# Patient Record
Sex: Male | Born: 1935 | Race: Black or African American | Hispanic: No | Marital: Married | Smoking: Former smoker
Health system: Southern US, Community
[De-identification: ages and names within clinical notes are randomized; demographics above are authoritative.]

## PROBLEM LIST (undated history)

## (undated) DIAGNOSIS — R531 Weakness: Secondary | ICD-10-CM

## (undated) DIAGNOSIS — M5137 Other intervertebral disc degeneration, lumbosacral region: Secondary | ICD-10-CM

## (undated) DIAGNOSIS — Z794 Long term (current) use of insulin: Secondary | ICD-10-CM

## (undated) DIAGNOSIS — H3500 Unspecified background retinopathy: Secondary | ICD-10-CM

## (undated) DIAGNOSIS — E119 Type 2 diabetes mellitus without complications: Secondary | ICD-10-CM

## (undated) DIAGNOSIS — M1990 Unspecified osteoarthritis, unspecified site: Secondary | ICD-10-CM

## (undated) DIAGNOSIS — I5022 Chronic systolic (congestive) heart failure: Secondary | ICD-10-CM

## (undated) DIAGNOSIS — M542 Cervicalgia: Secondary | ICD-10-CM

## (undated) DIAGNOSIS — J189 Pneumonia, unspecified organism: Secondary | ICD-10-CM

## (undated) DIAGNOSIS — I82409 Acute embolism and thrombosis of unspecified deep veins of unspecified lower extremity: Secondary | ICD-10-CM

## (undated) DIAGNOSIS — E7800 Pure hypercholesterolemia, unspecified: Secondary | ICD-10-CM

## (undated) DIAGNOSIS — F03B0 Unspecified dementia, moderate, without behavioral disturbance, psychotic disturbance, mood disturbance, and anxiety: Secondary | ICD-10-CM

## (undated) DIAGNOSIS — F0390 Unspecified dementia without behavioral disturbance: Secondary | ICD-10-CM

## (undated) DIAGNOSIS — I739 Peripheral vascular disease, unspecified: Secondary | ICD-10-CM

## (undated) DIAGNOSIS — I509 Heart failure, unspecified: Secondary | ICD-10-CM

## (undated) DIAGNOSIS — M51379 Other intervertebral disc degeneration, lumbosacral region without mention of lumbar back pain or lower extremity pain: Secondary | ICD-10-CM

## (undated) DIAGNOSIS — I10 Essential (primary) hypertension: Secondary | ICD-10-CM

## (undated) DIAGNOSIS — N189 Chronic kidney disease, unspecified: Secondary | ICD-10-CM

## (undated) DIAGNOSIS — H409 Unspecified glaucoma: Secondary | ICD-10-CM

## (undated) DIAGNOSIS — M48061 Spinal stenosis, lumbar region without neurogenic claudication: Secondary | ICD-10-CM

## (undated) DIAGNOSIS — M4800 Spinal stenosis, site unspecified: Secondary | ICD-10-CM

## (undated) DIAGNOSIS — G629 Polyneuropathy, unspecified: Secondary | ICD-10-CM

## (undated) HISTORY — DX: Heart failure, unspecified: I50.9

## (undated) HISTORY — DX: Pneumonia, unspecified organism: J18.9

## (undated) HISTORY — DX: Type 2 diabetes mellitus without complications: E11.9

## (undated) HISTORY — PX: OTHER SURGICAL HISTORY: SHX169

## (undated) HISTORY — DX: Pure hypercholesterolemia, unspecified: E78.00

## (undated) HISTORY — DX: Unspecified dementia, moderate, without behavioral disturbance, psychotic disturbance, mood disturbance, and anxiety: F03.B0

## (undated) HISTORY — DX: Spinal stenosis, lumbar region without neurogenic claudication: M48.061

## (undated) HISTORY — DX: Essential (primary) hypertension: I10

## (undated) HISTORY — DX: Unspecified dementia without behavioral disturbance: F03.90

---

## 1994-02-10 DIAGNOSIS — E119 Type 2 diabetes mellitus without complications: Secondary | ICD-10-CM

## 1994-02-10 HISTORY — DX: Type 2 diabetes mellitus without complications: E11.9

## 1997-02-10 HISTORY — PX: ANGIOPLASTY / STENTING FEMORAL: SUR30

## 2004-05-23 ENCOUNTER — Ambulatory Visit: Payer: Self-pay | Admitting: Internal Medicine

## 2004-05-23 HISTORY — PX: COLONOSCOPY: SHX174

## 2004-09-10 ENCOUNTER — Ambulatory Visit: Payer: Self-pay | Admitting: Otolaryngology

## 2004-11-21 ENCOUNTER — Ambulatory Visit: Payer: Self-pay | Admitting: Internal Medicine

## 2006-04-16 ENCOUNTER — Other Ambulatory Visit: Payer: Self-pay

## 2006-04-16 ENCOUNTER — Emergency Department: Payer: Self-pay | Admitting: Emergency Medicine

## 2008-12-08 ENCOUNTER — Ambulatory Visit: Payer: Self-pay | Admitting: Otolaryngology

## 2009-06-08 ENCOUNTER — Ambulatory Visit: Payer: Self-pay | Admitting: Neurology

## 2009-07-06 ENCOUNTER — Ambulatory Visit (HOSPITAL_COMMUNITY): Admission: RE | Admit: 2009-07-06 | Discharge: 2009-07-07 | Payer: Self-pay | Admitting: Neurosurgery

## 2009-07-11 HISTORY — PX: ANTERIOR FUSION CERVICAL SPINE: SUR626

## 2010-04-29 LAB — GLUCOSE, CAPILLARY
Glucose-Capillary: 129 mg/dL — ABNORMAL HIGH (ref 70–99)
Glucose-Capillary: 171 mg/dL — ABNORMAL HIGH (ref 70–99)
Glucose-Capillary: 66 mg/dL — ABNORMAL LOW (ref 70–99)

## 2010-04-29 LAB — SURGICAL PCR SCREEN
MRSA, PCR: NEGATIVE
Staphylococcus aureus: NEGATIVE

## 2010-04-29 LAB — CBC
HCT: 34.7 % — ABNORMAL LOW (ref 39.0–52.0)
Hemoglobin: 12.2 g/dL — ABNORMAL LOW (ref 13.0–17.0)
MCV: 91.8 fL (ref 78.0–100.0)
RDW: 14.2 % (ref 11.5–15.5)

## 2010-04-29 LAB — BASIC METABOLIC PANEL
BUN: 22 mg/dL (ref 6–23)
Calcium: 9.2 mg/dL (ref 8.4–10.5)
Creatinine, Ser: 1.68 mg/dL — ABNORMAL HIGH (ref 0.4–1.5)
GFR calc non Af Amer: 40 mL/min — ABNORMAL LOW (ref >60)
Sodium: 141 mEq/L (ref 135–145)

## 2010-07-04 ENCOUNTER — Encounter: Payer: Self-pay | Admitting: Neurology

## 2010-07-12 ENCOUNTER — Encounter: Payer: Self-pay | Admitting: Neurology

## 2010-08-11 ENCOUNTER — Encounter: Payer: Self-pay | Admitting: Neurology

## 2011-01-15 ENCOUNTER — Ambulatory Visit: Payer: Self-pay | Admitting: Neurology

## 2011-03-12 ENCOUNTER — Ambulatory Visit: Payer: Self-pay | Admitting: Family Medicine

## 2012-03-19 ENCOUNTER — Emergency Department: Payer: Self-pay | Admitting: Emergency Medicine

## 2012-03-19 LAB — CBC WITH DIFFERENTIAL/PLATELET
Basophil #: 0.1 10*3/uL (ref 0.0–0.1)
Eosinophil #: 0.1 10*3/uL (ref 0.0–0.7)
Eosinophil %: 1 %
Lymphocyte %: 25.7 %
MCHC: 34.3 g/dL (ref 32.0–36.0)
Neutrophil #: 4.3 10*3/uL (ref 1.4–6.5)
Neutrophil %: 64.3 %
RBC: 4.17 10*6/uL — ABNORMAL LOW (ref 4.40–5.90)

## 2012-03-19 LAB — URINALYSIS, COMPLETE
Bacteria: NONE SEEN
Bilirubin,UR: NEGATIVE
Blood: NEGATIVE
Glucose,UR: 50 mg/dL (ref 0–75)
Ketone: NEGATIVE
Leukocyte Esterase: NEGATIVE
Ph: 6 (ref 4.5–8.0)
RBC,UR: 1 /HPF (ref 0–5)

## 2012-03-19 LAB — TROPONIN I: Troponin-I: <0.02 ng/mL

## 2012-03-19 LAB — COMPREHENSIVE METABOLIC PANEL
Albumin: 3.4 g/dL (ref 3.4–5.0)
Alkaline Phosphatase: 98 U/L (ref 50–136)
Anion Gap: 7 (ref 7–16)
BUN: 26 mg/dL — ABNORMAL HIGH (ref 7–18)
Bilirubin,Total: 0.3 mg/dL (ref 0.2–1.0)
Chloride: 106 mmol/L (ref 98–107)
Creatinine: 1.79 mg/dL — ABNORMAL HIGH (ref 0.60–1.30)
EGFR (Non-African Amer.): 36 — ABNORMAL LOW
Osmolality: 284 (ref 275–301)
SGOT(AST): 47 U/L — ABNORMAL HIGH (ref 15–37)
SGPT (ALT): 67 U/L (ref 12–78)

## 2012-04-20 ENCOUNTER — Emergency Department: Payer: Self-pay | Admitting: Emergency Medicine

## 2012-04-20 LAB — URINALYSIS, COMPLETE
Bilirubin,UR: NEGATIVE
Glucose,UR: 150 mg/dL (ref 0–75)
Leukocyte Esterase: NEGATIVE
Specific Gravity: 1.021 (ref 1.003–1.030)
Squamous Epithelial: NONE SEEN

## 2012-04-20 LAB — COMPREHENSIVE METABOLIC PANEL
Bilirubin,Total: 0.4 mg/dL (ref 0.2–1.0)
Creatinine: 1.51 mg/dL — ABNORMAL HIGH (ref 0.60–1.30)
Glucose: 154 mg/dL — ABNORMAL HIGH (ref 65–99)
Sodium: 140 mmol/L (ref 136–145)
Total Protein: 7.3 g/dL (ref 6.4–8.2)

## 2012-04-20 LAB — TROPONIN I: Troponin-I: <0.02 ng/mL

## 2012-04-20 LAB — CBC
HGB: 12.6 g/dL — ABNORMAL LOW (ref 13.0–18.0)
Platelet: 129 10*3/uL — ABNORMAL LOW (ref 150–440)
RBC: 4.07 10*6/uL — ABNORMAL LOW (ref 4.40–5.90)

## 2012-04-20 LAB — TSH: Thyroid Stimulating Horm: 1.66 u[IU]/mL

## 2012-04-21 LAB — BASIC METABOLIC PANEL
BUN: 22 mg/dL — ABNORMAL HIGH (ref 7–18)
Co2: 25 mmol/L (ref 21–32)
Creatinine: 1.2 mg/dL (ref 0.60–1.30)
EGFR (Non-African Amer.): 58 — ABNORMAL LOW
Glucose: 36 mg/dL — CL (ref 65–99)
Osmolality: 276 (ref 275–301)
Potassium: 3.8 mmol/L (ref 3.5–5.1)

## 2012-04-21 LAB — CBC WITH DIFFERENTIAL/PLATELET
Basophil %: 0.2 %
Eosinophil #: 0 10*3/uL (ref 0.0–0.7)
Eosinophil %: 0.1 %
HCT: 38.6 % — ABNORMAL LOW (ref 40.0–52.0)
HGB: 13.3 g/dL (ref 13.0–18.0)
Lymphocyte #: 1.1 10*3/uL (ref 1.0–3.6)
Lymphocyte %: 9.5 %
MCV: 91 fL (ref 80–100)
Neutrophil %: 83.1 %
Platelet: 123 10*3/uL — ABNORMAL LOW (ref 150–440)
RBC: 4.24 10*6/uL — ABNORMAL LOW (ref 4.40–5.90)
RDW: 14.5 % (ref 11.5–14.5)
WBC: 11.7 10*3/uL — ABNORMAL HIGH (ref 3.8–10.6)

## 2012-04-22 ENCOUNTER — Inpatient Hospital Stay: Payer: Self-pay | Admitting: Family Medicine

## 2012-04-22 LAB — CBC WITH DIFFERENTIAL/PLATELET
Basophil %: 0.1 %
Eosinophil #: 0 10*3/uL (ref 0.0–0.7)
HGB: 10.9 g/dL — ABNORMAL LOW (ref 13.0–18.0)
Lymphocyte %: 13.6 %
MCV: 91 fL (ref 80–100)
Monocyte #: 0.7 x10 3/mm (ref 0.2–1.0)
Neutrophil #: 6.1 10*3/uL (ref 1.4–6.5)
Platelet: 100 10*3/uL — ABNORMAL LOW (ref 150–440)
WBC: 7.9 10*3/uL (ref 3.8–10.6)

## 2012-04-22 LAB — BASIC METABOLIC PANEL
BUN: 31 mg/dL — ABNORMAL HIGH (ref 7–18)
Calcium, Total: 8.2 mg/dL — ABNORMAL LOW (ref 8.5–10.1)
EGFR (African American): 53 — ABNORMAL LOW
EGFR (Non-African Amer.): 46 — ABNORMAL LOW
Glucose: 75 mg/dL (ref 65–99)
Osmolality: 286 (ref 275–301)

## 2012-04-23 LAB — CBC WITH DIFFERENTIAL/PLATELET
Basophil #: 0 10*3/uL (ref 0.0–0.1)
HCT: 39.7 % — ABNORMAL LOW (ref 40.0–52.0)
Lymphocyte #: 0.4 10*3/uL — ABNORMAL LOW (ref 1.0–3.6)
MCH: 31.3 pg (ref 26.0–34.0)
Monocyte #: 0.3 x10 3/mm (ref 0.2–1.0)
Neutrophil %: 84.8 %
Platelet: 134 10*3/uL — ABNORMAL LOW (ref 150–440)
RDW: 14.5 % (ref 11.5–14.5)
WBC: 5 10*3/uL (ref 3.8–10.6)

## 2012-04-23 LAB — BASIC METABOLIC PANEL
Anion Gap: 7 (ref 7–16)
BUN: 22 mg/dL — ABNORMAL HIGH (ref 7–18)
Creatinine: 1.25 mg/dL (ref 0.60–1.30)
EGFR (Non-African Amer.): 55 — ABNORMAL LOW
Glucose: 168 mg/dL — ABNORMAL HIGH (ref 65–99)
Sodium: 139 mmol/L (ref 136–145)

## 2012-04-24 LAB — CBC WITH DIFFERENTIAL/PLATELET
Eosinophil %: 0 %
Lymphocyte %: 7.9 %
MCH: 30.8 pg (ref 26.0–34.0)
Monocyte %: 7.1 %
Neutrophil #: 12.6 10*3/uL — ABNORMAL HIGH (ref 1.4–6.5)
Platelet: 147 10*3/uL — ABNORMAL LOW (ref 150–440)
WBC: 14.8 10*3/uL — ABNORMAL HIGH (ref 3.8–10.6)

## 2012-04-24 LAB — BASIC METABOLIC PANEL
Anion Gap: 5 — ABNORMAL LOW (ref 7–16)
BUN: 37 mg/dL — ABNORMAL HIGH (ref 7–18)
Calcium, Total: 8.5 mg/dL (ref 8.5–10.1)
Chloride: 107 mmol/L (ref 98–107)
Co2: 27 mmol/L (ref 21–32)
Creatinine: 1.69 mg/dL — ABNORMAL HIGH (ref 0.60–1.30)
EGFR (African American): 44 — ABNORMAL LOW
EGFR (Non-African Amer.): 38 — ABNORMAL LOW

## 2012-04-25 LAB — CBC WITH DIFFERENTIAL/PLATELET
Basophil #: 0.1 10*3/uL (ref 0.0–0.1)
Basophil %: 0.4 %
Lymphocyte #: 1 10*3/uL (ref 1.0–3.6)
Lymphocyte %: 6.1 %
MCHC: 34 g/dL (ref 32.0–36.0)
Monocyte #: 0.9 x10 3/mm (ref 0.2–1.0)
Monocyte %: 5.6 %
Neutrophil #: 13.9 10*3/uL — ABNORMAL HIGH (ref 1.4–6.5)
Platelet: 184 10*3/uL (ref 150–440)
WBC: 15.9 10*3/uL — ABNORMAL HIGH (ref 3.8–10.6)

## 2012-04-25 LAB — BASIC METABOLIC PANEL
Anion Gap: 6 — ABNORMAL LOW (ref 7–16)
Calcium, Total: 9 mg/dL (ref 8.5–10.1)
Chloride: 106 mmol/L (ref 98–107)
Co2: 26 mmol/L (ref 21–32)
EGFR (Non-African Amer.): 45 — ABNORMAL LOW
Glucose: 185 mg/dL — ABNORMAL HIGH (ref 65–99)
Potassium: 3.6 mmol/L (ref 3.5–5.1)

## 2012-04-26 LAB — CULTURE, BLOOD (SINGLE)

## 2012-04-27 LAB — BASIC METABOLIC PANEL
Calcium, Total: 8.8 mg/dL (ref 8.5–10.1)
Co2: 26 mmol/L (ref 21–32)
EGFR (African American): 52 — ABNORMAL LOW
EGFR (Non-African Amer.): 45 — ABNORMAL LOW
Potassium: 3.3 mmol/L — ABNORMAL LOW (ref 3.5–5.1)
Sodium: 143 mmol/L (ref 136–145)

## 2012-04-27 LAB — CBC WITH DIFFERENTIAL/PLATELET
Eosinophil #: 0.1 10*3/uL (ref 0.0–0.7)
HCT: 30.6 % — ABNORMAL LOW (ref 40.0–52.0)
HGB: 9.7 g/dL — ABNORMAL LOW (ref 13.0–18.0)
Lymphocyte #: 1.7 10*3/uL (ref 1.0–3.6)
Lymphocyte %: 16.7 %
Monocyte #: 1.3 x10 3/mm — ABNORMAL HIGH (ref 0.2–1.0)
Monocyte %: 12.2 %
Neutrophil %: 69.8 %
Platelet: 250 10*3/uL (ref 150–440)

## 2012-04-28 LAB — BASIC METABOLIC PANEL
Anion Gap: 9 (ref 7–16)
BUN: 32 mg/dL — ABNORMAL HIGH (ref 7–18)
Chloride: 107 mmol/L (ref 98–107)
Co2: 25 mmol/L (ref 21–32)
Creatinine: 1.39 mg/dL — ABNORMAL HIGH (ref 0.60–1.30)
Glucose: 88 mg/dL (ref 65–99)
Osmolality: 288 (ref 275–301)
Potassium: 3.2 mmol/L — ABNORMAL LOW (ref 3.5–5.1)
Sodium: 141 mmol/L (ref 136–145)

## 2012-04-28 LAB — CBC WITH DIFFERENTIAL/PLATELET
Basophil #: 0.1 10*3/uL (ref 0.0–0.1)
Basophil %: 0.5 %
HGB: 11 g/dL — ABNORMAL LOW (ref 13.0–18.0)
Monocyte %: 9.5 %
Platelet: 285 10*3/uL (ref 150–440)
RBC: 3.45 10*6/uL — ABNORMAL LOW (ref 4.40–5.90)
RDW: 14.5 % (ref 11.5–14.5)

## 2012-04-28 LAB — URINE CULTURE

## 2012-04-29 ENCOUNTER — Encounter: Payer: Self-pay | Admitting: Internal Medicine

## 2012-05-11 ENCOUNTER — Encounter: Payer: Self-pay | Admitting: Internal Medicine

## 2013-03-16 ENCOUNTER — Ambulatory Visit: Payer: Self-pay | Admitting: Ophthalmology

## 2013-04-20 ENCOUNTER — Ambulatory Visit: Payer: Self-pay | Admitting: Ophthalmology

## 2013-06-17 ENCOUNTER — Ambulatory Visit: Payer: Self-pay

## 2013-07-26 ENCOUNTER — Ambulatory Visit: Payer: Self-pay

## 2013-08-10 ENCOUNTER — Ambulatory Visit: Payer: Self-pay

## 2013-09-10 ENCOUNTER — Inpatient Hospital Stay: Payer: Self-pay | Admitting: Family Medicine

## 2013-09-10 LAB — URINALYSIS, COMPLETE
Bacteria: NONE SEEN
Bilirubin,UR: NEGATIVE
Glucose,UR: >=500 mg/dL (ref 0–75)
Ketone: NEGATIVE
LEUKOCYTE ESTERASE: NEGATIVE
NITRITE: NEGATIVE
PH: 5 (ref 4.5–8.0)
Protein: 100
Specific Gravity: 1.016 (ref 1.003–1.030)
Squamous Epithelial: NONE SEEN

## 2013-09-10 LAB — CK TOTAL AND CKMB (NOT AT ARMC)
CK, Total: 181 U/L
CK-MB: 1.7 ng/mL (ref 0.5–3.6)

## 2013-09-10 LAB — COMPREHENSIVE METABOLIC PANEL
ANION GAP: 4 — AB (ref 7–16)
Albumin: 3.1 g/dL — ABNORMAL LOW (ref 3.4–5.0)
Alkaline Phosphatase: 71 U/L
BUN: 25 mg/dL — AB (ref 7–18)
Bilirubin,Total: 0.4 mg/dL (ref 0.2–1.0)
CO2: 26 mmol/L (ref 21–32)
CREATININE: 2.11 mg/dL — AB (ref 0.60–1.30)
Calcium, Total: 9.1 mg/dL (ref 8.5–10.1)
Chloride: 107 mmol/L (ref 98–107)
EGFR (African American): 33 — ABNORMAL LOW
GFR CALC NON AF AMER: 29 — AB
Glucose: 277 mg/dL — ABNORMAL HIGH (ref 65–99)
Osmolality: 288 (ref 275–301)
Potassium: 5 mmol/L (ref 3.5–5.1)
SGOT(AST): 34 U/L (ref 15–37)
SGPT (ALT): 35 U/L
Sodium: 137 mmol/L (ref 136–145)
TOTAL PROTEIN: 7.4 g/dL (ref 6.4–8.2)

## 2013-09-10 LAB — CBC
HCT: 35.3 % — ABNORMAL LOW (ref 40.0–52.0)
HGB: 11.8 g/dL — AB (ref 13.0–18.0)
MCH: 31.2 pg (ref 26.0–34.0)
MCHC: 33.3 g/dL (ref 32.0–36.0)
MCV: 94 fL (ref 80–100)
Platelet: 186 10*3/uL (ref 150–440)
RBC: 3.77 10*6/uL — ABNORMAL LOW (ref 4.40–5.90)
RDW: 14.2 % (ref 11.5–14.5)
WBC: 8.7 10*3/uL (ref 3.8–10.6)

## 2013-09-10 LAB — PRO B NATRIURETIC PEPTIDE: B-TYPE NATIURETIC PEPTID: 229 pg/mL (ref 0–450)

## 2013-09-10 LAB — PROTIME-INR
INR: 2.3
Prothrombin Time: 24.4 secs — ABNORMAL HIGH (ref 11.5–14.7)

## 2013-09-10 LAB — PHOSPHORUS: PHOSPHORUS: 2 mg/dL — AB (ref 2.5–4.9)

## 2013-09-10 LAB — TROPONIN I: Troponin-I: <0.02 ng/mL

## 2013-09-10 LAB — MAGNESIUM: Magnesium: 2 mg/dL

## 2013-09-11 LAB — BASIC METABOLIC PANEL
ANION GAP: 8 (ref 7–16)
BUN: 29 mg/dL — ABNORMAL HIGH (ref 7–18)
CALCIUM: 8 mg/dL — AB (ref 8.5–10.1)
CO2: 24 mmol/L (ref 21–32)
Chloride: 106 mmol/L (ref 98–107)
Creatinine: 2 mg/dL — ABNORMAL HIGH (ref 0.60–1.30)
EGFR (African American): 35 — ABNORMAL LOW
GFR CALC NON AF AMER: 31 — AB
Glucose: 257 mg/dL — ABNORMAL HIGH (ref 65–99)
Osmolality: 290 (ref 275–301)
Potassium: 4.6 mmol/L (ref 3.5–5.1)
SODIUM: 138 mmol/L (ref 136–145)

## 2013-09-11 LAB — CBC WITH DIFFERENTIAL/PLATELET
BASOS PCT: 0.3 %
Basophil #: 0 10*3/uL (ref 0.0–0.1)
Eosinophil #: 0 10*3/uL (ref 0.0–0.7)
Eosinophil %: 0 %
HCT: 29.6 % — AB (ref 40.0–52.0)
HGB: 10.1 g/dL — AB (ref 13.0–18.0)
LYMPHS ABS: 0.6 10*3/uL — AB (ref 1.0–3.6)
LYMPHS PCT: 6.7 %
MCH: 31.8 pg (ref 26.0–34.0)
MCHC: 34 g/dL (ref 32.0–36.0)
MCV: 94 fL (ref 80–100)
MONO ABS: 0.7 x10 3/mm (ref 0.2–1.0)
MONOS PCT: 7.8 %
NEUTROS PCT: 85.2 %
Neutrophil #: 7.5 10*3/uL — ABNORMAL HIGH (ref 1.4–6.5)
Platelet: 146 10*3/uL — ABNORMAL LOW (ref 150–440)
RBC: 3.16 10*6/uL — ABNORMAL LOW (ref 4.40–5.90)
RDW: 14.3 % (ref 11.5–14.5)
WBC: 8.8 10*3/uL (ref 3.8–10.6)

## 2013-09-11 LAB — PROTIME-INR
INR: 1.7
Prothrombin Time: 19.8 secs — ABNORMAL HIGH (ref 11.5–14.7)

## 2013-09-12 LAB — CBC WITH DIFFERENTIAL/PLATELET
Basophil #: 0 10*3/uL (ref 0.0–0.1)
Basophil %: 0.4 %
Eosinophil #: 0.1 10*3/uL (ref 0.0–0.7)
Eosinophil %: 0.5 %
HCT: 29.5 % — AB (ref 40.0–52.0)
HGB: 10 g/dL — ABNORMAL LOW (ref 13.0–18.0)
Lymphocyte #: 1.1 10*3/uL (ref 1.0–3.6)
Lymphocyte %: 9.9 %
MCH: 31.4 pg (ref 26.0–34.0)
MCHC: 34 g/dL (ref 32.0–36.0)
MCV: 93 fL (ref 80–100)
Monocyte #: 0.9 x10 3/mm (ref 0.2–1.0)
Monocyte %: 7.8 %
NEUTROS ABS: 9.2 10*3/uL — AB (ref 1.4–6.5)
Neutrophil %: 81.4 %
Platelet: 153 10*3/uL (ref 150–440)
RBC: 3.19 10*6/uL — ABNORMAL LOW (ref 4.40–5.90)
RDW: 14.4 % (ref 11.5–14.5)
WBC: 11.4 10*3/uL — ABNORMAL HIGH (ref 3.8–10.6)

## 2013-09-12 LAB — BASIC METABOLIC PANEL
ANION GAP: 5 — AB (ref 7–16)
BUN: 27 mg/dL — ABNORMAL HIGH (ref 7–18)
CALCIUM: 8.5 mg/dL (ref 8.5–10.1)
CHLORIDE: 110 mmol/L — AB (ref 98–107)
CREATININE: 1.8 mg/dL — AB (ref 0.60–1.30)
Co2: 26 mmol/L (ref 21–32)
EGFR (African American): 40 — ABNORMAL LOW
GFR CALC NON AF AMER: 35 — AB
GLUCOSE: 41 mg/dL — AB (ref 65–99)
Osmolality: 283 (ref 275–301)
Potassium: 3.7 mmol/L (ref 3.5–5.1)
SODIUM: 141 mmol/L (ref 136–145)

## 2013-09-12 LAB — URINE CULTURE

## 2013-09-12 LAB — PROTIME-INR
INR: 2.1
Prothrombin Time: 23.3 secs — ABNORMAL HIGH (ref 11.5–14.7)

## 2013-09-13 LAB — PROTIME-INR
INR: 1.7
PROTHROMBIN TIME: 19.3 s — AB (ref 11.5–14.7)

## 2013-09-14 LAB — BASIC METABOLIC PANEL
Anion Gap: 9 (ref 7–16)
BUN: 30 mg/dL — AB (ref 7–18)
CALCIUM: 8.8 mg/dL (ref 8.5–10.1)
Chloride: 105 mmol/L (ref 98–107)
Co2: 28 mmol/L (ref 21–32)
Creatinine: 1.71 mg/dL — ABNORMAL HIGH (ref 0.60–1.30)
EGFR (African American): 43 — ABNORMAL LOW
GFR CALC NON AF AMER: 37 — AB
Glucose: 69 mg/dL (ref 65–99)
Osmolality: 288 (ref 275–301)
Potassium: 3.1 mmol/L — ABNORMAL LOW (ref 3.5–5.1)
SODIUM: 142 mmol/L (ref 136–145)

## 2013-09-15 LAB — BASIC METABOLIC PANEL
ANION GAP: 6 — AB (ref 7–16)
BUN: 37 mg/dL — ABNORMAL HIGH (ref 7–18)
Calcium, Total: 8.7 mg/dL (ref 8.5–10.1)
Chloride: 106 mmol/L (ref 98–107)
Co2: 29 mmol/L (ref 21–32)
Creatinine: 1.8 mg/dL — ABNORMAL HIGH (ref 0.60–1.30)
EGFR (African American): 40 — ABNORMAL LOW
GFR CALC NON AF AMER: 35 — AB
Glucose: 189 mg/dL — ABNORMAL HIGH (ref 65–99)
OSMOLALITY: 295 (ref 275–301)
Potassium: 3.3 mmol/L — ABNORMAL LOW (ref 3.5–5.1)
SODIUM: 141 mmol/L (ref 136–145)

## 2013-09-15 LAB — CULTURE, BLOOD (SINGLE)

## 2013-09-15 LAB — EXPECTORATED SPUTUM ASSESSMENT W REFEX TO RESP CULTURE

## 2013-09-16 LAB — HEMOGLOBIN: HGB: 11.1 g/dL — ABNORMAL LOW (ref 13.0–18.0)

## 2013-09-24 ENCOUNTER — Emergency Department: Payer: Self-pay | Admitting: Emergency Medicine

## 2013-09-24 LAB — URINALYSIS, COMPLETE
BACTERIA: NONE SEEN
Bilirubin,UR: NEGATIVE
Glucose,UR: NEGATIVE mg/dL (ref 0–75)
Hyaline Cast: 2
KETONE: NEGATIVE
LEUKOCYTE ESTERASE: NEGATIVE
NITRITE: NEGATIVE
Ph: 5 (ref 4.5–8.0)
RBC,UR: 2 /HPF (ref 0–5)
SQUAMOUS EPITHELIAL: NONE SEEN
Specific Gravity: 1.013 (ref 1.003–1.030)
WBC UR: 1 /HPF (ref 0–5)

## 2013-09-24 LAB — COMPREHENSIVE METABOLIC PANEL
ALBUMIN: 3 g/dL — AB (ref 3.4–5.0)
ALK PHOS: 71 U/L
AST: 24 U/L (ref 15–37)
Anion Gap: 6 — ABNORMAL LOW (ref 7–16)
BILIRUBIN TOTAL: 0.6 mg/dL (ref 0.2–1.0)
BUN: 33 mg/dL — ABNORMAL HIGH (ref 7–18)
CHLORIDE: 104 mmol/L (ref 98–107)
CREATININE: 1.76 mg/dL — AB (ref 0.60–1.30)
Calcium, Total: 8.7 mg/dL (ref 8.5–10.1)
Co2: 28 mmol/L (ref 21–32)
EGFR (African American): 41 — ABNORMAL LOW
EGFR (Non-African Amer.): 36 — ABNORMAL LOW
Glucose: 149 mg/dL — ABNORMAL HIGH (ref 65–99)
Osmolality: 286 (ref 275–301)
Potassium: 3.8 mmol/L (ref 3.5–5.1)
SGPT (ALT): 31 U/L
SODIUM: 138 mmol/L (ref 136–145)
Total Protein: 7.6 g/dL (ref 6.4–8.2)

## 2013-09-24 LAB — TROPONIN I: Troponin-I: 0.02 ng/mL

## 2013-09-24 LAB — CBC
HCT: 42.4 % (ref 40.0–52.0)
HGB: 13.9 g/dL (ref 13.0–18.0)
MCH: 30.6 pg (ref 26.0–34.0)
MCHC: 32.7 g/dL (ref 32.0–36.0)
MCV: 94 fL (ref 80–100)
PLATELETS: 391 10*3/uL (ref 150–440)
RBC: 4.53 10*6/uL (ref 4.40–5.90)
RDW: 14.3 % (ref 11.5–14.5)
WBC: 10.5 10*3/uL (ref 3.8–10.6)

## 2014-02-22 ENCOUNTER — Inpatient Hospital Stay: Payer: Self-pay | Admitting: Internal Medicine

## 2014-02-22 LAB — URINALYSIS, COMPLETE
BACTERIA: NONE SEEN
BILIRUBIN, UR: NEGATIVE
Blood: NEGATIVE
Ketone: NEGATIVE
Leukocyte Esterase: NEGATIVE
NITRITE: NEGATIVE
Ph: 5 (ref 4.5–8.0)
Protein: 100
RBC,UR: 1 /HPF (ref 0–5)
SQUAMOUS EPITHELIAL: NONE SEEN
Specific Gravity: 1.017 (ref 1.003–1.030)

## 2014-02-22 LAB — CBC WITH DIFFERENTIAL/PLATELET
BASOS ABS: 0 10*3/uL (ref 0.0–0.1)
BASOS PCT: 0.2 %
EOS ABS: 0 10*3/uL (ref 0.0–0.7)
EOS PCT: 0.1 %
HCT: 34.4 % — AB (ref 40.0–52.0)
HGB: 11.2 g/dL — AB (ref 13.0–18.0)
Lymphocyte #: 0.6 10*3/uL — ABNORMAL LOW (ref 1.0–3.6)
Lymphocyte %: 4.1 %
MCH: 30.1 pg (ref 26.0–34.0)
MCHC: 32.6 g/dL (ref 32.0–36.0)
MCV: 92 fL (ref 80–100)
MONOS PCT: 8.4 %
Monocyte #: 1.2 x10 3/mm — ABNORMAL HIGH (ref 0.2–1.0)
Neutrophil #: 12.3 10*3/uL — ABNORMAL HIGH (ref 1.4–6.5)
Neutrophil %: 87.2 %
Platelet: 202 10*3/uL (ref 150–440)
RBC: 3.72 10*6/uL — ABNORMAL LOW (ref 4.40–5.90)
RDW: 14.2 % (ref 11.5–14.5)
WBC: 14 10*3/uL — ABNORMAL HIGH (ref 3.8–10.6)

## 2014-02-22 LAB — BASIC METABOLIC PANEL
ANION GAP: 5 — AB (ref 7–16)
BUN: 26 mg/dL — ABNORMAL HIGH (ref 7–18)
CO2: 28 mmol/L (ref 21–32)
Calcium, Total: 8.7 mg/dL (ref 8.5–10.1)
Chloride: 106 mmol/L (ref 98–107)
Creatinine: 2.04 mg/dL — ABNORMAL HIGH (ref 0.60–1.30)
GFR CALC AF AMER: 41 — AB
GFR CALC NON AF AMER: 34 — AB
Glucose: 141 mg/dL — ABNORMAL HIGH (ref 65–99)
OSMOLALITY: 285 (ref 275–301)
Potassium: 4.1 mmol/L (ref 3.5–5.1)
Sodium: 139 mmol/L (ref 136–145)

## 2014-02-23 LAB — BASIC METABOLIC PANEL
Anion Gap: 5 — ABNORMAL LOW (ref 7–16)
BUN: 26 mg/dL — AB (ref 7–18)
Calcium, Total: 8.1 mg/dL — ABNORMAL LOW (ref 8.5–10.1)
Chloride: 108 mmol/L — ABNORMAL HIGH (ref 98–107)
Co2: 28 mmol/L (ref 21–32)
Creatinine: 1.76 mg/dL — ABNORMAL HIGH (ref 0.60–1.30)
EGFR (Non-African Amer.): 40 — ABNORMAL LOW
GFR CALC AF AMER: 48 — AB
GLUCOSE: 97 mg/dL (ref 65–99)
OSMOLALITY: 286 (ref 275–301)
Potassium: 3.8 mmol/L (ref 3.5–5.1)
Sodium: 141 mmol/L (ref 136–145)

## 2014-02-23 LAB — CBC WITH DIFFERENTIAL/PLATELET
BASOS PCT: 0.1 %
Basophil #: 0 10*3/uL (ref 0.0–0.1)
Eosinophil #: 0 10*3/uL (ref 0.0–0.7)
Eosinophil %: 0.4 %
HCT: 28.3 % — ABNORMAL LOW (ref 40.0–52.0)
HGB: 9.4 g/dL — AB (ref 13.0–18.0)
Lymphocyte #: 1.2 10*3/uL (ref 1.0–3.6)
Lymphocyte %: 10.1 %
MCH: 30.6 pg (ref 26.0–34.0)
MCHC: 33.1 g/dL (ref 32.0–36.0)
MCV: 92 fL (ref 80–100)
MONOS PCT: 5.7 %
Monocyte #: 0.6 x10 3/mm (ref 0.2–1.0)
Neutrophil #: 9.6 10*3/uL — ABNORMAL HIGH (ref 1.4–6.5)
Neutrophil %: 83.7 %
PLATELETS: 163 10*3/uL (ref 150–440)
RBC: 3.07 10*6/uL — ABNORMAL LOW (ref 4.40–5.90)
RDW: 14.6 % — ABNORMAL HIGH (ref 11.5–14.5)
WBC: 11.4 10*3/uL — ABNORMAL HIGH (ref 3.8–10.6)

## 2014-02-24 LAB — BASIC METABOLIC PANEL
Anion Gap: 6 — ABNORMAL LOW (ref 7–16)
BUN: 23 mg/dL — ABNORMAL HIGH (ref 7–18)
CALCIUM: 8.4 mg/dL — AB (ref 8.5–10.1)
Chloride: 109 mmol/L — ABNORMAL HIGH (ref 98–107)
Co2: 27 mmol/L (ref 21–32)
Creatinine: 1.59 mg/dL — ABNORMAL HIGH (ref 0.60–1.30)
EGFR (African American): 54 — ABNORMAL LOW
EGFR (Non-African Amer.): 45 — ABNORMAL LOW
Glucose: 78 mg/dL (ref 65–99)
OSMOLALITY: 286 (ref 275–301)
POTASSIUM: 4 mmol/L (ref 3.5–5.1)
SODIUM: 142 mmol/L (ref 136–145)

## 2014-02-24 LAB — CBC WITH DIFFERENTIAL/PLATELET
BASOS ABS: 0 10*3/uL (ref 0.0–0.1)
Basophil %: 0.3 %
EOS ABS: 0.1 10*3/uL (ref 0.0–0.7)
Eosinophil %: 1 %
HCT: 29.9 % — AB (ref 40.0–52.0)
HGB: 10.1 g/dL — ABNORMAL LOW (ref 13.0–18.0)
Lymphocyte #: 0.9 10*3/uL — ABNORMAL LOW (ref 1.0–3.6)
Lymphocyte %: 8.4 %
MCH: 30.9 pg (ref 26.0–34.0)
MCHC: 33.9 g/dL (ref 32.0–36.0)
MCV: 91 fL (ref 80–100)
MONOS PCT: 6.4 %
Monocyte #: 0.7 x10 3/mm (ref 0.2–1.0)
NEUTROS PCT: 83.9 %
Neutrophil #: 8.6 10*3/uL — ABNORMAL HIGH (ref 1.4–6.5)
PLATELETS: 177 10*3/uL (ref 150–440)
RBC: 3.28 10*6/uL — ABNORMAL LOW (ref 4.40–5.90)
RDW: 14.5 % (ref 11.5–14.5)
WBC: 10.2 10*3/uL (ref 3.8–10.6)

## 2014-02-25 LAB — BASIC METABOLIC PANEL
ANION GAP: 7 (ref 7–16)
BUN: 20 mg/dL — AB (ref 7–18)
CHLORIDE: 109 mmol/L — AB (ref 98–107)
CO2: 27 mmol/L (ref 21–32)
CREATININE: 1.51 mg/dL — AB (ref 0.60–1.30)
Calcium, Total: 8.6 mg/dL (ref 8.5–10.1)
EGFR (African American): 58 — ABNORMAL LOW
EGFR (Non-African Amer.): 47 — ABNORMAL LOW
Glucose: 62 mg/dL — ABNORMAL LOW (ref 65–99)
OSMOLALITY: 286 (ref 275–301)
Potassium: 3.8 mmol/L (ref 3.5–5.1)
Sodium: 143 mmol/L (ref 136–145)

## 2014-02-26 ENCOUNTER — Encounter: Payer: Self-pay | Admitting: Internal Medicine

## 2014-02-27 LAB — CULTURE, BLOOD (SINGLE)

## 2014-03-13 ENCOUNTER — Encounter: Payer: Self-pay | Admitting: Internal Medicine

## 2014-04-01 DIAGNOSIS — J189 Pneumonia, unspecified organism: Secondary | ICD-10-CM

## 2014-04-01 HISTORY — DX: Pneumonia, unspecified organism: J18.9

## 2014-04-11 ENCOUNTER — Encounter
Admit: 2014-04-11 | Disposition: A | Discharge status: Home / Self Care | Payer: Self-pay | Attending: Internal Medicine | Admitting: Internal Medicine

## 2014-05-12 ENCOUNTER — Encounter
Admit: 2014-05-12 | Disposition: A | Discharge status: Home / Self Care | Payer: Self-pay | Attending: Internal Medicine | Admitting: Internal Medicine

## 2014-06-02 NOTE — Consult Note (Signed)
Chief Complaint and History:  Referring Physician Dr. Burnadette Byrd   Chief Complaint Uncontrolled diabetes with hypoglycemia   Allergies:  No Known Allergies:   Assessment/Plan:  Assessment/Plan [name] is a 81 yo Byrd with multiple medical problems, well known to me. He has stg 3 CKD, HTN, h/o CVA, DM type 2, and NPH. Pt was admitted on 3/11 from Neuro clinic with inability to walk and fever. Due to respiratory distress, he has been in the ICU. He was seen, interviewed, and examined. Outpt diabetes regimen is Lantus 14 units qHS and Humalog qAC SSI of approximately 5 units per 50 over a target of 180. On 3/11 he received Lantus 100 units at 9p and he had nocturnal severe hypoglycemia with a BG of 37. On 3/12 Lantus was held, however he received Humalog 20 units at 5 PM and then 12 units at [facility] and he had a low the next morning of 54. On 3/13 he received Humalog 20 units at 5 PM and he had severe nocturnal hypoglycemia with sugars in the 20-30s last night.  A/ Uncontrolled diabetes with severe hypoglycemia  P/ Resume outpt regimen. Will give Lantus 14 units tonight, NovoLog 6 units tid AC plus a modified insulin sliding scale qAC. Will DC bedtime sliding scale.  I dictate a full consult.   Electronic Signatures: William Byrd, William Byrd (MD)  (Signed 14-Mar-14 16:40)  Authored: Chief Complaint and History, ALLERGIES, Assessment/Plan   Last Updated: 14-Mar-14 16:40 by William Byrd, William Byrd (MD)

## 2014-06-02 NOTE — Discharge Summary (Signed)
PATIENT NAME:  William Byrd, William Byrd MR#:  [id number] DATE OF BIRTH:  [birth year]  DATE OF ADMISSION:  04/22/2012 DATE OF DISCHARGE:  04/28/2012  DISCHARGE DIAGNOSES: 1.  Bilateral hospital-acquired pneumonia.  2.  Insulin-dependent diabetes.  3.  Hypertension.  4.  Generalized weakness and instability.  5.  Hyperlipidemia.  6.  Moderate dementia.  7.  Chronic kidney disease with baseline creatinine of 1.3.  8.  Cervical myelopathy.   DISCHARGE MEDICATIONS: 1.  Vytorin 10/40 one tab p.o. daily.  2.  Donepezil 10 mg p.o. at bedtime.  3.  Finasteride 5 mg p.o. daily.  4.  Acetaminophen 325 mg tablets 2 tabs p.o. q.4 h. as needed for pain and fever.  5.  Enalapril 10 mg p.o. daily.  6.  Duloxetine 60 mg p.o. at bedtime.  7.  Lantus 40 units at bedtime.  8.  Sliding scale insulin 1 unit for fasting blood sugar 151 to 200, 3 units for sugars 201 to 250,  5 units for sugars 251 to 300, 7 units for sugars 301 to 350, and 10 units for sugars 351 to 400.  9.  Insulin Aspart 40 units subcutaneous injections t.i.d. with meals.  10. Aggrenox 25/200 one capsule p.o. b.i.d.  11.  Memantine 10 mg p.o. b.i.d.  12.  Timolol ophthalmic 1 drop each affected eye twice a day.  13.  Moxifloxacin 400 mg p.o. daily to continue for an additional week.   CONSULT:  Neurology. The patient was also consulted by Endocrinology per Dr. Tedd Sias.   PROCEDURES:  The patient underwent MRI of the brain which showed ventricles that were mildly prominent in size but no acute process. Also had a chest x-ray that was consistent with right middle and left lower lobe opacities.   BRIEF HOSPITAL COURSE:  On the day of discharge sodium 141, potassium 3.2, creatinine 1.39, glucose of 88, hemoglobin of 11, white blood cell count 11.2, and platelets of 285.   The patient also had an ultrasound that did show a nonocclusive thrombus in the right popliteal vein.   BRIEF HOSPITAL COURSE:  1.  Lower extremity weakness, ataxia:  The patient  initially came in with difficulty walking with lower extremity weakness. It was thought that this was likely due to poor conditioning and did not seem to be associated with acute infection. No signs of other neurological deficits associated with this. Was evaluated by Neurology. No further work-up needed.  2.  Hospital-acquired pneumonia:  The patient was noted to have bilateral pneumonia on chest  x-ray on 03/14. He was started on Zosyn and vanc and was transitioned over to Avelox. He will finish another 7 days of this antibiotic. The patient was noted to be at increased risk for aspiration. He was evaluated by Speech Therapy who recommended a mechanical soft diet, nectar-thick liquids.  3.  Insulin-dependent diabetes:  The patient has been noncompliant at home. He was evaluated by Endocrinology. His blood sugars have become more stable since evaluated by Dr. Tedd Sias. Will continue on the current regimen.  4.  Hypertension:  Will stop the amlodipine while he is on the simvastatin. Will continue with  enalapril 10 mg daily. His blood pressure is stable at this time.  5.  Moderate dementia; remains stable:  Will continue with his home regimen.  6.  Chronic kidney disease:  Unchanged. Creatinine of 1.3. Has been followed by Dr. Thedore Mins in the past. No further workup at this time.  7.  Nonocclusive thrombus:  The patient was noted to  have a nonocclusive thrombus in the right popliteal vein on ultrasound. Given his risk of falls and his poor compliance will not put him on any anticoagulation therapy besides the Aggrenox at this time. He is not a great candidate for Coumadin.   DISPOSITION: He is in stable condition [facility] and OT. Will also need to continue O2 per nasal cannula to keep sats above 92%. He is to follow with Dr. Burnadette PopLinthavong in 1 week after [facility].     ____________________________ Marisue IvanKanhka Tariana Moldovan, MD kl:dm D: 04/28/2012  12:45:00 ET T: 04/28/2012 12:54:29 ET JOB#: [id number]  cc: Marisue IvanKanhka Shaune Malacara, MD, <Dictator> Marisue IvanKANHKA Reace Breshears MD ELECTRONICALLY SIGNED 05/21/2012 10:00

## 2014-06-02 NOTE — H&P (Signed)
DATE OF BIRTH:  [birth year]  DATE OF ADMISSION:  04/20/2012  PRIMARY CARE PHYSICIAN:  Dr. Burnadette Pop  PRIMARY NEUROLOGIST:  Dr. Sherryll Burger  CHIEF COMPLAINT: Lower extremity weakness and fever.   HISTORY OF PRESENT  ILLNESS:  A 81 year old African-American male patient with history of hypertension, diabetes mellitus, CKD stage III, NPH, and cervical myelopathy,  sent to the hospital as a direct admit from neurologist, Dr. Margaretmary Eddy office. The patient was seen earlier in the Emergency Room for lower extremity weakness and unable to stand on his own. Had blood work done, urinalysis done, and was sent home to follow up with Dr. Sherryll Burger. In Dr. Margaretmary Eddy office, the patient had fever of 102, significant weakness, unable to stand up or ambulate, and has been admitted to the Hospitalist service for workup of his fever, and also get an MRI of the cervical spine, as per Dr. Margaretmary Eddy request. The patient, at baseline, tends to ambulate with a cane. He does have chronic weakness for many years, but this has worsened since yesterday. The patient has had a cough for about a week with some trouble breathing without any chest pain.   PAST MEDICAL HISTORY:  Hypertension, diabetes mellitus, CKD stage III, NPH, cervical myelopathy.   SOCIAL HISTORY: The patient has a 60 pack-year smoking history, quit 15 years back. Alcohol:  Occasional, but quit 15 years back. The patient lives with his wife. Ambulates with a cane and walker, but independently.   CODE STATUS:  Full code.   FAMILY HISTORY:  Reviewed, diabetes in mom.   HOME MEDICATIONS INCLUDE:  1.  Aggrenox 1 capsule oral once a day.  2.  Amlodipine 5 mg oral once a day.  3.  Cymbalta 30 mg oral once a day.  4.  Cymbalta 60 mg oral at bedtime.  5.  Donepezil 10 mg oral once a day. 7.  Finasteride 5 mg oral once a day.  8.  Humalog 100 units subcutaneous 2 times a day.  9.  Lantus 100 units subcutaneous once a day at bedtime.  10.  Namenda 10 mg oral 2 times a day.   12.  Timolol 1 drop to each eye twice a day.  13.  Vytorin 10/40 oral once a day.   REVIEW OF SYSTEMS: CONSTITUTIONAL:  No weight loss or gain. Has fatigue.  EYES:  No blurred vision, pain, redness.  EARS, NOSE, THROAT:  No tinnitus, ear pain, hearing loss.  RESPIRATORY:  Has shortness of breath and cough, nonproductive. No chest pain.  CARDIOVASCULAR:  No chest pain, syncope, palpitations, PND, orthopnea or edema.  GASTROINTESTINAL:  No nausea, vomiting, diarrhea, abdominal pain.  GENITOURINARY: Has chronic on and off incontinence with hesitancy. No frequency, hematuria.  SKIN:  No rash, ulcers.  MUSCULOSKELETAL:  No joint swelling or redness. No myalgias.  NEUROLOGIC:  Has lower extremity weakness, which has worsened from before. No seizures.   ALLERGIES:  No known drug allergies.   PHYSICAL EXAMINATION: VITAL SIGNS: Temperature 99.2, was 102.9 at Dr. Margaretmary Eddy office; respirations 18, blood pressure 149/80, saturating 96% on room air.  GENERAL:  Obese African-American male patient lying in bed, comfortable, conversational, cooperative with exam.  PSYCHIATRIC:  Alert, oriented x 3. Mood and affect appropriate. Judgment intact.  HEENT: Atraumatic, normocephalic. Oral mucosa moist and pink. External ears and nose normal. No pallor. No icterus. Pupils bilaterally equal and react to light.  NECK:  Supple. No thyromegaly. No palpable lymph nodes. Trachea midline. No carotid bruit, JVD.  CARDIOVASCULAR: S1, S2, regular rate  and rhythm without any murmurs. No edema. Peripheral pulses 2+.  RESPIRATORY:  Has crackles in the left lower lobe. Good air entry on both sides. Normal work of breathing.  GASTROINTESTINAL: Soft abdomen, nontender. Bowel sounds present. No hepatosplenomegaly palpable.  SKIN:  Warm and dry. No petechiae, rash or ulcers.  MUSCULOSKELETAL:  No joint swelling, redness, effusion of the large joints. Normal muscle tone.  NEUROLOGIC:  Motor strength 4/4 in bilateral lower  extremities, and 4+/5 in upper extremities. Sensation was intact all over. Cranial nerves II to XII intact.  LYMPHATIC:  No cervical lymphadenopathy.  GENITOURINARY:  No CVA tenderness or bladder distention.   LAB STUDIES:  Show glucose 154, BUN 23, creatinine 1.51, sodium 140, potassium 4.1, albumin 3.2. Troponin less than 0.02. WBCs 7, hemoglobin 12.6, platelets 129. These labs are from the ER early Tuesday morning. Urinalysis showed no bacteria and 1 WBC.   ASSESSMENT AND PLAN: 1.  Bilateral lower extremity weakness, unable to get out of bed, and significant change from previous state, as per Dr. Sherryll BurgerShah. Will get MRI of the cervical spine, as requested by Dr. Sherryll BurgerShah. This could also be secondary to a systemic infection and the fever patient has had.   2.  Fever. No clear source at this time. No urinary tract infection. No concern for meningitis or diskitis at this time. Discussed with Dr. Sherryll BurgerShah. Will work up for possible bronchitis, pneumonia. Get a chest x-ray. Start patient on Levaquin. Get blood cultures and sputum cultures.   3.  Hypertension, well-controlled. Continue medications.   4.  Diabetes mellitus. Continue the insulin sliding scale and diabetic diet.   5.  Chronic kidney disease stage III, stable.   6.  Deep venous thrombosis prophylaxis with heparin.   CODE STATUS:  FULL CODE.   Time spent today on this case was 55 minutes.     ____________________________ Molinda BailiffSrikar R. Sudini, MD srs:mr D: 04/20/2012 18:53:58 ET T: 04/20/2012 19:32:50 ET JOB#: [id number]  cc: Wardell HeathSrikar R. Sudini, MD, <Dictator> Marisue IvanKanhka Linthavong, MD Hemang K. Sherryll BurgerShah, MD     Orie FishermanSRIKAR R SUDINI MD ELECTRONICALLY SIGNED 04/20/2012 20:51

## 2014-06-02 NOTE — Consult Note (Signed)
Brief Consult Note: Diagnosis: bil LE weakness, fever and cough.   Patient was seen by consultant.   Consult note dictated.   Comments: - pt had acute on chronic bil LE weakness without numbness. has been wearing pads for unirnary incontinence. - previous history suggestive of NPH and also had cervical myelopathy. - recent fever, cough, leucocytosis were concerning for systemic infection causing worsening of neurological capacity. - agree with C-spine MRI, if neg earlier was considering MRI brain but today wife mentioned improvement in bil LE strength and cognition. - metabolic work up neg so far. - will follow.  Electronic Signatures: Jolene ProvostShah, Hemang Kalpeshkumar (MD)  (Signed 12-Mar-14 20:58)  Authored: Brief Consult Note   Last Updated: 12-Mar-14 20:58 by Jolene ProvostShah, Hemang Kalpeshkumar (MD)

## 2014-06-02 NOTE — Consult Note (Signed)
PATIENT NAME:  William Byrd, William Byrd MR#:  [id number] DATE OF BIRTH:  10/06/1931  DATE OF CONSULTATION:  04/23/2012  REFERRING PHYSICIAN:  Dion Body, M.D. CONSULTING PHYSICIAN:  A. Lavone Orn, MD  CHIEF COMPLAINT:  Diabetes with hypoglycemia.   HISTORY OF PRESENT ILLNESS:  This is a 81 year old male with a history of type 2 diabetes who was admitted on March 11 with difficulty with ambulation and fever.  Work-up for infectious cause of fever has been unrevealing.  He has had a negative chest x-ray, urine studies and blood cultures.  He is being followed by neurology and working with physical therapy.   The patient is well-known to me.  He has type 2 diabetes.  Diabetes is chronically uncontrolled and has been complicated by peripheral neuropathy, nephropathy, and retinopathy.  His outpatient regimen includes Lantus 14 units at bedtime and Humalog sliding scale before meals, 5 units if 70 to 100, 8 units if 101 to 150, 10 units if 151 to 250, and 15 units if over 251.  His current hemoglobin A1c is 11.1%.  The patient has an element of dementia and his wife assists with monitoring blood sugars and administering insulin.  Compliance has been questionable in the past.  On admission, there was some misunderstanding about his outpatient insulin dosing.  He was given 100 units of Lantus on the evening of March 11 and had nocturnal hypoglycemia with sugars in the 30s.  He was also given Humalog 20 units twice daily before meals.  The evening of March 12 he was given a total of 32 units fast acting insulin and that evening he had hypoglycemia in the 50s.  Last evening no Lantus was given, however he was given Humalog 20 units at supper and again he had severe nocturnal hypoglycemia in the 20s.  The patient and his wife claim he has been eating most of his meal trays.  Meals are supplemented with [employer].  He believes he feels mentally back to baseline.  He has no acute complaints at this  time.   PAST MEDICAL HISTORY: 1.  Type 2 diabetes.  2.  Diabetic peripheral neuropathy.  3.  Diabetic nephropathy with proteinuria.  4.  Mild nonproliferative diabetic retinopathy.  5.  Peripheral arterial disease.  6.  Hypertension.  7.  Hyperlipidemia.  8.  History of cataracts.  9.  History of glaucoma.  10.  Dementia.  11.  Stage 2 to 3 chronic kidney disease.  12.  History of hyperkalemia with ACE inhibitor.  13.  BPH.  14.  History of TIA, 2010 and December 2013.  15.  NPH.  PAST SURGICAL HISTORY: 1.  C-spine surgery June 2011.  2.  Angioplasty 1999.   INPATIENT MEDICATIONS:  1.  Vancomycin 1 gram daily.  2.  Amlodipine 5 mg daily.  3.  Duloxetine 30 mg daily.  4.  Enalapril 5 mg daily.  5.  Proscar 5 mg daily.  6.  Namenda 10 mg twice daily.  7.  Aggrenox 25/200 1 tab twice daily.  8.  Heparin 5000 units subQ q. 12 hours.  9.  NovoLog insulin sliding scale.  10.  Zosyn 3.375 grams IV q. 8 hours.   SOCIAL HISTORY:  The patient is married.  The patient lives with his wife, nonsmoker.   FAMILY HISTORY:  Positive for diabetes and hypertension.   ALLERGIES:  FOSINOPRIL HAS CAUSED HYPERKALEMIA.  No known drug allergies.   REVIEW OF SYSTEMS:  HEENT:  Denies blurred vision.  Denies sore  throat.  NECK:  Denies neck pain or dysphagia.  CARDIAC:  Denies chest pain or palpitations.  PULMONARY:  He reports a cough for several days.  He has had some shortness of breath.  ABDOMEN:  Reports good appetite.  Denies abdominal pain.  EXTREMITIES:  Denies leg swelling.  SKIN:  Denies rash or recent skin changes.  ENDOCRINE:  Denies heat or cold intolerance.    PHYSICAL EXAMINATION: VITAL SIGNS:  Height 68.9 inches, weight 159 pounds, BMI 23.5, temp 100, pulse 94, respirations 26 to 39, blood pressure 129/59, pulse ox 92%.  GENERAL:  Well-appearing African American male.  HEENT:  Extraocular movements are intact.  Oropharynx is clear.  NECK:  Supple.  CARDIAC:  Regular rate  and rhythm.  No audible murmur.  PULMONARY:  Tachypneic, crackles at left base.  Good inspiratory effort.  ABDOMEN:  Soft, nontender.  EXTREMITIES:  No edema is present.  SKIN:  No rash or dermatopathy is present.  PSYCHIATRIC:  Alert, oriented and cooperative.   LABORATORY DATA:  Glucose 168, BUN 22, creatinine 1.25, sodium 139, potassium 4.2, chloride 108, eGFR greater than 60, calcium 8.4, hematocrit 39.7, hemoglobin 13.6, WBC 5.0, platelets 134.   ASSESSMENT:  A 81 year old male with history of diabetes complicated by peripheral neuropathy, retinopathy and nephropathy who has had widely variable blood sugars and history of uncontrolled diabetes.   RECOMMENDATIONS: 1.  We will resume his outpatient dose of Lantus which is 14 units at bedtime.  2.  Resume insulin sliding scale at meals.  3.  Should blood sugars be elevated fasting, I would titrate up the Lantus by 2 to 4 units only.  4.  Should the blood sugars be elevated throughout the day, nonfasting, then we could add a standing dose of prandial insulin such as with NovoLog 5 units before meals.  5.  Continue NovoLog sliding scale, though I will adjust it to make slightly less aggressive.   6.  Should his eGFR remain greater than 50, we could consider adding an oral antidiabetic such as metformin.  I plan to likely defer this to outpatient.   Thank you for the kind request for consultation.  I will be unable to see patient over this upcoming weekend, however if he remains in-house on 04/26/2012, I will see him again at that time.     ____________________________ A. Lavone Orn, MD ams:ea D: 04/23/2012 17:33:16 ET T: 04/24/2012 04:51:47 ET JOB#: [id number]  cc: A. Lavone Orn, MD, <Dictator> Sherlon Handing MD ELECTRONICALLY SIGNED 04/27/2012 13:12

## 2014-06-02 NOTE — Consult Note (Signed)
PATIENT NAME:  William Byrd, William Byrd MR#:  [id number] DATE OF BIRTH:  November 04, 1931  DATE OF CONSULTATION:  04/21/2012  REFERRING PHYSICIAN:   CONSULTING PHYSICIAN:  Dr. Boris Lown. Srikar Sudini.  REASON FOR CONSULTATION:  Lower extremity weakness.   HISTORY OF PRESENT ILLNESS:  The patient is a 81 year old African American gentleman very well-known to me.    Has a history of normal pressure hydrocephalus and cervical myelopathy status post ACDF.   Has been having some difficulty walking for a long period of time, but for the last two days he was feeling very weak and could not get up on 04/20/2012.   His wife could not get him up, she called 9-1-1 and brought him to the ER and he was evaluated and then was sent to my clinic.   The patient had a fever of 102 and was feeling coughing and he just did not feel right for the last week or so, so was worried about systemic infection making his neurological function worse.   The patient also has been having worsening problem with controlling his bladder and has been wearing pads.   The patient denied any burning pain when he goes to pee or having any more difficulty with bowel movement.   He does not have any new rash.  He did not have any head trauma.  The patient denied any other focal weakness or numbness.   He feels like he just cannot stand up.   PAST MEDICAL HISTORY:  Significant for hypertension, diabetes, chronic kidney disease stage 3, normal pressure hydrocephalus and cervical myelopathy.   He also has a diabetic peripheral neuropathy.   PAST SURGICAL HISTORY:  Significant for angioplasty in 1999 and C-spine surgery in June of 2011.   FAMILY HISTORY:  Significant with the father had a bladder infection and heart disease.  Mother had diabetic complications.     SOCIAL HISTORY:  Significant that he is married.  He lives with his wife in [place].  He is retired.  He was a previous smoker, but currently has quit.  Does not drink  alcohol.  Does not do any recreational drugs.    ALLERGIES:  Were reviewed.   MEDICATIONS:  Were reviewed.  I reviewed his home medication list.  REVIEW OF SYSTEMS:  Were reviewed.  Review of system was positive for fever, cough, inability to walk, weakness of both legs.   The rest of the 10 system review of system was asked and it was found to be negative.   PHYSICAL EXAMINATION: VITAL SIGNS:  Temperature was 98.2, pulse 78, respiratory rate 18, blood pressure 159/90, pulse oximetry 91%.  GENERAL:  He is elderly-looking African American gentleman, looks much better than yesterday.  He was eating his food this evening.   NEUROLOGIC:  He was alert, oriented except he could not tell me the date or the day.  He was able to tell me his address which he was not able to do yesterday.  He was able to tell me his wife's name, but could not tell me her date of birth.  He was able to follow one-step commands, but he still had decreased attention span which is his baseline.  He still had good social skills.  CRANIAL NERVES:  His pupils are equal, round and reactive.  Extraocular movements were psychotic and slow.  His face was symmetric.  Tongue was midline.  Facial sensations were intact.  He has decreased hearing.  MOTOR:  His bilateral upper extremity seemed to be normal  for his age and medical condition.  In his bilateral lower extremity was 4 minus out of 5, which is significantly better than yesterday's exam in the clinic.   SENSATION:  Were intact to light touch, but he has a profound decreased vibration and proprioception and temperature sensation in his distal arm and legs.  REFLEXES:  Were reduced to trace.  He has absent ankle jerks.   ASSESSMENT AND PLAN: 1.  Acute on chronic bilateral lower extremity weakness with worsening urinary problems, were concerning for cord compression versus worsening of normal pressure hydrocephalus.  But with his fever and a cough and generalized malaise, I wanted  to make sure that the systemic infection is not causing him to have worsening of his neurological symptoms which seems to be more likely in his case for now as his lower extremity strength has improved after starting on antibiotics and hydration.  2.  The patient also had leukocytosis.  3.  The patient's inability to tolerate minor infection might be due to his poor physiological result from his baseline normal pressure hydrocephalus and cervical myelopathy due to worsening cervical disk disease.   I feel like he would benefit from rehab.   At baseline, he does have a significant cognitive impairment and I am afraid of developing delirium in a patient with dementia at baseline.   I explained this to the wife.   I will follow this patient with you in the hospital.  Feel free to contact me with any further questions.      ____________________________ Durene CalHemang K. Sherryll BurgerShah, MD hks:ea D: 04/21/2012 21:11:58 ET T: 04/22/2012 00:38:37 ET JOB#: [id number]  cc: William K. Sherryll BurgerShah, MD, <Dictator> Durene CalHEMANG K [facility] MD ELECTRONICALLY SIGNED 04/23/2012 10:32

## 2014-06-03 NOTE — H&P (Signed)
PATIENT NAME:  William Byrd, William Byrd MR#:  [id number] DATE OF BIRTH:  [birth year]  DATE OF ADMISSION:  09/10/2013  REFERRING PHYSICIAN: Dr. Margarita GrizzleWoodruff.   FAMILY PHYSICIAN: Dr. Burnadette PopLinthavong.   REASON FOR ADMISSION: Pneumonia with SIRS  HISTORY OF PRESENT ILLNESS: The patient is an 81 year old male with a significant history of diabetes, hypertension, hyperlipidemia, as well as left lower extremity DVT in May for which he is on Xarelto.  Presents to the Emergency Room with acute onset of fever, lethargy, confusion, and weakness. In the Emergency Room, the patient was found to be hypoxic, febrile, and tachycardic. Pneumonia was noted on chest x-ray. He is now admitted for further evaluation.   PAST MEDICAL HISTORY:  1.  Hyperlipidemia.  2.  Type 2 diabetes.  3.  Benign hypertension.  4.  Left lower extremity DVT on Xarelto.  5.  Alzheimer dementia.  6.  BPH.  7.  Depression.   MEDICATIONS:  1.  Xarelto 20 mg p.o. daily.  2.  Aricept 10 mg p.o. at bedtime.  3.  Duloxetine 60 mg p.o. daily.  4.  Vasotec 10 mg p.o. daily.  5.  Proscar 5 mg p.o. daily.  6.  Lantus 20 units subcutaneous at bedtime.  7.  Namenda 10 mg p.o. b.i.d.  8.  Vytorin 10/40 one p.o. daily.   ALLERGIES: NO KNOWN DRUG ALLERGIES.   SOCIAL HISTORY: The patient is a former smoker, but none recently. No history of alcohol abuse.   FAMILY HISTORY: Positive for hypertension and diabetes.   REVIEW OF SYSTEMS: Unable to obtain due to patient's dementia.   PHYSICAL EXAMINATION:  GENERAL: The patient is chronically ill-appearing, in moderate respiratory distress.  VITAL SIGNS: Remarkable for a blood pressure of 136/76 with a heart rate of 119, respiratory rate of 30, temperature 102.6, saturations are 99% on 2 liters.  HEENT: Normocephalic, atraumatic. Pupils are equal, round, and reactive to light and accommodation. Extraocular movements are intact. Sclerae are anicteric. Conjunctivae are clear. Oropharynx is clear.  NECK: Supple  without JVD. No adenopathy or thyromegaly is noted.  LUNGS: Reveals coarse wheezes and rhonchi bilaterally.  Respiratory effort is increased. No rales. No dullness.  CARDIAC: Rapid rate with a regular rhythm. Normal S1 and S2. No significant rubs, murmurs or gallops. PMI is nondisplaced. Chest wall is nontender.  ABDOMEN: Soft and nontender with normoactive bowel sounds. No organomegaly or masses were appreciated. No hernias or bruits were noted.  EXTREMITIES: Revealed 1+ edema bilaterally with stasis changes. Pulses were 1+ bilaterally.  SKIN: Warm and dry without rash or lesions.  NEUROLOGIC: Cranial nerves II-XII grossly intact. Deep tendon reflexes were symmetric. Motor and sensory examination is nonfocal.  PSYCHIATRIC: Revealed a patient who was alert, although he was somewhat lethargic. Answered simple questions with one-word answers, but was not oriented to place or time.   LABORATORY DATA: Chest x-ray reveals a left lower lobe infiltrate consistent with pneumonia. EKG revealed sinus tachycardia with no acute ischemic changes. His pro time was 24.4 with an INR of 2.3. White count 8.7 with a hemoglobin of 11.8. Glucose was 277 with a BUN of 25, creatinine 2.11 and a GFR of 33. BMP was 229. Troponin less than 0.02.   ASSESSMENT:  1.  Systemic inflammatory response syndrome.  2.  Pneumonia.  3.  Dehydration with acute kidney injury.  4.  Type 2 diabetes.  5.  Dementia.  6.  Benign hypertension, stable.   PLAN: The patient will be admitted to the floor as a full  code with telemetry. We will begin IV fluids with IV antibiotics. Cultures have been sent. We will add DuoNeb SVNs. Will follow his sugars with Accu-Cheks before meals and at bedtime and add sliding scale insulin as needed. Clear liquid diet for now. Continue his outpatient regimen. Follow up labs and chest x-ray in the morning. Wean oxygen as tolerated. Further treatment and evaluation will depend upon the patient's progress.   TOTAL  TIME SPENT ON THIS PATIENT: 50 minutes.    ____________________________ Duane Lope Judithann Sheen, MD jds:ts D: 09/10/2013 18:55:38 ET T: 09/10/2013 19:15:02 ET JOB#: [id number]  cc: Duane Lope. Judithann Sheen, MD, <Dictator> Marisue Ivan, MD JEFFREY Rodena Medin MD ELECTRONICALLY SIGNED 09/11/2013 15:20

## 2014-06-03 NOTE — Discharge Summary (Signed)
PATIENT NAME:  William Byrd, Sadler C MR#:  [id number] DATE OF BIRTH:  [birth year]  DATE OF ADMISSION:  09/10/2013 DATE OF DISCHARGE:  09/16/2013  DISCHARGE DIAGNOSES: 1.  Acute on chronic systolic congestive heart failure with ejection fraction of 45% to 50%.  2.  History of left lower extremity deep vein thrombosis, on Xarelto. 3.  Adult onset diabetes.  4.  Chronic kidney disease with a creatinine of 1.8. 5.  Hypertension.  6.  Dementia.  7.  Hyperlipidemia.  8.  Generalized weakness.   DISCHARGE MEDICATIONS: 1.  Vytorin 10/40 one tab p.o. daily.  2.  Donepezil 10 mg p.o. at bedtime.  3.  Finasteride 5 mg p.o. daily.  4.  Acetaminophen 325 mg 2 tabs q. 4 hours as needed for pain and fever.  5.  Enalapril 10 mg p.o. daily.  6.  Namenda 10 mg p.o. b.i.d.  7.  Duloxetine 60 mg extended release 1 tab p.o. b.i.d.  8.  Lantus 20 units subcutaneous at bedtime.  9.  Timolol ophthalmic 0.5% one drop in each eye b.i.d.  10.  Xarelto 20 mg p.o. daily.  11.  Insulin aspart 40 units t.i.d. with meals, hold if CBG prior to meal is less than 110.  12.  Furosemide 40 mg p.o. b.i.d.  13.  Potassium chloride 20 mEq p.o. b.i.d.   HOME OXYGEN: The patient is to be on oxygen per nasal cannula 2 liters at all times.   CONSULTS: None.   PROCEDURES: Echocardiogram showed EF of 45% to 50%.  PERTINENT LABS PRIOR TO DISCHARGE: Sodium 141, potassium 3.3, creatinine 1.8, glucose 189.   BRIEF HOSPITAL COURSE: Acute on chronic systolic congestive heart failure exacerbation. The patient came in with acute worsening respiratory function, found to have pulmonary edema consistent with fluid overload. He was diuresed and has responded well. He is currently on 2 liters of oxygen and still diuresing at this time, but overall improving. He did show some signs of generalized weakness, was evaluated by physical therapy and needs further rehab for this reason. He is in stable condition to be discharged for further rehab.  Other chronic issues are stable at this time and will continue with the current regimen. He is going to a SNF. Follow up with Dr. Burnadette PopLinthavong after discharge from the SNF.  ____________________________ Marisue IvanKanhka Wilkin Lippy, MD kl:sb D: 09/16/2013 08:29:02 ET T: 09/16/2013 11:11:22 ET JOB#: [id number]  cc: Marisue IvanKanhka Chozen Latulippe, MD, <Dictator> Marisue IvanKANHKA Delmore Sear MD ELECTRONICALLY SIGNED 10/10/2013 8:24

## 2014-06-11 NOTE — Discharge Summary (Signed)
PATIENT NAME:  [name], William Byrd MR#:  [id number] DATE OF BIRTH:  1931/04/28  DATE OF ADMISSION:  02/22/2014 DATE OF DISCHARGE:  To be determined with an addendum later.   DISCHARGE DIAGNOSES: 1. Acute left lower lobe pneumonia community-acquired.  2. Generalized weakness.  3. Acute on chronic renal failure, back to baseline.  4. Insulin-dependent diabetes.  5. Hypertension.  6. Dementia.   DISCHARGE MEDICATIONS: Continue all home medications seen on his discharge instructions. New medication is losartan 25 mg p.o. daily and Levaquin 250 mg p.o. daily x 8 more days, stop date is 03/04/2014.   HOME MEDICATIONS: Include Xarelto 15 mg p.o. daily, Vytorin 10/20, 1 tab p.o. at bedtime, vitamin D3 1000 international units daily, Timolol ophthalmic to affected eye twice a day, Proscar 5 mg p.o. daily, Namenda XR 1 capsule daily, Lantus 20 units at bedtime. Humalog per sliding scale, duloxetine 60 mg p.o. daily. Donepezil 10 mg p.o. at bedtime, docusate 100 mg 1 tab p.o. b.i.d. p.r.n. for constipation, amlodipine 10 mg p.o. daily.   CONSULTANTS: None.   PROCEDURES: None.   PERTINENT LABORATORY DATA:  Chest x-ray confirmed a left lower lobe pneumonia On day of discharge: Sodium 142, potassium 4, creatinine 1.59, glucose 78. White blood cell count 10.2, hemoglobin 10.1, platelets 177.   BRIEF HOSPITAL COURSE:  1. Left lower lobe pneumonia, community acquired. The patient initially came in with overall generalized weakness was found to have cough and chest x-ray consistent with a left lower lobe infiltrate, consistent with community-acquired pneumonia. White blood cell count was elevated at 14,000. He was treated with Levaquin IV x 2 days and converted over to oral Levaquin, and has been doing well clinically, overall improving.  2. Generalized weakness. The patient has a steady decline with underlying dementia. His wife is unable to care for him, given his significant decline. PT evaluated the patient  and noted that he needed further therapy, so he is being placed at SNF.  3. Other chronic issues are stable at this time.   DISPOSITION: He is in fair condition and will be discharged to a skilled nursing facility for further rehab. Awaiting placement at this time.    ____________________________ Marisue IvanKanhka Alba Perillo, MD kl:mw D: 02/24/2014 08:25:10 ET T: 02/24/2014 11:18:03 ET JOB#: [id number]  cc: Marisue IvanKanhka Kynnedy Carreno, MD, <Dictator> Marisue IvanKANHKA Vernor Monnig MD ELECTRONICALLY SIGNED 02/28/2014 9:12

## 2014-06-11 NOTE — Discharge Summary (Signed)
PATIENT NAME:  William Byrd, Manual C MR#:  [id number] DATE OF BIRTH:  1931-12-11  DATE OF ADMISSION:  02/22/2014 DATE OF DISCHARGE:  02/25/2014   ADDENDUM:   [name] had an uneventful night last night. He remains afebrile. He is alert and oriented, and feels well. Physical therapy has been recommended by the therapist. [facility].   The patient was seen. I spoke with his wife. Discharge will take placed today.    ____________________________ Letta PateJohn B. Danne HarborWalker III, MD jbw:MT D: 02/25/2014 11:22:51 ET T: 02/25/2014 11:33:19 ET JOB#: [id number]  cc: Letta PateJohn B. Danne HarborWalker III, MD, <Dictator> Elmo PuttJOHN B WALKER III MD ELECTRONICALLY SIGNED 02/26/2014 9:39

## 2014-06-11 NOTE — H&P (Signed)
PATIENT NAME:  William Byrd, William Byrd MR#:  [id number] DATE OF BIRTH:  05-14-1931  DATE OF ADMISSION:  02/22/2014  PRIMARY CARE PHYSICIAN:  Dr. Burnadette PopLinthavong.    REFERRING EMERGENCY ROOM PHYSICIAN: Dr. Gwendolyn GrantGrady Goodman.   CHIEF COMPLAINT: Weakness.   HISTORY OF PRESENT ILLNESS: This 81 year old gentleman with past medical history of hypertension, diabetes, Alzheimer dementia, presents today from home with complaint of weakness and altered mental status. The history is provided by his wife, as the patient has advanced dementia. His wife reports that at baseline the patient uses a walker or a wheelchair for mobility. This morning when he woke up he could not even get out of bed. She helped him to stand and he felt that he could not move his legs at all. She got him to the breakfast table and found that he was unable to feed himself, he was dropping food. He kept slumping to the side in his chair. The wife reports that he has actually been getting weaker and weaker since Thanksgiving. She denies any nausea, vomiting, or diarrhea. She states that he has had a decreased appetite and has not been drinking fluids at all. He has had a cough. He was diagnosed with pneumonia in August of this year and the wife reports that his cough has not improved since that time. He has had some chills. No fevers or sweats, or rigors. He has not had any shortness of breath or wheezing. He has not had any sputum production, no hemoptysis.   PAST MEDICAL HISTORY:  1.  Esophageal dysmotility.  2.  Alzheimer dementia.  3.  Hyperlipidemia.  4.  Diabetes mellitus type 2 requiring insulin.  5.  Hypertension.  6.  History of left lower extremity DVT on Xarelto.   7.  Benign prosthetic hypertrophy.  8.  Depression.   9.  Diabetic peripheral neuropathy.   PAST SURGICAL HISTORY: Anterior cervical fusion.   SOCIAL HISTORY: The patient lives with his wife. He uses a wheelchair or walker for ambulation. He has had at least 2 falls this year.  The patient is a former smoker, not currently smoking cigarettes. No alcohol or illicit substance abuse.   FAMILY MEDICAL HISTORY: Positive for hypertension and diabetes.   REVIEW OF SYSTEMS: Unable to obtain due to dementia.   ALLERGIES: No known allergies.   HOME MEDICATIONS:  1. Xarelto 15 mg 1 tablet daily.  2. Vytorin 10 mg-20 mg 1 tablet once a day at bedtime.  3. Vitamin D3, 1000 international units 1 tablet daily.  4. Timolol ophthalmic 0.5% ophthalmic solution 1 drop to each effected eye twice a day.  5. Proscar 5 mg 1 tablet daily.  6. Namenda XR 1 capsule once a day.  7. Insulin glargine 100 units/mL subcutaneous solution, 20 units once a day at bedtime. 8. Humalog KwikPen 100 units per mL subcutaneous solution per sliding scale.  9. Duloxetine 60 mg 1 capsule once a day.  10. Donepezil 10 mg 1 tablet once a day at bedtime.  11. Docusate sodium 100 mg tablet, 1 tablet twice a day as needed for constipation.  12. Amlodipine 10 mg 1 tablet once a day.   PHYSICAL EXAMINATION:   VITAL SIGNS: Temperature 98.7, pulse 89, respirations 18, blood pressure 131/74, oxygenation 94% on 2 liters nasal cannula.  GENERAL: The patient is thin, frail, no acute distress.  HEENT: Pupils equal, round, and reactive to light, conjunctivae are clear, extraocular motion is intact, oral mucous membranes are dry, there is thick yellow mucus  in the posterior oropharynx, no edema or erythema noted.  NECK: Supple, no cervical lymphadenopathy, trachea midline.  PULMONARY: There are bibasilar crackles, good air movement, no respiratory distress.  CARDIOVASCULAR: Regular rate and rhythm, no murmurs, rubs, or gallops, trace peripheral edema bilaterally, + 1 peripheral pulses.  ABDOMEN: Distended, tense, nontender, no guarding, no rebound, no hepatosplenomegaly, bowel sounds are normal.  MUSCULOSKELETAL: No joint effusions, passive range of motion is normal, he is diffusely weak with strength 4 out of 4 in the  upper extremities, he is somewhat slow to engage his full muscle strength, lower extremities are 3 + to 4 out of 5 bilaterally and equal. NEUROLOGIC: Cranial nerves II through XII are grossly intact, strength is decreased throughout as noted above, sensation is intact, good muscle tone.  PSYCHIATRIC: The patient is alert, at baseline he is oriented to himself and to family members, he seems to be at his baseline at this time, no signs of uncontrolled depression or anxiety.    LABORATORY DATA: Sodium 139, potassium 4.1, chloride 106, bicarbonate 28, BUN 26, creatinine 2.04, glucose is 141. White blood cells 14, hemoglobin 11.2, platelets 202,000, MCV is 92. UA with no white blood cells, 100 mg/dL of protein, no hematuria.   IMAGING: CT scan of the head without contrast shows ventriculomegaly likely related to atrophy and chronic microvascular changes. Not significantly changed. No acute intracranial abnormalities are seen.   Chest x-ray shows mild atelectasis versus infiltrate of the left lower lobe.   ASSESSMENT AND PLAN:  1.  Pneumonia: Generalized weakness likely due to pneumonia. Chest x-ray showing a left lower lobe infiltrate. He does have leukocytosis. Afebrile at this time with a robust blood pressure. Blood cultures have been obtained. He has been started on Levaquin. His oxygen saturation is currently 94% on room air.  2.  Generalized weakness: At baseline this patient has bilateral lower extremity weakness due to diabetic peripheral neuropathy. He uses a walker or wheelchair for ambulation. He is diffusely weak at this time, I will consult physical therapy to be sure that he is at his baseline and does not have any additional physical therapy or equipment needs.  3.  Acute kidney injury: Creatinine has gone from 1.7 to 2.0. Acute kidney injury likely due to decreased p.o. intake over the past few days. Will hydrate gently. Recheck renal function in the morning. Electrolytes currently stable.  His baseline creatinine about 1.8.  4.  Dementia: The patient seems to be at about his baseline mental status per his wife. He has been more confused over the past few days but seems to be improving.  5.  Diabetes mellitus type 2: He will continue with his home insulin regimen of Lantus and sliding scale. Check a hemoglobin A1c.   6.  Hypertension: Blood pressure well controlled at this time. We will continue his home regimen of amlodipine.  7.  History of deep vein thrombosis: Continue Xarelto.   8.  Prophylaxis: He will be on Xarelto while patient. No GI  prophylaxis at this time.  TIME SPENT ON ADMISSION: 40 minutes.    ____________________________ Ena Dawley. Clent Ridges, MD cpw:bu D: 02/22/2014 19:26:27 ET T: 02/22/2014 19:39:24 ET JOB#: [id number]  cc: Santina Evans P. Clent Ridges, MD, <Dictator> Gale Journey MD ELECTRONICALLY SIGNED 03/02/2014 18:44

## 2014-08-13 ENCOUNTER — Emergency Department
Admission: EM | Admit: 2014-08-13 | Discharge: 2014-08-13 | Disposition: A | Discharge status: Home / Self Care | Payer: Medicare Other | Attending: Emergency Medicine | Admitting: Emergency Medicine

## 2014-08-13 ENCOUNTER — Emergency Department: Payer: Medicare Other

## 2014-08-13 ENCOUNTER — Other Ambulatory Visit: Payer: Self-pay

## 2014-08-13 DIAGNOSIS — R531 Weakness: Secondary | ICD-10-CM | POA: Diagnosis present

## 2014-08-13 DIAGNOSIS — F0390 Unspecified dementia without behavioral disturbance: Secondary | ICD-10-CM | POA: Diagnosis not present

## 2014-08-13 HISTORY — DX: Unspecified dementia, unspecified severity, without behavioral disturbance, psychotic disturbance, mood disturbance, and anxiety: F03.90

## 2014-08-13 HISTORY — DX: Type 2 diabetes mellitus without complications: E11.9

## 2014-08-13 HISTORY — DX: Essential (primary) hypertension: I10

## 2014-08-13 LAB — CBC
HCT: 32 % — ABNORMAL LOW (ref 40.0–52.0)
Hemoglobin: 10.5 g/dL — ABNORMAL LOW (ref 13.0–18.0)
MCH: 29.5 pg (ref 26.0–34.0)
MCHC: 32.9 g/dL (ref 32.0–36.0)
MCV: 89.8 fL (ref 80.0–100.0)
Platelets: 229 10*3/uL (ref 150–440)
RBC: 3.57 MIL/uL — ABNORMAL LOW (ref 4.40–5.90)
RDW: 15.4 % — AB (ref 11.5–14.5)
WBC: 8.8 10*3/uL (ref 3.8–10.6)

## 2014-08-13 LAB — URINALYSIS COMPLETE WITH MICROSCOPIC (ARMC ONLY)
BILIRUBIN URINE: NEGATIVE
Glucose, UA: 50 mg/dL — AB
Ketones, ur: NEGATIVE mg/dL
Leukocytes, UA: NEGATIVE
Nitrite: NEGATIVE
Protein, ur: 100 mg/dL — AB
Specific Gravity, Urine: 1.008 (ref 1.005–1.030)
pH: 5 (ref 5.0–8.0)

## 2014-08-13 LAB — TROPONIN I: Troponin I: <0.03 ng/mL (ref <0.031)

## 2014-08-13 LAB — COMPREHENSIVE METABOLIC PANEL
ALBUMIN: 3.1 g/dL — AB (ref 3.5–5.0)
ALT: 23 U/L (ref 17–63)
ANION GAP: 8 (ref 5–15)
AST: 33 U/L (ref 15–41)
Alkaline Phosphatase: 76 U/L (ref 38–126)
BILIRUBIN TOTAL: 0.8 mg/dL (ref 0.3–1.2)
BUN: 29 mg/dL — ABNORMAL HIGH (ref 6–20)
CALCIUM: 9.1 mg/dL (ref 8.9–10.3)
CO2: 25 mmol/L (ref 22–32)
CREATININE: 2 mg/dL — AB (ref 0.61–1.24)
Chloride: 102 mmol/L (ref 101–111)
GFR calc non Af Amer: 30 mL/min — ABNORMAL LOW (ref >60)
GFR, EST AFRICAN AMERICAN: 34 mL/min — AB (ref >60)
GLUCOSE: 258 mg/dL — AB (ref 65–99)
POTASSIUM: 4.3 mmol/L (ref 3.5–5.1)
SODIUM: 135 mmol/L (ref 135–145)
Total Protein: 7.7 g/dL (ref 6.5–8.1)

## 2014-08-13 NOTE — ED Notes (Signed)
MD Kinner at bedside  

## 2014-08-13 NOTE — ED Provider Notes (Signed)
[facility] Emergency Department Provider Note  ____________________________________________  Time seen: On arrival, the EMS  I have reviewed the triage vital signs and the nursing notes.   HISTORY  Chief Complaint Weakness and Altered Mental Status  History Limited secondary to dementia  HPI William Byrd is a 81 y.o. male who presents with complaints of weakness. Per EMS patient has a history of dementia and lives at home with wife who cares for him. This morning patient had difficulty getting out of bed and had less energy than usual. He has a wet cough and EMS is also concerned about a urinary tract infection because of the smell. EMS reports a fever of 101.3. Patient is unable to give further history because of his dementia     No past medical history on file.  There are no active problems to display for this patient.   No past surgical history on file.  No current outpatient prescriptions on file.  Allergies Review of patient's allergies indicates not on file.  No family history on file.  Social History History  Substance Use Topics  . Smoking status: Not on file  . Smokeless tobacco: Not on file  . Alcohol Use: Not on file    Review of Systems  Constitutional: Negative for fever. Eyes: Negative for visual changes. ENT: Negative for sore throat Cardiovascular: Negative for chest pain. Respiratory: Negative for shortness of breath. Gastrointestinal: Negative for abdominal pain, vomiting and diarrhea. Genitourinary: Negative for dysuria. Musculoskeletal: Negative for back pain. Skin: Negative for rash. Neurological: Negative for headaches   10-point ROS otherwise negative. Accuracy of review of symptoms is questionable given dementia  ____________________________________________   PHYSICAL EXAM:  VITAL SIGNS: ED Triage Vitals  Enc Vitals Group     BP --      Pulse --      Resp --      Temp --      Temp src --       SpO2 --      Weight --      Height --      Head Cir --      Peak Flow --      Pain Score --      Pain Loc --      Pain Edu? --      Excl. in GC? --      Constitutional: Alert.Well appearing and in no distress. Eyes: Conjunctivae are normal.  ENT   Head: Normocephalic and atraumatic.   Mouth/Throat: Mucous membranes are moist. Cardiovascular: Normal rate, regular rhythm. Normal and symmetric distal pulses are present in all extremities. No murmurs, rubs, or gallops. Respiratory: Normal respiratory effort without tachypnea nor retractions. Breath sounds are clear and equal bilaterally.  Gastrointestinal: Soft and non-tender in all quadrants. No distention. There is no CVA tenderness. Genitourinary: No rash or swelling Musculoskeletal: Nontender with normal range of motion in all extremities. No lower extremity tenderness nor edema. Neurologic:  Normal speech and language. No gross focal neurologic deficits are appreciated. Skin:  Skin is warm, dry and intact. No rash noted. Psychiatric: Mood and affect are normal.   ____________________________________________    LABS (pertinent positives/negatives)  Labs Reviewed  CULTURE, BLOOD (ROUTINE X 2)  CULTURE, BLOOD (ROUTINE X 2)  URINE CULTURE  CBC  COMPREHENSIVE METABOLIC PANEL  TROPONIN I  URINALYSIS COMPLETEWITH MICROSCOPIC ([facility] ONLY)    ____________________________________________   EKG  ED ECG REPORT I, Jene EveryKINNER, Jaikob Borgwardt, the attending physician, personally viewed and  interpreted this ECG.  Date: 08/13/2014 EKG Time: 11:42 AM Rate: 91 Rhythm: normal sinus rhythm QRS Axis: normal Intervals: normal ST/T Wave abnormalities: normal Conduction Disutrbances: none Narrative Interpretation: unremarkable   ____________________________________________    RADIOLOGY I have personally reviewed any xrays that were ordered on this patient:  Chest x-ray improved from prior  CT head  unremarkable ____________________________________________   PROCEDURES  Procedure(s) performed: none  Critical Care performed: none  ____________________________________________   INITIAL IMPRESSION / ASSESSMENT AND PLAN / ED COURSE  Pertinent labs & imaging results that were available during my care of the patient were reviewed by me and considered in my medical decision making (see chart for details).  Patient overall well-appearing however fever and no diffuse weakness concerning for infection possibly urinary tract infection versus pneumonia given cough.  ____________________________________________ ----------------------------------------- 2:58 PM on 08/13/2014 -----------------------------------------  Discussed results with family and patient. Mild elevation in creatinine consistent with prior results and likely related to increased Lasix. No evidence of urinary tract infection or pneumonia. Family reports patient is at his baseline currently. They agree with discharge and report they will bring him back if any changes or worsening in his condition.  FINAL CLINICAL IMPRESSION(S) / ED DIAGNOSES  Final diagnoses:  Weakness generalized     Jene Every, MD 08/13/14 [phone]

## 2014-08-13 NOTE — ED Notes (Signed)
Pt comes into the ED via EMS from home with c/o increased weakness and AMS from baseline..states his wife is his caregiver and this morning when she attempted to get him to the wheelchair it was more difficult then normal and pt is not acting his normal self..does having some dementia.the patient denies any pain .the patient has noted abd distention and 2+ pitting edema in lower extremities.

## 2014-08-13 NOTE — Discharge Instructions (Signed)

## 2014-08-14 LAB — URINE CULTURE: CULTURE: NO GROWTH

## 2014-08-18 LAB — CULTURE, BLOOD (ROUTINE X 2): Culture: NO GROWTH

## 2014-08-20 LAB — CULTURE, BLOOD (ROUTINE X 2)

## 2014-09-04 ENCOUNTER — Encounter: Payer: Self-pay | Admitting: Emergency Medicine

## 2014-09-04 ENCOUNTER — Emergency Department: Payer: Medicare Other

## 2014-09-04 ENCOUNTER — Inpatient Hospital Stay
Admission: EM | Admit: 2014-09-04 | Discharge: 2014-09-08 | DRG: 557 | Disposition: A | Discharge status: Skilled Nursing Facility | Payer: Medicare Other | Attending: Internal Medicine | Admitting: Internal Medicine

## 2014-09-04 DIAGNOSIS — M6282 Rhabdomyolysis: Principal | ICD-10-CM | POA: Diagnosis present

## 2014-09-04 DIAGNOSIS — F0280 Dementia in other diseases classified elsewhere without behavioral disturbance: Secondary | ICD-10-CM | POA: Diagnosis present

## 2014-09-04 DIAGNOSIS — R531 Weakness: Secondary | ICD-10-CM | POA: Diagnosis present

## 2014-09-04 DIAGNOSIS — R29898 Other symptoms and signs involving the musculoskeletal system: Secondary | ICD-10-CM | POA: Diagnosis present

## 2014-09-04 DIAGNOSIS — G0491 Myelitis, unspecified: Secondary | ICD-10-CM | POA: Diagnosis present

## 2014-09-04 DIAGNOSIS — E1140 Type 2 diabetes mellitus with diabetic neuropathy, unspecified: Secondary | ICD-10-CM | POA: Diagnosis present

## 2014-09-04 DIAGNOSIS — I129 Hypertensive chronic kidney disease with stage 1 through stage 4 chronic kidney disease, or unspecified chronic kidney disease: Secondary | ICD-10-CM | POA: Diagnosis present

## 2014-09-04 DIAGNOSIS — N179 Acute kidney failure, unspecified: Secondary | ICD-10-CM | POA: Diagnosis present

## 2014-09-04 DIAGNOSIS — G309 Alzheimer's disease, unspecified: Secondary | ICD-10-CM

## 2014-09-04 DIAGNOSIS — E119 Type 2 diabetes mellitus without complications: Secondary | ICD-10-CM

## 2014-09-04 DIAGNOSIS — E785 Hyperlipidemia, unspecified: Secondary | ICD-10-CM | POA: Diagnosis present

## 2014-09-04 DIAGNOSIS — I82403 Acute embolism and thrombosis of unspecified deep veins of lower extremity, bilateral: Secondary | ICD-10-CM | POA: Diagnosis present

## 2014-09-04 DIAGNOSIS — M4800 Spinal stenosis, site unspecified: Secondary | ICD-10-CM | POA: Diagnosis present

## 2014-09-04 DIAGNOSIS — M609 Myositis, unspecified: Secondary | ICD-10-CM | POA: Diagnosis present

## 2014-09-04 DIAGNOSIS — Z87891 Personal history of nicotine dependence: Secondary | ICD-10-CM

## 2014-09-04 DIAGNOSIS — I10 Essential (primary) hypertension: Secondary | ICD-10-CM | POA: Diagnosis present

## 2014-09-04 DIAGNOSIS — N400 Enlarged prostate without lower urinary tract symptoms: Secondary | ICD-10-CM | POA: Diagnosis present

## 2014-09-04 DIAGNOSIS — I739 Peripheral vascular disease, unspecified: Secondary | ICD-10-CM | POA: Diagnosis present

## 2014-09-04 DIAGNOSIS — Z7901 Long term (current) use of anticoagulants: Secondary | ICD-10-CM

## 2014-09-04 DIAGNOSIS — Z888 Allergy status to other drugs, medicaments and biological substances status: Secondary | ICD-10-CM

## 2014-09-04 DIAGNOSIS — N183 Chronic kidney disease, stage 3 unspecified: Secondary | ICD-10-CM | POA: Diagnosis present

## 2014-09-04 DIAGNOSIS — Z794 Long term (current) use of insulin: Secondary | ICD-10-CM

## 2014-09-04 DIAGNOSIS — F0390 Unspecified dementia without behavioral disturbance: Secondary | ICD-10-CM | POA: Diagnosis present

## 2014-09-04 DIAGNOSIS — R0989 Other specified symptoms and signs involving the circulatory and respiratory systems: Secondary | ICD-10-CM

## 2014-09-04 DIAGNOSIS — Z86718 Personal history of other venous thrombosis and embolism: Secondary | ICD-10-CM

## 2014-09-04 HISTORY — DX: Chronic kidney disease, unspecified: N18.9

## 2014-09-04 HISTORY — DX: Unspecified osteoarthritis, unspecified site: M19.90

## 2014-09-04 HISTORY — DX: Peripheral vascular disease, unspecified: I73.9

## 2014-09-04 LAB — BASIC METABOLIC PANEL
ANION GAP: 6 (ref 5–15)
BUN: 33 mg/dL — ABNORMAL HIGH (ref 6–20)
CO2: 28 mmol/L (ref 22–32)
Calcium: 9 mg/dL (ref 8.9–10.3)
Chloride: 103 mmol/L (ref 101–111)
Creatinine, Ser: 2.17 mg/dL — ABNORMAL HIGH (ref 0.61–1.24)
GFR calc Af Amer: 31 mL/min — ABNORMAL LOW (ref >60)
GFR calc non Af Amer: 27 mL/min — ABNORMAL LOW (ref >60)
Glucose, Bld: 111 mg/dL — ABNORMAL HIGH (ref 65–99)
Potassium: 4.1 mmol/L (ref 3.5–5.1)
SODIUM: 137 mmol/L (ref 135–145)

## 2014-09-04 LAB — URINALYSIS COMPLETE WITH MICROSCOPIC (ARMC ONLY)
Bacteria, UA: NONE SEEN
Bilirubin Urine: NEGATIVE
Glucose, UA: 50 mg/dL — AB
Ketones, ur: NEGATIVE mg/dL
LEUKOCYTES UA: NEGATIVE
Nitrite: NEGATIVE
PROTEIN: 100 mg/dL — AB
Specific Gravity, Urine: 1.019 (ref 1.005–1.030)
Squamous Epithelial / LPF: NONE SEEN
pH: 5 (ref 5.0–8.0)

## 2014-09-04 LAB — CBC
HCT: 32.9 % — ABNORMAL LOW (ref 40.0–52.0)
HEMOGLOBIN: 10.9 g/dL — AB (ref 13.0–18.0)
MCH: 29.6 pg (ref 26.0–34.0)
MCHC: 33.3 g/dL (ref 32.0–36.0)
MCV: 88.9 fL (ref 80.0–100.0)
Platelets: 243 10*3/uL (ref 150–440)
RBC: 3.7 MIL/uL — ABNORMAL LOW (ref 4.40–5.90)
RDW: 15.6 % — ABNORMAL HIGH (ref 11.5–14.5)
WBC: 4.5 10*3/uL (ref 3.8–10.6)

## 2014-09-04 MED ORDER — OXYCODONE-ACETAMINOPHEN 5-325 MG PO TABS
1.0000 | ORAL_TABLET | Freq: Once | ORAL | Status: AC
  Administered 2014-09-04: 1 via ORAL
  Filled 2014-09-04: qty 1

## 2014-09-04 NOTE — ED Notes (Signed)
MD at bedside. 

## 2014-09-04 NOTE — ED Notes (Signed)
Pt to ed with c/o unable to stand since yesterday.  Pt states he feels weak and is unable to ambulate or stand due to burning in his legs. Denies SOB,  Pt does report weakness.

## 2014-09-04 NOTE — ED Provider Notes (Addendum)
[facility] Emergency Department Provider Note  ____________________________________________  Time seen: Approximately 220 PM  I have reviewed the triage vital signs and the nursing notes.   HISTORY  Chief Complaint Leg Pain    HPI William Byrd is a 81 y.o. male with a history of dementia, diabetes and neuropathy who presents today with left lower extremity cramping. He says that he is here in the emergency department because he has also had weakness and was unable to stand and walk with his walker this morning. He normally walks with a walker at baseline. He has had multiple episodes for weakness in the lower extremities. 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 He says that he has chronic edema to the lower extremities which is actually reduced over time. Says that the pain is to his left lower extremity from his hip all the way to the ankles. It is burning in quality. No increased numbness.   Past Medical History  Diagnosis Date  . Dementia   . Diabetes mellitus without complication   . Hypertension     There are no active problems to display for this patient.   History reviewed. No pertinent past surgical history.  Current Outpatient Rx  Name  Route  Sig  Dispense  Refill  . amLODipine (NORVASC) 5 MG tablet   Oral   Take 5 mg by mouth daily.         . Cholecalciferol (VITAMIN D-1000 MAX ST) 1000 UNITS tablet   Oral   Take 1,000 Units by mouth daily.         Marland Kitchen docusate sodium (STOOL SOFTENER) 100 MG capsule   Oral   Take 100 mg by mouth daily as needed. For constipation         . donepezil (ARICEPT) 10 MG tablet   Oral   Take 10 mg by mouth at bedtime.         . DULoxetine (CYMBALTA) 60 MG capsule   Oral   Take 60 mg by mouth daily.         . finasteride (PROSCAR) 5 MG tablet   Oral   Take 5 mg by mouth daily.         . furosemide (LASIX) 20 MG tablet   Oral   Take 20 mg by  mouth daily.         Marland Kitchen HUMALOG KWIKPEN 100 UNIT/ML KiwkPen   Subcutaneous   Inject 10-15 Units into the skin See admin instructions. Inject 10 units subcutaneous before breakfast and lunch. Inject 15 units subcutaneous before supper. Do not skip dose.           Dispense as written.   . insulin glargine (LANTUS) 100 unit/mL SOPN   Subcutaneous   Inject 20 Units into the skin at bedtime.         Marland Kitchen NAMENDA XR 28 MG CP24 24 hr capsule   Oral   Take 28 mg by mouth daily.           Dispense as written.   . TRADJENTA 5 MG TABS tablet   Oral   Take 5 mg by mouth daily.           Dispense as written.     Allergies Fosinopril  History reviewed. No pertinent family history.  Social History History  Substance Use Topics  . Smoking status: Former Games developer  . Smokeless tobacco: Not on file  .  Alcohol Use: No    Review of Systems Constitutional: No fever/chills Eyes: No visual changes. ENT: No sore throat. Cardiovascular: Denies chest pain. Respiratory: Denies shortness of breath. Gastrointestinal: No abdominal pain.  No nausea, no vomiting.  No diarrhea.  No constipation. Wife complaining of black stool since this past Sunday. Genitourinary: Negative for dysuria. Musculoskeletal: Negative for back pain. Skin: Negative for rash. Neurological: Negative for headaches 10-point ROS otherwise negative.  ____________________________________________   PHYSICAL EXAM:  VITAL SIGNS: ED Triage Vitals  Enc Vitals Group     BP 09/04/14 1043 133/59 mmHg     Pulse Rate 09/04/14 1043 86     Resp 09/04/14 1043 18     Temp 09/04/14 1043 98.1 F (36.7 C)     Temp Source 09/04/14 1043 Oral     SpO2 09/04/14 1043 100 %     Weight 09/04/14 1043 179 lb (81.194 kg)     Height 09/04/14 1043  (1.702 m)     Head Cir --      Peak Flow --      Pain Score 09/04/14 1056 6     Pain Loc --      Pain Edu? --      Excl. in GC? --     Constitutional: Alert and oriented. Well  appearing and in no acute distress. Eyes: Conjunctivae are normal. PERRL. EOMI. Head: Atraumatic. Nose: No congestion/rhinnorhea. Mouth/Throat: Mucous membranes are moist.  Oropharynx non-erythematous. Neck: No stridor.   Cardiovascular: Normal rate, regular rhythm. Grossly normal heart sounds.  Good peripheral circulation. Respiratory: Normal respiratory effort.  No retractions. Lungs CTAB. Gastrointestinal: Soft and nontender. No distention. No abdominal bruits. No CVA tenderness. Musculoskeletal: No lower extremity tenderness .  No joint effusions. Bilateral lower extremity edema which is moderate. No induration or pus. No tenderness to the back, step-off or deformity. Neurologic:  Normal speech and language. No gross focal neurologic deficits are appreciated. Weak 4 out of 5 strength to bilateral lower extremities. No saddle anesthesia. Skin:  Skin is warm, dry and intact. No rash noted. Psychiatric: Mood and affect are normal. Speech and behavior are normal.  ____________________________________________   LABS (all labs ordered are listed, but only abnormal results are displayed)  Labs Reviewed  BASIC METABOLIC PANEL - Abnormal; Notable for the following:    Glucose, Bld 111 (*)    BUN 33 (*)    Creatinine, Ser 2.17 (*)    GFR calc non Af Amer 27 (*)    GFR calc Af Amer 31 (*)    All other components within normal limits  CBC - Abnormal; Notable for the following:    RBC 3.70 (*)    Hemoglobin 10.9 (*)    HCT 32.9 (*)    RDW 15.6 (*)    All other components within normal limits  URINALYSIS COMPLETEWITH MICROSCOPIC ([facility] ONLY) - Abnormal; Notable for the following:    Color, Urine YELLOW (*)    APPearance CLEAR (*)    Glucose, UA 50 (*)    Hgb urine dipstick 1+ (*)    Protein, ur 100 (*)    All other components within normal limits  CBG MONITORING, ED   ____________________________________________  EKG  ED ECG REPORT I, Arelia Longest, the attending physician,  personally viewed and interpreted this ECG.   Date: 09/04/2014  EKG Time: 1110  Rate: 82  Rhythm: normal sinus rhythm  Axis: Normal axis  Intervals:none  ST&T Change: No ST segment elevations or depressions.  No abnormal T-wave inversions.  ____________________________________________  RADIOLOGY  Nonocclusive DVTs in the bilateral femoral veins. Dr. Ty Hilts who believes that these may be chronic. ____________________________________________   PROCEDURES    ____________________________________________   INITIAL IMPRESSION / ASSESSMENT AND PLAN / ED COURSE  Pertinent labs & imaging results that were available during my care of the patient were reviewed by me and considered in my medical decision making (see chart for details).  ----------------------------------------- 9:28 PM on 09/04/2014 -----------------------------------------  Attempted to walk patient could not ambulate at baseline. Unsteady. Upon further discussion with the wife the patient has needed increasing assistance of the past several weeks getting into bed. Also with recent visit for weakness in his legs. We'll have social work see. Social work consult ordered as well as physical therapy. The patient really needs rehabilitation. ____________________________________________   FINAL CLINICAL IMPRESSION(S) / ED DIAGNOSES  Bilateral lower extremity weakness with ambulatory dysfunction. Initial visit.    Myrna Blazer, MD 09/04/14 2129  Discussed further with wife.  No recent diarrheal illness, uri or immunizations.  No documented fever on [facility] vitals during last two visits.  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    Myrna Blazer, MD 09/04/14 2240  Change in patient's disposition.  Will admit.  D/w Dr. Katrinka Blazing of neurology who recommends ck, myoglobin and lft for further w/u.  Pt's wife aware that pt will be admitted.  Pending head ct.  Dr. Zenda Alpers to f/u with head ct and admit.    Myrna Blazer, MD 09/04/14 2300

## 2014-09-04 NOTE — ED Notes (Signed)
PT  Has left for the day

## 2014-09-04 NOTE — ED Notes (Signed)
Patient transported to Ultrasound 

## 2014-09-04 NOTE — ED Notes (Signed)
Gave urinal for patient to get urine.  He says he went at home.

## 2014-09-05 ENCOUNTER — Encounter: Payer: Self-pay | Admitting: Internal Medicine

## 2014-09-05 DIAGNOSIS — M609 Myositis, unspecified: Secondary | ICD-10-CM | POA: Diagnosis present

## 2014-09-05 DIAGNOSIS — Z794 Long term (current) use of insulin: Secondary | ICD-10-CM | POA: Diagnosis not present

## 2014-09-05 DIAGNOSIS — R29898 Other symptoms and signs involving the musculoskeletal system: Secondary | ICD-10-CM | POA: Diagnosis present

## 2014-09-05 DIAGNOSIS — I129 Hypertensive chronic kidney disease with stage 1 through stage 4 chronic kidney disease, or unspecified chronic kidney disease: Secondary | ICD-10-CM | POA: Diagnosis present

## 2014-09-05 DIAGNOSIS — E1140 Type 2 diabetes mellitus with diabetic neuropathy, unspecified: Secondary | ICD-10-CM | POA: Diagnosis present

## 2014-09-05 DIAGNOSIS — Z7901 Long term (current) use of anticoagulants: Secondary | ICD-10-CM | POA: Diagnosis not present

## 2014-09-05 DIAGNOSIS — M6282 Rhabdomyolysis: Secondary | ICD-10-CM | POA: Diagnosis present

## 2014-09-05 DIAGNOSIS — I10 Essential (primary) hypertension: Secondary | ICD-10-CM | POA: Diagnosis present

## 2014-09-05 DIAGNOSIS — I739 Peripheral vascular disease, unspecified: Secondary | ICD-10-CM | POA: Diagnosis present

## 2014-09-05 DIAGNOSIS — N183 Chronic kidney disease, stage 3 unspecified: Secondary | ICD-10-CM | POA: Diagnosis present

## 2014-09-05 DIAGNOSIS — I82403 Acute embolism and thrombosis of unspecified deep veins of lower extremity, bilateral: Secondary | ICD-10-CM | POA: Diagnosis present

## 2014-09-05 DIAGNOSIS — M4800 Spinal stenosis, site unspecified: Secondary | ICD-10-CM | POA: Diagnosis present

## 2014-09-05 DIAGNOSIS — E785 Hyperlipidemia, unspecified: Secondary | ICD-10-CM | POA: Diagnosis present

## 2014-09-05 DIAGNOSIS — F0390 Unspecified dementia without behavioral disturbance: Secondary | ICD-10-CM | POA: Diagnosis present

## 2014-09-05 DIAGNOSIS — N179 Acute kidney failure, unspecified: Secondary | ICD-10-CM | POA: Diagnosis present

## 2014-09-05 DIAGNOSIS — R531 Weakness: Secondary | ICD-10-CM | POA: Diagnosis present

## 2014-09-05 DIAGNOSIS — N400 Enlarged prostate without lower urinary tract symptoms: Secondary | ICD-10-CM | POA: Diagnosis present

## 2014-09-05 DIAGNOSIS — Z888 Allergy status to other drugs, medicaments and biological substances status: Secondary | ICD-10-CM | POA: Diagnosis not present

## 2014-09-05 DIAGNOSIS — F0280 Dementia in other diseases classified elsewhere without behavioral disturbance: Secondary | ICD-10-CM | POA: Diagnosis present

## 2014-09-05 DIAGNOSIS — G309 Alzheimer's disease, unspecified: Secondary | ICD-10-CM

## 2014-09-05 DIAGNOSIS — E119 Type 2 diabetes mellitus without complications: Secondary | ICD-10-CM

## 2014-09-05 DIAGNOSIS — Z86718 Personal history of other venous thrombosis and embolism: Secondary | ICD-10-CM | POA: Diagnosis not present

## 2014-09-05 DIAGNOSIS — Z87891 Personal history of nicotine dependence: Secondary | ICD-10-CM | POA: Diagnosis not present

## 2014-09-05 DIAGNOSIS — G0491 Myelitis, unspecified: Secondary | ICD-10-CM | POA: Diagnosis present

## 2014-09-05 LAB — BASIC METABOLIC PANEL
ANION GAP: 6 (ref 5–15)
BUN: 31 mg/dL — ABNORMAL HIGH (ref 6–20)
CALCIUM: 8.8 mg/dL — AB (ref 8.9–10.3)
CO2: 29 mmol/L (ref 22–32)
CREATININE: 1.77 mg/dL — AB (ref 0.61–1.24)
Chloride: 105 mmol/L (ref 101–111)
GFR calc Af Amer: 40 mL/min — ABNORMAL LOW (ref >60)
GFR calc non Af Amer: 34 mL/min — ABNORMAL LOW (ref >60)
Glucose, Bld: 231 mg/dL — ABNORMAL HIGH (ref 65–99)
Potassium: 4.1 mmol/L (ref 3.5–5.1)
Sodium: 140 mmol/L (ref 135–145)

## 2014-09-05 LAB — GLUCOSE, CAPILLARY
GLUCOSE-CAPILLARY: 240 mg/dL — AB (ref 65–99)
Glucose-Capillary: 245 mg/dL — ABNORMAL HIGH (ref 65–99)
Glucose-Capillary: 215 mg/dL — ABNORMAL HIGH (ref 65–99)
Glucose-Capillary: 205 mg/dL — ABNORMAL HIGH (ref 65–99)

## 2014-09-05 LAB — HEPATIC FUNCTION PANEL
ALBUMIN: 3.1 g/dL — AB (ref 3.5–5.0)
ALT: 23 U/L (ref 17–63)
AST: 34 U/L (ref 15–41)
Alkaline Phosphatase: 66 U/L (ref 38–126)
TOTAL PROTEIN: 7.4 g/dL (ref 6.5–8.1)
Total Bilirubin: 0.5 mg/dL (ref 0.3–1.2)

## 2014-09-05 LAB — TSH: TSH: 4.513 u[IU]/mL — ABNORMAL HIGH (ref 0.350–4.500)

## 2014-09-05 LAB — HEMOGLOBIN A1C: HEMOGLOBIN A1C: 9.5 % — AB (ref 4.0–6.0)

## 2014-09-05 LAB — SEDIMENTATION RATE: Sed Rate: 108 mm/hr — ABNORMAL HIGH (ref 0–20)

## 2014-09-05 LAB — CK: CK TOTAL: 805 U/L — AB (ref 49–397)

## 2014-09-05 LAB — VITAMIN B12: Vitamin B-12: 403 pg/mL (ref 180–914)

## 2014-09-05 MED ORDER — SIMVASTATIN 40 MG PO TABS: 40.0000 mg | ORAL_TABLET | Freq: Every day | ORAL | Status: DC

## 2014-09-05 MED ORDER — PREGABALIN 75 MG PO CAPS
75.0000 mg | ORAL_CAPSULE | Freq: Two times a day (BID) | ORAL | Status: DC
  Administered 2014-09-05 – 2014-09-08 (×7): 75 mg via ORAL
  Filled 2014-09-05 (×7): qty 1

## 2014-09-05 MED ORDER — ACETAMINOPHEN 650 MG RE SUPP: 650.0000 mg | Freq: Four times a day (QID) | RECTAL | Status: DC | PRN

## 2014-09-05 MED ORDER — DULOXETINE HCL 30 MG PO CPEP
60.0000 mg | ORAL_CAPSULE | Freq: Every day | ORAL | Status: DC
  Administered 2014-09-05 – 2014-09-08 (×4): 60 mg via ORAL
  Filled 2014-09-05 (×4): qty 2

## 2014-09-05 MED ORDER — ACETAMINOPHEN 325 MG PO TABS: 650.0000 mg | ORAL_TABLET | Freq: Four times a day (QID) | ORAL | Status: DC | PRN

## 2014-09-05 MED ORDER — ASPIRIN EC 81 MG PO TBEC
81.0000 mg | DELAYED_RELEASE_TABLET | Freq: Every day | ORAL | Status: DC
  Administered 2014-09-05 – 2014-09-08 (×4): 81 mg via ORAL
  Filled 2014-09-05 (×4): qty 1

## 2014-09-05 MED ORDER — EZETIMIBE 10 MG PO TABS
10.0000 mg | ORAL_TABLET | Freq: Every day | ORAL | Status: DC
  Administered 2014-09-05 – 2014-09-08 (×4): 10 mg via ORAL
  Filled 2014-09-05 (×4): qty 1

## 2014-09-05 MED ORDER — INSULIN ASPART 100 UNIT/ML ~~LOC~~ SOLN
0.0000 [IU] | Freq: Three times a day (TID) | SUBCUTANEOUS | Status: DC
  Administered 2014-09-05 (×3): 5 [IU] via SUBCUTANEOUS
  Administered 2014-09-06: 15 [IU] via SUBCUTANEOUS
  Administered 2014-09-06: 2 [IU] via SUBCUTANEOUS
  Administered 2014-09-06 – 2014-09-07 (×2): 3 [IU] via SUBCUTANEOUS
  Administered 2014-09-07: 6 [IU] via SUBCUTANEOUS
  Administered 2014-09-08: 8 [IU] via SUBCUTANEOUS
  Filled 2014-09-05: qty 2
  Filled 2014-09-05 (×2): qty 5
  Filled 2014-09-05: qty 8
  Filled 2014-09-05: qty 6
  Filled 2014-09-05: qty 15
  Filled 2014-09-05: qty 5
  Filled 2014-09-05 (×2): qty 3
  Filled 2014-09-05: qty 5

## 2014-09-05 MED ORDER — DONEPEZIL HCL 5 MG PO TABS
10.0000 mg | ORAL_TABLET | Freq: Every day | ORAL | Status: DC
  Administered 2014-09-05 – 2014-09-07 (×3): 10 mg via ORAL
  Filled 2014-09-05 (×3): qty 2

## 2014-09-05 MED ORDER — EZETIMIBE-SIMVASTATIN 10-40 MG PO TABS: 1.0000 | ORAL_TABLET | Freq: Every day | ORAL | Status: DC

## 2014-09-05 MED ORDER — FUROSEMIDE 20 MG PO TABS
20.0000 mg | ORAL_TABLET | Freq: Every day | ORAL | Status: DC
  Administered 2014-09-05 – 2014-09-07 (×3): 20 mg via ORAL
  Filled 2014-09-05 (×4): qty 1

## 2014-09-05 MED ORDER — MEMANTINE HCL ER 7 MG PO CP24
28.0000 mg | ORAL_CAPSULE | Freq: Every day | ORAL | Status: DC
Start: 2014-09-05 — End: 2014-09-08
  Administered 2014-09-05 – 2014-09-08 (×4): 28 mg via ORAL
  Filled 2014-09-05 (×4): qty 4

## 2014-09-05 MED ORDER — FINASTERIDE 5 MG PO TABS
5.0000 mg | ORAL_TABLET | Freq: Every day | ORAL | Status: DC
  Administered 2014-09-05 – 2014-09-08 (×4): 5 mg via ORAL
  Filled 2014-09-05 (×4): qty 1

## 2014-09-05 MED ORDER — AMLODIPINE BESYLATE 5 MG PO TABS
5.0000 mg | ORAL_TABLET | Freq: Every day | ORAL | Status: DC
  Administered 2014-09-05 – 2014-09-06 (×2): 5 mg via ORAL
  Filled 2014-09-05 (×3): qty 1

## 2014-09-05 MED ORDER — VITAMIN D 1000 UNITS PO TABS
1000.0000 [IU] | ORAL_TABLET | Freq: Every day | ORAL | Status: DC
  Administered 2014-09-05 – 2014-09-08 (×4): 1000 [IU] via ORAL
  Filled 2014-09-05 (×4): qty 1

## 2014-09-05 MED ORDER — SODIUM CHLORIDE 0.9 % IV SOLN
INTRAVENOUS | Status: AC
  Administered 2014-09-05: 05:00:00 via INTRAVENOUS

## 2014-09-05 MED ORDER — METOPROLOL TARTRATE 25 MG PO TABS
25.0000 mg | ORAL_TABLET | Freq: Two times a day (BID) | ORAL | Status: DC
  Administered 2014-09-05 – 2014-09-08 (×7): 25 mg via ORAL
  Filled 2014-09-05 (×7): qty 1

## 2014-09-05 MED ORDER — PROSTATE SR 160-250 MG PO CAPS: 1.0000 | ORAL_CAPSULE | Freq: Every day | ORAL | Status: DC

## 2014-09-05 MED ORDER — PREDNISONE 20 MG PO TABS
20.0000 mg | ORAL_TABLET | Freq: Every day | ORAL | Status: DC
  Administered 2014-09-06 – 2014-09-07 (×2): 20 mg via ORAL
  Filled 2014-09-05 (×2): qty 1

## 2014-09-05 MED ORDER — LINAGLIPTIN 5 MG PO TABS
5.0000 mg | ORAL_TABLET | Freq: Every day | ORAL | Status: DC
  Administered 2014-09-05 – 2014-09-08 (×4): 5 mg via ORAL
  Filled 2014-09-05 (×4): qty 1

## 2014-09-05 MED ORDER — RIVAROXABAN 20 MG PO TABS
20.0000 mg | ORAL_TABLET | Freq: Every day | ORAL | Status: DC
  Administered 2014-09-05 – 2014-09-08 (×4): 20 mg via ORAL
  Filled 2014-09-05 (×4): qty 1

## 2014-09-05 MED ORDER — ONDANSETRON HCL 4 MG PO TABS: 4.0000 mg | ORAL_TABLET | Freq: Four times a day (QID) | ORAL | Status: DC | PRN

## 2014-09-05 MED ORDER — INSULIN DETEMIR 100 UNIT/ML ~~LOC~~ SOLN
20.0000 [IU] | Freq: Every day | SUBCUTANEOUS | Status: DC
  Administered 2014-09-05 – 2014-09-07 (×3): 20 [IU] via SUBCUTANEOUS
  Filled 2014-09-05 (×4): qty 0.2

## 2014-09-05 MED ORDER — INSULIN GLARGINE 100 UNITS/ML SOLOSTAR PEN: 20.0000 [IU] | PEN_INJECTOR | Freq: Every day | SUBCUTANEOUS | Status: DC

## 2014-09-05 MED ORDER — DOCUSATE SODIUM 100 MG PO CAPS: 100.0000 mg | ORAL_CAPSULE | Freq: Every day | ORAL | Status: DC | PRN

## 2014-09-05 MED ORDER — ONDANSETRON HCL 4 MG/2ML IJ SOLN: 4.0000 mg | Freq: Four times a day (QID) | INTRAMUSCULAR | Status: DC | PRN

## 2014-09-05 NOTE — Clinical Social Work Note (Signed)
Clinical Social Work Assessment  Patient Details  Name: William Byrd MRN: [id number] Date of Birth: 1931-02-28  Date of referral:  09/05/14               Reason for consult:  Facility Placement                Permission sought to share information with:  Chartered certified accountant granted to share information::  Yes, Verbal Permission Granted  Name::      [place]::   [place]   Relationship::     Contact Information:     Housing/Transportation Living arrangements for the past 2 months:  [place] of Information:  Spouse Patient Interpreter Needed:  None Criminal Activity/Legal Involvement Pertinent to Current Situation/Hospitalization:  No - Comment as needed Significant Relationships:  Spouse Lives with:  Spouse Do you feel safe going back to the place where you live?  Yes Need for family participation in patient care:  Yes (Comment)  Care giving concerns:  Patient lives with his wife Corliss Skains [phone] in [place].    Social Worker assessment / plan: Holiday representative (CSW) received SNF consult. PT is recommending SNF. CSW met with patient and his wife at bedside. CSW introduced self and explained role of CSW department. Per wife they live in [place]. CSW explained SNF process. Wife is agreeable to SNF search and prefers [facility]. Wife reported that patient has been to [facility] in the past. CSW explained to wife that patient will need a 3 night qualifying inpatient stay in order for Medicare to pay for rehab. Wife verbalized her understanding.    FL2 complete and faxed out.    Employment status:  Retired Forensic scientist:  [employer] PT Recommendations:  [place] / Referral to community resources:  [place]  Patient/Family's Response to care:  Wife is agreeable to SNF search and prefers [facility].   Patient/Family's  Understanding of and Emotional Response to Diagnosis, Current Treatment, and Prognosis: Patient and his wife were pleasant throughout assessment.   Emotional Assessment Appearance:  Appears stated age Attitude/Demeanor/Rapport:    Affect (typically observed):  Quiet, Pleasant Orientation:  Fluctuating Orientation (Suspected and/or reported Sundowners) Alcohol / Substance use:  Not Applicable Psych involvement (Current and /or in the community):  No (Comment)  Discharge Needs  Concerns to be addressed:  Discharge Planning Concerns Readmission within the last 30 days:  No Current discharge risk:  Chronically ill Barriers to Discharge:  Continued Medical Work up   Loralyn Freshwater, LCSW 09/05/2014, 4:24 PM

## 2014-09-05 NOTE — Care Management Note (Signed)
Case Management Note  Patient Details  Name: William Byrd MRN: [id number] Date of Birth: 11-Sep-1931  Subjective/Objective:                    Action/Plan: Patient brought in to ED due to inability to ambulate; PT pending. Met with patient but he didn't have much to say. I attempted to contact patient's wife at number on facesheet but was only able to leave a message to call RNCM. List of home health agencies left at patient's bedside. RNCM will continue to follow.   Expected Discharge Date:                  Expected Discharge Plan:     In-House Referral:  Clinical Social Work  Discharge planning Services  CM Consult  Post Acute Care Choice:  Home Health Choice offered to:  Spouse  DME Arranged:    DME Agency:     HH Arranged:    [place] Agency:     Status of Service:  In process, will continue to follow  Medicare Important Message Given:    Date Medicare IM Given:    Medicare IM give by:    Date Additional Medicare IM Given:    Additional Medicare Important Message give by:     If discussed at [facility] of Stay Meetings, dates discussed:    Additional Comments:  Marshell Garfinkel, RN 09/05/2014, 9:53 AM
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
## 2014-09-05 NOTE — Plan of Care (Signed)
Problem: Consults Goal: General Medical Patient Education See Patient Education Module for specific education. Outcome: Not Met (add Reason) Patient has dementia and needs reinforcing.

## 2014-09-05 NOTE — H&P (Signed)
[facility] [place] at [facility]   PATIENT NAME: William Byrd    MR#:  [id number]  DATE OF BIRTH:  1931-06-08  DATE OF ADMISSION:  09/04/2014  PRIMARY CARE PHYSICIAN: Marisue Ivan, MD   REQUESTING/REFERRING PHYSICIAN: Dr. Zenda Alpers  CHIEF COMPLAINT:   Chief Complaint  Patient presents with  . Leg Pain   progressive worsening of bilateral lower extremity weakness with pain and difficulty in ambulation  HISTORY OF PRESENT ILLNESS:  William Byrd  is a 81 y.o. male with a known history of dementia, diabetes mellitus type 2, hypertension, CK D3, spinal stenosis, DVT on Xarelto was brought in by the family with the complaints of progressive worsening of bilateral lower extremity weakness and pain with difficulty in ambulation ongoing for the past few days. Patient's family is not available at this time and the history I got is from the ED physician's note. Evaluation in the ED revealed stable labs, CT head negative for any acute intracranial pathology. Neurology on call Dr. Katrinka Blazing was consulted by the ED physician who recommended admission for further workup for possible myelitis. Patient has history of left lower extremity DVT and is on Xarelto and Doppler studies done in the ED revealed bilateral femoral nonocclusive DVT. Patient is alert, awake and oriented 1 only, not able to give any history because of dementia, denies any complaints and comfortably resting in the bed.  PAST MEDICAL HISTORY:   Past Medical History  Diagnosis Date  . Dementia   . Diabetes mellitus without complication   . Hypertension   . Arthritis   . Chronic kidney disease   . Peripheral vascular disease     PAST SURGICAL HISTORY:   Past Surgical History  Procedure Laterality Date  . C-spine fusion N/A     SOCIAL HISTORY:   History  Substance Use Topics  . Smoking status: Former Games developer  . Smokeless tobacco: Not on file  . Alcohol Use: No    FAMILY HISTORY:    Family History  Problem Relation Age of Onset  . Diabetes Mother   . Arthritis Sister     DRUG ALLERGIES:   Allergies  Allergen Reactions  . Fosinopril     Other reaction(s): Other (See Comments) Hyperkalemia     REVIEW OF SYSTEMS:   Review of Systems  Constitutional: Negative for fever, chills and malaise/fatigue.  HENT: Negative for ear pain, hearing loss, nosebleeds, sore throat and tinnitus.   Eyes: Negative for blurred vision, double vision, pain, discharge and redness.  Respiratory: Negative for cough, hemoptysis, sputum production, shortness of breath and wheezing.   Cardiovascular: Negative for chest pain, palpitations, orthopnea and leg swelling.  Gastrointestinal: Negative for nausea, vomiting, abdominal pain, diarrhea, constipation, blood in stool and melena.  Genitourinary: Negative for dysuria, urgency, frequency and hematuria.  Musculoskeletal: Negative for back pain, joint pain and neck pain.  Skin: Negative for itching and rash.  Neurological: Positive for weakness. Negative for dizziness, tingling, sensory change, focal weakness and seizures.       Bilateral progressive lower extremity weakness with pain as noted in history of present illness.  Endo/Heme/Allergies: Does not bruise/bleed easily.  Psychiatric/Behavioral: Negative for depression. The patient is not nervous/anxious.     MEDICATIONS AT HOME:   Prior to Admission medications   Medication Sig Start Date End Date Taking? Authorizing Provider  acetaminophen (TYLENOL) 325 MG tablet Take 1 tablet by mouth daily.   Yes Historical Provider, MD  amLODipine (NORVASC) 5 MG tablet Take 5 mg  by mouth daily. 05/29/14  Yes Historical Provider, MD  Cholecalciferol (VITAMIN D-1000 MAX ST) 1000 UNITS tablet Take 1,000 Units by mouth daily.   Yes Historical Provider, MD  docusate sodium (STOOL SOFTENER) 100 MG capsule Take 100 mg by mouth daily as needed. For constipation   Yes Historical Provider, MD  donepezil  (ARICEPT) 10 MG tablet Take 10 mg by mouth at bedtime. 07/05/14  Yes Historical Provider, MD  DULoxetine (CYMBALTA) 60 MG capsule Take 60 mg by mouth daily. 06/01/14  Yes Historical Provider, MD  finasteride (PROSCAR) 5 MG tablet Take 5 mg by mouth daily. 07/19/14  Yes Historical Provider, MD  furosemide (LASIX) 20 MG tablet Take 20 mg by mouth daily. 07/25/14  Yes Historical Provider, MD  HUMALOG KWIKPEN 100 UNIT/ML KiwkPen Inject 10-15 Units into the skin See admin instructions. Inject 10 units subcutaneous before breakfast and lunch. Inject 15 units subcutaneous before supper. Do not skip dose. 07/14/14  Yes Historical Provider, MD  insulin glargine (LANTUS) 100 unit/mL SOPN Inject 20 Units into the skin at bedtime.   Yes Historical Provider, MD  LYRICA 75 MG capsule Take 1 capsule by mouth 2 (two) times daily. 08/29/14  Yes Historical Provider, MD  NAMENDA XR 28 MG CP24 24 hr capsule Take 28 mg by mouth daily. 07/24/14  Yes Historical Provider, MD  rivaroxaban (XARELTO) 20 MG TABS tablet Take 1 tablet by mouth daily. 07/18/14  Yes Historical Provider, MD  Saw Palmetto-Phytosterols (PROSTATE SR) 160-250 MG CAPS Take 1 capsule by mouth daily.   Yes Historical Provider, MD  TRADJENTA 5 MG TABS tablet Take 5 mg by mouth daily. 07/04/14  Yes Historical Provider, MD  VYTORIN 10-40 MG per tablet Take 1 tablet by mouth daily. 08/18/14  Yes Historical Provider, MD      VITAL SIGNS:  Blood pressure 147/73, pulse 75, temperature 98.1 F (36.7 C), temperature source Oral, resp. rate 16, height  (1.702 m), weight 81.194 kg (179 lb), SpO2 95 %.  PHYSICAL EXAMINATION:  Physical Exam  Constitutional: He appears well-developed and well-nourished. No distress.  HENT:  Head: Normocephalic and atraumatic.  Right Ear: External ear normal.  Left Ear: External ear normal.  Nose: Nose normal.  Mouth/Throat: Oropharynx is clear and moist. No oropharyngeal exudate.  Eyes: EOM are normal. Pupils are equal, round, and  reactive to light. No scleral icterus.  Neck: Normal range of motion. Neck supple. No JVD present. No thyromegaly present.  Cardiovascular: Normal rate, regular rhythm, normal heart sounds and intact distal pulses.  Exam reveals no friction rub.   No murmur heard. Respiratory: Effort normal and breath sounds normal. No respiratory distress. He has no wheezes. He has no rales. He exhibits no tenderness.  GI: Soft. Bowel sounds are normal. He exhibits no distension and no mass. There is no tenderness. There is no rebound and no guarding.  Musculoskeletal: Normal range of motion. He exhibits edema.  Lymphadenopathy:    He has no cervical adenopathy.  Neurological: He is alert. No cranial nerve deficit. He exhibits normal muscle tone.  Patient is oriented to name only.  Power in both lower extremity is 3-4/5  Skin: Skin is warm. No rash noted. No erythema.  Psychiatric: He has a normal mood and affect.   LABORATORY PANEL:   CBC  Recent Labs Lab 09/04/14 1105  WBC 4.5  HGB 10.9*  HCT 32.9*  PLT 243   ------------------------------------------------------------------------------------------------------------------  Chemistries   Recent Labs Lab 09/04/14 1105  NA 137  K  4.1  CL 103  CO2 28  GLUCOSE 111*  BUN 33*  CREATININE 2.17*  CALCIUM 9.0  AST 34  ALT 23  ALKPHOS 66  BILITOT 0.5   ------------------------------------------------------------------------------------------------------------------  Cardiac Enzymes No results for input(s): TROPONINI in the last 168 hours. ------------------------------------------------------------------------------------------------------------------  RADIOLOGY:  Ct Head Wo Contrast  09/05/2014   CLINICAL DATA:  Bilateral lower extremity weakness and cramping for one day.  EXAM: CT HEAD WITHOUT CONTRAST  TECHNIQUE: Contiguous axial images were obtained from the base of the skull through the vertex without intravenous contrast.   COMPARISON:  Head CT 08/13/2014  FINDINGS: Generalized atrophy with ventriculomegaly and mild-moderate chronic small vessel ischemic change, stable from prior. No intracranial hemorrhage, mass effect, or midline shift. The basilar cisterns are patent. No evidence of territorial infarct. No intracranial fluid collection. Calvarium is intact. Included paranasal sinuses and mastoid air cells are well aerated.  IMPRESSION: Stable chronic change without acute intracranial abnormality.   Electronically Signed   By: Rubye Oaks M.D.   On: 09/05/2014 00:12   US Venous Img Lower Bilateral  09/04/2014   CLINICAL DATA:  Bilateral lower extremity pain and swelling. History of deep venous thrombosis.  EXAM: BILATERAL LOWER EXTREMITY VENOUS DOPPLER ULTRASOUND  TECHNIQUE: Gray-scale sonography with graded compression, as well as color Doppler and duplex ultrasound were performed to evaluate the lower extremity deep venous systems from the level of the common femoral vein and including the common femoral, femoral, profunda femoral, popliteal and calf veins including the posterior tibial, peroneal and gastrocnemius veins when visible. The superficial great saphenous vein was also interrogated. Spectral Doppler was utilized to evaluate flow at rest and with distal augmentation maneuvers in the common femoral, femoral and popliteal veins.  COMPARISON:  Venous lower extremity ultrasound 06/17/2013  FINDINGS: RIGHT LOWER EXTREMITY  Common Femoral Vein: No evidence of thrombus. Normal compressibility, respiratory phasicity and response to augmentation.  Saphenofemoral Junction: No evidence of thrombus. Normal compressibility and flow on color Doppler imaging.  Profunda Femoral Vein: No evidence of thrombus. Normal compressibility and flow on color Doppler imaging.  Femoral Vein: There is linear echogenic material within the lumen of the vessel. The vessel is compressible. There is flow within lumen.  Popliteal Vein: There is  linear echogenic true of the lumen of the vessel. The vessel is compressible. There is flow within lumen.  Calf Veins: No evidence of thrombus. Normal compressibility and flow on color Doppler imaging.  Superficial Great Saphenous Vein: No evidence of thrombus. Normal compressibility and flow on color Doppler imaging.  LEFT LOWER EXTREMITY  Common Femoral Vein: No evidence of thrombus. Normal compressibility, respiratory phasicity and response to augmentation.  Saphenofemoral Junction: No evidence of thrombus. Normal compressibility and flow on color Doppler imaging.  Profunda Femoral Vein: No evidence of thrombus. Normal compressibility and flow on color Doppler imaging.  Femoral Vein: Linear echogenic material within the lumen of the vessel. Vessels is compressible. There is flow within the vessel.  Popliteal Vein: No evidence of thrombus. Normal compressibility, respiratory phasicity and response to augmentation.  Calf Veins: No evidence of thrombus. Normal compressibility and flow on color Doppler imaging.  Superficial Great Saphenous Vein: No evidence of thrombus. Normal compressibility and flow on color Doppler imaging.  IMPRESSION: 1. Nonocclusive deep venous thrombosis within the right femoral vein and popliteal vein. 2. Nonocclusive deep venous thrombosis within the left femoral vein. These results will be called to the ordering clinician or representative by the Radiologist Assistant, and communication documented in the  PACS or zVision Dashboard.   Electronically Signed   By: Genevive Bi M.D.   On: 09/04/2014 16:19    EKG:   Orders placed or performed during the hospital encounter of 09/04/14  . ED EKG normal sinus rhythm with ventricular rate of 82 bpm   . ED EKG    IMPRESSION AND PLAN:   1. Progressive weakness of bilateral lower extremities with pain and difficulty in the ablation ongoing for the past few days. CT head negative for acute IC pathology. Advised by neurology to admit for  further workup for myelitis. Plan: Admit, neuro watch, neurology consultation, PT consultation. Further workup per neurology. 2. Bilateral femoral nonocclusive DVT by Doppler studies. History of prior DVT, patient on Xarelto. Continue same. 3. Diabetes mellitus type 2, on insulin. Stable. Continue Lantus yes SSI. Follow-up blood sugars. 4. Hypertension, stable on home medications. Continue same. 5. CK D stage III. Creatinine mild elevation from baseline of 1.9. Plan: Gentle IV hydration, follow-up BMP. Avoid nephrotoxic agents. 6. Dementia, on home medications. Continue same. 7. Hyperlipidemia, on statin. Continue same. 8. History of spinal stenosis.    All the records are reviewed and case discussed with ED provider. Management plans discussed with the patient, family and they are in agreement.  CODE STATUS: Full code  TOTAL TIME TAKING CARE OF THIS PATIENT: 50 minutes.    Jonnie Kind N M.D on 09/05/2014 at 3:12 AM  Between 7am to 6pm - Pager - [phone]  After 6pm go to [email or web address] - password [email or web address] [facility]  [facility]  Office  [phone]  CC: Primary care physician; Marisue Ivan, MD

## 2014-09-05 NOTE — Clinical Social Work Placement (Signed)
   CLINICAL SOCIAL WORK PLACEMENT  NOTE  Date:  09/05/2014  Patient Details  Name: William Byrd MRN: [id number] Date of Birth: 11-Nov-1931  Clinical Social Work is seeking post-discharge placement for this patient at the Skilled  Nursing Facility level of care (*CSW will initial, date and re-position this form in  chart as items are completed):  Yes   Patient/family provided with Post Clinical Social Work Department's list of facilities offering this level of care within the geographic area requested by the patient (or if unable, by the patient's family).  Yes   Patient/family informed of their freedom to choose among providers that offer the needed level of care, that participate in Medicare, Medicaid or managed care program needed by the patient, have an available bed and are willing to accept the patient.  Yes   Patient/family informed of [facility]'s ownership interest in [facility] and [facility], as well as of the fact that they are under no obligation to receive care at these facilities.  PASRR submitted to EDS on       PASRR number received on       Existing PASRR number confirmed on 09/05/14     FL2 transmitted to all facilities in geographic area requested by pt/family on 09/05/14     FL2 transmitted to all facilities within larger geographic area on       Patient informed that his/her managed care company has contracts with or will negotiate with certain facilities, including the following:            Patient/family informed of bed offers received.  Patient chooses bed at       Physician recommends and patient chooses bed at      Patient to be transferred to   on  .  Patient to be transferred to facility by       Patient family notified on   of transfer.  Name of family member notified:        PHYSICIAN Please sign FL2     Additional Comment:    _______________________________________________ Haig Prophet, LCSW 09/05/2014, 3:59  PM

## 2014-09-05 NOTE — Consult Note (Signed)
Reason for Consult: weakness Referring Physician: Dr. Burgess Amor is an 81 y.o. male.  HPI:  Seen at request of Dr. Earleen Newport for weakness;  81 yo RHD M presents to [facility] due to leg pain and some weakness.  Per his wife, this weakness started in April 2016 and has slowly progressively gotten worse.  Pt denies any back or neck pain or headache.  Pt denies numbness and tingling as well.  He denies any arm weakness.  Past Medical History  Diagnosis Date  . Dementia   . Diabetes mellitus without complication   . Hypertension   . Arthritis   . Chronic kidney disease   . Peripheral vascular disease     Past Surgical History  Procedure Laterality Date  . C-spine fusion N/A     Family History  Problem Relation Age of Onset  . Diabetes Mother   . Arthritis Sister     Social History:  reports that he has quit smoking. He does not have any smokeless tobacco history on file. He reports that he does not drink alcohol or use illicit drugs.  Allergies:  Allergies  Allergen Reactions  . Fosinopril     Other reaction(s): Other (See Comments) Hyperkalemia     Medications: personally reviewed by me as per chart  Results for orders placed or performed during the hospital encounter of 09/04/14 (from the past 48 hour(s))  Basic metabolic panel     Status: Abnormal   Collection Time: 09/04/14 11:05 AM  Result Value Ref Range   Sodium 137 135 - 145 mmol/L   Potassium 4.1 3.5 - 5.1 mmol/L   Chloride 103 101 - 111 mmol/L   CO2 28 22 - 32 mmol/L   Glucose, Bld 111 (H) 65 - 99 mg/dL   BUN 33 (H) 6 - 20 mg/dL   Creatinine, Ser 2.17 (H) 0.61 - 1.24 mg/dL   Calcium 9.0 8.9 - 10.3 mg/dL   GFR calc non Af Amer 27 (L) >60 mL/min   GFR calc Af Amer 31 (L) >60 mL/min    Comment: (NOTE) The eGFR has been calculated using the CKD EPI equation. This calculation has not been validated in all clinical situations. eGFR's persistently <60 mL/min signify possible Chronic Kidney Disease.    Anion gap 6 5 - 15  CBC     Status: Abnormal   Collection Time: 09/04/14 11:05 AM  Result Value Ref Range   WBC 4.5 3.8 - 10.6 K/uL   RBC 3.70 (L) 4.40 - 5.90 MIL/uL   Hemoglobin 10.9 (L) 13.0 - 18.0 g/dL   HCT 32.9 (L) 40.0 - 52.0 %   MCV 88.9 80.0 - 100.0 fL   MCH 29.6 26.0 - 34.0 pg   MCHC 33.3 32.0 - 36.0 g/dL   RDW 15.6 (H) 11.5 - 14.5 %   Platelets 243 150 - 440 K/uL  CK     Status: Abnormal   Collection Time: 09/04/14 11:05 AM  Result Value Ref Range   Total CK 805 (H) 49 - 397 U/L  Hepatic function panel     Status: Abnormal   Collection Time: 09/04/14 11:05 AM  Result Value Ref Range   Total Protein 7.4 6.5 - 8.1 g/dL   Albumin 3.1 (L) 3.5 - 5.0 g/dL   AST 34 15 - 41 U/L   ALT 23 17 - 63 U/L   Alkaline Phosphatase 66 38 - 126 U/L   Total Bilirubin 0.5 0.3 - 1.2 mg/dL   Bilirubin,  Direct <0.1 (L) 0.1 - 0.5 mg/dL   Indirect Bilirubin NOT CALCULATED 0.3 - 0.9 mg/dL  Urinalysis complete, with microscopic ([facility] only)     Status: Abnormal   Collection Time: 09/04/14  6:22 PM  Result Value Ref Range   Color, Urine YELLOW (A) YELLOW   APPearance CLEAR (A) CLEAR   Glucose, UA 50 (A) NEGATIVE mg/dL   Bilirubin Urine NEGATIVE NEGATIVE   Ketones, ur NEGATIVE NEGATIVE mg/dL   Specific Gravity, Urine 1.019 1.005 - 1.030   Hgb urine dipstick 1+ (A) NEGATIVE   pH 5.0 5.0 - 8.0   Protein, ur 100 (A) NEGATIVE mg/dL   Nitrite NEGATIVE NEGATIVE   Leukocytes, UA NEGATIVE NEGATIVE   RBC / HPF 0-5 0 - 5 RBC/hpf   WBC, UA 0-5 0 - 5 WBC/hpf   Bacteria, UA NONE SEEN NONE SEEN   Squamous Epithelial / LPF NONE SEEN NONE SEEN   Mucous PRESENT   TSH     Status: Abnormal   Collection Time: 09/05/14  4:56 AM  Result Value Ref Range   TSH 4.513 (H) 0.350 - 4.500 uIU/mL  Basic metabolic panel     Status: Abnormal   Collection Time: 09/05/14  4:56 AM  Result Value Ref Range   Sodium 140 135 - 145 mmol/L   Potassium 4.1 3.5 - 5.1 mmol/L   Chloride 105 101 - 111 mmol/L   CO2 29 22 - 32  mmol/L   Glucose, Bld 231 (H) 65 - 99 mg/dL   BUN 31 (H) 6 - 20 mg/dL   Creatinine, Ser 1.77 (H) 0.61 - 1.24 mg/dL   Calcium 8.8 (L) 8.9 - 10.3 mg/dL   GFR calc non Af Amer 34 (L) >60 mL/min   GFR calc Af Amer 40 (L) >60 mL/min    Comment: (NOTE) The eGFR has been calculated using the CKD EPI equation. This calculation has not been validated in all clinical situations. eGFR's persistently <60 mL/min signify possible Chronic Kidney Disease.    Anion gap 6 5 - 15  Sedimentation rate     Status: Abnormal   Collection Time: 09/05/14  4:56 AM  Result Value Ref Range   Sed Rate 108 (H) 0 - 20 mm/hr  Glucose, capillary     Status: Abnormal   Collection Time: 09/05/14  7:24 AM  Result Value Ref Range   Glucose-Capillary 245 (H) 65 - 99 mg/dL   Comment 1 Notify RN   Glucose, capillary     Status: Abnormal   Collection Time: 09/05/14 10:59 AM  Result Value Ref Range   Glucose-Capillary 215 (H) 65 - 99 mg/dL   Comment 1 Notify RN     Ct Head Wo Contrast  09/05/2014   CLINICAL DATA:  Bilateral lower extremity weakness and cramping for one day.  EXAM: CT HEAD WITHOUT CONTRAST  TECHNIQUE: Contiguous axial images were obtained from the base of the skull through the vertex without intravenous contrast.  COMPARISON:  Head CT 08/13/2014  FINDINGS: Generalized atrophy with ventriculomegaly and mild-moderate chronic small vessel ischemic change, stable from prior. No intracranial hemorrhage, mass effect, or midline shift. The basilar cisterns are patent. No evidence of territorial infarct. No intracranial fluid collection. Calvarium is intact. Included paranasal sinuses and mastoid air cells are well aerated.  IMPRESSION: Stable chronic change without acute intracranial abnormality.   Electronically Signed   By: Jeb Levering M.D.   On: 09/05/2014 00:12   US Venous Img Lower Bilateral  09/04/2014   CLINICAL DATA:  Bilateral lower extremity pain and swelling. History of deep venous thrombosis.  EXAM:  BILATERAL LOWER EXTREMITY VENOUS DOPPLER ULTRASOUND  TECHNIQUE: Gray-scale sonography with graded compression, as well as color Doppler and duplex ultrasound were performed to evaluate the lower extremity deep venous systems from the level of the common femoral vein and including the common femoral, femoral, profunda femoral, popliteal and calf veins including the posterior tibial, peroneal and gastrocnemius veins when visible. The superficial great saphenous vein was also interrogated. Spectral Doppler was utilized to evaluate flow at rest and with distal augmentation maneuvers in the common femoral, femoral and popliteal veins.  COMPARISON:  Venous lower extremity ultrasound 06/17/2013  FINDINGS: RIGHT LOWER EXTREMITY  Common Femoral Vein: No evidence of thrombus. Normal compressibility, respiratory phasicity and response to augmentation.  Saphenofemoral Junction: No evidence of thrombus. Normal compressibility and flow on color Doppler imaging.  Profunda Femoral Vein: No evidence of thrombus. Normal compressibility and flow on color Doppler imaging.  Femoral Vein: There is linear echogenic material within the lumen of the vessel. The vessel is compressible. There is flow within lumen.  Popliteal Vein: There is linear echogenic true of the lumen of the vessel. The vessel is compressible. There is flow within lumen.  Calf Veins: No evidence of thrombus. Normal compressibility and flow on color Doppler imaging.  Superficial Great Saphenous Vein: No evidence of thrombus. Normal compressibility and flow on color Doppler imaging.  LEFT LOWER EXTREMITY  Common Femoral Vein: No evidence of thrombus. Normal compressibility, respiratory phasicity and response to augmentation.  Saphenofemoral Junction: No evidence of thrombus. Normal compressibility and flow on color Doppler imaging.  Profunda Femoral Vein: No evidence of thrombus. Normal compressibility and flow on color Doppler imaging.  Femoral Vein: Linear echogenic  material within the lumen of the vessel. Vessels is compressible. There is flow within the vessel.  Popliteal Vein: No evidence of thrombus. Normal compressibility, respiratory phasicity and response to augmentation.  Calf Veins: No evidence of thrombus. Normal compressibility and flow on color Doppler imaging.  Superficial Great Saphenous Vein: No evidence of thrombus. Normal compressibility and flow on color Doppler imaging.  IMPRESSION: 1. Nonocclusive deep venous thrombosis within the right femoral vein and popliteal vein. 2. Nonocclusive deep venous thrombosis within the left femoral vein. These results will be called to the ordering clinician or representative by the Radiologist Assistant, and communication documented in the PACS or zVision Dashboard.   Electronically Signed   By: Suzy Bouchard M.D.   On: 09/04/2014 16:19    Review of Systems  Constitutional: Positive for malaise/fatigue. Negative for fever, chills, weight loss and diaphoresis.  HENT: Negative.   Eyes: Negative.   Respiratory: Negative.   Cardiovascular: Negative.   Gastrointestinal: Negative.   Genitourinary: Negative.   Musculoskeletal: Positive for myalgias and falls. Negative for back pain, joint pain and neck pain.  Skin: Negative.   Neurological: Positive for weakness. Negative for dizziness, tingling, tremors, sensory change, speech change, focal weakness and seizures.   Blood pressure 182/74, pulse 70, temperature 97.6 F (36.4 C), temperature source Oral, resp. rate 16, height 5' 7" (1.702 m), weight 76.386 kg (168 lb 6.4 oz), SpO2 100 %. Physical Exam  Constitutional: He is oriented to person, place, and time. He appears well-developed and well-nourished. No distress.  HENT:  Head: Normocephalic and atraumatic.  Nose: Nose normal.  Mouth/Throat: Oropharynx is clear and moist.  Eyes: EOM are normal. Pupils are equal, round, and reactive to light. No scleral icterus.  Neck: Normal range of  motion. Neck  supple.  Cardiovascular: Normal rate, regular rhythm and normal heart sounds.   No murmur heard. Respiratory: Effort normal and breath sounds normal. No respiratory distress.  GI: Soft. Bowel sounds are normal. He exhibits no distension. There is no tenderness.  Musculoskeletal: Normal range of motion. He exhibits tenderness.  Neurological: He is alert and oriented to person, place, and time. He has normal reflexes. He displays normal reflexes. No cranial nerve deficit. He exhibits normal muscle tone. Coordination normal.  Skin: Skin is dry. No rash noted.   CT of head shows atrophy and white matter changes   Assessment/Plan: 1.  Myositis-  This is likely due to statin usage but elevation of inflammatory markers shows that this could be worsening this.  Could be polymyositis vs. Statin induced.   2.  Dementia-  stable -  D/c zocor -  Start prednisone 39m daily and continue until outpatient neurology appointment -  D/c per PT recommendations -  Will sign off, [facility]Dr. SManuella Ghazifor [facility]) -  Call with additional questions  ,  09/05/2014, 1:41 PM

## 2014-09-05 NOTE — ED Provider Notes (Signed)
-----------------------------------------   1:31 AM on 09/05/2014 -----------------------------------------   Blood pressure 147/73, pulse 75, temperature 98.1 F (36.7 C), temperature source Oral, resp. rate 16, height  (1.702 m), weight 179 lb (81.194 kg), SpO2 95 %.  Assuming care from Dr. Pershing Proud.  In short, William Byrd is a 81 y.o. male with a chief complaint of Leg Pain .  Refer to the original H&P for additional details.  The current plan of care is to follow up the results of the CT scan and admit the patient to the hospital.  The patient's CT scan shows stable chronic change without acute intracranial abnormality. I did contact the hospitalist to admit the patient to their service. Upon reviewing further studies done earlier today it appears as though the patient had bilateral lower extremity Dopplers done which shows nonocclusive deep venous thrombosis within the right and left femoral vein and the right popliteal vein. Given the patient's history of dementia and weakness with falls I will not start the patient on anticoagulation. I discussed this with the hospitalist and we'll defer to them to determine the appropriate anticoagulation for the patient's DVTs. The patient be admitted to the hospital  Rebecka Apley, MD 09/05/14 [id number]

## 2014-09-05 NOTE — Evaluation (Signed)
Physical Therapy Evaluation Patient Details Name: William Byrd MRN: [id number] DOB: [birth year] Today's Date: 09/05/2014   History of Present Illness  81 yo male with onset of leg pain and weakness over the last 2-3 months was admitted and has BLE DVT's that are non occlusive and  PMHx:  dementia, diabetes mellitus type 2, hypertension, CK D3, spinal stenosis  Clinical Impression  Pt was able to be assisted to chair with PT and spoke with wife about SNF placement, and she is supportive. Her plan is to try to get back home with pt when able, to continue with mobility as able and will continue with caregiver support.    Follow Up Recommendations SNF    Equipment Recommendations  None recommended by PT    Recommendations for Other Services       Precautions / Restrictions Precautions Precautions: Fall Restrictions Weight Bearing Restrictions: No      Mobility  Bed Mobility Overal bed mobility: Needs Assistance Bed Mobility: Supine to Sit     Supine to sit: Max assist     General bed mobility comments: pt does not initiate at all and needs PT to cue reach and uses pad on bed to help slide out  Transfers Overall transfer level: Needs assistance Equipment used: Rolling walker (2 wheeled);1 person hand held assist (2 person would be better) Transfers: Sit to/from [employer] Sit to Stand: Max assist Stand pivot transfers: Mod assist;Max assist;+2 safety/equipment;+2 physical assistance       General transfer comment: reminders for exact hand placement  Ambulation/Gait             General Gait Details: sidesteps to transfer only  Stairs            Wheelchair Mobility    Modified Rankin (Stroke Patients Only)       Balance Overall balance assessment: Needs assistance Sitting-balance support: Feet supported;Bilateral upper extremity supported Sitting balance-Leahy Scale: Fair Sitting balance - Comments: weak and PT had to help pt find  midline Postural control: Left lateral lean Standing balance support: Bilateral upper extremity supported Standing balance-Leahy Scale: Poor                               Pertinent Vitals/Pain Pain Assessment: No/denies pain    Home Living Family/patient expects to be discharged to:: Skilled nursing facility Living Arrangements: Spouse/significant other                    Prior Function Level of Independence: Needs assistance   Gait / Transfers Assistance Needed: Wife and caregiver help him transfer bed to wc  ADL's / Homemaking Assistance Needed: WIfe cares for home        Hand Dominance        Extremity/Trunk Assessment   Upper Extremity Assessment: Overall WFL for tasks assessed           Lower Extremity Assessment: Generalized weakness      Cervical / Trunk Assessment: Normal  Communication   Communication: Other (comment) (clear speech but slow to respond)  Cognition Arousal/Alertness: Lethargic Behavior During Therapy: Flat affect Overall Cognitive Status: History of cognitive impairments - at baseline       Memory: Decreased recall of precautions;Decreased short-term memory              General Comments General comments (skin integrity, edema, etc.): Pt has limited abiltiy to assist his transitions to chair and to  control sitting, but decline has been ongoing for several months, worsened by DVT's    Exercises        Assessment/Plan    PT Assessment Patient needs continued PT services  PT Diagnosis Generalized weakness   PT Problem List Decreased strength;Decreased range of motion;Decreased activity tolerance;Decreased balance;Decreased mobility;Decreased coordination;Decreased cognition;Decreased safety awareness;Decreased knowledge of use of DME;Decreased knowledge of precautions  PT Treatment Interventions DME instruction;Gait training;Functional mobility training;Therapeutic activities;Therapeutic exercise;Balance  training;Neuromuscular re-education;Cognitive remediation;Patient/family education   PT Goals (Current goals can be found in the Care Plan section) Acute Rehab PT Goals Patient Stated Goal: to get in chair PT Goal Formulation: With patient/family Time For Goal Achievement: 09/19/14 Potential to Achieve Goals: Good    Frequency Min 2X/week   Barriers to discharge Inaccessible home environment;Decreased caregiver support (wife has inability to lift him now)      Co-evaluation               End of Session Equipment Utilized During Treatment: Gait belt Activity Tolerance: Patient tolerated treatment well Patient left: in chair;with call bell/phone within reach;with chair alarm set;with family/visitor present Nurse Communication: Mobility status         Time: 1100-1123 PT Time Calculation (min) (ACUTE ONLY): 23 min   Charges:   PT Evaluation $Initial PT Evaluation Tier I: 1 Procedure PT Treatments $Therapeutic Activity: 8-22 mins   PT G CodesIvar Drape 09/27/14, 11:52 AM   Samul Dada, PT MS Acute Rehab Dept. Number: [facility] [id number] and [facility] [phone]

## 2014-09-05 NOTE — Progress Notes (Signed)
Patient ID: William Byrd, male   DOB: January 20, 1932, 81 y.o.   MRN: [id number] [facility] PROGRESS NOTE  PCP: Marisue Ivan, MD  HPI/Subjective: Patient is a poor historian secondary to dementia. He does not complain of anything. No pain anywhere. I spoke with the wife on the phone and he was brought in for difficulty with ambulation and weakness. This is been gradual since April. He is now more using the wheelchair and not ambulating. He was unable to get out of the bed and brought in for further evaluation.  Objective: Filed Vitals:   09/05/14 0728  BP: 182/74  Pulse: 70  Temp: 97.6 F (36.4 C)  Resp: 16   No intake or output data in the 24 hours ending 09/05/14 0841 Filed Weights   09/04/14 1043 09/05/14 0431  Weight: 81.194 kg (179 lb) 76.386 kg (168 lb 6.4 oz)    ROS: Review of Systems  Constitutional: Negative for fever and chills.  Eyes: Negative for blurred vision.  Respiratory: Negative for cough and shortness of breath.   Cardiovascular: Negative for chest pain.  Gastrointestinal: Negative for nausea, vomiting, abdominal pain, diarrhea and constipation.  Genitourinary: Negative for dysuria.  Musculoskeletal: Negative for joint pain.  Neurological: Negative for dizziness and headaches.   Exam: Physical Exam  HENT:  Nose: No mucosal edema.  Mouth/Throat: No oropharyngeal exudate or posterior oropharyngeal edema.  Eyes: Conjunctivae, EOM and lids are normal. Pupils are equal, round, and reactive to light.  Neck: No JVD present. Carotid bruit is not present. No edema present. No thyroid mass and no thyromegaly present.  Cardiovascular: S1 normal and S2 normal.  Exam reveals no gallop.   No murmur heard. Pulses:      Dorsalis pedis pulses are 2+ on the right side, and 2+ on the left side.  Respiratory: No respiratory distress. He has no wheezes. He has no rhonchi. He has no rales.  GI: Soft. Bowel sounds are normal. There is no tenderness.   Musculoskeletal:       Right ankle: He exhibits swelling.       Left ankle: He exhibits swelling.  Lymphadenopathy:    He has no cervical adenopathy.  Neurological: He is alert. No cranial nerve deficit.  Patient able to straight leg raise getting both heels off the bed. Power 4+ out of 5 on the lower extremities.  Skin: Skin is warm. No rash noted. Nails show no clubbing.  Psychiatric: He has a normal mood and affect.    Data Reviewed: Basic Metabolic Panel:  Recent Labs Lab 09/04/14 1105 09/05/14 0456  NA 137 140  K 4.1 4.1  CL 103 105  CO2 28 29  GLUCOSE 111* 231*  BUN 33* 31*  CREATININE 2.17* 1.77*  CALCIUM 9.0 8.8*   Liver Function Tests:  Recent Labs Lab 09/04/14 1105  AST 34  ALT 23  ALKPHOS 66  BILITOT 0.5  PROT 7.4  ALBUMIN 3.1*   CBC:  Recent Labs Lab 09/04/14 1105  WBC 4.5  HGB 10.9*  HCT 32.9*  MCV 88.9  PLT 243   Cardiac Enzymes:  Recent Labs Lab 09/04/14 1105  CKTOTAL 805*   CBG:  Recent Labs Lab 09/05/14 0724  GLUCAP 245*   Studies: Ct Head Wo Contrast  09/05/2014   CLINICAL DATA:  Bilateral lower extremity weakness and cramping for one day.  EXAM: CT HEAD WITHOUT CONTRAST  TECHNIQUE: Contiguous axial images were obtained from the base of the skull through the vertex without intravenous  contrast.  COMPARISON:  Head CT 08/13/2014  FINDINGS: Generalized atrophy with ventriculomegaly and mild-moderate chronic small vessel ischemic change, stable from prior. No intracranial hemorrhage, mass effect, or midline shift. The basilar cisterns are patent. No evidence of territorial infarct. No intracranial fluid collection. Calvarium is intact. Included paranasal sinuses and mastoid air cells are well aerated.  IMPRESSION: Stable chronic change without acute intracranial abnormality.   Electronically Signed   By: Rubye Oaks M.D.   On: 09/05/2014 00:12   US Venous Img Lower Bilateral  09/04/2014   CLINICAL DATA:  Bilateral lower  extremity pain and swelling. History of deep venous thrombosis.  EXAM: BILATERAL LOWER EXTREMITY VENOUS DOPPLER ULTRASOUND  TECHNIQUE: Gray-scale sonography with graded compression, as well as color Doppler and duplex ultrasound were performed to evaluate the lower extremity deep venous systems from the level of the common femoral vein and including the common femoral, femoral, profunda femoral, popliteal and calf veins including the posterior tibial, peroneal and gastrocnemius veins when visible. The superficial great saphenous vein was also interrogated. Spectral Doppler was utilized to evaluate flow at rest and with distal augmentation maneuvers in the common femoral, femoral and popliteal veins.  COMPARISON:  Venous lower extremity ultrasound 06/17/2013  FINDINGS: RIGHT LOWER EXTREMITY  Common Femoral Vein: No evidence of thrombus. Normal compressibility, respiratory phasicity and response to augmentation.  Saphenofemoral Junction: No evidence of thrombus. Normal compressibility and flow on color Doppler imaging.  Profunda Femoral Vein: No evidence of thrombus. Normal compressibility and flow on color Doppler imaging.  Femoral Vein: There is linear echogenic material within the lumen of the vessel. The vessel is compressible. There is flow within lumen.  Popliteal Vein: There is linear echogenic true of the lumen of the vessel. The vessel is compressible. There is flow within lumen.  Calf Veins: No evidence of thrombus. Normal compressibility and flow on color Doppler imaging.  Superficial Great Saphenous Vein: No evidence of thrombus. Normal compressibility and flow on color Doppler imaging.  LEFT LOWER EXTREMITY  Common Femoral Vein: No evidence of thrombus. Normal compressibility, respiratory phasicity and response to augmentation.  Saphenofemoral Junction: No evidence of thrombus. Normal compressibility and flow on color Doppler imaging.  Profunda Femoral Vein: No evidence of thrombus. Normal compressibility  and flow on color Doppler imaging.  Femoral Vein: Linear echogenic material within the lumen of the vessel. Vessels is compressible. There is flow within the vessel.  Popliteal Vein: No evidence of thrombus. Normal compressibility, respiratory phasicity and response to augmentation.  Calf Veins: No evidence of thrombus. Normal compressibility and flow on color Doppler imaging.  Superficial Great Saphenous Vein: No evidence of thrombus. Normal compressibility and flow on color Doppler imaging.  IMPRESSION: 1. Nonocclusive deep venous thrombosis within the right femoral vein and popliteal vein. 2. Nonocclusive deep venous thrombosis within the left femoral vein. These results will be called to the ordering clinician or representative by the Radiologist Assistant, and communication documented in the PACS or zVision Dashboard.   Electronically Signed   By: Genevive Bi M.D.   On: 09/04/2014 16:19    Scheduled Meds: . amLODipine  5 mg Oral Daily  . aspirin EC  81 mg Oral Daily  . cholecalciferol  1,000 Units Oral Daily  . donepezil  10 mg Oral QHS  . DULoxetine  60 mg Oral Daily  . ezetimibe  10 mg Oral Daily  . finasteride  5 mg Oral Daily  . furosemide  20 mg Oral Daily  . insulin aspart  0-15  Units Subcutaneous TID WC  . insulin detemir  20 Units Subcutaneous QHS  . linagliptin  5 mg Oral Daily  . memantine  28 mg Oral Daily  . pregabalin  75 mg Oral BID  . PROSTATE SR  1 capsule Oral Daily  . rivaroxaban  20 mg Oral Daily   Continuous Infusions: . sodium chloride 50 mL/hr at 09/05/14 0439    Assessment/Plan:  1. Bilateral lower extremity weakness. We'll get physical therapy evaluation. May end up needing rehabilitation. Since CPK is up at 807, I will discontinue Zocor since this could be a mild rhabdomyolysis. 2. Bilateral lower extremity DVT- he is on Xarelto. 3. Acute renal failure on chronic kidney disease continue gentle IV fluid hydration. 4. Hyperlipidemia unspecified-  discontinue Zocor continue Zetia. 5. Type 2 diabetes mellitus- continue detemir insulin and aspart insulin 6. BPH- continue finasteride 7. Dementia continue Aricept and other psychiatric medications.  Code Status:     Code Status Orders        Start     Ordered   09/05/14 0410  Full code   Continuous     09/05/14 0409    Advance Directive Documentation        Most Recent Value   Type of Advance Directive  Healthcare Power of Attorney   Pre-existing out of facility DNR order (yellow form or pink MOST form)     "MOST" Form in Place?       Family Communication: Wife on phone Disposition Plan: May need rehabilitation  Time spent: 35 minutes in coordination of care.  [name]  [facility]

## 2014-09-06 ENCOUNTER — Inpatient Hospital Stay: Payer: Medicare Other

## 2014-09-06 LAB — BASIC METABOLIC PANEL
Anion gap: 5 (ref 5–15)
BUN: 18 mg/dL (ref 6–20)
CALCIUM: 8.8 mg/dL — AB (ref 8.9–10.3)
CO2: 30 mmol/L (ref 22–32)
Chloride: 107 mmol/L (ref 101–111)
Creatinine, Ser: 1.57 mg/dL — ABNORMAL HIGH (ref 0.61–1.24)
GFR calc non Af Amer: 40 mL/min — ABNORMAL LOW (ref >60)
GFR, EST AFRICAN AMERICAN: 46 mL/min — AB (ref >60)
GLUCOSE: 145 mg/dL — AB (ref 65–99)
POTASSIUM: 4.2 mmol/L (ref 3.5–5.1)
Sodium: 142 mmol/L (ref 135–145)

## 2014-09-06 LAB — CK: Total CK: 259 U/L (ref 49–397)

## 2014-09-06 LAB — GLUCOSE, CAPILLARY
GLUCOSE-CAPILLARY: 410 mg/dL — AB (ref 65–99)
Glucose-Capillary: 122 mg/dL — ABNORMAL HIGH (ref 65–99)
Glucose-Capillary: 163 mg/dL — ABNORMAL HIGH (ref 65–99)
Glucose-Capillary: 424 mg/dL — ABNORMAL HIGH (ref 65–99)
Glucose-Capillary: 404 mg/dL — ABNORMAL HIGH (ref 65–99)
Glucose-Capillary: 260 mg/dL — ABNORMAL HIGH (ref 65–99)

## 2014-09-06 LAB — MYOGLOBIN, SERUM: Myoglobin: 215 ng/mL — ABNORMAL HIGH (ref 28–72)

## 2014-09-06 MED ORDER — PREDNISOLONE ACETATE 1 % OP SUSP
1.0000 [drp] | Freq: Two times a day (BID) | OPHTHALMIC | Status: DC
  Filled 2014-09-06: qty 1
  Filled 2014-09-06: qty 5

## 2014-09-06 MED ORDER — INSULIN ASPART 100 UNIT/ML IV SOLN
20.0000 [IU] | Freq: Once | INTRAVENOUS | Status: AC
  Administered 2014-09-06: 20 [IU] via INTRAVENOUS
  Filled 2014-09-06 (×3): qty 0.2

## 2014-09-06 MED ORDER — INSULIN ASPART 100 UNIT/ML ~~LOC~~ SOLN
6.0000 [IU] | Freq: Three times a day (TID) | SUBCUTANEOUS | Status: DC
  Administered 2014-09-06 – 2014-09-07 (×2): 6 [IU] via SUBCUTANEOUS
  Filled 2014-09-06: qty 6

## 2014-09-06 MED ORDER — IPRATROPIUM-ALBUTEROL 0.5-2.5 (3) MG/3ML IN SOLN
3.0000 mL | Freq: Four times a day (QID) | RESPIRATORY_TRACT | Status: DC
  Administered 2014-09-06: 3 mL via RESPIRATORY_TRACT
  Filled 2014-09-06 (×2): qty 3

## 2014-09-06 MED ORDER — AMLODIPINE BESYLATE 10 MG PO TABS
10.0000 mg | ORAL_TABLET | Freq: Every day | ORAL | Status: DC
  Administered 2014-09-07 – 2014-09-08 (×2): 10 mg via ORAL
  Filled 2014-09-06 (×2): qty 1

## 2014-09-06 MED ORDER — PREDNISOLONE ACETATE 1 % OP SUSP
1.0000 [drp] | Freq: Two times a day (BID) | OPHTHALMIC | Status: DC
  Administered 2014-09-06 – 2014-09-08 (×4): 1 [drp] via OPHTHALMIC
  Filled 2014-09-06: qty 1

## 2014-09-06 NOTE — Progress Notes (Addendum)
Patient ID: William Byrd, male   DOB: 09/01/1931, 81 y.o.   MRN: [id number] [facility] PROGRESS NOTE  PCP: Marisue Ivan, MD  HPI/Subjective: Patient doesn't remember me from yesterday. Patient is not the best historian with dementia. He feels okay and offers no complaints.   Objective: Filed Vitals:   09/06/14 0735  BP: 160/57  Pulse: 54  Temp: 98.1 F (36.7 C)  Resp: 16    Intake/Output Summary (Last 24 hours) at 09/06/14 1506 Last data filed at 09/06/14 1300  Gross per 24 hour  Intake   1080 ml  Output      0 ml  Net   1080 ml   Filed Weights   09/04/14 1043 09/05/14 0431 09/06/14 0437  Weight: 81.194 kg (179 lb) 76.386 kg (168 lb 6.4 oz) 75.615 kg (166 lb 11.2 oz)    ROS: Review of Systems  Constitutional: Negative for fever and chills.  Eyes: Negative for blurred vision.  Respiratory: Negative for cough and shortness of breath.   Cardiovascular: Negative for chest pain.  Gastrointestinal: Negative for nausea, vomiting, abdominal pain, diarrhea and constipation.  Genitourinary: Negative for dysuria.  Musculoskeletal: Negative for joint pain.  Neurological: Negative for dizziness and headaches.   Exam: Physical Exam  HENT:  Nose: No mucosal edema.  Mouth/Throat: No oropharyngeal exudate or posterior oropharyngeal edema.  Eyes: Conjunctivae, EOM and lids are normal. Pupils are equal, round, and reactive to light.  Neck: No JVD present. Carotid bruit is not present. No edema present. No thyroid mass and no thyromegaly present.  Cardiovascular: S1 normal and S2 normal.  Exam reveals no gallop.   No murmur heard. Pulses:      Dorsalis pedis pulses are 2+ on the right side, and 2+ on the left side.  Respiratory: No respiratory distress. He has decreased breath sounds in the left upper field, the left middle field and the left lower field. He has no wheezes. He has rhonchi in the left upper field, the left middle field and the left lower field.  He has no rales.  GI: Soft. Bowel sounds are normal. There is no tenderness.  Musculoskeletal:       Right ankle: He exhibits swelling.       Left ankle: He exhibits swelling.  Lymphadenopathy:    He has no cervical adenopathy.  Neurological: He is alert. No cranial nerve deficit.  Patient able to straight leg raise getting both heels off the bed. Power 4+ out of 5 on the lower extremities.  Skin: Skin is warm. No rash noted. Nails show no clubbing.  Psychiatric: He has a normal mood and affect.    Data Reviewed: Basic Metabolic Panel:  Recent Labs Lab 09/04/14 1105 09/05/14 0456 09/06/14 0621  NA 137 140 142  K 4.1 4.1 4.2  CL 103 105 107  CO2 28 29 30   GLUCOSE 111* 231* 145*  BUN 33* 31* 18  CREATININE 2.17* 1.77* 1.57*  CALCIUM 9.0 8.8* 8.8*   Liver Function Tests:  Recent Labs Lab 09/04/14 1105  AST 34  ALT 23  ALKPHOS 66  BILITOT 0.5  PROT 7.4  ALBUMIN 3.1*   CBC:  Recent Labs Lab 09/04/14 1105  WBC 4.5  HGB 10.9*  HCT 32.9*  MCV 88.9  PLT 243   Cardiac Enzymes:  Recent Labs Lab 09/04/14 1105 09/06/14 0621  CKTOTAL 805* 259   CBG:  Recent Labs Lab 09/05/14 1059 09/05/14 1503 09/05/14 2127 09/06/14 0825 09/06/14 1115  GLUCAP 215*  240* 205* 122* 163*   Studies: Ct Head Wo Contrast  09/05/2014   CLINICAL DATA:  Bilateral lower extremity weakness and cramping for one day.  EXAM: CT HEAD WITHOUT CONTRAST  TECHNIQUE: Contiguous axial images were obtained from the base of the skull through the vertex without intravenous contrast.  COMPARISON:  Head CT 08/13/2014  FINDINGS: Generalized atrophy with ventriculomegaly and mild-moderate chronic small vessel ischemic change, stable from prior. No intracranial hemorrhage, mass effect, or midline shift. The basilar cisterns are patent. No evidence of territorial infarct. No intracranial fluid collection. Calvarium is intact. Included paranasal sinuses and mastoid air cells are well aerated.  IMPRESSION:  Stable chronic change without acute intracranial abnormality.   Electronically Signed   By: Rubye Oaks M.D.   On: 09/05/2014 00:12   US Venous Img Lower Bilateral  09/04/2014   CLINICAL DATA:  Bilateral lower extremity pain and swelling. History of deep venous thrombosis.  EXAM: BILATERAL LOWER EXTREMITY VENOUS DOPPLER ULTRASOUND  TECHNIQUE: Gray-scale sonography with graded compression, as well as color Doppler and duplex ultrasound were performed to evaluate the lower extremity deep venous systems from the level of the common femoral vein and including the common femoral, femoral, profunda femoral, popliteal and calf veins including the posterior tibial, peroneal and gastrocnemius veins when visible. The superficial great saphenous vein was also interrogated. Spectral Doppler was utilized to evaluate flow at rest and with distal augmentation maneuvers in the common femoral, femoral and popliteal veins.  COMPARISON:  Venous lower extremity ultrasound 06/17/2013  FINDINGS: RIGHT LOWER EXTREMITY  Common Femoral Vein: No evidence of thrombus. Normal compressibility, respiratory phasicity and response to augmentation.  Saphenofemoral Junction: No evidence of thrombus. Normal compressibility and flow on color Doppler imaging.  Profunda Femoral Vein: No evidence of thrombus. Normal compressibility and flow on color Doppler imaging.  Femoral Vein: There is linear echogenic material within the lumen of the vessel. The vessel is compressible. There is flow within lumen.  Popliteal Vein: There is linear echogenic true of the lumen of the vessel. The vessel is compressible. There is flow within lumen.  Calf Veins: No evidence of thrombus. Normal compressibility and flow on color Doppler imaging.  Superficial Great Saphenous Vein: No evidence of thrombus. Normal compressibility and flow on color Doppler imaging.  LEFT LOWER EXTREMITY  Common Femoral Vein: No evidence of thrombus. Normal compressibility, respiratory  phasicity and response to augmentation.  Saphenofemoral Junction: No evidence of thrombus. Normal compressibility and flow on color Doppler imaging.  Profunda Femoral Vein: No evidence of thrombus. Normal compressibility and flow on color Doppler imaging.  Femoral Vein: Linear echogenic material within the lumen of the vessel. Vessels is compressible. There is flow within the vessel.  Popliteal Vein: No evidence of thrombus. Normal compressibility, respiratory phasicity and response to augmentation.  Calf Veins: No evidence of thrombus. Normal compressibility and flow on color Doppler imaging.  Superficial Great Saphenous Vein: No evidence of thrombus. Normal compressibility and flow on color Doppler imaging.  IMPRESSION: 1. Nonocclusive deep venous thrombosis within the right femoral vein and popliteal vein. 2. Nonocclusive deep venous thrombosis within the left femoral vein. These results will be called to the ordering clinician or representative by the Radiologist Assistant, and communication documented in the PACS or zVision Dashboard.   Electronically Signed   By: Genevive Bi M.D.   On: 09/04/2014 16:19    Scheduled Meds: . amLODipine  5 mg Oral Daily  . aspirin EC  81 mg Oral Daily  . cholecalciferol  1,000 Units Oral Daily  . donepezil  10 mg Oral QHS  . DULoxetine  60 mg Oral Daily  . ezetimibe  10 mg Oral Daily  . finasteride  5 mg Oral Daily  . furosemide  20 mg Oral Daily  . insulin aspart  0-15 Units Subcutaneous TID WC  . insulin detemir  20 Units Subcutaneous QHS  . linagliptin  5 mg Oral Daily  . memantine  28 mg Oral Daily  . metoprolol tartrate  25 mg Oral BID  . prednisoLONE acetate  1 drop Right Eye BID  . predniSONE  20 mg Oral Q breakfast  . pregabalin  75 mg Oral BID  . PROSTATE SR  1 capsule Oral Daily  . rivaroxaban  20 mg Oral Daily     Assessment/Plan:  1. Bilateral lower extremity weakness. Physical therapy recommended rehabilitation, with insurance the  patient needs a 3 night hospital stay. Family has selected [facility]. I will discharge Friday morning.  Mild rhabdomyolysis improved with IV fluid hydration overnight. Possible myopathy, and neurology recommended redness on 20 mg and EMG as outpatient. 2. Bilateral lower extremity DVT- he is on Xarelto. 3. Acute renal failure on chronic kidney disease- improved with hydration continue to monitor. 4. Hyperlipidemia unspecified- discontinue Zocor continue Zetia. 5. Type 2 diabetes mellitus- continue detemir insulin and aspart insulin prior to meals. 6. BPH- continue finasteride 7. Dementia continue Aricept and other psychiatric medications. 8. Rhonchi left lung- will start nebulizer treatment and get a chest x-ray.  Code Status:     Code Status Orders        Start     Ordered   09/05/14 0410  Full code   Continuous     09/05/14 0409    Advance Directive Documentation        Most Recent Value   Type of Advance Directive  Healthcare Power of Attorney   Pre-existing out of facility DNR order (yellow form or pink MOST form)     "MOST" Form in Place?       Family Communication: Wife at bedside earlier. Disposition Plan: May need rehabilitation  Time spent: 20 minutes  [name]  [facility]

## 2014-09-06 NOTE — Progress Notes (Signed)
Inpatient Diabetes Program Recommendations  AACE/ADA: New Consensus Statement on Inpatient Glycemic Control (2013)  Target Ranges:  Prepandial:   less than 140 mg/dL      Peak postprandial:   less than 180 mg/dL (1-2 hours)      Critically ill patients:  140 - 180 mg/dL   Results for DIGBY, GROENEVELD (MRN [id number]) as of 09/06/2014 09:38  Ref. Range 09/05/2014 07:24 09/05/2014 10:59 09/05/2014 15:03 09/05/2014 21:27 09/06/2014 08:25  Glucose-Capillary Latest Ref Range: 65-99 mg/dL 409 (H) 811 (H) 914 (H) 205 (H) 122 (H)   Diabetes history: DM2 Outpatient Diabetes medications: Lantus 20 units QHS, Humalog 10 units with breakfast, 10 units with lunch, 15 units with supper, Tradjenta 5 mg daily Current orders for Inpatient glycemic control: Levemir 20 units QHS, Novolog 0-15 units TID with meals, Tradjenta 5 mg daily  Inpatient Diabetes Program Recommendations Insulin - Meal Coverage: Noted patient was started on Prednisone 20 mg QAM and received first dose this morning which is going to contribute to hyperglycemia. Please consider ordering Novolog 6 units TID with meals for meal coverage (in addition to Novolog correction).  Thanks, Orlando Penner, RN, MSN, CCRN, CDE Diabetes Coordinator Inpatient Diabetes Program [phone] (Team Pager from 8am to 5pm) [phone] ([facility] office) [phone] [facility] office) [phone] [facility] office)

## 2014-09-06 NOTE — Progress Notes (Signed)
Clinical Social Worker (CSW) met with patient this morning and presented bed offers. Wife was not at bedside. Patient reported that he does not have a preference and will discuss offers with his wife when she gets to the hospital. [name] will continue to follow and assist as needed.   Blima Rich, [place] [phone]

## 2014-09-06 NOTE — Progress Notes (Signed)
Patient very pleasant and cooperative but has no short term memory.  Patient worked with physical therapy and up to chair AX2 with constant verbal reminders on what to do next.  Wife arrived and provided a home med but we still need to get his PROSTATE home medication as pharmacy does not have, patient will need to provide.  Plan to discharge to [facility] Friday.

## 2014-09-06 NOTE — Progress Notes (Signed)
   09/06/14 2200  Clinical Encounter Type  Visited With Patient  Visit Type Spiritual support  Spiritual Encounters  Spiritual Needs Prayer  Stress Factors  Patient Stress Factors Health changes   Status: Lower extremity weakness Family: none present Faith: Christian Age/Sex: 37 male Visit Assessment: The patient shared, before his finger stick, that he was here to get checked out. The patient's sugar was elevated. He said his mom, sister, are very supportive and they check on him.  [facility] pager [phone] or submit online

## 2014-09-06 NOTE — Progress Notes (Signed)
Clinical Social Worker (CSW) met with patient's wife to get SNF choice. Wife chose [facility]. Plan is for patient to D/C to [facility] Friday 09/08/14. [facility] admissions coordinator at [facility] is aware of above. Per Florentina Jenny she will send [employer] employee to complete admissions paper work with patient's wife Friday morning. RN and MD aware of above. CSW will continue to follow and assist as needed.   Blima Rich, [place] [phone]

## 2014-09-06 NOTE — Progress Notes (Signed)
Physical Therapy Treatment Patient Details Name: William Byrd MRN: [id number] DOB: 06/09/1931 Today's Date: 09/06/2014    History of Present Illness 81 yo male with onset of leg pain and weakness over the last 2-3 months was admitted and has BLE DVT's that are non occlusive and  PMHx:  dementia, diabetes mellitus type 2, hypertension, CK D3, spinal stenosis    PT Comments    Pt is moving along toward more independence with gait and transfers but needs extra time and recuing to make his standing transition to chair from bed.  Has wife available to see what his current level of mobility is, with a plan to continue OOB to chair and strengthening/gait as able to tolerate.  Follow Up Recommendations  SNF     Equipment Recommendations  None recommended by PT    Recommendations for Other Services       Precautions / Restrictions Precautions Precautions: Fall Restrictions Weight Bearing Restrictions: No    Mobility  Bed Mobility Overal bed mobility: Needs Assistance Bed Mobility: Supine to Sit     Supine to sit: Mod assist     General bed mobility comments: better follow through once PT helps him to sequence  Transfers Overall transfer level: Needs assistance Equipment used: Rolling walker (2 wheeled);1 person hand held assist Transfers: Sit to/from [employer] Sit to Stand: Max assist Stand pivot transfers: Mod assist       General transfer comment: reminders for exact hand placement  Ambulation/Gait Ambulation/Gait assistance: Mod assist Ambulation Distance (Feet): 4 Feet Assistive device: Rolling walker (2 wheeled);1 person hand held assist Gait Pattern/deviations: Step-to pattern;Wide base of support;Trunk flexed Gait velocity: reduced Gait velocity interpretation: Below normal speed for age/gender General Gait Details: sidesteps with better trunk control and placment of his body to sit, reaching back    Stairs            Wheelchair  Mobility    Modified Rankin (Stroke Patients Only)       Balance Overall balance assessment: Needs assistance Sitting-balance support: Feet supported Sitting balance-Leahy Scale: Good Sitting balance - Comments: controlled sitting immediately Postural control: Posterior lean Standing balance support: Bilateral upper extremity supported Standing balance-Leahy Scale: Poor Standing balance comment: improved to fair at chair once pt finished transition to chair                    Cognition Arousal/Alertness: Awake/alert Behavior During Therapy: Flat affect Overall Cognitive Status: History of cognitive impairments - at baseline       Memory: Decreased recall of precautions;Decreased short-term memory              Exercises      General Comments General comments (skin integrity, edema, etc.): More alert today and more participatory with conversation and interaction that all bode well for success with rehab      Pertinent Vitals/Pain Pain Assessment: No/denies pain    Home Living                      Prior Function            PT Goals (current goals can now be found in the care plan section) Acute Rehab PT Goals Patient Stated Goal: to get to chair Progress towards PT goals: Progressing toward goals    Frequency  Min 2X/week    PT Plan Current plan remains appropriate    Co-evaluation  End of Session Equipment Utilized During Treatment: Gait belt Activity Tolerance: Patient tolerated treatment well Patient left: in chair;with call bell/phone within reach;with chair alarm set;with family/visitor present     Time: 9604-5409 PT Time Calculation (min) (ACUTE ONLY): 29 min  Charges:  $Gait Training: 8-22 mins $Therapeutic Activity: 8-22 mins                    G Codes:      Ivar Drape Sep 13, 2014, 3:59 PM   Samul Dada, PT MS Acute Rehab Dept. Number: [facility] [id number] and [facility] [phone]

## 2014-09-06 NOTE — Progress Notes (Signed)
Paged MD again for reading for glucose of 424.  At dinner time reading was 410 and gave 15 units standing and an additional 6 units.  Pending call back for orders.

## 2014-09-07 LAB — GLUCOSE, CAPILLARY
GLUCOSE-CAPILLARY: 89 mg/dL (ref 65–99)
GLUCOSE-CAPILLARY: 164 mg/dL — AB (ref 65–99)
GLUCOSE-CAPILLARY: 281 mg/dL — AB (ref 65–99)
Glucose-Capillary: 239 mg/dL — ABNORMAL HIGH (ref 65–99)

## 2014-09-07 MED ORDER — IPRATROPIUM-ALBUTEROL 0.5-2.5 (3) MG/3ML IN SOLN: 3.0000 mL | Freq: Four times a day (QID) | RESPIRATORY_TRACT | Status: DC | PRN

## 2014-09-07 NOTE — Care Management Important Message (Signed)
Important Message  Patient Details  Name: William Byrd MRN: [id number] Date of Birth: Oct 13, 1931   Medicare Important Message Given:  Yes-second notification given    [name] 09/07/2014, 9:38 AM

## 2014-09-07 NOTE — Plan of Care (Signed)
Problem: Phase I Progression Outcomes Goal: OOB as tolerated unless otherwise ordered Outcome: Completed/Met Date Met:  09/07/14 09/06/2014

## 2014-09-07 NOTE — Progress Notes (Signed)
Patient ID: William Byrd, male   DOB: 22-Sep-1931, 81 y.o.   MRN: [id number] [name]  PCP: Marisue Ivan, MD  HPI/Subjective: Patient again doesn't remember me from yesterday. Patient is not the best historian with dementia. He feels okay and offers no complaints. Wife states that he sometimes gets ccoked on food or pills or coughs when eating   Objective: Filed Vitals:   09/07/14 0944  BP: 152/60  Pulse:   Temp:   Resp:     Filed Weights   09/05/14 0431 09/06/14 0437 09/07/14 0352  Weight: 76.386 kg (168 lb 6.4 oz) 75.615 kg (166 lb 11.2 oz) 76.975 kg (169 lb 11.2 oz)    ROS: Review of Systems  Constitutional: Negative for fever and chills.  Eyes: Negative for blurred vision.  Respiratory: Negative for cough and shortness of breath.   Cardiovascular: Negative for chest pain.  Gastrointestinal: Negative for nausea, vomiting, abdominal pain, diarrhea and constipation.  Genitourinary: Negative for dysuria.  Musculoskeletal: Negative for joint pain.  Neurological: Negative for dizziness and headaches.   Exam: Physical Exam  HENT:  Nose: No mucosal edema.  Mouth/Throat: No oropharyngeal exudate or posterior oropharyngeal edema.  Eyes: Conjunctivae, EOM and lids are normal. Pupils are equal, round, and reactive to light.  Neck: No JVD present. Carotid bruit is not present. No edema present. No thyroid mass and no thyromegaly present.  Cardiovascular: S1 normal and S2 normal.  Exam reveals no gallop.   No murmur heard. Pulses:      Dorsalis pedis pulses are 2+ on the right side, and 2+ on the left side.  Respiratory: No respiratory distress. He has no decreased breath sounds. He has no wheezes. He has no rhonchi. He has no rales.  Some upper airway congestion in throat  GI: Soft. Bowel sounds are normal. There is no tenderness.  Musculoskeletal:       Right ankle: He exhibits swelling.       Left ankle: He exhibits swelling.   Lymphadenopathy:    He has no cervical adenopathy.  Neurological: He is alert. No cranial nerve deficit.  Patient able to straight leg raise getting both heels off the bed. Power 4+ out of 5 on the lower extremities.  Skin: Skin is warm. No rash noted. Nails show no clubbing.  Psychiatric: He has a normal mood and affect.    Data Reviewed: Basic Metabolic Panel:  Recent Labs Lab 09/04/14 1105 09/05/14 0456 09/06/14 0621  NA 137 140 142  K 4.1 4.1 4.2  CL 103 105 107  CO2 GLUCOSE 111* 231* 145*  BUN 33* 31* 18  CREATININE 2.17* 1.77* 1.57*  CALCIUM 9.0 8.8* 8.8*   Liver Function Tests:  Recent Labs Lab 09/04/14 1105  AST 34  ALT 23  ALKPHOS 66  BILITOT 0.5  PROT 7.4  ALBUMIN 3.1*   CBC:  Recent Labs Lab 09/04/14 1105  WBC 4.5  HGB 10.9*  HCT 32.9*  MCV 88.9  PLT 243   Cardiac Enzymes:  Recent Labs Lab 09/04/14 1105 09/06/14 0621  CKTOTAL 805* 259   CBG:  Recent Labs Lab 09/06/14 1838 09/06/14 1945 09/06/14 2120 09/07/14 0731 09/07/14 1148  GLUCAP 424* 404* 260* 89 164*   Studies: Dg Chest Port 1 View  09/06/2014   CLINICAL DATA:  Abnormal breath sounds.  Diabetes, hypertension.  EXAM: PORTABLE CHEST - 1 VIEW  COMPARISON:  08/13/2014  FINDINGS: Scarring in the lingula at the left lung  base. Right lung is clear. Heart is normal size, mediastinal contours within normal limits. No effusions. Calcifications in the left upper quadrant the abdomen compatible with granulomas within the spleen.  IMPRESSION: Lingular scarring.  No active disease.   Electronically Signed   By: Charlett Nose M.D.   On: 09/06/2014 15:42    Scheduled Meds: . amLODipine  10 mg Oral Daily  . aspirin EC  81 mg Oral Daily  . cholecalciferol  1,000 Units Oral Daily  . donepezil  10 mg Oral QHS  . DULoxetine  60 mg Oral Daily  . ezetimibe  10 mg Oral Daily  . finasteride  5 mg Oral Daily  . furosemide  20 mg Oral Daily  . insulin aspart  0-15 Units Subcutaneous  TID WC  . insulin detemir  20 Units Subcutaneous QHS  . linagliptin  5 mg Oral Daily  . memantine  28 mg Oral Daily  . metoprolol tartrate  25 mg Oral BID  . prednisoLONE acetate  1 drop Right Eye BID  . predniSONE  20 mg Oral Q breakfast  . pregabalin  75 mg Oral BID  . PROSTATE SR  1 capsule Oral Daily  . rivaroxaban  20 mg Oral Daily     Assessment/Plan:  1. Bilateral lower extremity weakness. Physical therapy recommended rehabilitation, with insurance the patient needs a 3 night hospital stay. Will discharge to [facility] on  Friday morning.  Mild rhabdomyolysis improved with IV fluid hydration overnight. Possible myopathy, and neurology recommended prednisone on 20 mg and EMG as outpatient. 2. Bilateral lower extremity DVT- he is on Xarelto. 3. Acute renal failure on chronic kidney disease- improved with hydration continue to monitor. 4. Hyperlipidemia unspecified- discontinue Zocor continue Zetia. 5. Type 2 diabetes mellitus- continue detemir insulin and aspart insulin prior to meals. 6. BPH- continue finasteride 7. Dementia continue Aricept and other psychiatric medications.  Code Status:     Code Status Orders        Start     Ordered   09/05/14 0410  Full code   Continuous     09/05/14 0409    Advance Directive Documentation        Most Recent Value   Type of Advance Directive  Healthcare Power of Attorney   Pre-existing out of facility DNR order (yellow form or pink MOST form)     "MOST" Form in Place?       Family Communication: Wife at bedside. Disposition Plan:  Rehabilitation Friday  Time spent: 20 minutes  [name]  [name]

## 2014-09-07 NOTE — Progress Notes (Signed)
Physical Therapy Treatment Patient Details Name: William Byrd MRN: [id number] DOB: [birth year] Today's Date: 09/07/2014    History of Present Illness 81 yo male with onset of leg pain and weakness over the last 2-3 months was admitted and has BLE DVT's that are non occlusive and  PMHx:  dementia, diabetes mellitus type 2, hypertension, CK D3, spinal stenosis    PT Comments    Pt making progress towards goals this PM. Pt able to ambulate greater distances and perform transfers and bed mobility with greater independence. Pt was pleasant and very cooperative with PT and states being ready to get started at rehab. He will continue to benefit from skilled PT to address his generalized weakness and decreased activity tolerance.   Follow Up Recommendations  SNF     Equipment Recommendations  None recommended by PT    Recommendations for Other Services       Precautions / Restrictions Precautions Precautions: Fall Restrictions Weight Bearing Restrictions: No    Mobility  Bed Mobility Overal bed mobility: Needs Assistance Bed Mobility: Supine to Sit     Supine to sit: Mod assist     General bed mobility comments: Pt requires assist for body weight support as well as some LE facilitation off of the bed.   Transfers Overall transfer level: Needs assistance Equipment used: Rolling walker (2 wheeled);1 person hand held assist Transfers: Sit to/from Stand Sit to Stand: Mod assist;+2 physical assistance         General transfer comment: Pt requires body weight assist with rising as well as tactile cue to sacral area to drive his hips underneath him to get into full standing. Increased time required for transfer.  Ambulation/Gait Ambulation/Gait assistance: Min guard Ambulation Distance (Feet): 10 Feet Assistive device: Rolling walker (2 wheeled);1 person hand held assist Gait Pattern/deviations: Decreased step length - right;Decreased step length - left;Step-to  pattern;Step-through pattern;Shuffle Gait velocity: reduced   General Gait Details: Pt needs cues to take larger steps as well as to look up and lift chest. Pt stated fatigue following 10 ft ambulation    Stairs            Wheelchair Mobility    Modified Rankin (Stroke Patients Only)       Balance Overall balance assessment: Needs assistance Sitting-balance support: Bilateral upper extremity supported;Feet supported Sitting balance-Leahy Scale: Good Sitting balance - Comments: controlled sitting immediately Postural control: Posterior lean Standing balance support: No upper extremity supported Standing balance-Leahy Scale: Fair Standing balance comment: Pt requires cues to lift chest and lean forward secondary to posterior lean.                     Cognition Arousal/Alertness: Awake/alert Behavior During Therapy: WFL for tasks assessed/performed (Very pleasant and attentive ) Overall Cognitive Status: History of cognitive impairments - at baseline                      Exercises Other Exercises Other Exercises: Pt performed strengthening LE exercises with supervision for proper technique. Exercises performed (x10 reps): ankle pumps, heel slides, quad sets, glute squeezes, hip abd/add, and knee-to-chest.     General Comments        Pertinent Vitals/Pain Pain Assessment: No/denies pain    Home Living                      Prior Function            PT Goals (current  goals can now be found in the care plan section) Acute Rehab PT Goals Patient Stated Goal: to walk PT Goal Formulation: With patient/family Time For Goal Achievement: 09/19/14 Potential to Achieve Goals: Good Progress towards PT goals: Progressing toward goals    Frequency  Min 2X/week    PT Plan Current plan remains appropriate    Co-evaluation             End of Session Equipment Utilized During Treatment: Gait belt Activity Tolerance: Patient tolerated  treatment well Patient left: in bed;with call bell/phone within reach;with bed alarm set;with family/visitor present     Time: 1914-7829 PT Time Calculation (min) (ACUTE ONLY): 24 min  Charges:                       G CodesBenna Dunks 2014-10-02, 3:35 PM Benna Dunks, SPT. [phone]

## 2014-09-07 NOTE — Progress Notes (Signed)
Patient cooperative with care, does not attempt to get out of bed.  No short term memory.  No complaints of pain.  Plan to discharge to [facility].  Worked with PT and took a couple of steps from bed and back to bed.  Assist X 2.

## 2014-09-07 NOTE — Progress Notes (Signed)
Plan is for patient to D/C to [facility] tomorrow 09/08/14. MD and RN aware of above. Clinical Social Worker (CSW) will continue to follow and assist as needed.   Jetta Lout, LCSWA [phone]

## 2014-09-08 LAB — BASIC METABOLIC PANEL
Anion gap: 6 (ref 5–15)
BUN: 26 mg/dL — ABNORMAL HIGH (ref 6–20)
CALCIUM: 9 mg/dL (ref 8.9–10.3)
CHLORIDE: 103 mmol/L (ref 101–111)
CO2: 30 mmol/L (ref 22–32)
Creatinine, Ser: 1.64 mg/dL — ABNORMAL HIGH (ref 0.61–1.24)
GFR calc Af Amer: 44 mL/min — ABNORMAL LOW (ref >60)
GFR, EST NON AFRICAN AMERICAN: 38 mL/min — AB (ref >60)
Glucose, Bld: 126 mg/dL — ABNORMAL HIGH (ref 65–99)
Potassium: 3.8 mmol/L (ref 3.5–5.1)
Sodium: 139 mmol/L (ref 135–145)

## 2014-09-08 LAB — GLUCOSE, CAPILLARY
GLUCOSE-CAPILLARY: 107 mg/dL — AB (ref 65–99)
Glucose-Capillary: 282 mg/dL — ABNORMAL HIGH (ref 65–99)

## 2014-09-08 LAB — HEMOGLOBIN: Hemoglobin: 10.1 g/dL — ABNORMAL LOW (ref 13.0–18.0)

## 2014-09-08 MED ORDER — METOPROLOL TARTRATE 25 MG PO TABS: 25.0000 mg | ORAL_TABLET | Freq: Two times a day (BID) | ORAL | Status: DC

## 2014-09-08 MED ORDER — EZETIMIBE 10 MG PO TABS: 10.0000 mg | ORAL_TABLET | Freq: Every day | ORAL | Status: DC

## 2014-09-08 NOTE — Evaluation (Signed)
Clinical/Bedside Swallow Evaluation Patient Details  Name: CAROLL CUNNINGTON MRN: [id number] Date of Birth: 1931/07/31  Today's Date: 09/08/2014 Time: SLP Start Time (ACUTE ONLY): 1135 SLP Stop Time (ACUTE ONLY): 1235 SLP Time Calculation (min) (ACUTE ONLY): 60 min  Past Medical History:  Past Medical History  Diagnosis Date  . Dementia   . Diabetes mellitus without complication   . Hypertension   . Arthritis   . Chronic kidney disease   . Peripheral vascular disease    Past Surgical History:  Past Surgical History  Procedure Laterality Date  . C-spine fusion N/A    HPI:  Pt is a 81 y.o. male with a known history of Dementia, diabetes mellitus type 2, hypertension, PVD, CK D3, spinal stenosis, DVT on Xarelto was brought in by the family with the complaints of progressive worsening of bilateral lower extremity weakness and pain with difficulty in ambulation ongoing for the past few days. Patient's family is not available at this time and the history I got is from the ED physician's note. Evaluation in the ED revealed stable labs, CT head negative for any acute intracranial pathology. Neurology on call Dr. Katrinka Blazing was consulted by the ED physician who recommended admission for further workup for possible myelitis. Pt does have some intermittent decreased awareness, "forgetfullness" his wife says, when he eats resulting in not chewing his food well b/f swallowing, per wife. Wife stated pt does need more verbal cues now vs. in the past.    Assessment / Plan / Recommendation Clinical Impression  Pt appeared to safely tolerate trials of thin liquids via cup and trials of solid foods w/ no immediate, overt s/s of aspiration noted. Pt consumed ~90% of his lunch meal feeding himself w/ no overt coughing occuring; oral phase appeared grossly wfl. Of note, pt exhibited min. decreased oral awareness often putting consecutive bites of food in his mouth one after another w/out fully clearing his mouth  first. Pt's wife stated he had been doing this more at home and coughing "sometimes" when eating his food. Discussion and education on general aspiration precautions including more monitoring of pt's self-feeding, smaller bite sizes of foods, and moistening foods more. Pt appeared to respond to verbal cues adequately enough as well. Discussed food options as well as strategy of using puree or ice cream in swallowing pills if any s/s of dysphagia noted when swallowing pills w/ liquids. Pt appears at reduced risk for aspiration at this time but will always be at a min. increased risk for aspiration to occur sec. to declined Cognitive status(Dementia). Rec. a mech soft diet(chopped meats moistened well) w/ general aspiration precautions and monitoring during meals as nec. Rec. ST f/u as indicated for further wife/pt education re: impact of Dementia on swallowing.     Aspiration Risk   (reducated at this time w/ general aspiration precautions)    Diet Recommendation Dysphagia 3 (Mech soft);Thin   Medication Administration: Whole meds with liquid (but w/ Puree if any dysphagia noted w/ liquids) Compensations: Slow rate;Small sips/bites (check for oral clearing; alternate foods/liquids during meal)    Other  Recommendations Oral Care Recommendations: Oral care BID;Oral care before and after PO;Staff/trained caregiver to provide oral care Other Recommendations:  (reduced distractions during meals; verbal cues as nec.)   Follow Up Recommendations       Frequency and Duration min 2x/week  1 week   Pertinent Vitals/Pain denied    SLP Swallow Goals  see care plan   Swallow Study Prior Functional Status  pt resided at home w/ wife; baseline Dementia w/ "forgetfullness"     General Date of Onset: 09/04/14 Other Pertinent Information: Pt is a 81 y.o. male with a known history of Dementia, diabetes mellitus type 2, hypertension, PVD, CK D3, spinal stenosis, DVT on Xarelto was brought in by the family  with the complaints of progressive worsening of bilateral lower extremity weakness and pain with difficulty in ambulation ongoing for the past few days. Patient's family is not available at this time and the history I got is from the ED physician's note. Evaluation in the ED revealed stable labs, CT head negative for any acute intracranial pathology. Neurology on call Dr. Katrinka Blazing was consulted by the ED physician who recommended admission for further workup for possible myelitis. Pt does have some intermittent decreased awareness, "forgetfullness" his wife says, when he eats resulting in not chewing his food well b/f swallowing, per wife. Wife stated pt does need more verbal cues now vs. in the past.  Type of Study: Bedside swallow evaluation Previous Swallow Assessment: pt has been seen by ST services for BSE during admission Diet Prior to this Study: Regular;Thin liquids Temperature Spikes Noted: No (wbc 4.5 at admission; min. elevated today; BP elevated) Respiratory Status: Room air (lingula scarring at Left lung base; R lung clear) History of Recent Intubation: No Behavior/Cognition: Alert;Cooperative;Pleasant mood;Confused;Requires cueing;Distractible Oral Cavity - Dentition: Adequate natural dentition/normal for age Self-Feeding Abilities: Able to feed self;Needs set up Patient Positioning: Upright in bed Baseline Vocal Quality: Normal Volitional Cough: Strong Volitional Swallow: Able to elicit    Oral/Motor/Sensory Function Overall Oral Motor/Sensory Function: Appears within functional limits for tasks assessed Labial ROM: Within Functional Limits Labial Symmetry: Within Functional Limits Labial Strength: Within Functional Limits Lingual ROM: Within Functional Limits Lingual Symmetry: Within Functional Limits Lingual Strength: Within Functional Limits Facial Symmetry: Within Functional Limits Mandible: Within Functional Limits   Ice Chips Ice chips: Not tested Other Comments: pt eating  his meal   Thin Liquid Thin Liquid: Within functional limits Presentation: Cup;Self Fed Other Comments: at end of meal when drinking the thin liquids(soda) from the can, pt exhibited mild throat clearing x2.     Nectar Thick Nectar Thick Liquid: Not tested   Honey Thick Honey Thick Liquid: Not tested   Puree Puree: Not tested   Solid   GO    Solid: Within functional limits Presentation: Self Fed;Spoon Other Comments: pt consumed all of the baked chicken, vegetables      Jerilynn Som, MS, CCC-SLP  Maksym Pfiffner 09/08/2014,2:09 PM

## 2014-09-08 NOTE — Progress Notes (Signed)
Patient is being discharged to [facility] today. Report called to [facility]; EMS called. Belongings packed & IV removed. VSS at discharge.

## 2014-09-08 NOTE — Clinical Social Work Note (Signed)
CSW notified pt, pt's wife, facility and RN that pt would DC to [facility] via EMS today.  CSW gave directions to pt's wife.  She has agreed to drive over to the facility if pt is not DC's by 2:pm today.  CSW signing off unless further needs arise.

## 2014-09-08 NOTE — Discharge Summary (Signed)
[facility] - [place] [facility]   PATIENT NAME: William Byrd    MR#:  [id number]  DATE OF BIRTH:  1931/10/27  DATE OF ADMISSION:  09/04/2014 ADMITTING PHYSICIAN: Crissie Figures, MD  DATE OF DISCHARGE: 09/08/2014  PRIMARY CARE PHYSICIAN: Marisue Ivan, MD    ADMISSION DIAGNOSIS:  Weakness of both lower extremities [R29.898]  DISCHARGE DIAGNOSIS:  Principal Problem:   Lower extremity weakness Active Problems:   DVT of lower extremity, bilateral   DM2 (diabetes mellitus, type 2)   HTN (hypertension)   Dementia   CKD (chronic kidney disease) stage 3, GFR 30-59 ml/min   HLD (hyperlipidemia)   Spinal stenosis   SECONDARY DIAGNOSIS:   Past Medical History  Diagnosis Date  . Dementia   . Diabetes mellitus without complication   . Hypertension   . Arthritis   . Chronic kidney disease   . Peripheral vascular disease     HOSPITAL COURSE:   1. Patient been having gradual weakness of the lower extremities. The morning of admission was unable to even get out of bed. He was found to have mild rhabdomyolysis likely statin induced. Zocor stopped. He was put on initial steroids for possible myopathy. Follow-up with Dr. Cristopher Peru neurology as outpatient. Patient will need physical therapy at rehabilitation. 2. Lower extremity DVT- on Xarelto. 3. Type 2 diabetes with hyperglycemia- sugars were very high when placed on the steroids and this is one of the reasons why I stopped the steroid. Continue Lantus and short acting insulin. 4. Acute renal failure on chronic kidney disease stage III patient was given IV fluid hydration and creatinine did improve down to 1.64. 5. Dementia without behavioral disturbance- continue usual medications. 6. Hyperlipidemia unspecified- Zocor stopped. Can continue Zetia. 7. Hypertension essential- blood pressure was elevated during the hospital course Norvasc had a be increased to 10 mg and metoprolol  started.  DISCHARGE CONDITIONS:   Satisfactory  CONSULTS OBTAINED:  Treatment Team:  Mellody Drown, MD  DRUG ALLERGIES:   Allergies  Allergen Reactions  . Fosinopril     Other reaction(s): Other (See Comments) Hyperkalemia     DISCHARGE MEDICATIONS:   Current Discharge Medication List    START taking these medications   Details  ezetimibe (ZETIA) 10 MG tablet Take 1 tablet (10 mg total) by mouth daily.    metoprolol tartrate (LOPRESSOR) 25 MG tablet Take 1 tablet (25 mg total) by mouth 2 (two) times daily.      CONTINUE these medications which have NOT CHANGED   Details  acetaminophen (TYLENOL) 325 MG tablet Take 1 tablet by mouth daily.    amLODipine (NORVASC) 10 MG tablet Take 10 mg by mouth daily.    Cholecalciferol (VITAMIN D-1000 MAX ST) 1000 UNITS tablet Take 1,000 Units by mouth daily.    docusate sodium (STOOL SOFTENER) 100 MG capsule Take 100 mg by mouth daily as needed. For constipation    donepezil (ARICEPT) 10 MG tablet Take 10 mg by mouth at bedtime.    DULoxetine (CYMBALTA) 60 MG capsule Take 60 mg by mouth daily.    finasteride (PROSCAR) 5 MG tablet Take 5 mg by mouth daily.    furosemide (LASIX) 20 MG tablet Take 20 mg by mouth daily.    HUMALOG KWIKPEN 100 UNIT/ML KiwkPen Inject 10-15 Units into the skin See admin instructions. Inject 10 units subcutaneous before breakfast and lunch. Inject 15 units subcutaneous before supper. Do not skip dose.    insulin glargine (LANTUS) 100 unit/mL SOPN  Inject 25 Units into the skin at bedtime.     ketorolac (ACULAR) 0.4 % SOLN Place 1 drop into both eyes 2 (two) times daily.    LYRICA 75 MG capsule Take 1 capsule by mouth 2 (two) times daily.    NAMENDA XR 28 MG CP24 24 hr capsule Take 28 mg by mouth daily.    prednisoLONE acetate (PRED FORTE) 1 % ophthalmic suspension Place 1 drop into the right eye 2 (two) times daily.    rivaroxaban (XARELTO) 20 MG TABS tablet Take 1 tablet by mouth daily.    Saw  Palmetto-Phytosterols (PROSTATE SR) 160-250 MG CAPS Take 1 capsule by mouth daily.    timolol (TIMOPTIC) 0.5 % ophthalmic solution Place 1 drop into both eyes 2 (two) times daily.      STOP taking these medications     TRADJENTA 5 MG TABS tablet      VYTORIN 10-40 MG per tablet          DISCHARGE INSTRUCTIONS:   Follow-up Dr. at rehabilitation in 1 day. Follow-up with Dr. Cristopher Peru as scheduled.  If you experience worsening of your admission symptoms, develop shortness of breath, life threatening emergency, suicidal or homicidal thoughts you must seek medical attention immediately by calling 911 or calling your MD immediately  if symptoms less severe.  You Must read complete instructions/literature along with all the possible adverse reactions/side effects for all the Medicines you take and that have been prescribed to you. Take any new Medicines after you have completely understood and accept all the possible adverse reactions/side effects.   Please note  You were cared for by a hospitalist during your hospital stay. If you have any questions about your discharge medications or the care you received while you were in the hospital after you are discharged, you can call the unit and asked to speak with the hospitalist on call if the hospitalist that took care of you is not available. Once you are discharged, your primary care physician will handle any further medical issues. Please note that NO REFILLS for any discharge medications will be authorized once you are discharged, as it is imperative that you return to your primary care physician (or establish a relationship with a primary care physician if you do not have one) for your aftercare needs so that they can reassess your need for medications and monitor your lab values.    Today   CHIEF COMPLAINT:   Chief Complaint  Patient presents with  . Leg Pain    HISTORY OF PRESENT ILLNESS:  William Byrd  is a 81 y.o. male with  a known history of DVT of the lower extremities and dementia presents with lower extremity weakness. As per wife this has been gradually going on but the morning of admission he could not even get himself out of bed. The patient needed a 3 night hospital stay in order to go to rehabilitation.   VITAL SIGNS:  Blood pressure 161/72, pulse 57, temperature 97.3 F (36.3 C), temperature source Oral, resp. rate 18, height 5\' 7"  (1.702 m), weight 73.891 kg (162 lb 14.4 oz), SpO2 100 %.    PHYSICAL EXAMINATION:  GENERAL:  81 y.o.-year-old patient lying in the bed with no acute distress.  EYES: Pupils equal, round, reactive to light and accommodation. No scleral icterus. Extraocular muscles intact.  HEENT: Head atraumatic, normocephalic. Oropharynx and nasopharynx clear.  NECK:  Supple, no jugular venous distention. No thyroid enlargement, no tenderness.  LUNGS: Normal breath sounds  bilaterally, no wheezing, rales,rhonchi or crepitation. No use of accessory muscles of respiration.  CARDIOVASCULAR: S1, S2 normal. 2/6 systolic murmur, no rubs, or gallops.  ABDOMEN: Soft, non-tender, non-distended. Bowel sounds present. No organomegaly or mass.  EXTREMITIES: No pedal edema, cyanosis, or clubbing.  NEUROLOGIC: Cranial nerves II through XII are intact. Muscle strength 5/5 in all extremities. Sensation intact.  patient able to straight leg raise without a problem. PSYCHIATRIC: The patient is alert.  patient not able to recognize me on a daily basis that I was seeing him.  SKIN: No obvious rash, lesion, or ulcer.   DATA REVIEW:   CBC  Recent Labs Lab 09/04/14 1105 09/08/14 0625  WBC 4.5  --   HGB 10.9* 10.1*  HCT 32.9*  --   PLT 243  --     Chemistries   Recent Labs Lab 09/04/14 1105  09/08/14 0625  NA 137  < > 139  K 4.1  < > 3.8  CL 103  < > 103  CO2 28  < > 30  GLUCOSE 111*  < > 126*  BUN 33*  < > 26*  CREATININE 2.17*  < > 1.64*  CALCIUM 9.0  < > 9.0  AST 34  --   --   ALT 23   --   --   ALKPHOS 66  --   --   BILITOT 0.5  --   --   < > = values in this interval not displayed.   RADIOLOGY:  Dg Chest Port 1 View  09/06/2014   CLINICAL DATA:  Abnormal breath sounds.  Diabetes, hypertension.  EXAM: PORTABLE CHEST - 1 VIEW  COMPARISON:  08/13/2014  FINDINGS: Scarring in the lingula at the left lung base. Right lung is clear. Heart is normal size, mediastinal contours within normal limits. No effusions. Calcifications in the left upper quadrant the abdomen compatible with granulomas within the spleen.  IMPRESSION: Lingular scarring.  No active disease.   Electronically Signed   By: Charlett Nose M.D.   On: 09/06/2014 15:42   Management plans discussed with the patient, family and they are in agreement.  CODE STATUS:     Code Status Orders        Start     Ordered   09/05/14 0410  Full code   Continuous     09/05/14 0409    Advance Directive Documentation        Most Recent Value   Type of Advance Directive  Healthcare Power of Attorney   Pre-existing out of facility DNR order (yellow form or pink MOST form)     "MOST" Form in Place?        TOTAL TIME TAKING CARE OF THIS PATIENT: 35 minutes, greater than 50% of the time in coordination of care in speaking with patient wife and social work team.   Renae Gloss, Duke Salvia.D on 09/08/2014 at 9:18 AM  Between 7am to 6pm - Pager - [phone]  After 6pm go to [email or web address] - password [email or web address] [email or web address]  [place] [facility]  Office  [phone]  CC: Primary care physician; Marisue Ivan, MD

## 2014-09-08 NOTE — Progress Notes (Signed)
Inpatient Diabetes Program Recommendations  AACE/ADA: New Consensus Statement on Inpatient Glycemic Control (2013)  Target Ranges:  Prepandial:   less than 140 mg/dL      Peak postprandial:   less than 180 mg/dL (1-2 hours)      Critically ill patients:  140 - 180 mg/dL   Results for William Byrd, William Byrd (MRN [id number]) as of 09/08/2014 09:11  Ref. Range 09/07/2014 07:31 09/07/2014 11:48 09/07/2014 16:33 09/07/2014 21:33 09/08/2014 07:49  Glucose-Capillary Latest Ref Range: 65-99 mg/dL 89 409 (H) 811 (H) 914 (H) 107 (H)    Diabetes history: DM2 Outpatient Diabetes medications: Lantus 20 units QHS, Humalog 10 units with breakfast, 10 units with lunch, 15 units with supper, Tradjenta 5 mg daily Current orders for Inpatient glycemic control: Levemir 20 units QHS, Novolog 0-15 units TID with meals, Tradjenta 5 mg daily  Inpatient Diabetes Program Recommendations Insulin - Meal Coverage: Noted that steroids and meal coverage were discontinued yesterday. Please consider re-ordering meal coverage perhaps at a lower dose; recommend ordering Novolog 4 units TID with meals.  Thanks, Orlando Penner, RN, MSN, CCRN, CDE Diabetes Coordinator Inpatient Diabetes Program [phone] (Team Pager from 8am to 5pm) [phone] ([facility] office) [phone] [facility] office) [phone] [facility] office)

## 2014-09-12 ENCOUNTER — Non-Acute Institutional Stay (SKILLED_NURSING_FACILITY): Payer: Medicare Other | Admitting: Internal Medicine

## 2014-09-12 DIAGNOSIS — E785 Hyperlipidemia, unspecified: Secondary | ICD-10-CM | POA: Diagnosis not present

## 2014-09-12 DIAGNOSIS — I10 Essential (primary) hypertension: Secondary | ICD-10-CM

## 2014-09-12 DIAGNOSIS — R29898 Other symptoms and signs involving the musculoskeletal system: Secondary | ICD-10-CM

## 2014-09-12 DIAGNOSIS — K5900 Constipation, unspecified: Secondary | ICD-10-CM

## 2014-09-12 DIAGNOSIS — F329 Major depressive disorder, single episode, unspecified: Secondary | ICD-10-CM

## 2014-09-12 DIAGNOSIS — F0393 Unspecified dementia, unspecified severity, with mood disturbance: Secondary | ICD-10-CM

## 2014-09-12 DIAGNOSIS — I82403 Acute embolism and thrombosis of unspecified deep veins of lower extremity, bilateral: Secondary | ICD-10-CM

## 2014-09-12 DIAGNOSIS — F0390 Unspecified dementia without behavioral disturbance: Secondary | ICD-10-CM

## 2014-09-12 DIAGNOSIS — F0280 Dementia in other diseases classified elsewhere without behavioral disturbance: Secondary | ICD-10-CM

## 2014-09-12 DIAGNOSIS — N183 Chronic kidney disease, stage 3 unspecified: Secondary | ICD-10-CM

## 2014-09-12 DIAGNOSIS — N400 Enlarged prostate without lower urinary tract symptoms: Secondary | ICD-10-CM | POA: Diagnosis not present

## 2014-09-12 DIAGNOSIS — M4800 Spinal stenosis, site unspecified: Secondary | ICD-10-CM

## 2014-09-12 NOTE — Progress Notes (Signed)
Patient ID: William Byrd, male   DOB: 1931/10/15, 81 y.o.   MRN: [id number]     Facility: [facility]    PCP: Marisue Ivan, MD  Code Status: full code  Allergies  Allergen Reactions  . Fosinopril     Other reaction(s): Other (See Comments) Hyperkalemia     Chief Complaint  Patient presents with  . New Admit To SNF     HPI:  81 y.o. patient is here for short term rehabilitation post hospital admission from 09/04/14-09/08/14 with bilateral lower extremity weakness. His statin was discontinued and he was started on steroids for possible myopathy. He was also diagnosed to have non occlusive bilateral lower extremity DVT and was started on xarelto. He had acute on chronic renal failure which responded well to iv fluids. He is pending outpatient neurology follow up. He is seen in his room today. He has PMH of dementia, DM, HTN, CKD, PVD and arthritis. He is alert and oriented only to person. He is in no distress. He does not remember being in the hospital. He has poor insight to his medical issues. He denies any concerns this visit.   Review of Systems:  Constitutional: Negative for fever, chills, diaphoresis.  HENT: Negative for headache, congestion, nasal discharge Eyes: Negative for eye pain, blurred vision Respiratory: Negative for cough, shortness of breath and wheezing.   Cardiovascular: Negative for chest pain, palpitations, leg swelling.  Gastrointestinal: Negative for heartburn, nausea, vomiting, abdominal pain Genitourinary: Negative for dysuria Neurological: positive  for weakness Psychiatric/Behavioral: positive for memory loss.    Past Medical History  Diagnosis Date  . Dementia   . Diabetes mellitus without complication   . Hypertension   . Arthritis   . Chronic kidney disease   . Peripheral vascular disease    Past Surgical History  Procedure Laterality Date  . C-spine fusion N/A    Social History:   reports that he has  quit smoking. He does not have any smokeless tobacco history on file. He reports that he does not drink alcohol or use illicit drugs.  Family History  Problem Relation Age of Onset  . Diabetes Mother   . Arthritis Sister     Medications:   Medication List       This list is accurate as of: 09/12/14  2:32 PM.  Always use your most recent med list.               acetaminophen 325 MG tablet  Commonly known as:  TYLENOL  Take 1 tablet by mouth daily.     amLODipine 10 MG tablet  Commonly known as:  NORVASC  Take 10 mg by mouth daily.     donepezil 10 MG tablet  Commonly known as:  ARICEPT  Take 10 mg by mouth at bedtime.     DULoxetine 60 MG capsule  Commonly known as:  CYMBALTA  Take 60 mg by mouth daily.     ezetimibe 10 MG tablet  Commonly known as:  ZETIA  Take 1 tablet (10 mg total) by mouth daily.     finasteride 5 MG tablet  Commonly known as:  PROSCAR  Take 5 mg by mouth daily.     furosemide 20 MG tablet  Commonly known as:  LASIX  Take 20 mg by mouth daily.     HUMALOG KWIKPEN 100 UNIT/ML KiwkPen  Generic drug:  insulin lispro  Inject 10-15 Units into the skin See admin instructions. Inject 10 units subcutaneous  before breakfast and lunch. Inject 15 units subcutaneous before supper. Do not skip dose.     insulin glargine 100 unit/mL Sopn  Commonly known as:  LANTUS  Inject 25 Units into the skin at bedtime.     ketorolac 0.4 % Soln  Commonly known as:  ACULAR  Place 1 drop into both eyes 2 (two) times daily.     LYRICA 75 MG capsule  Generic drug:  pregabalin  Take 1 capsule by mouth 2 (two) times daily.     metoprolol tartrate 25 MG tablet  Commonly known as:  LOPRESSOR  Take 1 tablet (25 mg total) by mouth 2 (two) times daily.     NAMENDA XR 28 MG Cp24 24 hr capsule  Generic drug:  memantine  Take 28 mg by mouth daily.     prednisoLONE acetate 1 % ophthalmic suspension  Commonly known as:  PRED FORTE  Place 1 drop into the right eye 2  (two) times daily.     PROSTATE SR 160-250 MG Caps  Take 1 capsule by mouth daily.     STOOL SOFTENER 100 MG capsule  Generic drug:  docusate sodium  Take 100 mg by mouth daily as needed. For constipation     timolol 0.5 % ophthalmic solution  Commonly known as:  TIMOPTIC  Place 1 drop into both eyes 2 (two) times daily.     VITAMIN D-1000 MAX ST 1000 UNITS tablet  Generic drug:  Cholecalciferol  Take 1,000 Units by mouth daily.     XARELTO 20 MG Tabs tablet  Generic drug:  rivaroxaban  Take 1 tablet by mouth daily.         Physical Exam: Filed Vitals:   09/12/14 1431  BP: 117/71  Pulse: 61  Temp: 97.1 F (36.2 C)  Resp: 18  SpO2: 95%    General- elderly male, well built, in no acute distress Head- normocephalic, atraumatic Throat- moist mucus membrane Eyes- PERRLA, EOMI, no pallor, no icterus, no discharge, normal conjunctiva, normal sclera Neck- no cervical lymphadenopathy Cardiovascular- normal s1,s2, no murmurs, palpable dorsalis pedis and radial pulses, trace leg edema Respiratory- bilateral clear to auscultation, no wheeze, no rhonchi, no crackles, no use of accessory muscles Abdomen- bowel sounds present, soft, non tender Musculoskeletal- able to move all 4 extremities, generalized weakness LE > UE Neurological- no focal deficit, alert and oriented to person only Skin- warm and dry Psychiatry- normal mood and affect    Labs reviewed: Basic Metabolic Panel:  Recent Labs  40/98/11 0456 09/06/14 0621 09/08/14 0625  NA 140 142 139  K 4.1 4.2 3.8  CL 105 107 103  CO2 29 30 30   GLUCOSE 231* 145* 126*  BUN 31* 18 26*  CREATININE 1.77* 1.57* 1.64*  CALCIUM 8.8* 8.8* 9.0   Liver Function Tests:  Recent Labs  09/24/13 0705 08/13/14 1142 09/04/14 1105  AST 24 33 34  ALT 31 23 23   ALKPHOS 71 76 66  BILITOT 0.6 0.8 0.5  PROT 7.6 7.7 7.4  ALBUMIN 3.0* 3.1* 3.1*   No results for input(s): LIPASE, AMYLASE in the last 8760 hours. No results for  input(s): AMMONIA in the last 8760 hours. CBC:  Recent Labs  02/22/14 1331 02/23/14 0526 02/24/14 0504 08/13/14 1142 09/04/14 1105 09/08/14 0625  WBC 14.0* 11.4* 10.2 8.8 4.5  --   NEUTROABS 12.3* 9.6* 8.6*  --   --   --   HGB 11.2* 9.4* 10.1* 10.5* 10.9* 10.1*  HCT 34.4* 28.3* 29.9* 32.0*  32.9*  --   MCV 92 92 91 89.8 88.9  --   PLT 202 163 177 229 243  --    Cardiac Enzymes:  Recent Labs  09/24/13 0705 08/13/14 1142 09/04/14 1105 09/06/14 0621  CKTOTAL  --   --  805* 259  TROPONINI 0.02 <0.03  --   --    BNP: Invalid input(s): POCBNP CBG:  Recent Labs  09/07/14 2133 09/08/14 0749 09/08/14 1121  GLUCAP 281* 107* 282*    Radiological Exams: Ct Head Wo Contrast  09/05/2014   CLINICAL DATA:  Bilateral lower extremity weakness and cramping for one day.  EXAM: CT HEAD WITHOUT CONTRAST  TECHNIQUE: Contiguous axial images were obtained from the base of the skull through the vertex without intravenous contrast.  COMPARISON:  Head CT 08/13/2014  FINDINGS: Generalized atrophy with ventriculomegaly and mild-moderate chronic small vessel ischemic change, stable from prior. No intracranial hemorrhage, mass effect, or midline shift. The basilar cisterns are patent. No evidence of territorial infarct. No intracranial fluid collection. Calvarium is intact. Included paranasal sinuses and mastoid air cells are well aerated.  IMPRESSION: Stable chronic change without acute intracranial abnormality.   Electronically Signed   By: Rubye Oaks M.D.   On: 09/05/2014 00:12   US Venous Img Lower Bilateral  09/04/2014   CLINICAL DATA:  Bilateral lower extremity pain and swelling. History of deep venous thrombosis.  EXAM: BILATERAL LOWER EXTREMITY VENOUS DOPPLER ULTRASOUND  TECHNIQUE: Gray-scale sonography with graded compression, as well as color Doppler and duplex ultrasound were performed to evaluate the lower extremity deep venous systems from the level of the common femoral vein and  including the common femoral, femoral, profunda femoral, popliteal and calf veins including the posterior tibial, peroneal and gastrocnemius veins when visible. The superficial great saphenous vein was also interrogated. Spectral Doppler was utilized to evaluate flow at rest and with distal augmentation maneuvers in the common femoral, femoral and popliteal veins.  COMPARISON:  Venous lower extremity ultrasound 06/17/2013  FINDINGS: RIGHT LOWER EXTREMITY  Common Femoral Vein: No evidence of thrombus. Normal compressibility, respiratory phasicity and response to augmentation.  Saphenofemoral Junction: No evidence of thrombus. Normal compressibility and flow on color Doppler imaging.  Profunda Femoral Vein: No evidence of thrombus. Normal compressibility and flow on color Doppler imaging.  Femoral Vein: There is linear echogenic material within the lumen of the vessel. The vessel is compressible. There is flow within lumen.  Popliteal Vein: There is linear echogenic true of the lumen of the vessel. The vessel is compressible. There is flow within lumen.  Calf Veins: No evidence of thrombus. Normal compressibility and flow on color Doppler imaging.  Superficial Great Saphenous Vein: No evidence of thrombus. Normal compressibility and flow on color Doppler imaging.  LEFT LOWER EXTREMITY  Common Femoral Vein: No evidence of thrombus. Normal compressibility, respiratory phasicity and response to augmentation.  Saphenofemoral Junction: No evidence of thrombus. Normal compressibility and flow on color Doppler imaging.  Profunda Femoral Vein: No evidence of thrombus. Normal compressibility and flow on color Doppler imaging.  Femoral Vein: Linear echogenic material within the lumen of the vessel. Vessels is compressible. There is flow within the vessel.  Popliteal Vein: No evidence of thrombus. Normal compressibility, respiratory phasicity and response to augmentation.  Calf Veins: No evidence of thrombus. Normal  compressibility and flow on color Doppler imaging.  Superficial Great Saphenous Vein: No evidence of thrombus. Normal compressibility and flow on color Doppler imaging.  IMPRESSION: 1. Nonocclusive deep venous thrombosis within the  right femoral vein and popliteal vein. 2. Nonocclusive deep venous thrombosis within the left femoral vein. These results will be called to the ordering clinician or representative by the Radiologist Assistant, and communication documented in the PACS or zVision Dashboard.   Electronically Signed   By: Genevive Bi M.D.   On: 09/04/2014 16:19     Assessment/Plan  Lower extremity weakness Unknown etiology. Will have him work with physical therapy and occupational therapy team to help with gait training and muscle strengthening exercises.fall precautions. Skin care. Encourage to be out of bed. Neurology follow up pending. Continue vitamin d supplement and tylenol for pain  Bilateral lower extremity dvt Stable, continue xarelto for now  HTN Stable bp reading, continue lopressor 25 mg bid and norvasc 10 mg daily, monitor bp  HLD Off statin, continue zetia for now  Constipation Stable, continue colace 100 mg daily as needed  Dementia Without behavioral disturbance, assistance with ADLS, pressure ulcer prophylaxis, fall precautions. Continue aricpet 10 mg daily and namenda xr 28 mg daily  Depression with dementia Continue cymbalta 60 mg daily  BPH Continue proscar 5 mg daily with prostate SR home regimen  Spinal stenosis  Continue tylenol with lyrica 75 mg bid for pain   Dm with renal manifestation Lab Results  Component Value Date   HGBA1C 9.5* 09/05/2014  appears to be poorly controlled. on humalog 10 u with breakfast and lunch and 15 u with dinner, monitor cbg and adjust dosing if needed  ckd Monitor renal function  Anemia of chronic disease Monitor h&h    Goals of care: short term rehabilitation   Labs/tests ordered: cbc, cmp  Family/  staff Communication: reviewed care plan with patient and nursing supervisor    Oneal Grout, MD  [facility] [phone] (Monday-Friday 8 am - 5 pm) [phone] (afterhours)

## 2014-09-19 LAB — BASIC METABOLIC PANEL
BUN: 27 mg/dL — AB (ref 4–21)
Creatinine: 1.7 mg/dL — AB (ref 0.6–1.3)
GLUCOSE: 121 mg/dL
Potassium: 4 mmol/L (ref 3.4–5.3)
SODIUM: 139 mmol/L (ref 137–147)

## 2014-09-19 LAB — CBC AND DIFFERENTIAL
HCT: 32 % — AB (ref 41–53)
HEMOGLOBIN: 10.4 g/dL — AB (ref 13.5–17.5)
Platelets: 245 10*3/uL (ref 150–399)
WBC: 3.8 10*3/mL

## 2014-09-19 LAB — HEPATIC FUNCTION PANEL
ALT: 19 U/L (ref 10–40)
AST: 19 U/L (ref 14–40)
Alkaline Phosphatase: 60 U/L (ref 25–125)
BILIRUBIN, TOTAL: 0.3 mg/dL

## 2014-09-25 ENCOUNTER — Non-Acute Institutional Stay (SKILLED_NURSING_FACILITY): Payer: Medicare Other | Admitting: Internal Medicine

## 2014-09-25 ENCOUNTER — Encounter: Payer: Self-pay | Admitting: Internal Medicine

## 2014-09-25 DIAGNOSIS — N183 Chronic kidney disease, stage 3 unspecified: Secondary | ICD-10-CM

## 2014-09-25 DIAGNOSIS — I82403 Acute embolism and thrombosis of unspecified deep veins of lower extremity, bilateral: Secondary | ICD-10-CM

## 2014-09-25 DIAGNOSIS — R600 Localized edema: Secondary | ICD-10-CM | POA: Diagnosis not present

## 2014-09-25 NOTE — Progress Notes (Signed)
Patient ID: William Byrd, male   DOB: 28-Aug-1931, 81 y.o.   MRN: [id number]   [facility]  Code Status: Full Code   Chief Complaint  Patient presents with  . Acute Visit    Increased leg swelling     Allergies  Allergen Reactions  . Fosinopril     Other reaction(s): Other (See Comments) Hyperkalemia     HPI:  81 y.o. patient is seen for concern of increased lower extremity swelling. This is limiting his working with therapy. He has some cough but denies dyspnea. Has easy fatigue present. He is here for short term rehabilitation post hospital admission from 09/04/14-09/08/14 with bilateral lower extremity weakness thought to be from myopathy. He was also diagnosed to have non occlusive bilateral lower extremity DVT and was started on xarelto. His wife is present at bedside  Review of Systems:  Constitutional: Negative for fever, chills, diaphoresis.  HENT: Negative for headache, congestion, nasal discharge Eyes: Negative for eye pain, blurred vision Respiratory: Negative for shortness of breath and wheezing.   Cardiovascular: Negative for chest pain, palpitations, leg swelling.  Gastrointestinal: Negative for heartburn, nausea, vomiting, abdominal pain Genitourinary: Negative for dysuria Neurological: positive  for weakness Psychiatric/Behavioral: positive for memory loss.   Past Medical History  Diagnosis Date  . Dementia   . Diabetes mellitus without complication   . Hypertension   . Arthritis   . Chronic kidney disease   . Peripheral vascular disease        Medication List       This list is accurate as of: 09/25/14  3:03 PM.  Always use your most recent med list.               acetaminophen 325 MG tablet  Commonly known as:  TYLENOL  Take 1 tablet by mouth daily.     amLODipine 10 MG tablet  Commonly known as:  NORVASC  Take 10 mg by mouth daily.     donepezil 10 MG tablet  Commonly known as:  ARICEPT  Take 10 mg by mouth at bedtime.      DULoxetine 60 MG capsule  Commonly known as:  CYMBALTA  Take 60 mg by mouth daily.     ezetimibe 10 MG tablet  Commonly known as:  ZETIA  Take 1 tablet (10 mg total) by mouth daily.     finasteride 5 MG tablet  Commonly known as:  PROSCAR  Take 5 mg by mouth daily.     furosemide 20 MG tablet  Commonly known as:  LASIX  Take 20 mg by mouth daily.     HUMALOG KWIKPEN 100 UNIT/ML KiwkPen  Generic drug:  insulin lispro  Inject 10 units subcutaneous with breakfast and dinner. Inject 5 units subcutaneous before lunch. Do not skip dose. HOLD if cbs <100 and notify provider if cbg > 350     LYRICA 75 MG capsule  Generic drug:  pregabalin  Take 1 capsule by mouth 2 (two) times daily.     metoprolol tartrate 25 MG tablet  Commonly known as:  LOPRESSOR  Take 1 tablet (25 mg total) by mouth 2 (two) times daily.     NAMENDA XR 28 MG Cp24 24 hr capsule  Generic drug:  memantine  Take 28 mg by mouth daily.     prednisoLONE acetate 1 % ophthalmic suspension  Commonly known as:  PRED FORTE  Place 1 drop into the right eye 2 (two) times daily.  PROSTATE SR 160-250 MG Caps  Take 1 capsule by mouth daily.     STOOL SOFTENER 100 MG capsule  Generic drug:  docusate sodium  Take 100 mg by mouth daily as needed. For constipation     timolol 0.5 % ophthalmic solution  Commonly known as:  TIMOPTIC  Place 1 drop into both eyes 2 (two) times daily.     VITAMIN D-1000 MAX ST 1000 UNITS tablet  Generic drug:  Cholecalciferol  Take 1,000 Units by mouth daily.     XARELTO 20 MG Tabs tablet  Generic drug:  rivaroxaban  Take 1 tablet by mouth daily.        Physical exam BP 148/67 mmHg  Pulse 64  Temp(Src) 98.4 F (36.9 C) (Oral)  Resp 20  SpO2 97%   General- elderly male, well built, in no acute distress Head- normocephalic, atraumatic Throat- moist mucus membrane Eyes- PERRLA, EOMI, no pallor, no icterus, no discharge, normal conjunctiva, normal sclera Neck- no  cervical lymphadenopathy, no JVD Cardiovascular- normal s1,s2, no murmurs, palpable dorsalis pedis Respiratory- bilateral clear to auscultation, scatterd rhonchi present which clears with cough, no wheeze, no crackles, no use of accessory muscles Abdomen- bowel sounds present, soft, non tender Musculoskeletal- able to move all 4 extremities, generalized weakness LE > UE, 2+ edema in leg with extension to thigh with 1+ pitting edema, good circulation and warm to touch Neurological- no focal deficit, alert and oriented to person only Skin- warm and dry  Labs CMP Latest Ref Rng 09/19/2014 09/08/2014 09/06/2014  Glucose 65 - 99 mg/dL - 295(A) 213(Y)  BUN 4 - 21 mg/dL 86(V) 78(I) 18  Creatinine 0.6 - 1.3 mg/dL 6.9(G) 2.95(M) 8.41(L)  Sodium 137 - 147 mmol/L 139 139 142  Potassium 3.4 - 5.3 mmol/L 4.0 3.8 4.2  Chloride 101 - 111 mmol/L - 103 107  CO2 22 - 32 mmol/L - 30 30  Calcium 8.9 - 10.3 mg/dL - 9.0 2.4(M)  Total Protein 6.5 - 8.1 g/dL - - -  Total Bilirubin 0.3 - 1.2 mg/dL - - -  Alkaline Phos 25 - 125 U/L 60 - -  AST 14 - 40 U/L 19 - -  ALT 10 - 40 U/L 19 - -     Assessment/plan  Leg edema Increased. Has recent dvt in both legs. With impaired renal function, d/c lasix. Start torsemide 20 mg daily, to keep legs elevated at rest. Check bmp 10/02/14. Add daily weight check. Avoid ted hose with recent diagnosis of bilateral dvt  Bilateral lower extremity DVT Continue xarelto for now  ckd stage 3 D/c lasix and start torsemide, monitor renal function  Oneal Grout, MD  [facility] [phone] (Monday-Friday 8 am - 5 pm) [phone] (afterhours)

## 2014-10-02 ENCOUNTER — Non-Acute Institutional Stay (SKILLED_NURSING_FACILITY): Payer: Medicare Other | Admitting: Nurse Practitioner

## 2014-10-02 DIAGNOSIS — R609 Edema, unspecified: Secondary | ICD-10-CM | POA: Diagnosis not present

## 2014-10-02 DIAGNOSIS — R05 Cough: Secondary | ICD-10-CM

## 2014-10-02 DIAGNOSIS — F0390 Unspecified dementia without behavioral disturbance: Secondary | ICD-10-CM | POA: Diagnosis not present

## 2014-10-02 DIAGNOSIS — E1122 Type 2 diabetes mellitus with diabetic chronic kidney disease: Secondary | ICD-10-CM | POA: Diagnosis not present

## 2014-10-02 DIAGNOSIS — I82403 Acute embolism and thrombosis of unspecified deep veins of lower extremity, bilateral: Secondary | ICD-10-CM | POA: Diagnosis not present

## 2014-10-02 DIAGNOSIS — I10 Essential (primary) hypertension: Secondary | ICD-10-CM

## 2014-10-02 DIAGNOSIS — R059 Cough, unspecified: Secondary | ICD-10-CM

## 2014-10-02 DIAGNOSIS — E785 Hyperlipidemia, unspecified: Secondary | ICD-10-CM

## 2014-10-02 DIAGNOSIS — N189 Chronic kidney disease, unspecified: Secondary | ICD-10-CM

## 2014-10-02 DIAGNOSIS — R29898 Other symptoms and signs involving the musculoskeletal system: Secondary | ICD-10-CM

## 2014-10-02 DIAGNOSIS — N183 Chronic kidney disease, stage 3 unspecified: Secondary | ICD-10-CM

## 2014-10-02 NOTE — Progress Notes (Signed)
Patient ID: William Byrd, male   DOB: 10-19-1931, 81 y.o.   MRN: [id number]    Nursing Home Location:  [facility]   Place of Service: SNF (31)  PCP: Marisue Ivan, MD  Allergies  Allergen Reactions  . Fosinopril     Other reaction(s): Other (See Comments) Hyperkalemia     Chief Complaint  Patient presents with  . Discharge Note    HPI:  Patient is a 81 y.o. male seen today at [facility] for discharge. He has PMH of dementia, DM, HTN, CKD, PVD arthritis, and memory loss. Pt is at [facility] place for short term rehabilitation post hospital admission from 09/04/14-09/08/14 with bilateral lower extremity weakness. His statin was discontinued and he was started on steroids for possible myopathy. Pt found to have non occlusive bilateral lower extremity DVT and was started on xarelto. Pt seen due to worsening LE edema last week and diuretic adjusted. pts swelling has improved. Not very compliant with elevated LE.  Pt noted with cough. No fevers, chills, no shortness of breath or chest discomfort. Nonproductive. Feels well otherwise.  Pt will be returning home with wife. Patient currently doing well with therapy, now stable to discharge home with home health.    Review of Systems:  Review of Systems  Constitutional: Negative for activity change, appetite change, fatigue and unexpected weight change.  HENT: Negative for congestion and hearing loss.   Eyes: Negative.   Respiratory: Positive for cough. Negative for shortness of breath and wheezing.   Cardiovascular: Negative for chest pain and palpitations.  Gastrointestinal: Negative for abdominal pain, diarrhea and constipation.  Genitourinary: Negative for dysuria and difficulty urinating.  Musculoskeletal: Negative for myalgias and arthralgias.  Skin: Negative for color change and wound.  Neurological: Negative for dizziness and weakness.  Psychiatric/Behavioral: Positive for confusion. Negative  for behavioral problems and agitation.       Memory loss    Past Medical History  Diagnosis Date  . Dementia   . Diabetes mellitus without complication   . Hypertension   . Arthritis   . Chronic kidney disease   . Peripheral vascular disease    Past Surgical History  Procedure Laterality Date  . C-spine fusion N/A    Social History:   reports that he has quit smoking. He does not have any smokeless tobacco history on file. He reports that he does not drink alcohol or use illicit drugs.  Family History  Problem Relation Age of Onset  . Diabetes Mother   . Arthritis Sister     Medications: Patient's Medications  New Prescriptions   No medications on file  Previous Medications   ACETAMINOPHEN (TYLENOL) 325 MG TABLET    Take 1 tablet by mouth daily.   AMLODIPINE (NORVASC) 10 MG TABLET    Take 10 mg by mouth daily.   CHOLECALCIFEROL (VITAMIN D-1000 MAX ST) 1000 UNITS TABLET    Take 1,000 Units by mouth daily.   DOCUSATE SODIUM (STOOL SOFTENER) 100 MG CAPSULE    Take 100 mg by mouth daily as needed. For constipation   DONEPEZIL (ARICEPT) 10 MG TABLET    Take 10 mg by mouth at bedtime.   DULOXETINE (CYMBALTA) 60 MG CAPSULE    Take 60 mg by mouth daily.   EZETIMIBE (ZETIA) 10 MG TABLET    Take 1 tablet (10 mg total) by mouth daily.   FINASTERIDE (PROSCAR) 5 MG TABLET    Take 5 mg by mouth daily.  HUMALOG KWIKPEN 100 UNIT/ML KIWKPEN    Inject 10 units subcutaneous with breakfast and dinner. Inject 5 units subcutaneous before lunch. Do not skip dose. HOLD if cbs <100 and notify provider if cbg > 350   LYRICA 75 MG CAPSULE    Take 1 capsule by mouth 2 (two) times daily.   METOPROLOL TARTRATE (LOPRESSOR) 25 MG TABLET    Take 1 tablet (25 mg total) by mouth 2 (two) times daily.   NAMENDA XR 28 MG CP24 24 HR CAPSULE    Take 28 mg by mouth daily.   PREDNISOLONE ACETATE (PRED FORTE) 1 % OPHTHALMIC SUSPENSION    Place 1 drop into the right eye 2 (two) times daily.   RIVAROXABAN (XARELTO)  20 MG TABS TABLET    Take 1 tablet by mouth daily.   SAW PALMETTO-PHYTOSTEROLS (PROSTATE SR) 160-250 MG CAPS    Take 1 capsule by mouth daily.   TIMOLOL (TIMOPTIC) 0.5 % OPHTHALMIC SOLUTION    Place 1 drop into both eyes 2 (two) times daily.   TORSEMIDE (DEMADEX) 20 MG TABLET    Take 20 mg by mouth daily.  Modified Medications   No medications on file  Discontinued Medications   FUROSEMIDE (LASIX) 20 MG TABLET    Take 20 mg by mouth daily.     Physical Exam: Filed Vitals:   10/02/14 1406  BP: 134/73  Pulse: 66  Temp: 96.7 F (35.9 C)  Resp: 20    Physical Exam  Constitutional: He appears well-developed and well-nourished. No distress.  HENT:  Head: Normocephalic and atraumatic.  Mouth/Throat: Oropharynx is clear and moist. No oropharyngeal exudate.  Eyes: Conjunctivae and EOM are normal. Pupils are equal, round, and reactive to light.  Neck: Normal range of motion. Neck supple.  Cardiovascular: Normal rate, regular rhythm and normal heart sounds.   Pulmonary/Chest: Effort normal and breath sounds normal.  Abdominal: Soft. Bowel sounds are normal.  Musculoskeletal: He exhibits no tenderness. Edema: +1 bilaterally.  Neurological: He is alert.  Oriented to self only  Skin: Skin is warm and dry. He is not diaphoretic.  Psychiatric: He has a normal mood and affect.    Labs reviewed: Basic Metabolic Panel:  Recent Labs  16/10/96 0456 09/06/14 0621 09/08/14 0625 09/19/14  NA 140 142 139 139  K 4.1 4.2 3.8 4.0  CL 105 107 103  --   CO2 --   GLUCOSE 231* 145* 126*  --   BUN 31* 18 26* 27*  CREATININE 1.77* 1.57* 1.64* 1.7*  CALCIUM 8.8* 8.8* 9.0  --    Liver Function Tests:  Recent Labs  08/13/14 1142 09/04/14 1105 09/19/14  AST 33 34 19  ALT ALKPHOS 76 66 60  BILITOT 0.8 0.5  --   PROT 7.7 7.4  --   ALBUMIN 3.1* 3.1*  --    No results for input(s): LIPASE, AMYLASE in the last 8760 hours. No results for input(s): AMMONIA in the last 8760  hours. CBC:  Recent Labs  02/22/14 1331 02/23/14 0526 02/24/14 0504  08/13/14 1142 09/04/14 1105 09/08/14 0625 09/19/14  WBC 14.0* 11.4* 10.2  --  8.8 4.5  --  3.8  NEUTROABS 12.3* 9.6* 8.6*  --   --   --   --   --   HGB 11.2* 9.4* 10.1*  < > 10.5* 10.9* 10.1* 10.4*  HCT 34.4* 28.3* 29.9*  --  32.0* 32.9*  --  32*  MCV 92 92  91  --  89.8 88.9  --   --   PLT 202 163 177  --  229 243  --  245  < > = values in this interval not displayed. TSH:  Recent Labs  09/05/14 0456  TSH 4.513*   A1C: Lab Results  Component Value Date   HGBA1C 9.5* 09/05/2014   Lipid Panel: No results for input(s): CHOL, HDL, LDLCALC, TRIG, CHOLHDL, LDLDIRECT in the last 8760 hours. Result Date: 10/02/14 11:38 AM      Analyte   Result Value   Ref. Range    Units   Out of Range   Lab  Sodium  142  135-146  mmol/L    SLN  Potassium  4.1  3.5-5.3  mmol/L      Chloride  106  98-110  mmol/L      CO2  29  20-31  mmol/L      Glucose  104  65-99  mg/dL  H    BUN  24  1-61  mg/dL      Creatinine  0.96  0.70-1.11  mg/dL  H    Calcium  8.4  Radiological Exams: Ct Head Wo Contrast  09/05/2014   CLINICAL DATA:  Bilateral lower extremity weakness and cramping for one day.  EXAM: CT HEAD WITHOUT CONTRAST  TECHNIQUE: Contiguous axial images were obtained from the base of the skull through the vertex without intravenous contrast.  COMPARISON:  Head CT 08/13/2014  FINDINGS: Generalized atrophy with ventriculomegaly and mild-moderate chronic small vessel ischemic change, stable from prior. No intracranial hemorrhage, mass effect, or midline shift. The basilar cisterns are patent. No evidence of territorial infarct. No intracranial fluid collection. Calvarium is intact. Included paranasal sinuses and mastoid air cells are well aerated.  IMPRESSION: Stable chronic change without acute intracranial abnormality.   Electronically Signed   By: Rubye Oaks M.D.   On: 09/05/2014 00:12   US Venous Img Lower  Bilateral  09/04/2014   CLINICAL DATA:  Bilateral lower extremity pain and swelling. History of deep venous thrombosis.  EXAM: BILATERAL LOWER EXTREMITY VENOUS DOPPLER ULTRASOUND  TECHNIQUE: Gray-scale sonography with graded compression, as well as color Doppler and duplex ultrasound were performed to evaluate the lower extremity deep venous systems from the level of the common femoral vein and including the common femoral, femoral, profunda femoral, popliteal and calf veins including the posterior tibial, peroneal and gastrocnemius veins when visible. The superficial great saphenous vein was also interrogated. Spectral Doppler was utilized to evaluate flow at rest and with distal augmentation maneuvers in the common femoral, femoral and popliteal veins.  COMPARISON:  Venous lower extremity ultrasound 06/17/2013  FINDINGS: RIGHT LOWER EXTREMITY  Common Femoral Vein: No evidence of thrombus. Normal compressibility, respiratory phasicity and response to augmentation.  Saphenofemoral Junction: No evidence of thrombus. Normal compressibility and flow on color Doppler imaging.  Profunda Femoral Vein: No evidence of thrombus. Normal compressibility and flow on color Doppler imaging.  Femoral Vein: There is linear echogenic material within the lumen of the vessel. The vessel is compressible. There is flow within lumen.  Popliteal Vein: There is linear echogenic true of the lumen of the vessel. The vessel is compressible. There is flow within lumen.  Calf Veins: No evidence of thrombus. Normal compressibility and flow on color Doppler imaging.  Superficial Great Saphenous Vein: No evidence of thrombus. Normal compressibility and flow on color Doppler imaging.  LEFT LOWER EXTREMITY  Common Femoral Vein: No evidence of thrombus. Normal compressibility, respiratory  phasicity and response to augmentation.  Saphenofemoral Junction: No evidence of thrombus. Normal compressibility and flow on color Doppler imaging.  Profunda  Femoral Vein: No evidence of thrombus. Normal compressibility and flow on color Doppler imaging.  Femoral Vein: Linear echogenic material within the lumen of the vessel. Vessels is compressible. There is flow within the vessel.  Popliteal Vein: No evidence of thrombus. Normal compressibility, respiratory phasicity and response to augmentation.  Calf Veins: No evidence of thrombus. Normal compressibility and flow on color Doppler imaging.  Superficial Great Saphenous Vein: No evidence of thrombus. Normal compressibility and flow on color Doppler imaging.  IMPRESSION: 1. Nonocclusive deep venous thrombosis within the right femoral vein and popliteal vein. 2. Nonocclusive deep venous thrombosis within the left femoral vein. These results will be called to the ordering clinician or representative by the Radiologist Assistant, and communication documented in the PACS or zVision Dashboard.   Electronically Signed   By: Genevive Bi M.D.   On: 09/04/2014 16:19   Assessment/Plan 1. DVT of lower extremity, bilateral Stable, conts on xarelto 20 mg daily  2. Essential hypertension Stable, conts lopressor and norvasc  3. Weakness of both lower extremities -unknown etiology but has improved with therapy. Neurology follow up pending.   4. Dementia, without behavioral disturbance Advanced, needing assistance with ADLs, to discharge home with wife. conts on aricept and namenda  5. HLD (hyperlipidemia) Off statin due to LE weakness, conts on zetia  6. CKD (chronic kidney disease) stage 3, GFR 30-59 ml/min Remains stable, will need ongoing follow up with PCP  7. Type 2 diabetes mellitus with diabetic chronic kidney disease -poorly controlled prior to hospitalization, blood sugars with good control on current lantus 25 units qhs and humalog 10 units with breakfast and supper and 5 units with lunch, will cont at this time.   8. Cough Noted some cough and congestion, no shortness of breath fever or chills.   -will start mucinex DM bid for now and have nursing monitor and to notify if symptoms worsen.  9. LE edema -lasix changed to torsemide 20 mg daily, LE edema has improved, will cont current medication and encouraged leg elevation as tolerates  pt is stable for discharge-will need PT/OT/Nursing per home health. No DME needed. Rx written.  will need to follow up with PCP within 2 weeks.      Janene Harvey. Biagio Borg  [facility] [phone] 8 am - 5 pm) [phone] (after hours)

## 2014-10-03 ENCOUNTER — Non-Acute Institutional Stay (SKILLED_NURSING_FACILITY): Payer: Medicare Other | Admitting: Internal Medicine

## 2014-10-03 DIAGNOSIS — N183 Chronic kidney disease, stage 3 (moderate): Secondary | ICD-10-CM | POA: Diagnosis not present

## 2014-10-03 DIAGNOSIS — R600 Localized edema: Secondary | ICD-10-CM | POA: Diagnosis not present

## 2014-10-03 NOTE — Progress Notes (Signed)
Patient ID: RISHARD DELANGE, male   DOB: Aug 19, 1931, 81 y.o.   MRN: [id number]    Facility: [facility]   Chief Complaint  Patient presents with  . Acute Visit    leg swelling and worsening kidney function   Allergies  Allergen Reactions  . Fosinopril     Other reaction(s): Other (See Comments) Hyperkalemia    HPI:  80 y.o. patient is seen for acute concern per therapy team. They are concerned about increased leg swelling. Patient was recently seen and started on torsemide 20 mg daily and his lasix was discontinued. He is seen in his room today. He mentions that his leg swelling has improved. On lab review renal function slightly worsened from before. Also on review of his weight, has lost 3 lb. Denies any leg pain or redness. He is here for short term rehabilitation post hospital admission from 09/04/14-09/08/14 with bilateral lower extremity weakness, non occlusive bilateral lower extremity DVT and is on xarelto. He has PMH of dementia, DM, HTN, CKD, PVD and arthritis. Wife is present during the visit.  Review of Systems:  Constitutional: Negative for fever, chills, diaphoresis.  Respiratory: Negative for shortness of breath and wheezing.  positive for cough Cardiovascular: Negative for chest pain, palpitations Gastrointestinal: Negative for heartburn, nausea, vomiting, abdominal pain Genitourinary: Negative for dysuria Neurological: negative for numbness and tingling in legs Psychiatric/Behavioral: positive for memory loss.   Past Medical History  Diagnosis Date  . Dementia   . Diabetes mellitus without complication   . Hypertension   . Arthritis   . Chronic kidney disease   . Peripheral vascular disease    Medication reviewed. See [facility]  Physical exam BP 158/81 mmHg  Pulse 67  Temp(Src) 97.7 F (36.5 C)  Resp 18  SpO2 98%  Weight 8/17 177.7 lb, 8/18 175.9 lb, 8/21 175 lb  General- elderly male, well built, in no acute distress Head-  normocephalic, atraumatic Throat- moist mucus membrane Neck- no cervical lymphadenopathy, no JVD Cardiovascular- normal s1,s2, no murmurs, palpable dorsalis pedis and radial pulses, 1+ leg edema Respiratory- bilateral clear to auscultation, no wheeze, no rhonchi, no crackles, no use of accessory muscles Abdomen- bowel sounds present, soft, non tender Musculoskeletal- able to move all 4 extremities, generalized weakness LE > UE Neurological- no focal deficit, alert and oriented to person only Skin- warm and dry, no erythema on legs, normal temperature Psychiatry- normal mood and affect   Labs- 10/02/14 na 142, k 4.1, bun 24, cr 1.84, ca 8.4  CMP Latest Ref Rng 09/19/2014 09/08/2014 09/06/2014  Glucose 65 - 99 mg/dL - 621(H) 086(V)  BUN 4 - 21 mg/dL 78(I) 69(G) 18  Creatinine 0.6 - 1.3 mg/dL 2.9(B) 2.84(X) 3.24(M)  Sodium 137 - 147 mmol/L 139 139 142  Potassium 3.4 - 5.3 mmol/L 4.0 3.8 4.2  Chloride 101 - 111 mmol/L - 103 107  CO2 22 - 32 mmol/L - 30 30  Calcium 8.9 - 10.3 mg/dL - 9.0 0.1(U)  Total Protein 6.5 - 8.1 g/dL - - -  Total Bilirubin 0.3 - 1.2 mg/dL - - -  Alkaline Phos 25 - 125 U/L 60 - -  AST 14 - 40 U/L 19 - -  ALT 10 - 40 U/L 19 - -    Assessment/plan  Leg edema From his DVT and venous stasis. Continue xarelto, torsemide 20 mg daily. Improved swelling from last visit. No signs of fluid overload on exam. Has lost 3 lbs. Keep legs elevated at rest  and monitor for now  ckd stage 3 Reviewed recent lab, torsemide has been helping with his edema and will be safer than furosemide on his kidneys. Continue current regimen. Monitor clinically for now  Reviewed care plan with patient and his wife.   Oneal Grout, MD  [facility] [phone] (Monday-Friday 8 am - 5 pm) [phone] (afterhours)

## 2014-12-04 ENCOUNTER — Other Ambulatory Visit: Payer: Self-pay | Admitting: Nurse Practitioner

## 2014-12-28 ENCOUNTER — Emergency Department: Payer: Medicare Other

## 2014-12-28 ENCOUNTER — Inpatient Hospital Stay
Admission: EM | Admit: 2014-12-28 | Discharge: 2014-12-31 | DRG: 683 | Disposition: A | Discharge status: Skilled Nursing Facility | Payer: Medicare Other | Attending: Specialist | Admitting: Specialist

## 2014-12-28 ENCOUNTER — Inpatient Hospital Stay: Payer: Medicare Other

## 2014-12-28 ENCOUNTER — Encounter: Payer: Self-pay | Admitting: Internal Medicine

## 2014-12-28 DIAGNOSIS — F0390 Unspecified dementia without behavioral disturbance: Secondary | ICD-10-CM | POA: Diagnosis present

## 2014-12-28 DIAGNOSIS — E1151 Type 2 diabetes mellitus with diabetic peripheral angiopathy without gangrene: Secondary | ICD-10-CM | POA: Diagnosis present

## 2014-12-28 DIAGNOSIS — Z86718 Personal history of other venous thrombosis and embolism: Secondary | ICD-10-CM

## 2014-12-28 DIAGNOSIS — J9811 Atelectasis: Secondary | ICD-10-CM | POA: Diagnosis present

## 2014-12-28 DIAGNOSIS — I739 Peripheral vascular disease, unspecified: Secondary | ICD-10-CM | POA: Diagnosis present

## 2014-12-28 DIAGNOSIS — N179 Acute kidney failure, unspecified: Principal | ICD-10-CM | POA: Diagnosis present

## 2014-12-28 DIAGNOSIS — M1990 Unspecified osteoarthritis, unspecified site: Secondary | ICD-10-CM | POA: Diagnosis present

## 2014-12-28 DIAGNOSIS — Z794 Long term (current) use of insulin: Secondary | ICD-10-CM | POA: Diagnosis not present

## 2014-12-28 DIAGNOSIS — Z87891 Personal history of nicotine dependence: Secondary | ICD-10-CM

## 2014-12-28 DIAGNOSIS — I129 Hypertensive chronic kidney disease with stage 1 through stage 4 chronic kidney disease, or unspecified chronic kidney disease: Secondary | ICD-10-CM | POA: Diagnosis present

## 2014-12-28 DIAGNOSIS — N400 Enlarged prostate without lower urinary tract symptoms: Secondary | ICD-10-CM | POA: Diagnosis present

## 2014-12-28 DIAGNOSIS — N183 Chronic kidney disease, stage 3 unspecified: Secondary | ICD-10-CM

## 2014-12-28 DIAGNOSIS — E860 Dehydration: Secondary | ICD-10-CM | POA: Diagnosis present

## 2014-12-28 DIAGNOSIS — T502X5A Adverse effect of carbonic-anhydrase inhibitors, benzothiadiazides and other diuretics, initial encounter: Secondary | ICD-10-CM | POA: Diagnosis present

## 2014-12-28 DIAGNOSIS — J189 Pneumonia, unspecified organism: Secondary | ICD-10-CM

## 2014-12-28 DIAGNOSIS — E1122 Type 2 diabetes mellitus with diabetic chronic kidney disease: Secondary | ICD-10-CM

## 2014-12-28 DIAGNOSIS — H409 Unspecified glaucoma: Secondary | ICD-10-CM | POA: Diagnosis present

## 2014-12-28 DIAGNOSIS — Y929 Unspecified place or not applicable: Secondary | ICD-10-CM

## 2014-12-28 DIAGNOSIS — N189 Chronic kidney disease, unspecified: Secondary | ICD-10-CM

## 2014-12-28 DIAGNOSIS — Z79899 Other long term (current) drug therapy: Secondary | ICD-10-CM | POA: Diagnosis not present

## 2014-12-28 DIAGNOSIS — R531 Weakness: Secondary | ICD-10-CM

## 2014-12-28 DIAGNOSIS — E1140 Type 2 diabetes mellitus with diabetic neuropathy, unspecified: Secondary | ICD-10-CM | POA: Diagnosis present

## 2014-12-28 DIAGNOSIS — M549 Dorsalgia, unspecified: Secondary | ICD-10-CM

## 2014-12-28 DIAGNOSIS — E785 Hyperlipidemia, unspecified: Secondary | ICD-10-CM | POA: Diagnosis present

## 2014-12-28 DIAGNOSIS — Z888 Allergy status to other drugs, medicaments and biological substances status: Secondary | ICD-10-CM | POA: Diagnosis not present

## 2014-12-28 HISTORY — DX: Weakness: R53.1

## 2014-12-28 LAB — COMPREHENSIVE METABOLIC PANEL
ALBUMIN: 3.5 g/dL (ref 3.5–5.0)
ALT: 18 U/L (ref 17–63)
ANION GAP: 11 (ref 5–15)
AST: 41 U/L (ref 15–41)
Alkaline Phosphatase: 92 U/L (ref 38–126)
BUN: 44 mg/dL — AB (ref 6–20)
CALCIUM: 9.5 mg/dL (ref 8.9–10.3)
CO2: 27 mmol/L (ref 22–32)
CREATININE: 3.1 mg/dL — AB (ref 0.61–1.24)
Chloride: 98 mmol/L — ABNORMAL LOW (ref 101–111)
GFR calc Af Amer: 20 mL/min — ABNORMAL LOW (ref >60)
GFR calc non Af Amer: 17 mL/min — ABNORMAL LOW (ref >60)
GLUCOSE: 173 mg/dL — AB (ref 65–99)
Potassium: 4.1 mmol/L (ref 3.5–5.1)
Sodium: 136 mmol/L (ref 135–145)
Total Bilirubin: 0.5 mg/dL (ref 0.3–1.2)
Total Protein: 8.8 g/dL — ABNORMAL HIGH (ref 6.5–8.1)

## 2014-12-28 LAB — URINALYSIS COMPLETE WITH MICROSCOPIC (ARMC ONLY)
BACTERIA UA: NONE SEEN
Bilirubin Urine: NEGATIVE
Glucose, UA: NEGATIVE mg/dL
Ketones, ur: NEGATIVE mg/dL
Leukocytes, UA: NEGATIVE
Nitrite: NEGATIVE
PH: 5 (ref 5.0–8.0)
PROTEIN: 100 mg/dL — AB
Specific Gravity, Urine: 1.015 (ref 1.005–1.030)

## 2014-12-28 LAB — CBC WITH DIFFERENTIAL/PLATELET
Basophils Absolute: 0 10*3/uL (ref 0–0.1)
Basophils Relative: 1 %
EOS PCT: 1 %
Eosinophils Absolute: 0.1 10*3/uL (ref 0–0.7)
HEMATOCRIT: 36.2 % — AB (ref 40.0–52.0)
HEMOGLOBIN: 11.7 g/dL — AB (ref 13.0–18.0)
LYMPHS PCT: 16 %
Lymphs Abs: 1 10*3/uL (ref 1.0–3.6)
MCH: 29 pg (ref 26.0–34.0)
MCHC: 32.5 g/dL (ref 32.0–36.0)
MCV: 89.3 fL (ref 80.0–100.0)
Monocytes Absolute: 0.7 10*3/uL (ref 0.2–1.0)
Monocytes Relative: 11 %
NEUTROS PCT: 71 %
Neutro Abs: 4.6 10*3/uL (ref 1.4–6.5)
Platelets: 279 10*3/uL (ref 150–440)
RBC: 4.05 MIL/uL — ABNORMAL LOW (ref 4.40–5.90)
RDW: 15.9 % — AB (ref 11.5–14.5)
WBC: 6.4 10*3/uL (ref 3.8–10.6)

## 2014-12-28 LAB — GLUCOSE, CAPILLARY: GLUCOSE-CAPILLARY: 113 mg/dL — AB (ref 65–99)

## 2014-12-28 LAB — TSH: TSH: 2.074 u[IU]/mL (ref 0.350–4.500)

## 2014-12-28 LAB — TROPONIN I: Troponin I: <0.03 ng/mL (ref <0.031)

## 2014-12-28 MED ORDER — DONEPEZIL HCL 5 MG PO TABS
10.0000 mg | ORAL_TABLET | Freq: Every day | ORAL | Status: DC
  Administered 2014-12-28 – 2014-12-30 (×3): 10 mg via ORAL
  Filled 2014-12-28 (×3): qty 2

## 2014-12-28 MED ORDER — HEPARIN SODIUM (PORCINE) 5000 UNIT/ML IJ SOLN
5000.0000 [IU] | Freq: Three times a day (TID) | INTRAMUSCULAR | Status: DC
  Administered 2014-12-28 – 2014-12-31 (×8): 5000 [IU] via SUBCUTANEOUS
  Filled 2014-12-28 (×8): qty 1

## 2014-12-28 MED ORDER — TIMOLOL MALEATE 0.5 % OP SOLN
1.0000 [drp] | Freq: Two times a day (BID) | OPHTHALMIC | Status: DC
  Administered 2014-12-29 – 2014-12-31 (×6): 1 [drp] via OPHTHALMIC
  Filled 2014-12-28: qty 5

## 2014-12-28 MED ORDER — SODIUM CHLORIDE 0.9 % IV BOLUS (SEPSIS)
500.0000 mL | Freq: Once | INTRAVENOUS | Status: AC
  Administered 2014-12-28: 500 mL via INTRAVENOUS

## 2014-12-28 MED ORDER — MEMANTINE HCL ER 28 MG PO CP24
28.0000 mg | ORAL_CAPSULE | Freq: Every day | ORAL | Status: DC
  Administered 2014-12-29 – 2014-12-31 (×3): 28 mg via ORAL
  Filled 2014-12-28 (×4): qty 1

## 2014-12-28 MED ORDER — PREGABALIN 75 MG PO CAPS
75.0000 mg | ORAL_CAPSULE | Freq: Two times a day (BID) | ORAL | Status: DC
  Administered 2014-12-28 – 2014-12-31 (×6): 75 mg via ORAL
  Filled 2014-12-28 (×6): qty 1

## 2014-12-28 MED ORDER — EZETIMIBE 10 MG PO TABS
10.0000 mg | ORAL_TABLET | Freq: Every day | ORAL | Status: DC
  Administered 2014-12-29 – 2014-12-31 (×3): 10 mg via ORAL
  Filled 2014-12-28 (×4): qty 1

## 2014-12-28 MED ORDER — BENZONATATE 100 MG PO CAPS: 200.0000 mg | ORAL_CAPSULE | Freq: Three times a day (TID) | ORAL | Status: DC | PRN

## 2014-12-28 MED ORDER — ACETAMINOPHEN 325 MG PO TABS: 650.0000 mg | ORAL_TABLET | Freq: Four times a day (QID) | ORAL | Status: DC | PRN

## 2014-12-28 MED ORDER — FINASTERIDE 5 MG PO TABS
5.0000 mg | ORAL_TABLET | Freq: Every day | ORAL | Status: DC
  Administered 2014-12-29 – 2014-12-31 (×3): 5 mg via ORAL
  Filled 2014-12-28 (×3): qty 1

## 2014-12-28 MED ORDER — METOPROLOL TARTRATE 25 MG PO TABS
25.0000 mg | ORAL_TABLET | Freq: Two times a day (BID) | ORAL | Status: DC
  Administered 2014-12-28 – 2014-12-29 (×2): 25 mg via ORAL
  Filled 2014-12-28 (×2): qty 1

## 2014-12-28 MED ORDER — DULOXETINE HCL 60 MG PO CPEP
60.0000 mg | ORAL_CAPSULE | Freq: Every day | ORAL | Status: DC
  Administered 2014-12-29 – 2014-12-31 (×3): 60 mg via ORAL
  Filled 2014-12-28 (×3): qty 1

## 2014-12-28 MED ORDER — DOCUSATE SODIUM 100 MG PO CAPS: 100.0000 mg | ORAL_CAPSULE | Freq: Every day | ORAL | Status: DC | PRN

## 2014-12-28 MED ORDER — AMLODIPINE BESYLATE 10 MG PO TABS
10.0000 mg | ORAL_TABLET | Freq: Every day | ORAL | Status: DC
  Administered 2014-12-29 – 2014-12-31 (×3): 10 mg via ORAL
  Filled 2014-12-28 (×3): qty 1

## 2014-12-28 MED ORDER — SODIUM CHLORIDE 0.9 % IV SOLN
INTRAVENOUS | Status: DC
  Administered 2014-12-28 – 2014-12-30 (×4): via INTRAVENOUS

## 2014-12-28 MED ORDER — SODIUM CHLORIDE 0.9 % IV BOLUS (SEPSIS)
500.0000 mL | Freq: Once | INTRAVENOUS | Status: AC
Start: 2014-12-28 — End: 2014-12-28
  Administered 2014-12-28: 500 mL via INTRAVENOUS

## 2014-12-28 NOTE — ED Notes (Signed)
Pt here via EMS - [place] from home   Wife reports that he has had increased weakness since last pm

## 2014-12-28 NOTE — H&P (Signed)
[facility] - [place] at [facility]   PATIENT NAME: William Byrd    MR#:  [id number]  DATE OF BIRTH:  1931/10/21  DATE OF ADMISSION:  12/28/2014  PRIMARY CARE PHYSICIAN: Marisue Ivan, MD   REQUESTING/REFERRING PHYSICIAN: Mcshaen  CHIEF COMPLAINT:   Chief Complaint  Patient presents with  . Weakness  . Urinary Tract Infection    HISTORY OF PRESENT ILLNESS: William Byrd  is a 80 y.o. male with a known history of dementia, diabetes, hypertension, arthritis, chronic kidney disease, peripheral vascular disease, generalized weakness- lives at home with wife and for last many months he is using wheelchair throughout the day but usually he is able to get up from the wheelchair and go to the bed on his own. For last few days wife notices that he has been coughing a little more than usual, and also more weak. He could not transfer himself from wheelchair to bed last night. His urine for habits is urinary incontinence- and he wets his clothes and bed almost every night. But last few nights she noticed he did not wet the bed.  As he could not transfer himself to the bed last night he has to stay in wheelchair all night so today morning she finally called the rescue squad, brought to emergency room and noted to have worsening in his renal function. His urinalysis is negative for infection but his chest x-ray reported to have some atelectasis. White cell count is normal.  Patient had dementia so history obtained from his wife.  PAST MEDICAL HISTORY:   Past Medical History  Diagnosis Date  . Dementia   . Diabetes mellitus without complication (HCC)   . Hypertension   . Arthritis   . Chronic kidney disease   . Peripheral vascular disease (HCC)   . Generalized weakness     PAST SURGICAL HISTORY:  Past Surgical History  Procedure Laterality Date  . C-spine fusion N/A     SOCIAL HISTORY:  Social History  Substance Use Topics  . Smoking status: Former  Games developer  . Smokeless tobacco: Not on file  . Alcohol Use: No    FAMILY HISTORY:  Family History  Problem Relation Age of Onset  . Diabetes Mother   . Arthritis Sister     DRUG ALLERGIES:  Allergies  Allergen Reactions  . Fosinopril     Other reaction(s): Other (See Comments) Hyperkalemia     REVIEW OF SYSTEMS:   Patient has dementia and is not able to give me any history.  MEDICATIONS AT HOME:  Prior to Admission medications   Medication Sig Start Date End Date Taking? Authorizing Provider  acetaminophen (TYLENOL) 325 MG tablet Take 650 mg by mouth every 6 (six) hours as needed for mild pain, moderate pain, fever or headache.   Yes Historical Provider, MD  amLODipine (NORVASC) 10 MG tablet Take 10 mg by mouth daily.   Yes Historical Provider, MD  benzonatate (TESSALON) 200 MG capsule Take 200 mg by mouth 3 (three) times daily as needed for cough.   Yes Historical Provider, MD  cholecalciferol (VITAMIN D) 1000 UNITS tablet Take 1,000 Units by mouth daily.   Yes Historical Provider, MD  docusate sodium (STOOL SOFTENER) 100 MG capsule Take 100 mg by mouth daily as needed for mild constipation or moderate constipation.    Yes Historical Provider, MD  donepezil (ARICEPT) 10 MG tablet Take 10 mg by mouth at bedtime. 07/05/14  Yes Historical Provider, MD  DULoxetine (CYMBALTA) 60 MG capsule  Take 60 mg by mouth daily. 06/01/14  Yes Historical Provider, MD  ezetimibe (ZETIA) 10 MG tablet Take 1 tablet (10 mg total) by mouth daily. 09/08/14  Yes Richard Renae Gloss, MD  finasteride (PROSCAR) 5 MG tablet Take 5 mg by mouth daily. 07/19/14  Yes Historical Provider, MD  furosemide (LASIX) 20 MG tablet Take 20 mg by mouth daily.   Yes Historical Provider, MD  insulin glargine (LANTUS) 100 UNIT/ML injection Inject 20 Units into the skin at bedtime.    Yes Historical Provider, MD  insulin lispro (HUMALOG) 100 UNIT/ML injection Inject 10-15 Units into the skin See admin instructions. 10 units before  breakfast and before lunch, and 15 units before dinner   Yes Historical Provider, MD  ketorolac (ACULAR) 0.4 % SOLN Place 1 drop into both eyes 2 (two) times daily.   Yes Historical Provider, MD  linagliptin (TRADJENTA) 5 MG TABS tablet Take 5 mg by mouth daily.   Yes Historical Provider, MD  memantine (NAMENDA XR) 28 MG CP24 24 hr capsule Take 28 mg by mouth daily.   Yes Historical Provider, MD  pregabalin (LYRICA) 75 MG capsule Take 75 mg by mouth 2 (two) times daily.   Yes Historical Provider, MD  rivaroxaban (XARELTO) 20 MG TABS tablet Take 1 tablet by mouth daily. 07/18/14  Yes Historical Provider, MD  Saw Palmetto-Phytosterols (PROSTATE SR) 160-250 MG CAPS Take 1 capsule by mouth daily.   Yes Historical Provider, MD  timolol (TIMOPTIC) 0.5 % ophthalmic solution Place 1 drop into both eyes 2 (two) times daily.   Yes Historical Provider, MD  metoprolol tartrate (LOPRESSOR) 25 MG tablet Take 1 tablet (25 mg total) by mouth 2 (two) times daily. Patient not taking: Reported on 12/28/2014 09/08/14   [name], MD  torsemide (DEMADEX) 20 MG tablet TAKE ONE TABLET BY MOUTH ONCE DAILY Patient not taking: Reported on 12/28/2014 12/04/14   Sharon Seller, NP      PHYSICAL EXAMINATION:   VITAL SIGNS: Blood pressure 132/64, pulse 74, temperature 98.9 F (37.2 C), temperature source Oral, resp. rate 19, height  (1.727 m), weight 81.194 kg (179 lb), SpO2 99 %.  GENERAL:  81 y.o.-year-old patient lying in the bed with no acute distress.  EYES: Pupils equal, round, reactive to light and accommodation. No scleral icterus. Extraocular muscles intact.  HEENT: Head atraumatic, normocephalic. Oropharynx and nasopharynx clear. Mucosa dry. NECK:  Supple, no jugular venous distention. No thyroid enlargement, no tenderness.  LUNGS: Normal breath sounds bilaterally, no wheezing, rales,rhonchi or crepitation. No use of accessory muscles of respiration.  CARDIOVASCULAR: S1, S2 normal. No murmurs, rubs,  or gallops.  ABDOMEN: Soft, nontender, nondistended. Bowel sounds present. No organomegaly or mass.  EXTREMITIES: Slight pedal edema, cyanosis, or clubbing.  NEUROLOGIC: Cranial nerves II through XII are intact. Muscle strength 2/5 in lower extremities, 4/5 upper extremities. Sensation intact. Gait not checked.  PSYCHIATRIC: The patient is alert and demented.  SKIN: No obvious rash, lesion, or ulcer.   LABORATORY PANEL:   CBC  Recent Labs Lab 12/28/14 1042  WBC 6.4  HGB 11.7*  HCT 36.2*  PLT 279  MCV 89.3  MCH 29.0  MCHC 32.5  RDW 15.9*  LYMPHSABS 1.0  MONOABS 0.7  EOSABS 0.1  BASOSABS 0.0   ------------------------------------------------------------------------------------------------------------------  Chemistries   Recent Labs Lab 12/28/14 1042  NA 136  K 4.1  CL 98*  CO2 27  GLUCOSE 173*  BUN 44*  CREATININE 3.10*  CALCIUM 9.5  AST 41  ALT  18  ALKPHOS 92  BILITOT 0.5   ------------------------------------------------------------------------------------------------------------------ estimated creatinine clearance is 18.1 mL/min (by C-G formula based on Cr of 3.1). ------------------------------------------------------------------------------------------------------------------ No results for input(s): TSH, T4TOTAL, T3FREE, THYROIDAB in the last 72 hours.  Invalid input(s): FREET3   Coagulation profile No results for input(s): INR, PROTIME in the last 168 hours. ------------------------------------------------------------------------------------------------------------------- No results for input(s): DDIMER in the last 72 hours. -------------------------------------------------------------------------------------------------------------------  Cardiac Enzymes  Recent Labs Lab 12/28/14 1042  TROPONINI <0.03   ------------------------------------------------------------------------------------------------------------------ Invalid input(s):  POCBNP  ---------------------------------------------------------------------------------------------------------------  Urinalysis    Component Value Date/Time   COLORURINE YELLOW* 12/28/2014 1221   COLORURINE Yellow 02/22/2014 1648   APPEARANCEUR HAZY* 12/28/2014 1221   APPEARANCEUR Clear 02/22/2014 1648   LABSPEC 1.015 12/28/2014 1221   LABSPEC 1.017 02/22/2014 1648   PHURINE 5.0 12/28/2014 1221   PHURINE 5.0 02/22/2014 1648   GLUCOSEU NEGATIVE 12/28/2014 1221   GLUCOSEU >=500 02/22/2014 1648   HGBUR 2+* 12/28/2014 1221   HGBUR Negative 02/22/2014 1648   BILIRUBINUR NEGATIVE 12/28/2014 1221   BILIRUBINUR Negative 02/22/2014 1648   KETONESUR NEGATIVE 12/28/2014 1221   KETONESUR Negative 02/22/2014 1648   PROTEINUR 100* 12/28/2014 1221   PROTEINUR 100 mg/dL 29/56/213001/13/2016 [id number]   NITRITE NEGATIVE 12/28/2014 1221   NITRITE Negative 02/22/2014 1648   LEUKOCYTESUR NEGATIVE 12/28/2014 1221   LEUKOCYTESUR Negative 02/22/2014 1648     RADIOLOGY: Dg Chest 2 View  12/28/2014  CLINICAL DATA:  Weakness EXAM: CHEST  2 VIEW COMPARISON:  09/06/2014 FINDINGS: Right middle lobe atelectasis has developed since the prior study. Left lung is clear. Negative for heart failure or effusion. IMPRESSION: Right middle lobe atelectasis. Electronically Signed   By: Marlan Palauharles  Clark M.D.   On: 12/28/2014 11:56   Ct Head Wo Contrast  12/28/2014  CLINICAL DATA:  Generalized weakness, history of dementia EXAM: CT HEAD WITHOUT CONTRAST TECHNIQUE: Contiguous axial images were obtained from the base of the skull through the vertex without intravenous contrast. COMPARISON:  09/04/2014 FINDINGS: No skull fracture is noted. Paranasal sinuses and mastoid air cells are unremarkable. No intracranial hemorrhage, mass effect or midline shift. Stable atrophy and chronic white matter disease. Ventricular size is stable from prior exam. No definite acute cortical infarction. No mass lesion is noted on this unenhanced scan.  IMPRESSION: 1. No acute intracranial abnormality. Stable atrophy and chronic white matter disease. Electronically Signed   By: Natasha MeadLiviu  Pop M.D.   On: 12/28/2014 15:31    IMPRESSION AND PLAN:  * Acute on chronic renal failure  Most likely this is secondary to decreased oral intake to wife denies that,  We'll give IV fluid and monitor renal function.  Current electrolytes are under control..  * Atelectasis on lung The possibility of pneumonia though patient does not have any typical symptoms of that except for increased cough for a few days. His white cell count is not elevated  So I would like to get a CT scan of the chest to have further evaluation of this but no need to start antibiotic as he did not have fever and white cell count.  * Hypertension  Continue amlodipine metoprolol   * Dementia  Continue Aricept and Namenda.   * Generalized weakness   We'll get physical therapy evaluation to decide his discharge planning.   All the records are reviewed and case discussed with ED provider. Management plans discussed with the patient, family and they are in agreement.  CODE STATUS:Full code Finding and plan discussed with his wife who is healthcare power  of attorney and present in the room.   TOTAL TIME TAKING CARE OF THIS PATIENT: 50  minutes.    Altamese Dilling M.D on 12/28/2014   Between 7am to 6pm - Pager - [phone]  After 6pm go to [email or web address] - password [email or web address] [facility]  [facility]  Office  [phone]  CC: Primary care physician; Marisue Ivan, MD   Note: This dictation was prepared with Dragon dictation along with smaller phrase technology. Any transcriptional errors that result from this process are unintentional.

## 2014-12-28 NOTE — ED Provider Notes (Signed)
[facility] Emergency Department Provider Note  ____________________________________________   I have reviewed the triage vital signs and the nursing notes.   HISTORY  Chief Complaint Weakness and Urinary Tract Infection    HPI ULICE FOLLETT is a 81 y.o. male presents today complaining of weakness. He actually has no complaints his wife says he's been getting generally more weak since Sunday. He has a history of dementia and is poor historian. Apparently, when this happens, patient often will have a urinary tract infection. There is been no reported fever chills nausea vomiting he has been taking decreased by mouth however. He was begun Sunday felt better on Monday and in the last 2 days she's been getting progressively generalized weak again. Not focally weak. Patient has no complaints. At this time, he requires assistance getting out of bed.  Past Medical History  Diagnosis Date  . Dementia   . Diabetes mellitus without complication   . Hypertension   . Arthritis   . Chronic kidney disease   . Peripheral vascular disease     Patient Active Problem List   Diagnosis Date Noted  . Lower extremity weakness 09/05/2014  . DVT of lower extremity, bilateral (HCC) 09/05/2014  . DM2 (diabetes mellitus, type 2) (HCC) 09/05/2014  . HTN (hypertension) 09/05/2014  . Dementia 09/05/2014  . CKD (chronic kidney disease) stage 3, GFR 30-59 ml/min 09/05/2014  . HLD (hyperlipidemia) 09/05/2014  . Spinal stenosis 09/05/2014    Past Surgical History  Procedure Laterality Date  . C-spine fusion N/A     Current Outpatient Rx  Name  Route  Sig  Dispense  Refill  . acetaminophen (TYLENOL) 325 MG tablet   Oral   Take 650 mg by mouth every 6 (six) hours as needed for mild pain, moderate pain, fever or headache.         Marland Kitchen amLODipine (NORVASC) 10 MG tablet   Oral   Take 10 mg by mouth daily.         . benzonatate (TESSALON) 200 MG capsule   Oral   Take 200 mg  by mouth 3 (three) times daily as needed for cough.         . cholecalciferol (VITAMIN D) 1000 UNITS tablet   Oral   Take 1,000 Units by mouth daily.         Marland Kitchen docusate sodium (STOOL SOFTENER) 100 MG capsule   Oral   Take 100 mg by mouth daily as needed for mild constipation or moderate constipation.          Marland Kitchen donepezil (ARICEPT) 10 MG tablet   Oral   Take 10 mg by mouth at bedtime.         . DULoxetine (CYMBALTA) 60 MG capsule   Oral   Take 60 mg by mouth daily.         Marland Kitchen ezetimibe (ZETIA) 10 MG tablet   Oral   Take 1 tablet (10 mg total) by mouth daily.         . finasteride (PROSCAR) 5 MG tablet   Oral   Take 5 mg by mouth daily.         . furosemide (LASIX) 20 MG tablet   Oral   Take 20 mg by mouth daily.         . insulin glargine (LANTUS) 100 UNIT/ML injection   Subcutaneous   Inject 20 Units into the skin at bedtime.          . insulin  lispro (HUMALOG) 100 UNIT/ML injection   Subcutaneous   Inject 10-15 Units into the skin See admin instructions. 10 units before breakfast and before lunch, and 15 units before dinner         . ketorolac (ACULAR) 0.4 % SOLN   Both Eyes   Place 1 drop into both eyes 2 (two) times daily.         Marland Kitchen. linagliptin (TRADJENTA) 5 MG TABS tablet   Oral   Take 5 mg by mouth daily.         . memantine (NAMENDA XR) 28 MG CP24 24 hr capsule   Oral   Take 28 mg by mouth daily.         . pregabalin (LYRICA) 75 MG capsule   Oral   Take 75 mg by mouth 2 (two) times daily.         . rivaroxaban (XARELTO) 20 MG TABS tablet   Oral   Take 1 tablet by mouth daily.         Malvin Johns. Saw Palmetto-Phytosterols (PROSTATE SR) 160-250 MG CAPS   Oral   Take 1 capsule by mouth daily.         . timolol (TIMOPTIC) 0.5 % ophthalmic solution   Both Eyes   Place 1 drop into both eyes 2 (two) times daily.         . metoprolol tartrate (LOPRESSOR) 25 MG tablet   Oral   Take 1 tablet (25 mg total) by mouth 2 (two) times  daily. Patient not taking: Reported on 12/28/2014         . torsemide (DEMADEX) 20 MG tablet      TAKE ONE TABLET BY MOUTH ONCE DAILY Patient not taking: Reported on 12/28/2014   30 tablet   0     Allergies Fosinopril  Family History  Problem Relation Age of Onset  . Diabetes Mother   . Arthritis Sister     Social History Social History  Substance Use Topics  . Smoking status: Former Games developermoker  . Smokeless tobacco: Not on file  . Alcohol Use: No    Review of Systems, limited by patient dementia Constitutional: No fever/chills Eyes: No visual changes. ENT: No sore throat. No stiff neck no neck pain Cardiovascular: Denies chest pain. Respiratory: Denies shortness of breath. Gastrointestinal:   no vomiting.  No diarrhea.  No constipation. Genitourinary: Negative for dysuria. Musculoskeletal: Negative lower extremity swelling Skin: Negative for rash. Neurological: Negative for headaches, focal weakness or numbness. 10-point ROS otherwise negative.  ____________________________________________   PHYSICAL EXAM:  VITAL SIGNS: ED Triage Vitals  Enc Vitals Group     BP 12/28/14 1040 133/76 mmHg     Pulse Rate 12/28/14 1040 87     Resp 12/28/14 1100 16     Temp 12/28/14 1040 98.9 F (37.2 C)     Temp Source 12/28/14 1040 Oral     SpO2 12/28/14 1040 98 %     Weight 12/28/14 1040 179 lb (81.194 kg)     Height 12/28/14 1040 5\' 8"  (1.727 m)     Head Cir --      Peak Flow --      Pain Score --      Pain Loc --      Pain Edu? --      Excl. in GC? --     Constitutional: Alert and oriented to name and place unsure of date baseline per family Eyes: Conjunctivae are normal. PERRL. EOMI. Head: Atraumatic. Nose:  No congestion/rhinnorhea. Mouth/Throat: Mucous membranes are moist.  Oropharynx non-erythematous. Neck: No stridor.   Nontender with no meningismus Cardiovascular: Normal rate, regular rhythm. Grossly normal heart sounds.  Good peripheral  circulation. Respiratory: Normal respiratory effort.  No retractions. Lungs CTAB. Abdominal: Soft and nontender. No distention. No guarding no rebound Back:  There is no focal tenderness or step off there is no midline tenderness there are no lesions noted. there is no CVA tenderness Musculoskeletal: No lower extremity tenderness. No joint effusions, no DVT signs strong distal pulses bilateral symmetric edema Neurologic:  Normal speech and language. No gross focal neurologic deficits are appreciated. Diffusely weak, very difficult to get patient to be compliant with exam no obvious focal lesion noted Skin:  Skin is warm, dry and intact. No rash noted. Psychiatric: Mood and affect are normal. Speech and behavior are normal.  ____________________________________________   LABS (all labs ordered are listed, but only abnormal results are displayed)  Labs Reviewed  CBC WITH DIFFERENTIAL/PLATELET - Abnormal; Notable for the following:    RBC 4.05 (*)    Hemoglobin 11.7 (*)    HCT 36.2 (*)    RDW 15.9 (*)    All other components within normal limits  COMPREHENSIVE METABOLIC PANEL - Abnormal; Notable for the following:    Chloride 98 (*)    Glucose, Bld 173 (*)    BUN 44 (*)    Creatinine, Ser 3.10 (*)    Total Protein 8.8 (*)    GFR calc non Af Amer 17 (*)    GFR calc Af Amer 20 (*)    All other components within normal limits  URINALYSIS COMPLETEWITH MICROSCOPIC ([facility] ONLY) - Abnormal; Notable for the following:    Color, Urine YELLOW (*)    APPearance HAZY (*)    Hgb urine dipstick 2+ (*)    Protein, ur 100 (*)    Squamous Epithelial / LPF 0-5 (*)    All other components within normal limits  URINE CULTURE  TROPONIN I   ____________________________________________  EKG  I personally interpreted any EKGs ordered by me or triage Normal sinus rhythm rate 81 bpm no acute ST elevation or acute ST depression nonspecific ST changes  noted ____________________________________________  RADIOLOGY  I reviewed any imaging ordered by me or triage that were performed during my shift ____________________________________________   PROCEDURES  Procedure(s) performed: None  Critical Care performed: None  ____________________________________________   INITIAL IMPRESSION / ASSESSMENT AND PLAN / ED COURSE  Pertinent labs & imaging results that were available during my care of the patient were reviewed by me and considered in my medical decision making (see chart for details).  Patient with diffuse weakness and worsening renal function, unclear the etiology no obvious source of infection. We will give him some IV fluid as his creatinine is elevated I do feel that as he lives at home, can no longer get around without his wife's assistance and has worsening renal function will require observational admission. ____________________________________________   FINAL CLINICAL IMPRESSION(S) / ED DIAGNOSES  Final diagnoses:  None     Jeanmarie Plant, MD 12/28/14 1407

## 2014-12-28 NOTE — ED Notes (Signed)
He has gone to CT at this time - his spouse has gone home for the evening - I informed her that the nurse will call her to let her know what bed he has been assigned to so when she comes back tomorrow she will know where he is   William BuntingFannie Byrd

## 2014-12-29 ENCOUNTER — Inpatient Hospital Stay: Payer: Medicare Other

## 2014-12-29 LAB — BASIC METABOLIC PANEL
ANION GAP: 5 (ref 5–15)
BUN: 37 mg/dL — ABNORMAL HIGH (ref 6–20)
CHLORIDE: 107 mmol/L (ref 101–111)
CO2: 29 mmol/L (ref 22–32)
CREATININE: 2.26 mg/dL — AB (ref 0.61–1.24)
Calcium: 8.5 mg/dL — ABNORMAL LOW (ref 8.9–10.3)
GFR calc non Af Amer: 26 mL/min — ABNORMAL LOW (ref >60)
GFR, EST AFRICAN AMERICAN: 30 mL/min — AB (ref >60)
Glucose, Bld: 93 mg/dL (ref 65–99)
POTASSIUM: 3.7 mmol/L (ref 3.5–5.1)
SODIUM: 141 mmol/L (ref 135–145)

## 2014-12-29 LAB — CBC
HEMATOCRIT: 28.6 % — AB (ref 40.0–52.0)
HEMOGLOBIN: 9.7 g/dL — AB (ref 13.0–18.0)
MCH: 30.5 pg (ref 26.0–34.0)
MCHC: 34 g/dL (ref 32.0–36.0)
MCV: 89.6 fL (ref 80.0–100.0)
PLATELETS: 222 10*3/uL (ref 150–440)
RBC: 3.19 MIL/uL — AB (ref 4.40–5.90)
RDW: 16 % — ABNORMAL HIGH (ref 11.5–14.5)
WBC: 3.6 10*3/uL — AB (ref 3.8–10.6)

## 2014-12-29 LAB — GLUCOSE, CAPILLARY
GLUCOSE-CAPILLARY: 154 mg/dL — AB (ref 65–99)
GLUCOSE-CAPILLARY: 201 mg/dL — AB (ref 65–99)
GLUCOSE-CAPILLARY: 227 mg/dL — AB (ref 65–99)

## 2014-12-29 MED ORDER — INSULIN ASPART 100 UNIT/ML ~~LOC~~ SOLN
0.0000 [IU] | Freq: Every day | SUBCUTANEOUS | Status: DC
  Administered 2014-12-30: 3 [IU] via SUBCUTANEOUS
  Filled 2014-12-29: qty 2
  Filled 2014-12-29: qty 3

## 2014-12-29 MED ORDER — INSULIN ASPART 100 UNIT/ML ~~LOC~~ SOLN
0.0000 [IU] | Freq: Three times a day (TID) | SUBCUTANEOUS | Status: DC
  Administered 2014-12-29 – 2014-12-30 (×2): 3 [IU] via SUBCUTANEOUS
  Administered 2014-12-30: 2 [IU] via SUBCUTANEOUS
  Administered 2014-12-31: 08:00:00 1 [IU] via SUBCUTANEOUS
  Administered 2014-12-31: 2 [IU] via SUBCUTANEOUS
  Filled 2014-12-29: qty 2
  Filled 2014-12-29: qty 1
  Filled 2014-12-29: qty 3
  Filled 2014-12-29: qty 2
  Filled 2014-12-29: qty 3

## 2014-12-29 NOTE — Progress Notes (Signed)
[facility] - [place] at [facility]   PATIENT NAME: Oather Muilenburg    MR#:  [id number]  DATE OF BIRTH:  28-Feb-1931  SUBJECTIVE:  CHIEF COMPLAINT:   Chief Complaint  Patient presents with  . Weakness  . Urinary Tract Infection   Patient here due to weakness and also noted to be in acute on chronic renal failure. Wife at bedside. As per the wife patient has been complaining of left leg pain and has a history of sciatica and herniated disc many years ago.  REVIEW OF SYSTEMS:    Review of Systems  Unable to perform ROS: dementia    Nutrition: Heart Healthy Tolerating Diet: yes Tolerating PT: Await Evaluation.    DRUG ALLERGIES:   Allergies  Allergen Reactions  . Fosinopril     Other reaction(s): Other (See Comments) Hyperkalemia     VITALS:  Blood pressure 146/73, pulse 58, temperature 97.6 F (36.4 C), temperature source Oral, resp. rate 20, height  (1.727 m), weight 81.194 kg (179 lb), SpO2 97 %.  PHYSICAL EXAMINATION:   Physical Exam  GENERAL:  81 y.o.-year-old patient lying in the bed with no acute distress.  EYES: Pupils equal, round, reactive to light and accommodation. No scleral icterus. Extraocular muscles intact.  HEENT: Head atraumatic, normocephalic. Oropharynx and nasopharynx clear.  NECK:  Supple, no jugular venous distention. No thyroid enlargement, no tenderness.  LUNGS: Good A/E b/l, no wheezing, rhonchi, bibasilar rales. No use of accessory muscles of respiration.  CARDIOVASCULAR: S1, S2 normal. No murmurs, rubs, or gallops.  ABDOMEN: Soft, nontender, nondistended. Bowel sounds present. No organomegaly or mass.  EXTREMITIES: No cyanosis, clubbing or edema b/l.    NEUROLOGIC: Cranial nerves II through XII are intact. No focal Motor or sensory deficits b/l. Globally weak.  PSYCHIATRIC: The patient is alert and oriented x 1.  SKIN: No obvious rash, lesion, or ulcer.    LABORATORY PANEL:   CBC  Recent Labs Lab  12/29/14 0605  WBC 3.6*  HGB 9.7*  HCT 28.6*  PLT 222   ------------------------------------------------------------------------------------------------------------------  Chemistries   Recent Labs Lab 12/28/14 1042 12/29/14 0605  NA 136 141  K 4.1 3.7  CL 98* 107  CO2 27 29  GLUCOSE 173* 93  BUN 44* 37*  CREATININE 3.10* 2.26*  CALCIUM 9.5 8.5*  AST 41  --   ALT 18  --   ALKPHOS 92  --   BILITOT 0.5  --    ------------------------------------------------------------------------------------------------------------------  Cardiac Enzymes  Recent Labs Lab 12/28/14 1042  TROPONINI <0.03   ------------------------------------------------------------------------------------------------------------------  RADIOLOGY:  Dg Chest 2 View  12/28/2014  CLINICAL DATA:  Weakness EXAM: CHEST  2 VIEW COMPARISON:  09/06/2014 FINDINGS: Right middle lobe atelectasis has developed since the prior study. Left lung is clear. Negative for heart failure or effusion. IMPRESSION: Right middle lobe atelectasis. Electronically Signed   By: Marlan Palau M.D.   On: 12/28/2014 11:56   Ct Head Wo Contrast  12/28/2014  CLINICAL DATA:  Generalized weakness, history of dementia EXAM: CT HEAD WITHOUT CONTRAST TECHNIQUE: Contiguous axial images were obtained from the base of the skull through the vertex without intravenous contrast. COMPARISON:  09/04/2014 FINDINGS: No skull fracture is noted. Paranasal sinuses and mastoid air cells are unremarkable. No intracranial hemorrhage, mass effect or midline shift. Stable atrophy and chronic white matter disease. Ventricular size is stable from prior exam. No definite acute cortical infarction. No mass lesion is noted on this unenhanced scan. IMPRESSION: 1. No acute intracranial  abnormality. Stable atrophy and chronic white matter disease. Electronically Signed   By: Natasha Mead M.D.   On: 12/28/2014 15:31   Ct Chest Wo Contrast  12/28/2014  CLINICAL DATA:   81 year old complains of weakness.  Pneumonia. EXAM: CT CHEST WITHOUT CONTRAST TECHNIQUE: Multidetector CT imaging of the chest was performed following the standard protocol without IV contrast. COMPARISON:  Chest radiograph 12/28/2014 FINDINGS: No significant chest lymphadenopathy. Large calcification in the subcarinal region. There is no significant pericardial or pleural fluid. There are coronary artery calcifications. Innumerable calcifications throughout the spleen. 4 mm stone in the left kidney lower pole without hydronephrosis. Punctate stone in the right kidney lower pole without hydronephrosis. Multiple calcifications in the liver. Atherosclerotic calcifications in the aorta without aneurysm. Trachea and mainstem bronchi are patent. Filling defects throughout the distal bronchus intermedius and within the right lower lobe airway branches. There is narrowing at the origin of the right middle lobe bronchus. Volume loss in the right middle lobe. 3 mm pleural-based nodular density in the right upper lobe on sequence 3, image 12. Patchy parenchymal densities in the right upper lobe. Patchy parenchymal densities in right lower lobe. No significant airspace disease or consolidation in the left lung. There is mild peribronchial thickening in the left lower lobe. No acute bone abnormality. Multilevel degenerative changes in the thoracic spine. IMPRESSION: Endobronchial material throughout the bronchus intermedius and extending into the right lower lobe airways. Findings are suggestive for mucous plugging or aspiration. Volume loss in the right middle lobe. Endobronchial lesion or lesions cannot be excluded, particularly near the origin of the right middle lobe and right lower lobe bronchi. Patchy parenchymal densities in the right lung are probably related to a combination of volume loss and an infectious/inflammatory process. Indeterminate 3 mm pleural-based nodule in the right upper lung. If the patient is at high  risk for bronchogenic carcinoma, follow-up chest CT at 1 year is recommended. If the patient is at low risk, no follow-up is needed. This recommendation follows the consensus statement: Guidelines for Management of Small Pulmonary Nodules Detected on CT Scans: A Statement from the Fleischner Society as published in Radiology 2005; 237:395-400. Evidence for old granulomatous disease. Bilateral nonobstructive renal calculi. Electronically Signed   By: Richarda Overlie M.D.   On: 12/28/2014 17:08     ASSESSMENT AND PLAN:   81 year old male with past medical history of dementia, BPH, chronic renal failure, diabetes, peripheral vascular disease, osteoarthritis who presented to the hospital with generalized weakness and a cough.  #1 acute on chronic renal failure-this is likely secondary to dehydration and concomitant use of diuretics. -Patient baseline creatinine is around 1.6-1.8. Currently elevated at 2.2 -Continue IV fluids, avoid nephrotoxins and follow BUN/creatinine urine output.  #2 pneumonia/atelectasis-patient had a cough which was present on admission.  Clinically he does not show any evidence of pneumonia. He has no fever, elevated white cell count. -CT scan of the chest showing debris within the endobronchial tree, questionable aspiration. I will get her speech therapy evaluation, hold off on antibiotics and follow clinically.  #3 hypertension-continue Norvasc  #4 history of DVT-patient's DVT was over a year ago. - will d/c Xarelto  #5 history of BPH-no evidence of urinary retention. Continue finasteride  #6 history of dementia-continue Namenda, Aricept.  #7 diabetes type 2 without complication-blood sugars are currently stable. Hold Tradjenta, Lantus. -Continue sliding scale insulin  #8 diabetic neuropathy-continue Lyrica.  #9 hyperlipidemia-continue Zetia  #10 glaucoma-continue timolol.    Await physical therapy evaluation.  All the records are reviewed and case discussed with  Care Management/Social Workerr. Management plans discussed with the patient, family and they are in agreement.  CODE STATUS: Full  DVT Prophylaxis: Heparin subcutaneous  TOTAL TIME TAKING CARE OF THIS PATIENT: 30 minutes.   POSSIBLE D/C IN 1-2 DAYS, DEPENDING ON CLINICAL CONDITION.   Houston SirenSAINANI,VIVEK J M.D on 12/29/2014 at 2:38 PM  Between 7am to 6pm - Pager - [phone]  After 6pm go to [email or web address] - password [email or web address] [facility]  [facility]  Office  [phone]  CC: Primary care physician; [name], [name], MD

## 2014-12-29 NOTE — [place] FL2 (Signed)
[place] MEDICAID FL2 LEVEL OF CARE SCREENING TOOL     IDENTIFICATION  Patient Name: William Byrd Birthdate: Dec 04, 1931 Sex: male Admission Date (Current Location): 12/28/2014  [place] Number:  [facility] )   Facility and Address:  [facility], [address] [place], [place] [address]      Provider Number: [id number]  Attending Physician Name and Address:  Houston Siren, MD  Relative Name and Phone Number:       Current Level of Care: Hospital Recommended Level of Care: Skilled Nursing Facility Prior Approval Number:    Date Approved/Denied:   PASRR Number:  ( [id number] )  Discharge Plan: SNF    Current Diagnoses: Patient Active Problem List   Diagnosis Date Noted  . Dehydration 12/28/2014  . Acute on chronic renal failure (HCC) 12/28/2014  . Generalized weakness 12/28/2014  . Acute renal failure (ARF) (HCC) 12/28/2014  . Lower extremity weakness 09/05/2014  . DVT of lower extremity, bilateral (HCC) 09/05/2014  . DM2 (diabetes mellitus, type 2) (HCC) 09/05/2014  . HTN (hypertension) 09/05/2014  . Dementia 09/05/2014  . CKD (chronic kidney disease) stage 3, GFR 30-59 ml/min 09/05/2014  . HLD (hyperlipidemia) 09/05/2014  . Spinal stenosis 09/05/2014    Orientation ACTIVITIES/SOCIAL BLADDER RESPIRATION    Self  Passive Incontinent Normal  BEHAVIORAL SYMPTOMS/MOOD NEUROLOGICAL BOWEL NUTRITION STATUS   (none )  (none ) Incontinent Diet (Diet: Heart Healthy )  PHYSICIAN VISITS COMMUNICATION OF NEEDS Height & Weight Skin  30 days Verbally  (172.7 cm) 179 lbs. Other (Comment) (Left Heel: Wound )          AMBULATORY STATUS RESPIRATION    Assist extensive Normal      Personal Care Assistance Level of Assistance  Bathing, Feeding, Dressing Bathing Assistance: Limited assistance Feeding assistance: Independent Dressing Assistance: Limited assistance      Functional Limitations Info  Sight, Hearing,  Speech Sight Info: Adequate Hearing Info: Adequate Speech Info: Adequate       SPECIAL CARE FACTORS FREQUENCY  PT (By licensed PT), OT (By licensed OT)     PT Frequency:  (5) OT Frequency:  (5)           Additional Factors Info  Code Status, Allergies, Insulin Sliding Scale Code Status Info:  (Full Code. ) Allergies Info:  (Fosinopril)   Insulin Sliding Scale Info:  (Novolong Insulin Injections )       Current Medications (12/29/2014): Current Facility-Administered Medications  Medication Dose Route Frequency Provider Last Rate Last Dose  . 0.9 %  sodium chloride infusion   Intravenous Continuous Altamese Dilling, MD 100 mL/hr at 12/29/14 0838    . acetaminophen (TYLENOL) tablet 650 mg  650 mg Oral Q6H PRN Altamese Dilling, MD      . amLODipine (NORVASC) tablet 10 mg  10 mg Oral Daily Altamese Dilling, MD   10 mg at 12/29/14 0944  . benzonatate (TESSALON) capsule 200 mg  200 mg Oral TID PRN Altamese Dilling, MD      . docusate sodium (COLACE) capsule 100 mg  100 mg Oral Daily PRN Altamese Dilling, MD      . donepezil (ARICEPT) tablet 10 mg  10 mg Oral QHS Altamese Dilling, MD   10 mg at 12/28/14 2223  . DULoxetine (CYMBALTA) DR capsule 60 mg  60 mg Oral Daily Altamese Dilling, MD   60 mg at 12/29/14 0944  . ezetimibe (ZETIA) tablet 10 mg  10 mg Oral Daily Altamese Dilling, MD  10 mg at 12/29/14 0944  . finasteride (PROSCAR) tablet 5 mg  5 mg Oral Daily Altamese Dilling, MD   5 mg at 12/29/14 0943  . heparin injection 5,000 Units  5,000 Units Subcutaneous 3 times per day Altamese Dilling, MD   5,000 Units at 12/29/14 0549  . insulin aspart (novoLOG) injection 0-5 Units  0-5 Units Subcutaneous QHS Houston Siren, MD      . insulin aspart (novoLOG) injection 0-9 Units  0-9 Units Subcutaneous TID WC Houston Siren, MD      . memantine (NAMENDA XR) 24 hr capsule 28 mg  28 mg Oral Daily Altamese Dilling, MD   28 mg at  12/29/14 0943  . pregabalin (LYRICA) capsule 75 mg  75 mg Oral BID Altamese Dilling, MD   75 mg at 12/29/14 0944  . timolol (TIMOPTIC) 0.5 % ophthalmic solution 1 drop  1 drop Both Eyes BID Altamese Dilling, MD   1 drop at 12/29/14 0944   Do not use this list as official medication orders. Please verify with discharge summary.  Discharge Medications:   Medication List    ASK your doctor about these medications        acetaminophen 325 MG tablet  Commonly known as:  TYLENOL  Take 650 mg by mouth every 6 (six) hours as needed for mild pain, moderate pain, fever or headache.     amLODipine 10 MG tablet  Commonly known as:  NORVASC  Take 10 mg by mouth daily.     benzonatate 200 MG capsule  Commonly known as:  TESSALON  Take 200 mg by mouth 3 (three) times daily as needed for cough.     cholecalciferol 1000 UNITS tablet  Commonly known as:  VITAMIN D  Take 1,000 Units by mouth daily.     donepezil 10 MG tablet  Commonly known as:  ARICEPT  Take 10 mg by mouth at bedtime.     DULoxetine 60 MG capsule  Commonly known as:  CYMBALTA  Take 60 mg by mouth daily.     ezetimibe 10 MG tablet  Commonly known as:  ZETIA  Take 1 tablet (10 mg total) by mouth daily.     finasteride 5 MG tablet  Commonly known as:  PROSCAR  Take 5 mg by mouth daily.     furosemide 20 MG tablet  Commonly known as:  LASIX  Take 20 mg by mouth daily.     insulin glargine 100 UNIT/ML injection  Commonly known as:  LANTUS  Inject 20 Units into the skin at bedtime.     insulin lispro 100 UNIT/ML injection  Commonly known as:  HUMALOG  Inject 10-15 Units into the skin See admin instructions. 10 units before breakfast and before lunch, and 15 units before dinner     ketorolac 0.4 % Soln  Commonly known as:  ACULAR  Place 1 drop into both eyes 2 (two) times daily.     linagliptin 5 MG Tabs tablet  Commonly known as:  TRADJENTA  Take 5 mg by mouth daily.     metoprolol tartrate 25 MG  tablet  Commonly known as:  LOPRESSOR  Take 1 tablet (25 mg total) by mouth 2 (two) times daily.     NAMENDA XR 28 MG Cp24 24 hr capsule  Generic drug:  memantine  Take 28 mg by mouth daily.     pregabalin 75 MG capsule  Commonly known as:  LYRICA  Take 75 mg by mouth 2 (two)  times daily.     PROSTATE SR 160-250 MG Caps  Take 1 capsule by mouth daily.     STOOL SOFTENER 100 MG capsule  Generic drug:  docusate sodium  Take 100 mg by mouth daily as needed for mild constipation or moderate constipation.     timolol 0.5 % ophthalmic solution  Commonly known as:  TIMOPTIC  Place 1 drop into both eyes 2 (two) times daily.     torsemide 20 MG tablet  Commonly known as:  DEMADEX  TAKE ONE TABLET BY MOUTH ONCE DAILY     XARELTO 20 MG Tabs tablet  Generic drug:  rivaroxaban  Take 1 tablet by mouth daily.        Relevant Imaging Results:  Relevant Lab Results:  Recent Labs    Additional Information  (SSN: [id number])  Haig ProphetMorgan, Saory Carriero G, LCSW

## 2014-12-29 NOTE — Care Management Note (Signed)
Case Management Note  Patient Details  Name: Sampson SiJames C Difonzo MRN: [id number] Date of Birth: 10/07/1931  Subjective/Objective:                 Patient presents from home acute on chronic respiratory failure. Patient has history of dementia and lives at home with his wife.  Patient obtains his medications from [facility] on [address].  Patient's brother in law is local for support.   They have adult children who all live out of state.  Patient Has a walker, WB, BSC at home.  Patient has Medical laboratory scientific officerremier nurse aid services 3 days a week for 2 hours a day.  Patient's wife provides transportation when needed.  PT is currently recommending SNF, wife would like to speak to CSW about this option.  [facility]  RNCM to follow for discharge planning   Action/Plan:   Expected Discharge Date:                  Expected Discharge Plan:     In-House Referral:     Discharge planning Services     Post Acute Care Choice:    Choice offered to:     DME Arranged:    DME Agency:     HH Arranged:    HH Agency:     Status of Service:     Medicare Important Message Given:  Yes Date Medicare IM Given:    Medicare IM give by:    Date Additional Medicare IM Given:    Additional Medicare Important Message give by:     If discussed at Long Length of Stay Meetings, dates discussed:    Additional Comments:  Chapman FitchBOWEN, Davita Sublett T, RN 12/29/2014, 1:48 PM

## 2014-12-29 NOTE — Clinical Social Work Note (Signed)
Clinical Social Work Assessment  Patient Details  Name: William Byrd MRN: [id number] Date of Birth: [birth year]  Date of referral:  12/29/14               Reason for consult:  Facility Placement                Permission sought to share information with:  Family Supports Permission granted to share information::  Yes, Verbal Permission Granted           Housing/Transportation Living arrangements for the past 2 months:  Single Family Home Source of Information:  Patient Patient Interpreter Needed:  None Criminal Activity/Legal Involvement Pertinent to Current Situation/Hospitalization:  No - Comment as needed Significant Relationships:  Spouse Lives with:  Spouse Do you feel safe going back to the place where you live?  Yes Need for family participation in patient care:  No (Coment)  Care giving concerns:  No care giving concerns identified.    Social Worker assessment / plan:  CSW met with pt and wife to address consult. CSW introduced herself and explained role of social work. CSW also explained process of discharging to SNF. Pt has been to SNF before and is agreeable to go again. CSW initiated referral. Will follow up with bed offers.   Employment status:   Retired Forensic scientist:  Medicare PT Recommendations:  [place] Solis / Referral to community resources:  [place]  Patient/Family's Response to care:  Pt and family were appreciative of CSW support.   Patient/Family's Understanding of and Emotional Response to Diagnosis, Current Treatment, and Prognosis:  Pt's wife understands Pt needs a STr prior to returing home.   Emotional Assessment Appearance:  Appears stated age Attitude/Demeanor/Rapport:  Other (Confused) Affect (typically observed):  Accepting Orientation:  Oriented to Self Alcohol / Substance use:    Psych involvement (Current and /or in the community):  No (Comment)  Discharge Needs  Concerns to be addressed:   No discharge needs identified Readmission within the last 30 days:  No Current discharge risk:  None Barriers to Discharge:  No Barriers Identified   Darden Dates, LCSW 12/29/2014, 5:07 PM

## 2014-12-29 NOTE — Progress Notes (Signed)
PT Cancellation Note  Patient Details Name: William Byrd MRN: [id number] DOB: 05/30/1931   Cancelled Treatment:    Reason Eval/Treat Not Completed: Other (comment) (Evaluation attempted. Patient currently eating breakfast.  Will re-attempt at later time this date as patient available.)   Isidora Laham H. Manson PasseyBrown, PT, DPT, NCS 12/29/2014, 8:55 AM [phone]

## 2014-12-29 NOTE — Progress Notes (Signed)
Initial Nutrition Assessment    INTERVENTION:  Meals and snacks: cater to pt preferences    NUTRITION DIAGNOSIS:   Altered nutrition lab value related to acute illness as evidenced by  (elevated BUN and creatinine).    GOAL:   Patient will meet greater than or equal to 90% of their needs    MONITOR:    (Energy intake, Electrolyte and renal profile)  REASON FOR ASSESSMENT:   Diagnosis    ASSESSMENT:      Pt admitted with acute on chronic renal failure, possible pneumonia  Past Medical History  Diagnosis Date  . Dementia   . Diabetes mellitus without complication (HCC)   . Hypertension   . Arthritis   . Chronic kidney disease   . Peripheral vascular disease (HCC)   . Generalized weakness      Current Nutrition: ate 100% of chicken and mashed potatoes today for lunch and few bites of carrots. Ate good breakfast too per pt   Food/Nutrition-Related History: wife reports good appetite prior to admission   Scheduled Medications:  . amLODipine  10 mg Oral Daily  . donepezil  10 mg Oral QHS  . DULoxetine  60 mg Oral Daily  . ezetimibe  10 mg Oral Daily  . finasteride  5 mg Oral Daily  . heparin  5,000 Units Subcutaneous 3 times per day  . insulin aspart  0-5 Units Subcutaneous QHS  . insulin aspart  0-9 Units Subcutaneous TID WC  . memantine  28 mg Oral Daily  . pregabalin  75 mg Oral BID  . timolol  1 drop Both Eyes BID    Continuous Medications:  . sodium chloride 100 mL/hr at 12/29/14 0838     Electrolyte/Renal Profile and Glucose Profile:   Recent Labs Lab 12/28/14 1042 12/29/14 0605  NA 136 141  K 4.1 3.7  CL 98* 107  CO2 27 29  BUN 44* 37*  CREATININE 3.10* 2.26*  CALCIUM 9.5 8.5*  GLUCOSE 173* 93   Protein Profile:  Recent Labs Lab 12/28/14 1042  ALBUMIN 3.5    Gastrointestinal Profile: Last BM:11/18   Nutrition-Focused Physical Exam Findings:  Unable to complete Nutrition-Focused physical exam at this time.      Weight Change: stable wt   Diet Order:  Diet Heart Room service appropriate?: Yes; Fluid consistency:: Thin  Skin:   noted left heel wound   Height:   Ht Readings from Last 1 Encounters:  12/28/14 5\' 8"  (1.727 m)    Weight:   Wt Readings from Last 1 Encounters:  12/28/14 179 lb (81.194 kg)    Ideal Body Weight:     BMI:  Body mass index is 27.22 kg/(m^2).   EDUCATION NEEDS:   No education needs identified at this time  LOW Care Level  Patterson Hollenbaugh B. Freida BusmanAllen, RD, LDN [phone] (pager)

## 2014-12-29 NOTE — Care Management Important Message (Signed)
Important Message  Patient Details  Name: Sampson SiJames C Gorum MRN: [id number] Date of Birth: 01/16/1932   Medicare Important Message Given:  Yes    Chapman FitchBOWEN, Jhair Witherington T, RN 12/29/2014, 10:07 AM

## 2014-12-29 NOTE — Evaluation (Addendum)
**Late entry note for 12/29/14, [id number]; entered 12/30/14 at 1735. Tyrea Froberg H. Manson PasseyBrown, PT, DPT, NCS 12/30/2014, 5:32 PM [phone]   Physical Therapy Evaluation Patient Details Name: William Byrd MRN: [id number] DOB: 06/18/1931 Today's Date: 12/29/2014   History of Present Illness  presented to ER with progressive weakness and inability to transfer self in home environment; admitted with acute renal failure  Clinical Impression  Upon evaluation, patient alert and oriented to self; follows simple commands.  Demonstrates strength and ROM grossly symmetrical and WFL for basic transfers and mobility, but generally weak/deconditioned compared to baseline.  Currently requiring min/mod assist for unsupported sitting balance on edge of bed (progressive R lateral lean/LOB with fatigue); mod assist +1 for sit/stand and forward/backward stepping with RW.  Maintains very forward flexed posture that patient unable to correct with fatigue.  Extremely high fall risk with functional activity at this time.  Unable to tolerate additional activity or attempts at bed/chair this date. Would benefit from skilled PT to address above deficits and promote optimal return to PLOF; recommend transition to STR upon discharge from acute hospitalization.     Follow Up Recommendations SNF    Equipment Recommendations       Recommendations for Other Services       Precautions / Restrictions Precautions Precautions: Fall Restrictions Weight Bearing Restrictions: No      Mobility  Bed Mobility Overal bed mobility: Needs Assistance Bed Mobility: Supine to Sit;Sit to Supine     Supine to sit: Mod assist Sit to supine: Mod assist      Transfers Overall transfer level: Needs assistance Equipment used: Rolling walker (2 wheeled) Transfers: Sit to/from Stand Sit to Stand: Mod assist         General transfer comment: hand-over-hand assist for UE placement on RW; very slow, effortful movement  transition  Ambulation/Gait Ambulation/Gait assistance: Mod assist Ambulation Distance (Feet): 2 Feet Assistive device: Rolling walker (2 wheeled)       General Gait Details: 1-2 steps forward/backward with RW, mod assist +1; maintains very forward flexed posture with sustained bilat hip/knee flexion (with progressive increase with fatigue).  Unable to tolerate activity beyond 1-2 steps with RW; poor balance, high fall risk.  Stairs            Wheelchair Mobility    Modified Rankin (Stroke Patients Only)       Balance Overall balance assessment: Needs assistance Sitting-balance support: No upper extremity supported;Feet supported Sitting balance-Leahy Scale: Fair Sitting balance - Comments: fluctuates between close sup to mod assist for R lateral trunk lean/LOB with delayed balance/righting reactions.   Standing balance support: Bilateral upper extremity supported Standing balance-Leahy Scale: Poor                               Pertinent Vitals/Pain Pain Assessment: No/denies pain    Home Living Family/patient expects to be discharged to:: Private residence Living Arrangements: Spouse/significant other Available Help at Discharge: Family Type of Home: House Home Access: Ramped entrance     Home Layout: One level Home Equipment: Environmental consultantWalker - 2 wheels;Wheelchair - manual      Prior Function Level of Independence: Needs assistance         Comments: Utilizes manual WC for primary mobility within the home environment, but does ambulate limited distances with RW at times.  [facility], but does require physical assist from wife for ADLs (incontinent at baseline)  Hand Dominance        Extremity/Trunk Assessment   Upper Extremity Assessment: Overall WFL for tasks assessed           Lower Extremity Assessment: Overall WFL for tasks assessed (globally at least 4/5, very slow to activate)          Communication   Communication: No difficulties  Cognition Arousal/Alertness: Awake/alert Behavior During Therapy: WFL for tasks assessed/performed Overall Cognitive Status: History of cognitive impairments - at baseline                      General Comments      Exercises Other Exercises Other Exercises: Unsupported sitting balance edge of bed, sup to mod assist with delayed balance/righting reations Other Exercises: Seated LE therex, 1x10, AROM for muscular strength/endurance with functional activities      Assessment/Plan    PT Assessment Patient needs continued PT services  PT Diagnosis Difficulty walking;Generalized weakness   PT Problem List Decreased strength;Decreased range of motion;Decreased activity tolerance;Decreased knowledge of use of DME;Decreased mobility;Decreased balance;Decreased cognition;Decreased safety awareness;Decreased knowledge of precautions  PT Treatment Interventions DME instruction;Gait training;Stair training;Functional mobility training;Therapeutic activities;Therapeutic exercise;Patient/family education   PT Goals (Current goals can be found in the Care Plan section) Acute Rehab PT Goals Patient Stated Goal: patient unable to formulate due to confusion; per wife, to return home as able PT Goal Formulation: With patient/family Time For Goal Achievement: 01/12/15 Potential to Achieve Goals: Fair    Frequency Min 2X/week   Barriers to discharge        Co-evaluation               End of Session Equipment Utilized During Treatment: Gait belt Activity Tolerance: Patient tolerated treatment well;Patient limited by fatigue Patient left: in bed;with call bell/phone within reach;with bed alarm set;with family/visitor present           Time: 0936-1006 PT Time Calculation (min) (ACUTE ONLY): 30 min   Charges:   PT Evaluation $Initial PT Evaluation Tier I: 1 Procedure PT Treatments $Therapeutic Exercise: 8-22 mins    PT G Codes:        Christhoper Busbee H. Manson Passey, PT, DPT, NCS 12/30/2014, 5:28 PM [phone]  Addendum:  Incorrect date initial entered; corrected on addendum. Shandrell Boda H. Manson Passey, PT, DPT, NCS 12/30/2014, 5:35 PM [phone]

## 2014-12-29 NOTE — Progress Notes (Signed)
Patient is alert to self only and incontinent of urine and stool  VSS Patient continues on IV Fluids  Wife at beside PT came to work with patient but was unable to complete assessment as patient was eating  Bed alarm on and fall precautions maintained

## 2014-12-29 NOTE — Plan of Care (Signed)
Problem: Skin Integrity: Goal: Risk for impaired skin integrity will decrease Outcome: Progressing Plan of Care Progress to Goal:   Pt denies pain. Pt is incontinent. Pt rested comfortable the rest of the shift. No other signs of distress noted. Will continue to monitor.

## 2014-12-30 LAB — BASIC METABOLIC PANEL
ANION GAP: 4 — AB (ref 5–15)
BUN: 28 mg/dL — ABNORMAL HIGH (ref 6–20)
CHLORIDE: 109 mmol/L (ref 101–111)
CO2: 27 mmol/L (ref 22–32)
CREATININE: 1.63 mg/dL — AB (ref 0.61–1.24)
Calcium: 8.2 mg/dL — ABNORMAL LOW (ref 8.9–10.3)
GFR calc non Af Amer: 38 mL/min — ABNORMAL LOW (ref >60)
GFR, EST AFRICAN AMERICAN: 44 mL/min — AB (ref >60)
Glucose, Bld: 106 mg/dL — ABNORMAL HIGH (ref 65–99)
POTASSIUM: 3.4 mmol/L — AB (ref 3.5–5.1)
SODIUM: 140 mmol/L (ref 135–145)

## 2014-12-30 LAB — URINE CULTURE: Culture: NO GROWTH

## 2014-12-30 LAB — GLUCOSE, CAPILLARY
GLUCOSE-CAPILLARY: 108 mg/dL — AB (ref 65–99)
GLUCOSE-CAPILLARY: 198 mg/dL — AB (ref 65–99)
GLUCOSE-CAPILLARY: 212 mg/dL — AB (ref 65–99)
Glucose-Capillary: 284 mg/dL — ABNORMAL HIGH (ref 65–99)

## 2014-12-30 MED ORDER — HYDROMORPHONE HCL 1 MG/ML IJ SOLN: 0.5000 mg | Freq: Once | INTRAMUSCULAR | Status: DC

## 2014-12-30 NOTE — Plan of Care (Signed)
Problem: Education: Goal: Knowledge of [facility] Education information/materials will improve Individualization of Care  Address as William Byrd. Lives at home with wife. Hx of dementia, DM, HTN, CKD, PVD, arthritis. Pt is medically managed.  Outcome: Progressing Pt alert to self only. No evidence of learning.  Problem: Safety: Goal: Ability to remain free from injury will improve Outcome: Progressing High fall risk precautions maintained. Pt remained free of injury during the shift.  Problem: Pain Managment: Goal: General experience of comfort will improve Outcome: Progressing No signs of pain nor discomfort.  Problem: Skin Integrity: Goal: Risk for impaired skin integrity will decrease Outcome: Progressing Dressing to L heel dry and intact. Heels elevated. Pt encouraged to eat and drink fluids. VSS. No c/o sob. Pt on room air with O2 sats in the high 90's. IVF d/c. Creatinine and BUN improving. PT recomands short term rehab.

## 2014-12-30 NOTE — Progress Notes (Signed)
[facility] [place] [facility]   PATIENT NAME: William DienesJames Byrd    MR#:  [id number]  DATE OF BIRTH:  05/20/1931  SUBJECTIVE:  CHIEF COMPLAINT:   Chief Complaint  Patient presents with  . Weakness  . Urinary Tract Infection   Patient here due to weakness and also noted to be in acute on chronic renal failure. Wife at bedside. X-ray of lower back yesterday showing some DJD.  Renal function improving. No other complaints.   REVIEW OF SYSTEMS:    Review of Systems  Unable to perform ROS: dementia    Nutrition: Heart Healthy Tolerating Diet: yes Tolerating PT: Await Evaluation.    DRUG ALLERGIES:   Allergies  Allergen Reactions  . Fosinopril     Other reaction(s): Other (See Comments) Hyperkalemia     VITALS:  Blood pressure 157/60, pulse 59, temperature 97.3 F (36.3 C), temperature source Oral, resp. rate 18, height 5\' 8"  (1.727 m), weight 81.194 kg (179 lb), SpO2 100 %.  PHYSICAL EXAMINATION:   Physical Exam  GENERAL:  81 y.o.-year-old patient lying in the bed with no acute distress.  EYES: Pupils equal, round, reactive to light and accommodation. No scleral icterus. Extraocular muscles intact.  HEENT: Head atraumatic, normocephalic. Oropharynx and nasopharynx clear.  NECK:  Supple, no jugular venous distention. No thyroid enlargement, no tenderness.  LUNGS: Good A/E b/l, no wheezing, rhonchi, bibasilar rales. No use of accessory muscles of respiration.  CARDIOVASCULAR: S1, S2 normal. No murmurs, rubs, or gallops.  ABDOMEN: Soft, nontender, nondistended. Bowel sounds present. No organomegaly or mass.  EXTREMITIES: No cyanosis, clubbing or edema b/l.    NEUROLOGIC: Cranial nerves II through XII are intact. No focal Motor or sensory deficits b/l. Globally weak.   PSYCHIATRIC: The patient is alert and oriented x 1.  SKIN: No obvious rash, lesion, or ulcer.    LABORATORY PANEL:   CBC  Recent Labs Lab 12/29/14 0605  WBC 3.6*  HGB  9.7*  HCT 28.6*  PLT 222   ------------------------------------------------------------------------------------------------------------------  Chemistries   Recent Labs Lab 12/28/14 1042  12/30/14 0531  NA 136  < > 140  K 4.1  < > 3.4*  CL 98*  < > 109  CO2 27  < > 27  GLUCOSE 173*  < > 106*  BUN 44*  < > 28*  CREATININE 3.10*  < > 1.63*  CALCIUM 9.5  < > 8.2*  AST 41  --   --   ALT 18  --   --   ALKPHOS 92  --   --   BILITOT 0.5  --   --   < > = values in this interval not displayed. ------------------------------------------------------------------------------------------------------------------  Cardiac Enzymes  Recent Labs Lab 12/28/14 1042  TROPONINI <0.03   ------------------------------------------------------------------------------------------------------------------  RADIOLOGY:  Dg Lumbar Spine 2-3 Views  12/29/2014  CLINICAL DATA:  Dementia, no reported lumbar symptoms EXAM: LUMBAR SPINE - 2-3 VIEW COMPARISON:  Abdominal series of September 24, 2013. FINDINGS: There is mild loss of height of the bodies of L4 and L5 as compared to L1 through L3. This is likely chronic. There is mild disc space narrowing at L4-5 and at L2-3 and L1-2. There are anterior endplate osteophytes at all lumbar levels and in the lower thoracic spine. There is no spondylolisthesis. There is facet joint hypertrophy at L4-5 and at L5-S1. The observed portions of the sacrum are normal. The pedicles and transverse processes are intact. There are multiple coarse calcifications within the spleen  which are stable. IMPRESSION: Lumbar spondylosis at multiple levels with mild degenerative disc space narrowing as described. There is mild loss of height of the bodies of L4 and L5 which appears chronic. No acute lumbar spine abnormality is demonstrated. Electronically Signed   By: David  [place] M.D.   On: 12/29/2014 16:01   Ct Head Wo Contrast  12/28/2014  CLINICAL DATA:  Generalized weakness, history of  dementia EXAM: CT HEAD WITHOUT CONTRAST TECHNIQUE: Contiguous axial images were obtained from the base of the skull through the vertex without intravenous contrast. COMPARISON:  09/04/2014 FINDINGS: No skull fracture is noted. Paranasal sinuses and mastoid air cells are unremarkable. No intracranial hemorrhage, mass effect or midline shift. Stable atrophy and chronic white matter disease. Ventricular size is stable from prior exam. No definite acute cortical infarction. No mass lesion is noted on this unenhanced scan. IMPRESSION: 1. No acute intracranial abnormality. Stable atrophy and chronic white matter disease. Electronically Signed   By: Natasha Mead M.D.   On: 12/28/2014 15:31   Ct Chest Wo Contrast  12/28/2014  CLINICAL DATA:  81 year old complains of weakness.  Pneumonia. EXAM: CT CHEST WITHOUT CONTRAST TECHNIQUE: Multidetector CT imaging of the chest was performed following the standard protocol without IV contrast. COMPARISON:  Chest radiograph 12/28/2014 FINDINGS: No significant chest lymphadenopathy. Large calcification in the subcarinal region. There is no significant pericardial or pleural fluid. There are coronary artery calcifications. Innumerable calcifications throughout the spleen. 4 mm stone in the left kidney lower pole without hydronephrosis. Punctate stone in the right kidney lower pole without hydronephrosis. Multiple calcifications in the liver. Atherosclerotic calcifications in the aorta without aneurysm. Trachea and mainstem bronchi are patent. Filling defects throughout the distal bronchus intermedius and within the right lower lobe airway branches. There is narrowing at the origin of the right middle lobe bronchus. Volume loss in the right middle lobe. 3 mm pleural-based nodular density in the right upper lobe on sequence 3, image 12. Patchy parenchymal densities in the right upper lobe. Patchy parenchymal densities in right lower lobe. No significant airspace disease or consolidation  in the left lung. There is mild peribronchial thickening in the left lower lobe. No acute bone abnormality. Multilevel degenerative changes in the thoracic spine. IMPRESSION: Endobronchial material throughout the bronchus intermedius and extending into the right lower lobe airways. Findings are suggestive for mucous plugging or aspiration. Volume loss in the right middle lobe. Endobronchial lesion or lesions cannot be excluded, particularly near the origin of the right middle lobe and right lower lobe bronchi. Patchy parenchymal densities in the right lung are probably related to a combination of volume loss and an infectious/inflammatory process. Indeterminate 3 mm pleural-based nodule in the right upper lung. If the patient is at high risk for bronchogenic carcinoma, follow-up chest CT at 1 year is recommended. If the patient is at low risk, no follow-up is needed. This recommendation follows the consensus statement: Guidelines for Management of Small Pulmonary Nodules Detected on CT Scans: A Statement from the Fleischner Society as published in Radiology 2005; 237:395-400. Evidence for old granulomatous disease. Bilateral nonobstructive renal calculi. Electronically Signed   By: Richarda Overlie M.D.   On: 12/28/2014 17:08     ASSESSMENT AND PLAN:   81 year old male with past medical history of dementia, BPH, chronic renal failure, diabetes, peripheral vascular disease, osteoarthritis who presented to the hospital with generalized weakness and a cough.  #1 acute on chronic renal failure-this is likely secondary to dehydration and concomitant use of diuretics. -Patient  baseline creatinine is around 1.6-1.8. Improved with IV fluids and creatinine back to baseline. -Follow BUN/creatinine urine output.  #2 pneumonia/atelectasis-patient had a cough which was present on admission.  Clinically he does not show any evidence of pneumonia. He has no fever, elevated white cell count. -CT scan of the chest showing  debris within the endobronchial tree, questionable aspiration. Appreciate speech evaluation and no risk for aspiration as per them. -Clinically doing well. Afebrile.  #3 hypertension-continue Norvasc  #4 history of DVT-patient's DVT was over a year ago. - off Xarelto  #5 history of BPH-no evidence of urinary retention. Continue finasteride  #6 history of dementia-continue Namenda, Aricept.  #7 diabetes type 2 without complication-blood sugars are currently stable. Cont. To Hold Tradjenta, Lantus. -Continue sliding scale insulin  #8 diabetic neuropathy-continue Lyrica.  #9 hyperlipidemia-continue Zetia  #10 glaucoma-continue timolol.    Await physical therapy evaluation and may need SNF.    All the records are reviewed and case discussed with Care Management/Social Workerr. Management plans discussed with the patient, family and they are in agreement.  CODE STATUS: Full  DVT Prophylaxis: Heparin subcutaneous  TOTAL TIME TAKING CARE OF THIS PATIENT: 25 minutes.   POSSIBLE D/C IN 1-2 DAYS, DEPENDING ON CLINICAL CONDITION.   Houston Siren M.D on 12/30/2014 at 2:02 PM  Between 7am to 6pm - Pager - [phone]  After 6pm go to [email or web address] - password [email or web address]  [facility]  Office  [phone]  CC: Primary care physician; Marisue Ivan, MD

## 2014-12-30 NOTE — Plan of Care (Signed)
Problem: Skin Integrity: Goal: Risk for impaired skin integrity will decrease Outcome: Progressing Plan of Care Progress to Goal:   Pt has been resting comfortably during shift. Pt denies pain. Pt still has fluids going. No other signs of distress noted. Will continue to monitor.

## 2014-12-30 NOTE — Evaluation (Signed)
Clinical/Bedside Swallow Evaluation Patient Details  Name: William Byrd MRN: [id number] Date of Birth: [birth year]  Today's Date: 12/30/2014 Time: SLP Start Time (ACUTE ONLY): 0810 SLP Stop Time (ACUTE ONLY): 0910 SLP Time Calculation (min) (ACUTE ONLY): 60 min  Past Medical History:  Past Medical History  Diagnosis Date  . Dementia   . Diabetes mellitus without complication (HCC)   . Hypertension   . Arthritis   . Chronic kidney disease   . Peripheral vascular disease (HCC)   . Generalized weakness    Past Surgical History:  Past Surgical History  Procedure Laterality Date  . C-spine fusion N/A    HPI:  Pt is a 81 y.o. male with a known history of dementia, diabetes, hypertension, arthritis, chronic kidney disease, peripheral vascular disease, generalized weakness- lives at home with wife and for last many months he is using wheelchair throughout the day but usually he is able to get up from the wheelchair and go to the bed on his own. For last few days wife notices that he has been coughing a little more than usual, and also more weak. He could not transfer himself from wheelchair to bed last night. His urine for habits is urinary incontinence- and he wets his clothes and bed almost every night. Pt has been tolerateing his diet while admitted per NSG. Pt does have a baseline dx of Dementia on meds for such. He is able to communicate his wants and needs; oriented to self and that he is in the hospital. He followed instructions given cues; feeds self w/ setup assist d/t LUE weakness. Suspect pt may be at his baseline re: Cognitive status presentation but will attempt to f/u w/ wife.    Assessment / Plan / Recommendation Clinical Impression  Pt appeared to safely tolerate trials of thin liquids and soft solids w/ no overt s/s of aspiration noted during trials/intake; no apparent oropharyngeal phase dsypahgia apparent. Pt was able to feed self w/ setup assist d/t LUE weakness. Pt does  have Cognitive deficits sec. to Dementia baseline. Pt appears at reduced risk for aspiration following general aspiration precautions (sec. to Cognitive deficits). Rec. a mech soft diet w/ thin liquids; general aspiration precautions; reduce distractions during meals; setup assist. No further skilled ST services indicated at this time; NSG to reconsult ST services if any change in status.     Aspiration Risk   (reduced )    Diet Recommendation  Mech soft diet; thin liquids (LUE weakness for cutting foods); general aspiration precautions sec. to Dementia baseline  Medication Administration: Whole meds with liquid (as tolerates or w/ puree)    Other  Recommendations Oral Care Recommendations: Oral care BID;Staff/trained caregiver to provide oral care Other Recommendations:  (n/a)   Follow up Recommendations   (Cognitive decline/Dementia baseline)    Frequency and Duration            Swallow Study   General Date of Onset: 12/28/14 HPI: Pt is a 81 y.o. male with a known history of dementia, diabetes, hypertension, arthritis, chronic kidney disease, peripheral vascular disease, generalized weakness- lives at home with wife and for last many months he is using wheelchair throughout the day but usually he is able to get up from the wheelchair and go to the bed on his own. For last few days wife notices that he has been coughing a little more than usual, and also more weak. He could not transfer himself from wheelchair to bed last night. His urine for habits  is urinary incontinence- and he wets his clothes and bed almost every night. Pt has been tolerateing his diet while admitted per NSG. Pt does have a baseline dx of Dementia on meds for such. He is able to communicate his wants and needs; oriented to self and that he is in the hospital. He followed instructions given cues; feeds self w/ setup assist d/t LUE weakness. Suspect pt may be at his baseline re: Cognitive status presentation but will attempt  to f/u w/ wife.  Type of Study: Bedside Swallow Evaluation Previous Swallow Assessment: none indicated Diet Prior to this Study: Regular;Thin liquids (per pt) Temperature Spikes Noted: No (wbc 6.4; 3.6) Respiratory Status: Room air History of Recent Intubation: No Behavior/Cognition: Alert;Cooperative;Pleasant mood;Distractible;Requires cueing Oral Cavity Assessment: Within Functional Limits Oral Care Completed by SLP: Yes Oral Cavity - Dentition: Adequate natural dentition;Missing dentition Vision: Functional for self-feeding Self-Feeding Abilities: Able to feed self;Needs assist;Needs set up (LUE weakness) Patient Positioning: Upright in bed Baseline Vocal Quality: Normal;Low vocal intensity Volitional Cough: Strong Volitional Swallow: Able to elicit    Oral/Motor/Sensory Function Overall Oral Motor/Sensory Function: Within functional limits   Ice Chips Ice chips: Within functional limits Presentation: Spoon (fed; 3 trials)   Thin Liquid Thin Liquid: Within functional limits Presentation: Cup;Self Fed;Straw (~6 ozs)    Nectar Thick Nectar Thick Liquid: Not tested   Honey Thick Honey Thick Liquid: Not tested   Puree Puree: Not tested   Solid Solid: Within functional limits Presentation: Self Fed;Spoon (~75% of his breakfast tray of eggs, potatoes, french toast)        Jerilynn SomKatherine Watson, MS, CCC-SLP  Watson,Katherine 12/30/2014,9:16 AM

## 2014-12-31 LAB — GLUCOSE, CAPILLARY
GLUCOSE-CAPILLARY: 146 mg/dL — AB (ref 65–99)
GLUCOSE-CAPILLARY: 153 mg/dL — AB (ref 65–99)

## 2014-12-31 MED ORDER — PREGABALIN 75 MG PO CAPS: 75.0000 mg | ORAL_CAPSULE | Freq: Two times a day (BID) | ORAL | Status: DC

## 2014-12-31 NOTE — Plan of Care (Signed)
Problem: Skin Integrity: Goal: Risk for impaired skin integrity will decrease Outcome: Progressing Pt denies pain and has been resting comfortably during shift. No other signs of distress noted. Will continue to monitor.

## 2014-12-31 NOTE — Discharge Summary (Signed)
[facility] - [place] at [facility]   PATIENT NAME: William Byrd    MR#:  [id number]  DATE OF BIRTH:  10-Jan-1932  DATE OF ADMISSION:  12/28/2014 ADMITTING PHYSICIAN: Altamese Dilling, MD  DATE OF DISCHARGE: 12/31/2014  PRIMARY CARE PHYSICIAN: Marisue Ivan, MD    ADMISSION DIAGNOSIS:  Dehydration [E86.0] Pneumonia [J18.9] Acute renal injury (HCC) [N17.9]  DISCHARGE DIAGNOSIS:  Principal Problem:   Acute on chronic renal failure (HCC) Active Problems:   Dehydration   Generalized weakness   Acute renal failure (ARF) (HCC)   SECONDARY DIAGNOSIS:   Past Medical History  Diagnosis Date  . Dementia   . Diabetes mellitus without complication (HCC)   . Hypertension   . Arthritis   . Chronic kidney disease   . Peripheral vascular disease (HCC)   . Generalized weakness     HOSPITAL COURSE:   81 year old male with past medical history of dementia, BPH, chronic renal failure, diabetes, peripheral vascular disease, osteoarthritis who presented to the hospital with generalized weakness and a cough.  #1 acute on chronic renal failure-this was likely secondary to dehydration and concomitant use of diuretics. -Patient baseline creatinine is around 1.6-1.8. Cr. On admission was as high as 3.1.  Pt. Was given IV fluids and his BUN/Cr has come back to baseline.  His diuretics were held but they are being resumed upon discharge today.  - We'll follow renal function in a week.  #2 pneumonia/atelectasis-patient had a cough which was present on admission.  -CT scan of the chest showing debris within the endobronchial tree, questionable aspiration. Obtained a speech evaluation but they did not suspect any evidence of aspiration. -Patient was clinically afebrile, hemodynamically stable, with a normal white cell count. Pneumonia therefore has been ruled out and therefore he was not given any antibiotics. Patient would benefit from getting a repeat CT scan  of his chest in the next 3-6 months which can be done through his primary care physician as an outpatient.  #3 hypertension-patient remained hemodynamically stable and he will continue his Norvasc.  #4 history of DVT-patient's DVT was over a year ago. - He has been taken off Xarelto  #5 history of BPH-no evidence of urinary retention. Patient will Continue finasteride  #6 history of dementia-patient will continue his Namenda, Aricept.  #7 diabetes type 2 without complication- patient's blood sugars remained stable while in the hospital.  -Patient will resume his Lantus, lispro with meals and also Tradjenta upon discharge.  #8 diabetic neuropathy-patient will continue Lyrica.  #9 hyperlipidemia-patient will continue Zetia  #10 glaucoma-patient will continue timolol.   Patient was seen by physical therapy and they thought he would benefit from short-term rehabilitation and therefore is being discharged to presently.  DISCHARGE CONDITIONS:   Stable  CONSULTS OBTAINED:     DRUG ALLERGIES:   Allergies  Allergen Reactions  . Fosinopril     Other reaction(s): Other (See Comments) Hyperkalemia     DISCHARGE MEDICATIONS:   Current Discharge Medication List    CONTINUE these medications which have NOT CHANGED   Details  acetaminophen (TYLENOL) 325 MG tablet Take 650 mg by mouth every 6 (six) hours as needed for mild pain, moderate pain, fever or headache.    amLODipine (NORVASC) 10 MG tablet Take 10 mg by mouth daily.    benzonatate (TESSALON) 200 MG capsule Take 200 mg by mouth 3 (three) times daily as needed for cough.    cholecalciferol (VITAMIN D) 1000 UNITS tablet Take 1,000 Units by mouth  daily.    docusate sodium (STOOL SOFTENER) 100 MG capsule Take 100 mg by mouth daily as needed for mild constipation or moderate constipation.     donepezil (ARICEPT) 10 MG tablet Take 10 mg by mouth at bedtime.    DULoxetine (CYMBALTA) 60 MG capsule Take 60 mg by mouth daily.     ezetimibe (ZETIA) 10 MG tablet Take 1 tablet (10 mg total) by mouth daily.    finasteride (PROSCAR) 5 MG tablet Take 5 mg by mouth daily.    furosemide (LASIX) 20 MG tablet Take 20 mg by mouth daily.    insulin glargine (LANTUS) 100 UNIT/ML injection Inject 20 Units into the skin at bedtime.     insulin lispro (HUMALOG) 100 UNIT/ML injection Inject 10-15 Units into the skin See admin instructions. 10 units before breakfast and before lunch, and 15 units before dinner    ketorolac (ACULAR) 0.4 % SOLN Place 1 drop into both eyes 2 (two) times daily.    linagliptin (TRADJENTA) 5 MG TABS tablet Take 5 mg by mouth daily.    memantine (NAMENDA XR) 28 MG CP24 24 hr capsule Take 28 mg by mouth daily.    pregabalin (LYRICA) 75 MG capsule Take 75 mg by mouth 2 (two) times daily.    rivaroxaban (XARELTO) 20 MG TABS tablet Take 1 tablet by mouth daily.    Saw Palmetto-Phytosterols (PROSTATE SR) 160-250 MG CAPS Take 1 capsule by mouth daily.    timolol (TIMOPTIC) 0.5 % ophthalmic solution Place 1 drop into both eyes 2 (two) times daily.    metoprolol tartrate (LOPRESSOR) 25 MG tablet Take 1 tablet (25 mg total) by mouth 2 (two) times daily.      STOP taking these medications     torsemide (DEMADEX) 20 MG tablet          DISCHARGE INSTRUCTIONS:   DIET:  Cardiac diet and Diabetic diet  DISCHARGE CONDITION:  Stable  ACTIVITY:  Activity as tolerated  OXYGEN:  Home Oxygen: No.   Oxygen Delivery: room air  DISCHARGE LOCATION:  nursing home   If you experience worsening of your admission symptoms, develop shortness of breath, life threatening emergency, suicidal or homicidal thoughts you must seek medical attention immediately by calling 911 or calling your MD immediately  if symptoms less severe.  You Must read complete instructions/literature along with all the possible adverse reactions/side effects for all the Medicines you take and that have been prescribed to you. Take any  new Medicines after you have completely understood and accpet all the possible adverse reactions/side effects.   Please note  You were cared for by a hospitalist during your hospital stay. If you have any questions about your discharge medications or the care you received while you were in the hospital after you are discharged, you can call the unit and asked to speak with the hospitalist on call if the hospitalist that took care of you is not available. Once you are discharged, your primary care physician will handle any further medical issues. Please note that NO REFILLS for any discharge medications will be authorized once you are discharged, as it is imperative that you return to your primary care physician (or establish a relationship with a primary care physician if you do not have one) for your aftercare needs so that they can reassess your need for medications and monitor your lab values.     Today   No acute events overnight. Afebrile, hemodynamically stable. Renal function at baseline.  VITAL  SIGNS:  Blood pressure 148/59, pulse 60, temperature 97.5 F (36.4 C), temperature source Oral, resp. rate 18, height  (1.727 m), weight 81.194 kg (179 lb), SpO2 95 %.  I/O:   Intake/Output Summary (Last 24 hours) at 12/31/14 0913 Last data filed at 12/30/14 1410  Gross per 24 hour  Intake   1090 ml  Output      0 ml  Net   1090 ml    PHYSICAL EXAMINATION:   GENERAL: 81 y.o.-year-old patient lying in the bed with no acute distress.  EYES: Pupils equal, round, reactive to light and accommodation. No scleral icterus. Extraocular muscles intact.  HEENT: Head atraumatic, normocephalic. Oropharynx and nasopharynx clear.  NECK: Supple, no jugular venous distention. No thyroid enlargement, no tenderness.  LUNGS: Good A/E b/l, no wheezing, rhonchi, bibasilar rales. No use of accessory muscles of respiration.  CARDIOVASCULAR: S1, S2 normal. No murmurs, rubs, or gallops.  ABDOMEN:  Soft, nontender, nondistended. Bowel sounds present. No organomegaly or mass.  EXTREMITIES: No cyanosis, clubbing or edema b/l.  NEUROLOGIC: Cranial nerves II through XII are intact. No focal Motor or sensory deficits b/l. Globally weak.  PSYCHIATRIC: The patient is alert and oriented x 1.  SKIN: No obvious rash, lesion, or ulcer.   DATA REVIEW:   CBC  Recent Labs Lab 12/29/14 0605  WBC 3.6*  HGB 9.7*  HCT 28.6*  PLT 222    Chemistries   Recent Labs Lab 12/28/14 1042  12/30/14 0531  NA 136  < > 140  K 4.1  < > 3.4*  CL 98*  < > 109  CO2 27  < > 27  GLUCOSE 173*  < > 106*  BUN 44*  < > 28*  CREATININE 3.10*  < > 1.63*  CALCIUM 9.5  < > 8.2*  AST 41  --   --   ALT 18  --   --   ALKPHOS 92  --   --   BILITOT 0.5  --   --   < > = values in this interval not displayed.  Cardiac Enzymes  Recent Labs Lab 12/28/14 1042  TROPONINI <0.03    Microbiology Results  Results for orders placed or performed during the hospital encounter of 12/28/14  Urine culture     Status: None   Collection Time: 12/28/14 12:21 PM  Result Value Ref Range Status   Specimen Description URINE, RANDOM  Final   Special Requests NONE  Final   Culture NO GROWTH 1 DAY  Final   Report Status 12/30/2014 FINAL  Final    RADIOLOGY:  Dg Lumbar Spine 2-3 Views  12/29/2014  CLINICAL DATA:  Dementia, no reported lumbar symptoms EXAM: LUMBAR SPINE - 2-3 VIEW COMPARISON:  Abdominal series of September 24, 2013. FINDINGS: There is mild loss of height of the bodies of L4 and L5 as compared to L1 through L3. This is likely chronic. There is mild disc space narrowing at L4-5 and at L2-3 and L1-2. There are anterior endplate osteophytes at all lumbar levels and in the lower thoracic spine. There is no spondylolisthesis. There is facet joint hypertrophy at L4-5 and at L5-S1. The observed portions of the sacrum are normal. The pedicles and transverse processes are intact. There are multiple coarse  calcifications within the spleen which are stable. IMPRESSION: Lumbar spondylosis at multiple levels with mild degenerative disc space narrowing as described. There is mild loss of height of the bodies of L4 and L5 which appears chronic. No acute  lumbar spine abnormality is demonstrated. Electronically Signed   By: David  [place] M.D.   On: 12/29/2014 16:01      Management plans discussed with the patient, family and they are in agreement.  CODE STATUS:     Code Status Orders        Start     Ordered   12/28/14 2151  Full code   Continuous     12/28/14 2150      TOTAL TIME TAKING CARE OF THIS PATIENT: 40 minutes.    Houston Siren M.D on 12/31/2014 at 9:13 AM  Between 7am to 6pm - Pager - [phone]  After 6pm go to [email or web address] - password [email or web address] [facility]  [facility]  Office  [phone]  CC: Primary care physician; Marisue Ivan, MD

## 2014-12-31 NOTE — Clinical Social Work Placement (Signed)
   CLINICAL SOCIAL WORK PLACEMENT  NOTE  Date:  12/31/2014  Patient Details  Name: William Byrd MRN: [id number] Date of Birth: 02/08/1932  Clinical Social Work is seeking post-discharge placement for this patient at the Skilled  Nursing Facility level of care (*CSW will initial, date and re-position this form in  chart as items are completed):  Yes   Patient/family provided with [place] Clinical Social Work Department's list of facilities offering this level of care within the geographic area requested by the patient (or if unable, by the patient's family).  Yes   Patient/family informed of their freedom to choose among providers that offer the needed level of care, that participate in Medicare, Medicaid or managed care program needed by the patient, have an available bed and are willing to accept the patient.  Yes   Patient/family informed of [place]'s ownership interest in [facility] and [facility], as well as of the fact that they are under no obligation to receive care at these facilities.  PASRR submitted to EDS on       PASRR number received on       Existing PASRR number confirmed on 12/29/14     FL2 transmitted to all facilities in geographic area requested by pt/family on 12/29/14     FL2 transmitted to all facilities within larger geographic area on       Patient informed that his/her managed care company has contracts with or will negotiate with certain facilities, including the following:        Yes   Patient/family informed of bed offers received.  Patient chooses bed at  [facility])     Physician recommends and patient chooses bed at      Patient to be transferred to  [facility]) on 12/31/14.  Patient to be transferred to facility by  (EMS)     Patient family notified on 12/31/14 of transfer.  Name of family member notified:   (Wife , [name])     PHYSICIAN       Additional Comment:     _______________________________________________ Soundra PilonMoore, Haylo Fake H, LCSW 12/31/2014, 11:05 AM

## 2014-12-31 NOTE — Progress Notes (Signed)
Clinical Social Worker informed byVivek Areatha KeasJ Sainan,  MD that patient is medically ready to discharge to SNF, Patient and Wife are in a agreement with plan.  Call to [facility] to confirm that patient's bed is ready. Provided patient's room number 90-A and number to call for report [phone] . All discharge information faxed to Facility..   RN will call report and patient will discharge to [facility] via EMS.  Sammuel Hineseborah Cicily Bonano. Theresia MajorsLCSWA, MSW Clinical Social Work Department [phone] 11:18 AM

## 2014-12-31 NOTE — Progress Notes (Signed)
Late entry: CSW provided patient and wife two bed offers [facility] on 12/30/14 .  She has not made a decision at this time.  [facility].   Sammuel Hineseborah Donterrius Santucci. LCSWA, MSW Clinical Social Work Department [phone] 8:30 AM

## 2014-12-31 NOTE — Progress Notes (Signed)
Patient is to be discharged to [facility] today. Patient is in no acute distress at this time, and assessment is unchanged from this morning. Patient's IV is out, discharge paperwork has been discussed with patient/family and there are no questions or concerns at this time. Report has been called to receiving RN and EMS has been called for transport. Patient will be accompanied downstairs by staff and family via wheelchair.

## 2015-07-15 ENCOUNTER — Inpatient Hospital Stay: Payer: Medicare Other

## 2015-07-15 ENCOUNTER — Emergency Department: Payer: Medicare Other

## 2015-07-15 ENCOUNTER — Inpatient Hospital Stay
Admission: EM | Admit: 2015-07-15 | Discharge: 2015-07-18 | DRG: 177 | Disposition: A | Discharge status: Skilled Nursing Facility | Payer: Medicare Other | Attending: Internal Medicine | Admitting: Internal Medicine

## 2015-07-15 ENCOUNTER — Encounter: Payer: Self-pay | Admitting: Emergency Medicine

## 2015-07-15 DIAGNOSIS — Z833 Family history of diabetes mellitus: Secondary | ICD-10-CM

## 2015-07-15 DIAGNOSIS — Z888 Allergy status to other drugs, medicaments and biological substances status: Secondary | ICD-10-CM | POA: Diagnosis not present

## 2015-07-15 DIAGNOSIS — J690 Pneumonitis due to inhalation of food and vomit: Principal | ICD-10-CM | POA: Diagnosis present

## 2015-07-15 DIAGNOSIS — R410 Disorientation, unspecified: Secondary | ICD-10-CM

## 2015-07-15 DIAGNOSIS — Z86718 Personal history of other venous thrombosis and embolism: Secondary | ICD-10-CM | POA: Diagnosis not present

## 2015-07-15 DIAGNOSIS — J189 Pneumonia, unspecified organism: Secondary | ICD-10-CM

## 2015-07-15 DIAGNOSIS — R29810 Facial weakness: Secondary | ICD-10-CM | POA: Diagnosis present

## 2015-07-15 DIAGNOSIS — R350 Frequency of micturition: Secondary | ICD-10-CM | POA: Diagnosis present

## 2015-07-15 DIAGNOSIS — Z794 Long term (current) use of insulin: Secondary | ICD-10-CM | POA: Diagnosis not present

## 2015-07-15 DIAGNOSIS — Z8673 Personal history of transient ischemic attack (TIA), and cerebral infarction without residual deficits: Secondary | ICD-10-CM

## 2015-07-15 DIAGNOSIS — E11319 Type 2 diabetes mellitus with unspecified diabetic retinopathy without macular edema: Secondary | ICD-10-CM | POA: Diagnosis present

## 2015-07-15 DIAGNOSIS — F0390 Unspecified dementia without behavioral disturbance: Secondary | ICD-10-CM | POA: Diagnosis present

## 2015-07-15 DIAGNOSIS — N3000 Acute cystitis without hematuria: Secondary | ICD-10-CM | POA: Diagnosis present

## 2015-07-15 DIAGNOSIS — Z79899 Other long term (current) drug therapy: Secondary | ICD-10-CM | POA: Diagnosis not present

## 2015-07-15 DIAGNOSIS — E1122 Type 2 diabetes mellitus with diabetic chronic kidney disease: Secondary | ICD-10-CM | POA: Diagnosis present

## 2015-07-15 DIAGNOSIS — G629 Polyneuropathy, unspecified: Secondary | ICD-10-CM | POA: Diagnosis present

## 2015-07-15 DIAGNOSIS — N183 Chronic kidney disease, stage 3 (moderate): Secondary | ICD-10-CM | POA: Diagnosis present

## 2015-07-15 DIAGNOSIS — I5042 Chronic combined systolic (congestive) and diastolic (congestive) heart failure: Secondary | ICD-10-CM | POA: Diagnosis present

## 2015-07-15 DIAGNOSIS — H409 Unspecified glaucoma: Secondary | ICD-10-CM | POA: Diagnosis present

## 2015-07-15 DIAGNOSIS — Z7982 Long term (current) use of aspirin: Secondary | ICD-10-CM

## 2015-07-15 DIAGNOSIS — E785 Hyperlipidemia, unspecified: Secondary | ICD-10-CM | POA: Diagnosis present

## 2015-07-15 DIAGNOSIS — E7800 Pure hypercholesterolemia, unspecified: Secondary | ICD-10-CM | POA: Diagnosis present

## 2015-07-15 DIAGNOSIS — E1142 Type 2 diabetes mellitus with diabetic polyneuropathy: Secondary | ICD-10-CM | POA: Diagnosis present

## 2015-07-15 DIAGNOSIS — Z8261 Family history of arthritis: Secondary | ICD-10-CM | POA: Diagnosis not present

## 2015-07-15 DIAGNOSIS — Z87891 Personal history of nicotine dependence: Secondary | ICD-10-CM | POA: Diagnosis not present

## 2015-07-15 DIAGNOSIS — E1151 Type 2 diabetes mellitus with diabetic peripheral angiopathy without gangrene: Secondary | ICD-10-CM | POA: Diagnosis present

## 2015-07-15 DIAGNOSIS — M4800 Spinal stenosis, site unspecified: Secondary | ICD-10-CM | POA: Diagnosis present

## 2015-07-15 DIAGNOSIS — M5136 Other intervertebral disc degeneration, lumbar region: Secondary | ICD-10-CM | POA: Diagnosis present

## 2015-07-15 DIAGNOSIS — Z7901 Long term (current) use of anticoagulants: Secondary | ICD-10-CM | POA: Diagnosis not present

## 2015-07-15 DIAGNOSIS — I130 Hypertensive heart and chronic kidney disease with heart failure and stage 1 through stage 4 chronic kidney disease, or unspecified chronic kidney disease: Secondary | ICD-10-CM | POA: Diagnosis present

## 2015-07-15 DIAGNOSIS — I739 Peripheral vascular disease, unspecified: Secondary | ICD-10-CM | POA: Diagnosis present

## 2015-07-15 DIAGNOSIS — R509 Fever, unspecified: Secondary | ICD-10-CM

## 2015-07-15 DIAGNOSIS — J181 Lobar pneumonia, unspecified organism: Secondary | ICD-10-CM

## 2015-07-15 DIAGNOSIS — G9340 Encephalopathy, unspecified: Secondary | ICD-10-CM | POA: Diagnosis present

## 2015-07-15 DIAGNOSIS — I639 Cerebral infarction, unspecified: Secondary | ICD-10-CM | POA: Diagnosis present

## 2015-07-15 HISTORY — DX: Cervicalgia: M54.2

## 2015-07-15 HISTORY — DX: Unspecified glaucoma: H40.9

## 2015-07-15 HISTORY — DX: Polyneuropathy, unspecified: G62.9

## 2015-07-15 HISTORY — DX: Spinal stenosis, site unspecified: M48.00

## 2015-07-15 HISTORY — DX: Other intervertebral disc degeneration, lumbosacral region: M51.37

## 2015-07-15 HISTORY — DX: Pure hypercholesterolemia, unspecified: E78.00

## 2015-07-15 HISTORY — DX: Other intervertebral disc degeneration, lumbosacral region without mention of lumbar back pain or lower extremity pain: M51.379

## 2015-07-15 HISTORY — DX: Unspecified background retinopathy: H35.00

## 2015-07-15 HISTORY — DX: Long term (current) use of insulin: Z79.4

## 2015-07-15 HISTORY — DX: Chronic systolic (congestive) heart failure: I50.22

## 2015-07-15 HISTORY — DX: Acute embolism and thrombosis of unspecified deep veins of unspecified lower extremity: I82.409

## 2015-07-15 LAB — URINALYSIS COMPLETE WITH MICROSCOPIC (ARMC ONLY)
Bilirubin Urine: NEGATIVE
GLUCOSE, UA: NEGATIVE mg/dL
Ketones, ur: NEGATIVE mg/dL
NITRITE: POSITIVE — AB
PROTEIN: 100 mg/dL — AB
SQUAMOUS EPITHELIAL / LPF: NONE SEEN
Specific Gravity, Urine: 1.013 (ref 1.005–1.030)
pH: 6 (ref 5.0–8.0)

## 2015-07-15 LAB — GLUCOSE, CAPILLARY
GLUCOSE-CAPILLARY: 111 mg/dL — AB (ref 65–99)
Glucose-Capillary: 137 mg/dL — ABNORMAL HIGH (ref 65–99)
Glucose-Capillary: 109 mg/dL — ABNORMAL HIGH (ref 65–99)

## 2015-07-15 LAB — COMPREHENSIVE METABOLIC PANEL
ALK PHOS: 70 U/L (ref 38–126)
ALT: 12 U/L — AB (ref 17–63)
AST: 22 U/L (ref 15–41)
Albumin: 2.9 g/dL — ABNORMAL LOW (ref 3.5–5.0)
Anion gap: 7 (ref 5–15)
BILIRUBIN TOTAL: 0.6 mg/dL (ref 0.3–1.2)
BUN: 23 mg/dL — AB (ref 6–20)
CHLORIDE: 106 mmol/L (ref 101–111)
CO2: 25 mmol/L (ref 22–32)
CREATININE: 1.71 mg/dL — AB (ref 0.61–1.24)
Calcium: 8.8 mg/dL — ABNORMAL LOW (ref 8.9–10.3)
GFR calc Af Amer: 41 mL/min — ABNORMAL LOW (ref >60)
GFR, EST NON AFRICAN AMERICAN: 36 mL/min — AB (ref >60)
Glucose, Bld: 135 mg/dL — ABNORMAL HIGH (ref 65–99)
Potassium: 4.4 mmol/L (ref 3.5–5.1)
Sodium: 138 mmol/L (ref 135–145)
TOTAL PROTEIN: 7.6 g/dL (ref 6.5–8.1)

## 2015-07-15 LAB — CBC WITH DIFFERENTIAL/PLATELET
BASOS ABS: 0.2 10*3/uL — AB (ref 0–0.1)
Basophils Relative: 3 %
EOS ABS: 0 10*3/uL (ref 0–0.7)
Eosinophils Relative: 0 %
HCT: 30 % — ABNORMAL LOW (ref 40.0–52.0)
Hemoglobin: 10.4 g/dL — ABNORMAL LOW (ref 13.0–18.0)
Lymphocytes Relative: 7 %
Lymphs Abs: 0.6 10*3/uL — ABNORMAL LOW (ref 1.0–3.6)
MCH: 31.4 pg (ref 26.0–34.0)
MCHC: 34.7 g/dL (ref 32.0–36.0)
MCV: 90.5 fL (ref 80.0–100.0)
MONO ABS: 0.6 10*3/uL (ref 0.2–1.0)
NEUTROS ABS: 7.2 10*3/uL — AB (ref 1.4–6.5)
Neutrophils Relative %: 83 %
PLATELETS: 241 10*3/uL (ref 150–440)
RBC: 3.32 MIL/uL — AB (ref 4.40–5.90)
RDW: 14.9 % — AB (ref 11.5–14.5)
WBC: 8.6 10*3/uL (ref 3.8–10.6)

## 2015-07-15 LAB — LACTIC ACID, PLASMA
LACTIC ACID, VENOUS: 1.1 mmol/L (ref 0.5–2.0)
Lactic Acid, Venous: 1.4 mmol/L (ref 0.5–2.0)

## 2015-07-15 MED ORDER — INSULIN ASPART 100 UNIT/ML ~~LOC~~ SOLN
0.0000 [IU] | Freq: Three times a day (TID) | SUBCUTANEOUS | Status: DC
  Administered 2015-07-16 – 2015-07-17 (×2): 3 [IU] via SUBCUTANEOUS
  Filled 2015-07-15 (×2): qty 3

## 2015-07-15 MED ORDER — DEXTROSE 5 % IV SOLN
500.0000 mg | Freq: Once | INTRAVENOUS | Status: AC
  Administered 2015-07-15: 500 mg via INTRAVENOUS
  Filled 2015-07-15: qty 500

## 2015-07-15 MED ORDER — MEMANTINE HCL ER 14 MG PO CP24
28.0000 mg | ORAL_CAPSULE | Freq: Every day | ORAL | Status: DC
  Administered 2015-07-17 – 2015-07-18 (×2): 28 mg via ORAL
  Filled 2015-07-15 (×3): qty 2

## 2015-07-15 MED ORDER — VITAMIN D3 25 MCG (1000 UNIT) PO TABS
1000.0000 [IU] | ORAL_TABLET | Freq: Every day | ORAL | Status: DC
  Administered 2015-07-17 – 2015-07-18 (×2): 1000 [IU] via ORAL
  Filled 2015-07-15 (×3): qty 1

## 2015-07-15 MED ORDER — DOCUSATE SODIUM 100 MG PO CAPS
100.0000 mg | ORAL_CAPSULE | Freq: Every day | ORAL | Status: DC | PRN
  Filled 2015-07-15: qty 1

## 2015-07-15 MED ORDER — AMLODIPINE BESYLATE 5 MG PO TABS
5.0000 mg | ORAL_TABLET | Freq: Every day | ORAL | Status: DC
  Filled 2015-07-15 (×2): qty 1

## 2015-07-15 MED ORDER — ACETAMINOPHEN 325 MG PO TABS
ORAL_TABLET | ORAL | Status: AC
  Administered 2015-07-15: 650 mg via ORAL
  Filled 2015-07-15: qty 2

## 2015-07-15 MED ORDER — LEVOFLOXACIN IN D5W 500 MG/100ML IV SOLN: 500.0000 mg | INTRAVENOUS | Status: DC

## 2015-07-15 MED ORDER — DONEPEZIL HCL 5 MG PO TABS
10.0000 mg | ORAL_TABLET | Freq: Every day | ORAL | Status: DC
  Administered 2015-07-16 – 2015-07-17 (×2): 10 mg via ORAL
  Filled 2015-07-15 (×2): qty 1
  Filled 2015-07-15 (×2): qty 2

## 2015-07-15 MED ORDER — PROSTATE SR 160-250 MG PO CAPS: 1.0000 | ORAL_CAPSULE | Freq: Every day | ORAL | Status: DC

## 2015-07-15 MED ORDER — DEXTROSE 5 % IV SOLN
2.0000 g | Freq: Once | INTRAVENOUS | Status: AC
  Administered 2015-07-15: 2 g via INTRAVENOUS
  Filled 2015-07-15: qty 2

## 2015-07-15 MED ORDER — SODIUM CHLORIDE 0.9 % IV SOLN
INTRAVENOUS | Status: DC
  Administered 2015-07-15 – 2015-07-16 (×3): via INTRAVENOUS

## 2015-07-15 MED ORDER — SODIUM CHLORIDE 0.9% FLUSH
3.0000 mL | Freq: Two times a day (BID) | INTRAVENOUS | Status: DC
  Administered 2015-07-15 – 2015-07-18 (×5): 3 mL via INTRAVENOUS
  Filled 2015-07-15: qty 3

## 2015-07-15 MED ORDER — ACETAMINOPHEN 325 MG PO TABS
650.0000 mg | ORAL_TABLET | Freq: Once | ORAL | Status: AC
  Administered 2015-07-15: 650 mg via ORAL

## 2015-07-15 MED ORDER — TIMOLOL MALEATE 0.5 % OP SOLN
1.0000 [drp] | Freq: Two times a day (BID) | OPHTHALMIC | Status: DC
  Administered 2015-07-15 – 2015-07-18 (×5): 1 [drp] via OPHTHALMIC
  Filled 2015-07-15: qty 5

## 2015-07-15 MED ORDER — ONDANSETRON HCL 4 MG PO TABS
4.0000 mg | ORAL_TABLET | Freq: Four times a day (QID) | ORAL | Status: DC | PRN
  Filled 2015-07-15: qty 1

## 2015-07-15 MED ORDER — ASPIRIN 300 MG RE SUPP: 300.0000 mg | Freq: Every day | RECTAL | Status: DC

## 2015-07-15 MED ORDER — ONDANSETRON HCL 4 MG/2ML IJ SOLN
4.0000 mg | Freq: Four times a day (QID) | INTRAMUSCULAR | Status: DC | PRN
  Filled 2015-07-15: qty 2

## 2015-07-15 MED ORDER — ACETAMINOPHEN 650 MG RE SUPP
650.0000 mg | Freq: Four times a day (QID) | RECTAL | Status: DC | PRN
  Filled 2015-07-15: qty 1

## 2015-07-15 MED ORDER — FINASTERIDE 5 MG PO TABS
5.0000 mg | ORAL_TABLET | Freq: Every day | ORAL | Status: DC
  Administered 2015-07-17 – 2015-07-18 (×2): 5 mg via ORAL
  Filled 2015-07-15 (×3): qty 1

## 2015-07-15 MED ORDER — ACETAMINOPHEN 325 MG PO TABS: 650.0000 mg | ORAL_TABLET | Freq: Four times a day (QID) | ORAL | Status: DC | PRN

## 2015-07-15 MED ORDER — ENOXAPARIN SODIUM 40 MG/0.4ML ~~LOC~~ SOLN: 40.0000 mg | SUBCUTANEOUS | Status: DC

## 2015-07-15 MED ORDER — DULOXETINE HCL 60 MG PO CPEP
60.0000 mg | ORAL_CAPSULE | Freq: Every day | ORAL | Status: DC
  Administered 2015-07-17 – 2015-07-18 (×2): 60 mg via ORAL
  Filled 2015-07-15 (×3): qty 1

## 2015-07-15 MED ORDER — ATORVASTATIN CALCIUM 20 MG PO TABS
20.0000 mg | ORAL_TABLET | Freq: Every day | ORAL | Status: DC
  Administered 2015-07-17 – 2015-07-18 (×2): 20 mg via ORAL
  Filled 2015-07-15 (×3): qty 1

## 2015-07-15 MED ORDER — SODIUM CHLORIDE 0.9 % IV BOLUS (SEPSIS)
1000.0000 mL | Freq: Once | INTRAVENOUS | Status: AC
  Administered 2015-07-15: 1000 mL via INTRAVENOUS

## 2015-07-15 MED ORDER — DEXTROSE 5 % IV SOLN: 500.0000 mg | INTRAVENOUS | Status: DC

## 2015-07-15 MED ORDER — DEXTROSE 5 % IV SOLN: 1.0000 g | INTRAVENOUS | Status: DC

## 2015-07-15 MED ORDER — ACETAMINOPHEN 325 MG PO TABS
650.0000 mg | ORAL_TABLET | Freq: Four times a day (QID) | ORAL | Status: DC | PRN
  Filled 2015-07-15: qty 2

## 2015-07-15 MED ORDER — METOPROLOL TARTRATE 25 MG PO TABS
25.0000 mg | ORAL_TABLET | Freq: Two times a day (BID) | ORAL | Status: DC
  Administered 2015-07-16 – 2015-07-18 (×4): 25 mg via ORAL
  Filled 2015-07-15 (×5): qty 1

## 2015-07-15 MED ORDER — RIVAROXABAN 20 MG PO TABS
20.0000 mg | ORAL_TABLET | Freq: Every day | ORAL | Status: DC
  Administered 2015-07-16 – 2015-07-17 (×2): 20 mg via ORAL
  Filled 2015-07-15 (×4): qty 1

## 2015-07-15 MED ORDER — LEVOFLOXACIN IN D5W 250 MG/50ML IV SOLN
250.0000 mg | INTRAVENOUS | Status: DC
  Administered 2015-07-17 – 2015-07-18 (×2): 250 mg via INTRAVENOUS
  Filled 2015-07-15 (×3): qty 50

## 2015-07-15 MED ORDER — LEVOFLOXACIN IN D5W 500 MG/100ML IV SOLN
500.0000 mg | Freq: Once | INTRAVENOUS | Status: AC
  Administered 2015-07-16: 500 mg via INTRAVENOUS
  Filled 2015-07-15: qty 100

## 2015-07-15 MED ORDER — BENZONATATE 100 MG PO CAPS
200.0000 mg | ORAL_CAPSULE | Freq: Three times a day (TID) | ORAL | Status: DC | PRN
  Filled 2015-07-15: qty 2

## 2015-07-15 MED ORDER — PREGABALIN 75 MG PO CAPS
75.0000 mg | ORAL_CAPSULE | Freq: Two times a day (BID) | ORAL | Status: DC
  Administered 2015-07-16 – 2015-07-18 (×4): 75 mg via ORAL
  Filled 2015-07-15 (×5): qty 1

## 2015-07-15 NOTE — Evaluation (Signed)
Physical Therapy Evaluation Patient Details Name: William Byrd MRN: [id number] DOB: November 05, 1931 Today's Date: 07/15/2015   History of Present Illness  82 y.o. male with a known history of Dementia, diabetes type 2, hypertension, chronic kidney disease, peripheral vascular disease, hyperlipidemia, chronic systolic CHF and history of DVT who is brought in by his wife due to altered mental status. She reports that patient has not been acting right since Friday. She reports that he's been very weak and unable to get up. He also is has not been talking much. He has not been eating much.   Clinical Impression  Pt struggles to fully work with PT.  He apparently has not been able to do any real walking for ~2 months, but until this week was helping with transfers and able to take a few steps here and there despite mostly being in his w/c.  Pt is confused during session and is unable/unwilling to really try to get to sitting and becomes agitated despite wife being very helpful in trying to convince him.  Pt is unable to go home a this time and will need rehab on discharge.     Follow Up Recommendations SNF    Equipment Recommendations       Recommendations for Other Services       Precautions / Restrictions Precautions Precautions: Fall Restrictions Weight Bearing Restrictions: No      Mobility  Bed Mobility Overal bed mobility: Needs Assistance Bed Mobility: Supine to Sit;Sit to Supine     Supine to sit: Max assist;Mod assist Sit to supine: Mod assist   General bed mobility comments: Pt becomes agitated in trying to get to sitting and explaining trying to get up standing and he had none of it.  Transfers                    Ambulation/Gait                Stairs            Wheelchair Mobility    Modified Rankin (Stroke Patients Only)       Balance                                             Pertinent Vitals/Pain Pain Assessment:   (Pt indicated pain with R shoulder and general LE mvt)    Home Living Family/patient expects to be discharged to:: Private residence Living Arrangements: Spouse/significant other Available Help at Discharge: Family Type of Home: House Home Access: Ramped entrance     Home Layout: One level Home Equipment: Environmental consultant - 2 wheels;Wheelchair - manual      Prior Function Level of Independence: Needs assistance   Gait / Transfers Assistance Needed: Wife and caregiver help him transfer bed to wc     Comments: Utilizes manual WC for primary mobility within the home environment, but does ambulate limited distances with RW at times.  Typically able to transfer to/from [facility] without physical assist, but does require physical assist from wife for ADLs (incontinent at baseline)     Hand Dominance        Extremity/Trunk Assessment   Upper Extremity Assessment: Generalized weakness;Difficult to assess due to impaired cognition (did not follow instructions, appears grossly 3-/5)           Lower Extremity Assessment: Difficult to assess due to impaired cognition;Generalized  weakness (minimal ability to move AROM in b/l LEs)         Communication   Communication: Expressive difficulties (minimally verbal, confused)  Cognition Arousal/Alertness:  (baseline confusion) Behavior During Therapy: Agitated (pt does not appear to understand the situation) Overall Cognitive Status: Within Functional Limits for tasks assessed                      General Comments      Exercises        Assessment/Plan    PT Assessment Patient needs continued PT services  PT Diagnosis Difficulty walking;Generalized weakness   PT Problem List Decreased strength;Decreased activity tolerance;Decreased balance;Decreased mobility;Decreased range of motion;Decreased coordination;Decreased cognition;Decreased safety awareness;Decreased knowledge of use of DME;Pain  PT Treatment Interventions DME  instruction;Gait training;Stair training;Functional mobility training;Therapeutic activities;Therapeutic exercise;Balance training;Neuromuscular re-education   PT Goals (Current goals can be found in the Care Plan section) Acute Rehab PT Goals Patient Stated Goal: wife reports goal to get back home once he gets stronger PT Goal Formulation: With family Time For Goal Achievement: 07/29/15 Potential to Achieve Goals: Fair    Frequency Min 2X/week   Barriers to discharge        Co-evaluation               End of Session Equipment Utilized During Treatment: Gait belt Activity Tolerance: Patient limited by fatigue;Treatment limited secondary to agitation Patient left: with bed alarm set;with call bell/phone within reach;with family/visitor present           Time: [id number] PT Time Calculation (min) (ACUTE ONLY): 27 min   Charges:   PT Evaluation $PT Eval Moderate Complexity: 1 Procedure     PT G Codes:        [name], DPT 07/15/2015, 5:10 PM

## 2015-07-15 NOTE — ED Notes (Signed)
Coffee given to wife; pt resting with eyes closed, resp even and nonlabored

## 2015-07-15 NOTE — ED Provider Notes (Signed)
[facility] Emergency Department Provider Note   ____________________________________________  Time seen: Approximately 513 AM  I have reviewed the triage vital signs and the nursing notes.   HISTORY  Chief Complaint Urinary Frequency; Fever; and Fatigue  History obtained from patient's wife  HPI William Byrd is a 82 y.o. male who comes into the hospital today with weakness. The patient's wife reportsthat he could not get out of his chair today. She reports that the rescue squad came to the house on Friday but after they took his vital signs and determined everything was okay the patient is left-sided for him to stay home. She reports that he got worse prior night and slept all day on Saturday. He has been complaining of some epigastric pain and bilateral lower extremity pain. He's had a nonproductive cough with no nausea or vomiting. At home his temperature was 99.4. He's had no sick contacts and according his wife has been having frequent bowel movements that she has to change him constantly. The patient's wife reports that he has not been acting himself and he's been very out of it and lethargic. She was concerned which is why she called the ambulance again tonight to have him evaluated. She reports that his urine has had a foul smell as well and has been dark in appearance.   Past Medical History  Diagnosis Date  . Dementia   . Diabetes mellitus without complication (HCC)   . Hypertension   . Arthritis   . Chronic kidney disease   . Peripheral vascular disease (HCC)   . Generalized weakness   . Degeneration of lumbar or lumbosacral intervertebral disc   . Spinal stenosis   . Cervicalgia   . Hypercholesteremia   . Glaucoma   . DVT (deep venous thrombosis) (HCC)   . Chronic systolic CHF (congestive heart failure) (HCC)   . Peripheral polyneuropathy (HCC)   . Background retinopathy   . Long-term insulin use [facility])     Patient Active Problem  List   Diagnosis Date Noted  . Dehydration 12/28/2014  . Acute on chronic renal failure (HCC) 12/28/2014  . Generalized weakness 12/28/2014  . Acute renal failure (ARF) (HCC) 12/28/2014  . Lower extremity weakness 09/05/2014  . DVT of lower extremity, bilateral (HCC) 09/05/2014  . DM2 (diabetes mellitus, type 2) (HCC) 09/05/2014  . HTN (hypertension) 09/05/2014  . Dementia 09/05/2014  . CKD (chronic kidney disease) stage 3, GFR 30-59 ml/min 09/05/2014  . HLD (hyperlipidemia) 09/05/2014  . Spinal stenosis 09/05/2014    Past Surgical History  Procedure Laterality Date  . C-spine fusion N/A     Current Outpatient Rx  Name  Route  Sig  Dispense  Refill  . acetaminophen (TYLENOL) 325 MG tablet   Oral   Take 650 mg by mouth every 6 (six) hours as needed for mild pain, moderate pain, fever or headache.         Marland Kitchen amLODipine (NORVASC) 10 MG tablet   Oral   Take 5 mg by mouth daily.          Marland Kitchen atorvastatin (LIPITOR) 20 MG tablet   Oral   Take 20 mg by mouth daily.         . benzonatate (TESSALON) 200 MG capsule   Oral   Take 200 mg by mouth 3 (three) times daily as needed for cough.         . cholecalciferol (VITAMIN D) 1000 UNITS tablet   Oral   Take  1,000 Units by mouth daily.         Marland Kitchen. docusate sodium (STOOL SOFTENER) 100 MG capsule   Oral   Take 100 mg by mouth daily as needed for mild constipation or moderate constipation.          Marland Kitchen. donepezil (ARICEPT) 10 MG tablet   Oral   Take 10 mg by mouth at bedtime.         . DULoxetine (CYMBALTA) 60 MG capsule   Oral   Take 60 mg by mouth daily.         . finasteride (PROSCAR) 5 MG tablet   Oral   Take 5 mg by mouth daily.         . furosemide (LASIX) 20 MG tablet   Oral   Take 20-40 mg by mouth 2 (two) times daily. Take 2 tablets in the morning. Take an additional tablet 6 hours later.         . insulin glargine (LANTUS) 100 UNIT/ML injection   Subcutaneous   Inject 20 Units into the skin at  bedtime.          . insulin lispro (HUMALOG) 100 UNIT/ML injection   Subcutaneous   Inject 5-15 Units into the skin 3 (three) times daily with meals.          Marland Kitchen. ketorolac (ACULAR) 0.4 % SOLN   Both Eyes   Place 1 drop into both eyes 2 (two) times daily.         Marland Kitchen. linagliptin (TRADJENTA) 5 MG TABS tablet   Oral   Take 5 mg by mouth daily.         . memantine (NAMENDA XR) 28 MG CP24 24 hr capsule   Oral   Take 28 mg by mouth daily.         . metoprolol tartrate (LOPRESSOR) 25 MG tablet   Oral   Take 1 tablet (25 mg total) by mouth 2 (two) times daily.         . pregabalin (LYRICA) 75 MG capsule   Oral   Take 1 capsule (75 mg total) by mouth 2 (two) times daily.   30 capsule   1   . rivaroxaban (XARELTO) 20 MG TABS tablet   Oral   Take 1 tablet by mouth daily.         Malvin Johns. Saw Palmetto-Phytosterols (PROSTATE SR) 160-250 MG CAPS   Oral   Take 1 capsule by mouth daily.         . timolol (TIMOPTIC) 0.5 % ophthalmic solution   Both Eyes   Place 1 drop into both eyes 2 (two) times daily.           Allergies Fosinopril  Family History  Problem Relation Age of Onset  . Diabetes Mother   . Arthritis Sister     Social History Social History  Substance Use Topics  . Smoking status: Former Games developermoker  . Smokeless tobacco: None  . Alcohol Use: No    Review of Systems Constitutional:  fever/chills Eyes: No visual changes. ENT: No sore throat. Cardiovascular: Denies chest pain. Respiratory: cough Gastrointestinal:  abdominal pain.  No nausea, no vomiting.  No diarrhea.  No constipation. Genitourinary: Negative for dysuria. Musculoskeletal: Bilateral lower extremity pain Skin: Negative for rash. Neurological: Negative for headaches, focal weakness or numbness.  10-point ROS otherwise negative.  ____________________________________________   PHYSICAL EXAM:  VITAL SIGNS: ED Triage Vitals  Enc Vitals Group     BP 07/15/15 0448 145/75  mmHg      Pulse Rate 07/15/15 0448 97     Resp 07/15/15 0448 22     Temp 07/15/15 0448 101.3 F (38.5 C)     Temp Source 07/15/15 0448 Rectal     SpO2 07/15/15 0448 99 %     Weight 07/15/15 0448 179 lb (81.194 kg)     Height 07/15/15 0448 5' 7.5" (1.715 m)     Head Cir --      Peak Flow --      Pain Score 07/15/15 0450 0     Pain Loc --      Pain Edu? --      Excl. in GC? --     Constitutional: Alert and oriented. Well appearing and in moderate distress. Eyes: Conjunctivae are normal. PERRL. EOMI. Head: Atraumatic. Nose: No congestion/rhinnorhea. Mouth/Throat: Mucous membranes are moist.  Oropharynx non-erythematous. Cardiovascular: Normal rate, regular rhythm. Grossly normal heart sounds.  Good peripheral circulation. Respiratory: Normal respiratory effort.  No retractions. Lungs CTAB. Gastrointestinal: Soft and nontender. No distention. Positive bowel sounds Musculoskeletal: No lower extremity tenderness nor edema.   Neurologic:  Normal speech and language. generalized wakness Skin:  Skin is warm, dry and intact.  Psychiatric: Mood and affect are normal.   ____________________________________________   LABS (all labs ordered are listed, but only abnormal results are displayed)  Labs Reviewed  COMPREHENSIVE METABOLIC PANEL - Abnormal; Notable for the following:    Glucose, Bld 135 (*)    BUN 23 (*)    Creatinine, Ser 1.71 (*)    Calcium 8.8 (*)    Albumin 2.9 (*)    ALT 12 (*)    GFR calc non Af Amer 36 (*)    GFR calc Af Amer 41 (*)    All other components within normal limits  CBC WITH DIFFERENTIAL/PLATELET - Abnormal; Notable for the following:    RBC 3.32 (*)    Hemoglobin 10.4 (*)    HCT 30.0 (*)    RDW 14.9 (*)    Neutro Abs 7.2 (*)    Lymphs Abs 0.6 (*)    Basophils Absolute 0.2 (*)    All other components within normal limits  URINALYSIS COMPLETEWITH MICROSCOPIC ([facility] ONLY) - Abnormal; Notable for the following:    Color, Urine YELLOW (*)    APPearance CLOUDY  (*)    Hgb urine dipstick 1+ (*)    Protein, ur 100 (*)    Nitrite POSITIVE (*)    Leukocytes, UA 3+ (*)    Bacteria, UA MANY (*)    All other components within normal limits  CULTURE, BLOOD (ROUTINE X 2)  CULTURE, BLOOD (ROUTINE X 2)  URINE CULTURE  LACTIC ACID, PLASMA  LACTIC ACID, PLASMA   ____________________________________________  EKG  none ____________________________________________  RADIOLOGY  CXR: left lower lobe atelectasis vs pneumonia ____________________________________________   PROCEDURES  Procedure(s) performed: None  Critical Care performed: No  ____________________________________________   INITIAL IMPRESSION / ASSESSMENT AND PLAN / ED COURSE  Pertinent labs & imaging results that were available during my care of the patient were reviewed by me and considered in my medical decision making (see chart for details).  This is a 82 year old male who comes in for hospital today with some altered mental status and fever. The patient was called a code sepsis on initial arrival to the hospital given his altered mental state and his fever. He did not have any significant tachycardia nor did he have any hypotension. It appears that the patient  has a urinary tract infection and a possible pneumonia based on the x-ray. The patient does not have an elevated white blood cell count at this time. I did order for the patient some ceftriaxone and some Zosyn. The patient will be admitted to the hospitalist service for further evaluation. ____________________________________________   FINAL CLINICAL IMPRESSION(S) / ED DIAGNOSES  Final diagnoses:  Disorientation  Acute cystitis without hematuria  Fever, unspecified fever cause  Left lower lobe pneumonia      NEW MEDICATIONS STARTED DURING THIS VISIT:  New Prescriptions   No medications on file     Note:  This document was prepared using Dragon voice recognition software and may include unintentional  dictation errors.    Rebecka Apley, MD 07/15/15 [phone]

## 2015-07-15 NOTE — Progress Notes (Signed)
Patient moved to room 112, notified patients wife who verbalized understanding  and thanked me for calling

## 2015-07-15 NOTE — Progress Notes (Signed)
Pharmacy Antibiotic Note  William Byrd is a 82 y.o. male admitted on 07/15/2015 with pneumonia and UTI.  Pharmacy has been consulted for levofloxacin dosing.  Patient received a dose of azithromycin and CTX this morning in the ED. Levofloxacin was ordered on admission to begin tomorrow. CrCl ~35 mL/min.  Plan: Order levofloxacin 500 mg IV x 1 dose tomorrow followed by levofloxacin 250 mg IV daily based on renal function and indication.  Height: 5' 7.5" (171.5 cm) Weight: 179 lb (81.194 kg) IBW/kg (Calculated) : 67.25  Temp (24hrs), Avg:100.2 F (37.9 C), Min:99.1 F (37.3 C), Max:101.3 F (38.5 C)   Recent Labs Lab 07/15/15 0459  WBC 8.6  CREATININE 1.71*  LATICACIDVEN 1.1    Estimated Creatinine Clearance: 34.9 mL/min (by C-G formula based on Cr of 1.71).    Allergies  Allergen Reactions  . Fosinopril Other (See Comments)    Reaction: Hyperkalemia    Antimicrobials this admission: CTX & azithromycin 6/4 in ED levofloxacin 6/5 >>   Dose adjustments this admission:  Microbiology results: 6/4 BCx: Sent 6/4 UCx: Sent   Thank you for allowing pharmacy to be a part of this patient's care.  Cindi CarbonMary M Shianne Zeiser, PharmD Clinical Pharmacist 07/15/2015 10:35 AM

## 2015-07-15 NOTE — Progress Notes (Signed)
Pharmacy Antibiotic Note  William Byrd is a 82 y.o. male admitted on 07/15/2015 with pneumonia.  Pharmacy has been consulted for ceftriaxone and azithromycin dosing.  Plan: Ceftriaxone 1 gram q 24 hours ordered. Azithromycin 500 mg IV q 24 hours ordered.  Height: 5' 7.5" (171.5 cm) Weight: 179 lb (81.194 kg) IBW/kg (Calculated) : 67.25  Temp (24hrs), Avg:101.3 F (38.5 C), Min:101.3 F (38.5 C), Max:101.3 F (38.5 C)   Recent Labs Lab 07/15/15 0459  WBC 8.6  CREATININE 1.71*  LATICACIDVEN 1.1    Estimated Creatinine Clearance: 34.9 mL/min (by C-G formula based on Cr of 1.71).    Allergies  Allergen Reactions  . Fosinopril Other (See Comments)    Reaction: Hyperkalemia     Antimicrobials this admission: cefrriaxone  >>  azithromycin  >>   Dose adjustments this admission:   Microbiology results: 6/4 BCx: pending 6/4 UCx: pending   6/4 UA: LE(+) NO2(+) WBC TNTC 6/4 CXR: Left lower lobe atelectasis or pneumonia.  Thank you for allowing pharmacy to be a part of this patient's care.  Mahesh Sizemore S 07/15/2015 6:37 AM

## 2015-07-15 NOTE — H&P (Signed)
[facility] -  at [facility]   PATIENT NAME: Oracio Galen    MR#:  [id number]  DATE OF BIRTH:  [birth year]  DATE OF ADMISSION:  07/15/2015  PRIMARY CARE PHYSICIAN: Marisue Ivan, MD   REQUESTING/REFERRING PHYSICIAN:   CHIEF COMPLAINT:   Chief Complaint  Patient presents with  . Urinary Frequency  . Fever  . Fatigue    HISTORY OF PRESENT ILLNESS: Dmario Russom  is a 82 y.o. male with a known history of Dementia, diabetes type 2, hypertension, chronic kidney disease, peripheral vascular disease, hyperlipidemia, chronic systolic CHF and history of DVT who is brought in by his wife due to altered mental status. She reports that patient has not been acting right since Friday. She reports that he's been very weak and unable to get up. He also is has not been talking much. He has not been eating much. Due to his change in condition EMS was called. When patient arrived here he was noted to have urinary tract infection and a chest x-ray suggestive of pneumonia. According to the ED physician when he initially arrived he was not able to give any history. At the time of my evaluation. Patient does have a left-sided facial droop. Which his wife reports that has been going on since Friday. He also has been having cough.    PAST MEDICAL HISTORY:   Past Medical History  Diagnosis Date  . Dementia   . Diabetes mellitus without complication (HCC)   . Hypertension   . Arthritis   . Chronic kidney disease   . Peripheral vascular disease (HCC)   . Generalized weakness   . Degeneration of lumbar or lumbosacral intervertebral disc   . Spinal stenosis   . Cervicalgia   . Hypercholesteremia   . Glaucoma   . DVT (deep venous thrombosis) (HCC)   . Chronic systolic CHF (congestive heart failure) (HCC)   . Peripheral polyneuropathy (HCC)   . Background retinopathy   . Long-term insulin use (HCC)     PAST SURGICAL HISTORY: Past Surgical History  Procedure  Laterality Date  . C-spine fusion N/A     SOCIAL HISTORY:  Social History  Substance Use Topics  . Smoking status: Former Games developer  . Smokeless tobacco: Not on file  . Alcohol Use: No    FAMILY HISTORY:  Family History  Problem Relation Age of Onset  . Diabetes Mother   . Arthritis Sister     DRUG ALLERGIES:  Allergies  Allergen Reactions  . Fosinopril Other (See Comments)    Reaction: Hyperkalemia     REVIEW OF SYSTEMS:   CONSTITUTIONAL: Unable to provide due to patient's mental status   MEDICATIONS AT HOME:  Prior to Admission medications   Medication Sig Start Date End Date Taking? Authorizing Provider  acetaminophen (TYLENOL) 325 MG tablet Take 650 mg by mouth every 6 (six) hours as needed for mild pain, moderate pain, fever or headache.   Yes Historical Provider, MD  amLODipine (NORVASC) 10 MG tablet Take 5 mg by mouth daily.    Yes Historical Provider, MD  atorvastatin (LIPITOR) 20 MG tablet Take 20 mg by mouth daily.   Yes Historical Provider, MD  benzonatate (TESSALON) 200 MG capsule Take 200 mg by mouth 3 (three) times daily as needed for cough.   Yes Historical Provider, MD  cholecalciferol (VITAMIN D) 1000 UNITS tablet Take 1,000 Units by mouth daily.   Yes Historical Provider, MD  docusate sodium (STOOL SOFTENER) 100 MG capsule Take 100  mg by mouth daily as needed for mild constipation or moderate constipation.    Yes Historical Provider, MD  donepezil (ARICEPT) 10 MG tablet Take 10 mg by mouth at bedtime. 07/05/14  Yes Historical Provider, MD  DULoxetine (CYMBALTA) 60 MG capsule Take 60 mg by mouth daily. 06/01/14  Yes Historical Provider, MD  finasteride (PROSCAR) 5 MG tablet Take 5 mg by mouth daily. 07/19/14  Yes Historical Provider, MD  furosemide (LASIX) 20 MG tablet Take 20-40 mg by mouth 2 (two) times daily. Take 2 tablets in the morning. Take an additional tablet 6 hours later.   Yes Historical Provider, MD  insulin glargine (LANTUS) 100 UNIT/ML injection  Inject 20 Units into the skin at bedtime.    Yes Historical Provider, MD  insulin lispro (HUMALOG) 100 UNIT/ML injection Inject 5-15 Units into the skin 3 (three) times daily with meals.    Yes Historical Provider, MD  ketorolac (ACULAR) 0.4 % SOLN Place 1 drop into both eyes 2 (two) times daily.   Yes Historical Provider, MD  linagliptin (TRADJENTA) 5 MG TABS tablet Take 5 mg by mouth daily.   Yes Historical Provider, MD  memantine (NAMENDA XR) 28 MG CP24 24 hr capsule Take 28 mg by mouth daily.   Yes Historical Provider, MD  metoprolol tartrate (LOPRESSOR) 25 MG tablet Take 1 tablet (25 mg total) by mouth 2 (two) times daily. 09/08/14  Yes [name], MD  pregabalin (LYRICA) 75 MG capsule Take 1 capsule (75 mg total) by mouth 2 (two) times daily. 12/31/14  Yes Houston Siren, MD  rivaroxaban (XARELTO) 20 MG TABS tablet Take 1 tablet by mouth daily. 07/18/14  Yes Historical Provider, MD  Saw Palmetto-Phytosterols (PROSTATE SR) 160-250 MG CAPS Take 1 capsule by mouth daily.   Yes Historical Provider, MD  timolol (TIMOPTIC) 0.5 % ophthalmic solution Place 1 drop into both eyes 2 (two) times daily.   Yes Historical Provider, MD      PHYSICAL EXAMINATION:   VITAL SIGNS: Blood pressure 122/62, pulse 81, temperature 99.1 F (37.3 C), temperature source Rectal, resp. rate 22, height 5' 7.5" (1.715 m), weight 81.194 kg (179 lb), SpO2 98 %.  GENERAL:  82 y.o.-year-old patient lying in the bed with no acute distress.  EYES: Pupils equal, round, reactive to light and accommodation. No scleral icterus. Extraocular muscles intact.  HEENT: Head atraumatic, normocephalic. Oropharynx and nasopharynx clear.  NECK:  Supple, no jugular venous distention. No thyroid enlargement, no tenderness.  LUNGS: Rhonchus breath sounds at the left lung base   there is no sensory muscle usage no wheezing or rhonchi  CARDIOVASCULAR: S1, S2 normal. No murmurs, rubs, or gallops.  ABDOMEN: Soft, nontender, nondistended.  Bowel sounds present. No organomegaly or mass.  EXTREMITIES: No pedal edema, cyanosis, or clubbing.  NEUROLOGIC: left sided facial droop , strength is diminished in all extremities.  PSYCHIATRIC: The patient is alert   not oriented to time or placeSKIN: No obvious rash, lesion, or ulcer.   LABORATORY PANEL:   CBC  Recent Labs Lab 07/15/15 0459  WBC 8.6  HGB 10.4*  HCT 30.0*  PLT 241  MCV 90.5  MCH 31.4  MCHC 34.7  RDW 14.9*  LYMPHSABS 0.6*  MONOABS 0.6  EOSABS 0.0  BASOSABS 0.2*   ------------------------------------------------------------------------------------------------------------------  Chemistries   Recent Labs Lab 07/15/15 0459  NA 138  K 4.4  CL 106  CO2 25  GLUCOSE 135*  BUN 23*  CREATININE 1.71*  CALCIUM 8.8*  AST 22  ALT  12*  ALKPHOS 70  BILITOT 0.6   ------------------------------------------------------------------------------------------------------------------ estimated creatinine clearance is 34.9 mL/min (by C-G formula based on Cr of 1.71). ------------------------------------------------------------------------------------------------------------------ No results for input(s): TSH, T4TOTAL, T3FREE, THYROIDAB in the last 72 hours.  Invalid input(s): FREET3   Coagulation profile No results for input(s): INR, PROTIME in the last 168 hours. ------------------------------------------------------------------------------------------------------------------- No results for input(s): DDIMER in the last 72 hours. -------------------------------------------------------------------------------------------------------------------  Cardiac Enzymes No results for input(s): CKMB, TROPONINI, MYOGLOBIN in the last 168 hours.  Invalid input(s): CK ------------------------------------------------------------------------------------------------------------------ Invalid input(s):  POCBNP  ---------------------------------------------------------------------------------------------------------------  Urinalysis    Component Value Date/Time   COLORURINE YELLOW* 07/15/2015 0459   COLORURINE Yellow 02/22/2014 1648   APPEARANCEUR CLOUDY* 07/15/2015 0459   APPEARANCEUR Clear 02/22/2014 1648   LABSPEC 1.013 07/15/2015 0459   LABSPEC 1.017 02/22/2014 1648   PHURINE 6.0 07/15/2015 0459   PHURINE 5.0 02/22/2014 1648   GLUCOSEU NEGATIVE 07/15/2015 0459   GLUCOSEU >=500 02/22/2014 1648   HGBUR 1+* 07/15/2015 0459   HGBUR Negative 02/22/2014 1648   BILIRUBINUR NEGATIVE 07/15/2015 0459   BILIRUBINUR Negative 02/22/2014 1648   KETONESUR NEGATIVE 07/15/2015 0459   KETONESUR Negative 02/22/2014 1648   PROTEINUR 100* 07/15/2015 0459   PROTEINUR 100 mg/dL 16/11/9602 5409   NITRITE POSITIVE* 07/15/2015 0459   NITRITE Negative 02/22/2014 1648   LEUKOCYTESUR 3+* 07/15/2015 0459   LEUKOCYTESUR Negative 02/22/2014 1648     RADIOLOGY: Dg Chest 2 View  07/15/2015  CLINICAL DATA:  Fever and weakness. EXAM: CHEST  2 VIEW COMPARISON:  12/28/2014 FINDINGS: Streaky opacity in the retrocardiac lung without convincing volume loss. No edema, effusion, or air leak. Normal heart size and mediastinal contours. Calcified granulomas in the spleen. IMPRESSION: Left lower lobe atelectasis or pneumonia. Electronically Signed   By: Marnee Spring M.D.   On: 07/15/2015 05:43    EKG: Orders placed or performed during the hospital encounter of 07/15/15  . ED EKG 12-Lead  . ED EKG 12-Lead    IMPRESSION AND PLAN: Patient is a 82 year old African-American male with altered mental status  1. Acute encephalopathy: At this point I will need to rule out a stroke. We'll obtain a stat CT scan of the head Treat underlying infection due to urinary infection as well as pneumonia with Levaquin I will have PT evaluate the patient If CT scan is positive for stroke and further workup for stroke  2.  Urinary tract infection treated with Levaquin and obtain cultures  3. Pneumonia. Patient did have a stroke he could have aspiration pneumonia we'll treat with Levaquin Speech evaluation  4. Diabetes type 2: Place on sliding scale hold insulin and oral medications  5. History of DVT continue Xarelto  6. Chronic kidney disease monitor renal function will give him IV fluids and hold Lasix  7. History of chronic diastolic CHF will Lasix currently appears dry  8. Miscellaneous DVT prophylaxis patient   on Xarelto     All the records are reviewed and case discussed with ED provider. Management plans discussed with the patient, family and they are in agreement.  CODE STATUS:    Code Status Orders        Start     Ordered   07/15/15 0829  Full code   Continuous     07/15/15 0830    Code Status History    Date Active Date Inactive Code Status Order ID Comments User Context   12/28/2014  9:50 PM 12/31/2014  4:38 PM Full Code [id number]  Altamese Dilling, MD Inpatient  09/05/2014  4:09 AM 09/08/2014  6:11 PM Full Code [id number]  Crissie FiguresEdavally N Reddy, MD ED       TOTAL TIME TAKING CARE OF THIS PATIENT: 55minutes.    Auburn BilberryPATEL, Antwian Santaana M.D on 07/15/2015 at 8:41 AM  Between 7am to 6pm - Pager - [phone]  After 6pm go to [email or web address] - password [email or web address] [facility]  [facility]  Office  [phone]  CC: Primary care physician; [name], [name], MD

## 2015-07-15 NOTE — ED Notes (Signed)
2 attempts at second IV access by this RN unsuccessful; pt tolerated well

## 2015-07-15 NOTE — Progress Notes (Signed)
PHARMACIST - PHYSICIAN ORDER COMMUNICATION  CONCERNING: P&T Medication Policy on Herbal Medications  DESCRIPTION:  This patient's order for:  Prostate SR  has been noted.  This product(s) is classified as an "herbal" or natural product. Due to a lack of definitive safety studies or FDA approval, nonstandard manufacturing practices, plus the potential risk of unknown drug-drug interactions while on inpatient medications, the Pharmacy and Therapeutics Committee does not permit the use of "herbal" or natural products of this type within [facility].   ACTION TAKEN: The pharmacy department is unable to verify this order at this time and your patient has been informed of this safety policy. Please reevaluate patient's clinical condition at discharge and address if the herbal or natural product(s) should be resumed at that time.  Cindi CarbonMary M Sharona Rovner, PharmD 10:21 AM

## 2015-07-15 NOTE — ED Notes (Signed)
EMS pt from home with c/o not feeling well since Wednesday; wife reported to EMS pt had a fever and urinary frequency over the last few days; pt denies weakness to this nurse; slow to speak; unsure of baseline until wife arrives and can give more information; pt with advanced stages of dementia

## 2015-07-15 NOTE — ED Notes (Signed)
To x-ray via stretcher.

## 2015-07-15 NOTE — ED Notes (Signed)
Dr Zenda Alperswebster at bedside; wife has just recently arrived and is at bedside as well; she reports pt has been feeling bad since Friday, fever started in the night; increasing weakness; urinary frequency;

## 2015-07-15 NOTE — ED Notes (Signed)
Pt back from x-ray.

## 2015-07-16 LAB — BASIC METABOLIC PANEL
Anion gap: 5 (ref 5–15)
BUN: 20 mg/dL (ref 6–20)
CHLORIDE: 109 mmol/L (ref 101–111)
CO2: 25 mmol/L (ref 22–32)
Calcium: 8.4 mg/dL — ABNORMAL LOW (ref 8.9–10.3)
Creatinine, Ser: 1.34 mg/dL — ABNORMAL HIGH (ref 0.61–1.24)
GFR calc Af Amer: 55 mL/min — ABNORMAL LOW (ref >60)
GFR, EST NON AFRICAN AMERICAN: 48 mL/min — AB (ref >60)
Glucose, Bld: 84 mg/dL (ref 65–99)
POTASSIUM: 3.9 mmol/L (ref 3.5–5.1)
SODIUM: 139 mmol/L (ref 135–145)

## 2015-07-16 LAB — CBC
HCT: 30.2 % — ABNORMAL LOW (ref 40.0–52.0)
HEMOGLOBIN: 10.3 g/dL — AB (ref 13.0–18.0)
MCH: 31.6 pg (ref 26.0–34.0)
MCHC: 34 g/dL (ref 32.0–36.0)
MCV: 92.9 fL (ref 80.0–100.0)
Platelets: 206 10*3/uL (ref 150–440)
RBC: 3.24 MIL/uL — ABNORMAL LOW (ref 4.40–5.90)
RDW: 14.9 % — ABNORMAL HIGH (ref 11.5–14.5)
WBC: 5 10*3/uL (ref 3.8–10.6)

## 2015-07-16 LAB — GLUCOSE, CAPILLARY
GLUCOSE-CAPILLARY: 75 mg/dL (ref 65–99)
GLUCOSE-CAPILLARY: 185 mg/dL — AB (ref 65–99)
GLUCOSE-CAPILLARY: 233 mg/dL — AB (ref 65–99)
GLUCOSE-CAPILLARY: 242 mg/dL — AB (ref 65–99)

## 2015-07-16 MED ORDER — AMLODIPINE BESYLATE 10 MG PO TABS
10.0000 mg | ORAL_TABLET | Freq: Every day | ORAL | Status: DC
  Administered 2015-07-16 – 2015-07-18 (×3): 10 mg via ORAL
  Filled 2015-07-16 (×3): qty 1

## 2015-07-16 MED ORDER — ASPIRIN 81 MG PO CHEW
CHEWABLE_TABLET | ORAL | Status: AC
  Filled 2015-07-16: qty 1

## 2015-07-16 MED ORDER — ASPIRIN 81 MG PO CHEW
81.0000 mg | CHEWABLE_TABLET | Freq: Every day | ORAL | Status: DC
  Administered 2015-07-16 – 2015-07-18 (×3): 81 mg via ORAL
  Filled 2015-07-16 (×4): qty 1

## 2015-07-16 NOTE — Progress Notes (Signed)
Per MD Retta MacSh. Patel - no need to continue frequent neuro and vital sign checks or NIH. Bo McclintockBrewer,Rosemary Pentecost S, RN

## 2015-07-16 NOTE — Progress Notes (Signed)
Pharmacy Antibiotic Note- Day 2 of antibiotics  William Byrd is a 82 y.o. male admitted on 07/15/2015 with pneumonia and UTI.  Pharmacy has been consulted for levofloxacin dosing.  Plan: Will continue levofloxacin 250mg  IV Q24hr.  Height: 5\' 7"  (170.2 cm) Weight: 173 lb 1 oz (78.501 kg) IBW/kg (Calculated) : 66.1  Temp (24hrs), Avg:98.3 F (36.8 C), Min:97.7 F (36.5 C), Max:98.7 F (37.1 C)   Recent Labs Lab 07/15/15 0459 07/15/15 1047 07/16/15 0519  WBC 8.6  --  5.0  CREATININE 1.71*  --  1.34*  LATICACIDVEN 1.1 1.4  --     Estimated Creatinine Clearance: 39.7 mL/min (by C-G formula based on Cr of 1.34).    Allergies  Allergen Reactions  . Fosinopril Other (See Comments)    Reaction: Hyperkalemia    Antimicrobials this admission: CTX & azithromycin 6/4 in ED levofloxacin 6/5 >>   Dose adjustments this admission:  Microbiology results: 6/4 BCx: NG < 12 hour.  6/4 UCx: Sent   Pharmacy will continue to monitor and adjust per consult.   William Byrd,William Byrd 07/16/2015 8:50 AM

## 2015-07-16 NOTE — Progress Notes (Signed)
[facility] [place] at [facility]                                                                                                                                                                                            Patient Demographics   William Byrd, is a 82 y.o. male, DOB - 1931/09/08, ZOX:[id number]  Admit date - 07/15/2015   Admitting Physician Auburn Bilberry, MD  Outpatient Primary MD for the patient is Marisue Ivan, MD   LOS - 1  Subjective:  Patient feeling better, more awake but still very weak    Review of Systems:   CONSTITUTIONAL: No documented fever. Positive fatigue, positive weakness. No weight gain, no weight loss.  EYES: No blurry or double vision.  ENT: No tinnitus. No postnasal drip. No redness of the oropharynx.  RESPIRATORY: No cough, no wheeze, no hemoptysis. No dyspnea.  CARDIOVASCULAR: No chest pain. No orthopnea. No palpitations. No syncope.  GASTROINTESTINAL: No nausea, no vomiting or diarrhea. No abdominal pain. No melena or hematochezia.  GENITOURINARY: No dysuria or hematuria.  ENDOCRINE: No polyuria or nocturia. No heat or cold intolerance.  HEMATOLOGY: No anemia. No bruising. No bleeding.  INTEGUMENTARY: No rashes. No lesions.  MUSCULOSKELETAL: No arthritis. No swelling. No gout.  NEUROLOGIC: No numbness, tingling, or ataxia. No seizure-type activity.  PSYCHIATRIC: No anxiety. No insomnia. No ADD.    Vitals:   Filed Vitals:   07/15/15 2001 07/15/15 2158 07/16/15 0447 07/16/15 0812  BP: 161/70 177/70 188/70 183/75  Pulse: 73 73 72 77  Temp: 98.2 F (36.8 C) 98 F (36.7 C) 98.2 F (36.8 C) 98.7 F (37.1 C)  TempSrc: Oral Oral Oral Oral  Resp: 18 18  18   Height:      Weight:      SpO2: 100% 98% 100% 98%    Wt Readings from Last 3 Encounters:  07/15/15 78.501 kg (173 lb 1 oz)  12/28/14 81.194 kg (179 lb)  10/03/14 79.379 kg (175 lb)     Intake/Output Summary (Last 24 hours) at 07/16/15 1206 Last  data filed at 07/16/15 0705  Gross per 24 hour  Intake      0 ml  Output      0 ml  Net      0 ml    Physical Exam:   GENERAL: Pleasant-appearing in no apparent distress.  HEAD, EYES, EARS, NOSE AND THROAT: Atraumatic, normocephalic. Extraocular muscles are intact. Pupils equal and reactive to light. Sclerae anicteric. No conjunctival injection. No oro-pharyngeal erythema.  NECK: Supple. There is no jugular venous distention. No bruits, no lymphadenopathy, no thyromegaly.  HEART:  Regular rate and rhythm,. No murmurs, no rubs, no clicks.  LUNGS: Clear to auscultation bilaterally. No rales or rhonchi. No wheezes.  ABDOMEN: Soft, flat, nontender, nondistended. Has good bowel sounds. No hepatosplenomegaly appreciated.  EXTREMITIES: No evidence of any cyanosis, clubbing, or peripheral edema.  +2 pedal and radial pulses bilaterally.  NEUROLOGIC: The patient is alert, awake, and oriented x3 with no focal motor or sensory deficits appreciated bilaterally.  SKIN: Moist and warm with no rashes appreciated.  Psych: Not anxious, depressed LN: No inguinal LN enlargement    Antibiotics   Anti-infectives    Start     Dose/Rate Route Frequency Ordered Stop   07/17/15 1000  Levofloxacin (LEVAQUIN) IVPB 250 mg     250 mg 50 mL/hr over 60 Minutes Intravenous Every 24 hours 07/15/15 1039     07/16/15 1000  azithromycin (ZITHROMAX) 500 mg in dextrose 5 % 250 mL IVPB  Status:  Discontinued     500 mg 250 mL/hr over 60 Minutes Intravenous Every 24 hours 07/15/15 0635 07/15/15 0843   07/16/15 1000  levofloxacin (LEVAQUIN) IVPB 500 mg     500 mg 100 mL/hr over 60 Minutes Intravenous  Once 07/15/15 1039     07/16/15 0900  cefTRIAXone (ROCEPHIN) 1 g in dextrose 5 % 50 mL IVPB  Status:  Discontinued     1 g 100 mL/hr over 30 Minutes Intravenous Every 24 hours 07/15/15 0635 07/15/15 0843   07/16/15 0000  levofloxacin (LEVAQUIN) IVPB 500 mg  Status:  Discontinued     500 mg 100 mL/hr over 60 Minutes  Intravenous Every 24 hours 07/15/15 0831 07/15/15 1038   07/15/15 0845  levofloxacin (LEVAQUIN) IVPB 500 mg  Status:  Discontinued     500 mg 100 mL/hr over 60 Minutes Intravenous Every 24 hours 07/15/15 0830 07/15/15 0831   07/15/15 0600  cefTRIAXone (ROCEPHIN) 2 g in dextrose 5 % 50 mL IVPB     2 g 100 mL/hr over 30 Minutes Intravenous  Once 07/15/15 0559 07/15/15 0658   07/15/15 0600  azithromycin (ZITHROMAX) 500 mg in dextrose 5 % 250 mL IVPB     500 mg 250 mL/hr over 60 Minutes Intravenous  Once 07/15/15 0559 07/15/15 0809      Medications   Scheduled Meds: . [START ON 07/17/2015] amLODipine  10 mg Oral Daily  . aspirin  300 mg Rectal Daily  . atorvastatin  20 mg Oral Daily  . cholecalciferol  1,000 Units Oral Daily  . donepezil  10 mg Oral QHS  . DULoxetine  60 mg Oral Daily  . finasteride  5 mg Oral Daily  . insulin aspart  0-9 Units Subcutaneous TID WC  . levofloxacin (LEVAQUIN) IV  500 mg Intravenous Once   Followed by  . [START ON 07/17/2015] levofloxacin (LEVAQUIN) IV  250 mg Intravenous Q24H  . memantine  28 mg Oral Daily  . metoprolol tartrate  25 mg Oral BID  . pregabalin  75 mg Oral BID  . rivaroxaban  20 mg Oral Q supper  . sodium chloride flush  3 mL Intravenous Q12H  . timolol  1 drop Both Eyes BID   Continuous Infusions: . sodium chloride 100 mL/hr at 07/16/15 0905   PRN Meds:.acetaminophen **OR** acetaminophen, benzonatate, docusate sodium, ondansetron **OR** ondansetron (ZOFRAN) IV   Data Review:   Micro Results Recent Results (from the past 240 hour(s))  Urine culture     Status: Abnormal (Preliminary result)   Collection Time: 07/15/15  4:57 AM  Result Value Ref Range Status   Specimen Description URINE, CLEAN CATCH  Final   Special Requests NONE  Final   Culture >=100,000 COLONIES/mL GRAM NEGATIVE RODS (A)  Final   Report Status PENDING  Incomplete  Culture, blood (Routine x 2)     Status: None (Preliminary result)   Collection Time: 07/15/15   4:59 AM  Result Value Ref Range Status   Specimen Description BLOOD LEFT WRIST  Final   Special Requests   Final    BOTTLES DRAWN AEROBIC AND ANAEROBIC 15CCAERO,10CCANA   Culture NO GROWTH < 12 HOURS  Final   Report Status PENDING  Incomplete  Culture, blood (Routine x 2)     Status: None (Preliminary result)   Collection Time: 07/15/15  5:50 AM  Result Value Ref Range Status   Specimen Description BLOOD RIGHT FOREARM  Final   Special Requests   Final    BOTTLES DRAWN AEROBIC AND ANAEROBIC 15CCAERO,15CCANA   Culture NO GROWTH < 12 HOURS  Final   Report Status PENDING  Incomplete    Radiology Reports Dg Chest 2 View  07/15/2015  CLINICAL DATA:  Fever and weakness. EXAM: CHEST  2 VIEW COMPARISON:  12/28/2014 FINDINGS: Streaky opacity in the retrocardiac lung without convincing volume loss. No edema, effusion, or air leak. Normal heart size and mediastinal contours. Calcified granulomas in the spleen. IMPRESSION: Left lower lobe atelectasis or pneumonia. Electronically Signed   By: Marnee SpringJonathon  Watts M.D.   On: 07/15/2015 05:43   Ct Head Wo Contrast  07/15/2015  CLINICAL DATA:  Altered mental status and increased confusion beginning yesterday. EXAM: CT HEAD WITHOUT CONTRAST TECHNIQUE: Contiguous axial images were obtained from the base of the skull through the vertex without intravenous contrast. COMPARISON:  12/28/2014 FINDINGS: There is no evidence of intracranial hemorrhage, brain edema, or other signs of acute infarction. There is no evidence of intracranial mass lesion or mass effect. No abnormal extraaxial fluid collections are identified. Central pattern of cerebral atrophy and mild chronic small vessel disease remains stable in appearance. Ventricles are stable in size. No skull abnormality identified. IMPRESSION: No acute intracranial abnormality. Stable cerebral atrophy and chronic small vessel disease. Electronically Signed   By: Myles RosenthalJohn  Stahl M.D.   On: 07/15/2015 09:21   Mr Brain Wo  Contrast  07/15/2015  CLINICAL DATA:  Altered mental status for 3 days. Increased weakness. Decreased walking. Decreased appetite. EXAM: MRI HEAD WITHOUT CONTRAST TECHNIQUE: Multiplanar, multiecho pulse sequences of the brain and surrounding structures were obtained without intravenous contrast. COMPARISON:  CT head without contrast 07/15/2015. FINDINGS: The diffusion-weighted images demonstrate no evidence for acute or subacute infarction. Acute hemorrhage or mass lesion is present. Moderate atrophy and white matter disease is present bilaterally. Extensive white matter disease and remote lacunar infarcts are present within the central pons. There dilated perivascular spaces throughout the basal ganglia. A remote lacunar infarct is present in the right cerebellum. Flow is present in the major intracranial arteries. Bilateral lens replacements are present. The globes and orbits are otherwise intact. The paranasal sinuses and the mastoid air cells are clear. Leftward nasal septal spurring is present. The nasal cavity is clear. Skullbase is within normal limits. Midline sagittal images are unremarkable. IMPRESSION: 1. No acute intracranial abnormality. 2. Moderate generalized atrophy and diffuse white matter disease likely reflects the sequela of chronic microvascular ischemia. 3. Diffuse white matter changes within the brainstem and remote lacunar infarcts of the central pons. 4. Remote lacunar infarct of the right cerebellum. Electronically Signed  By: Marin Roberts M.D.   On: 07/15/2015 14:02     CBC  Recent Labs Lab 07/15/15 0459 07/16/15 0519  WBC 8.6 5.0  HGB 10.4* 10.3*  HCT 30.0* 30.2*  PLT 241 206  MCV 90.5 92.9  MCH 31.4 31.6  MCHC 34.7 34.0  RDW 14.9* 14.9*  LYMPHSABS 0.6*  --   MONOABS 0.6  --   EOSABS 0.0  --   BASOSABS 0.2*  --     Chemistries   Recent Labs Lab 07/15/15 0459 07/16/15 0519  NA 138 139  K 4.4 3.9  CL 106 109  CO2 25 25  GLUCOSE 135* 84  BUN 23* 20   CREATININE 1.71* 1.34*  CALCIUM 8.8* 8.4*  AST 22  --   ALT 12*  --   ALKPHOS 70  --   BILITOT 0.6  --    ------------------------------------------------------------------------------------------------------------------ estimated creatinine clearance is 39.7 mL/min (by C-G formula based on Cr of 1.34). ------------------------------------------------------------------------------------------------------------------ No results for input(s): HGBA1C in the last 72 hours. ------------------------------------------------------------------------------------------------------------------ No results for input(s): CHOL, HDL, LDLCALC, TRIG, CHOLHDL, LDLDIRECT in the last 72 hours. ------------------------------------------------------------------------------------------------------------------ No results for input(s): TSH, T4TOTAL, T3FREE, THYROIDAB in the last 72 hours.  Invalid input(s): FREET3 ------------------------------------------------------------------------------------------------------------------ No results for input(s): VITAMINB12, FOLATE, FERRITIN, TIBC, IRON, RETICCTPCT in the last 72 hours.  Coagulation profile No results for input(s): INR, PROTIME in the last 168 hours.  No results for input(s): DDIMER in the last 72 hours.  Cardiac Enzymes No results for input(s): CKMB, TROPONINI, MYOGLOBIN in the last 168 hours.  Invalid input(s): CK ------------------------------------------------------------------------------------------------------------------ Invalid input(s): POCBNP    Assessment & Plan   IMPRESSION AND PLAN: Patient is a 82 year old African-American male with altered mental status  1. Acute encephalopathy: Due to urinary tract infection and pneumonia Continue IV Levaquin  2. Urinary tract infection gram-negative rods await final cultures continue Levaquin patient responding  3. Pneumonia.  Continue Levaquin symptoms improved   4. Old lacunar  infarcts on MRI aspirin therapy  5. Diabetes type 2: ssi, for now resume oral meds once adequate po  6 History of DVT continue Xarelto  7. Chronic kidney disease monitor renal function will give him IV fluids and hold Lasix  8. History of chronic diastolic CHF will Lasix currently appears dry discountine ivf  9. Miscellaneous DVT prophylaxis patient on Xarelto     Code Status Orders        Start     Ordered   07/15/15 0829  Full code   Continuous     07/15/15 0830    Code Status History    Date Active Date Inactive Code Status Order ID Comments User Context   12/28/2014  9:50 PM 12/31/2014  4:38 PM Full Code [id number]  Altamese Dilling, MD Inpatient   09/05/2014  4:09 AM 09/08/2014  6:11 PM Full Code [id number]  Crissie Figures, MD ED           Consults none   DVT Prophylaxis  xarelto  Lab Results  Component Value Date   PLT 206 07/16/2015     Time Spent in minutes    Greater than 50% of time spent in care coordination and counseling patient regarding the condition and plan of care.   Auburn Bilberry M.D on 07/16/2015 at 12:06 PM  Between 7am to 6pm - Pager - [phone]  After 6pm go to [email or web address] - password [email or web address] [facility]  [facility] [place] [facility]   Office  [phone]

## 2015-07-16 NOTE — NC FL2 (Signed)
[place] MEDICAID FL2 LEVEL OF CARE SCREENING TOOL     IDENTIFICATION  Patient Name: William Byrd Birthdate: [birth year] Sex: male Admission Date (Current Location): 07/15/2015  [place] Number:  Chiropodist and Address:  [facility], [address] [place], [place] [address]      Provider Number: [id number]  Attending Physician Name and Address:  Auburn Bilberry, MD  Relative Name and Phone Number:       Current Level of Care: Hospital Recommended Level of Care: Skilled Nursing Facility Prior Approval Number:    Date Approved/Denied:   PASRR Number: [id number]  Discharge Plan: SNF    Current Diagnoses: Patient Active Problem List   Diagnosis Date Noted  . CVA (cerebral infarction) 07/15/2015  . Dehydration 12/28/2014  . Acute on chronic renal failure (HCC) 12/28/2014  . Generalized weakness 12/28/2014  . Acute renal failure (ARF) (HCC) 12/28/2014  . Lower extremity weakness 09/05/2014  . DVT of lower extremity, bilateral (HCC) 09/05/2014  . DM2 (diabetes mellitus, type 2) (HCC) 09/05/2014  . HTN (hypertension) 09/05/2014  . Dementia 09/05/2014  . CKD (chronic kidney disease) stage 3, GFR 30-59 ml/min 09/05/2014  . HLD (hyperlipidemia) 09/05/2014  . Spinal stenosis 09/05/2014    Orientation RESPIRATION BLADDER Height & Weight     Self, Place  Normal Incontinent Weight: 173 lb 1 oz (78.501 kg) Height:   (170.2 cm)  BEHAVIORAL SYMPTOMS/MOOD NEUROLOGICAL BOWEL NUTRITION STATUS   (none)  (none) Incontinent Diet (regular)  AMBULATORY STATUS COMMUNICATION OF NEEDS Skin   Extensive Assist Verbally Normal                       Personal Care Assistance Level of Assistance  Bathing, Dressing, Feeding Bathing Assistance: Limited assistance Feeding assistance: Limited assistance Dressing Assistance: Limited assistance     Functional Limitations Info             SPECIAL CARE FACTORS FREQUENCY  PT (By  licensed PT)                    Contractures Contractures Info: Not present    Additional Factors Info  Code Status, Allergies Code Status Info: full Allergies Info: fosinopril           Current Medications (07/16/2015):  This is the current hospital active medication list Current Facility-Administered Medications  Medication Dose Route Frequency Provider Last Rate Last Dose  . acetaminophen (TYLENOL) tablet 650 mg  650 mg Oral Q6H PRN Auburn Bilberry, MD       Or  . acetaminophen (TYLENOL) suppository 650 mg  650 mg Rectal Q6H PRN Auburn Bilberry, MD      . amLODipine (NORVASC) tablet 10 mg  10 mg Oral Daily Auburn Bilberry, MD   10 mg at 07/16/15 1417  . aspirin chewable tablet 81 mg  81 mg Oral Daily Auburn Bilberry, MD      . atorvastatin (LIPITOR) tablet 20 mg  20 mg Oral Daily Auburn Bilberry, MD   Stopped at 07/16/15 [phone]  . benzonatate (TESSALON) capsule 200 mg  200 mg Oral TID PRN Auburn Bilberry, MD      . cholecalciferol (VITAMIN D) tablet 1,000 Units  1,000 Units Oral Daily Auburn Bilberry, MD   Stopped at 07/16/15 0725  . docusate sodium (COLACE) capsule 100 mg  100 mg Oral Daily PRN Auburn Bilberry, MD      . donepezil (ARICEPT) tablet 10 mg  10 mg Oral  QHS Auburn BilberryShreyang Patel, MD   10 mg at 07/15/15 2020  . DULoxetine (CYMBALTA) DR capsule 60 mg  60 mg Oral Daily Auburn BilberryShreyang Patel, MD   Stopped at 07/16/15 0725  . finasteride (PROSCAR) tablet 5 mg  5 mg Oral Daily Auburn BilberryShreyang Patel, MD   Stopped at 07/16/15 0725  . insulin aspart (novoLOG) injection 0-9 Units  0-9 Units Subcutaneous TID WC Auburn BilberryShreyang Patel, MD   0 Units at 07/15/15 1328  . levofloxacin (LEVAQUIN) IVPB 500 mg  500 mg Intravenous Once Cindi CarbonMary M Swayne, [facility]       Followed by  . [START ON 07/17/2015] Levofloxacin (LEVAQUIN) IVPB 250 mg  250 mg Intravenous Q24H Jodelle RedMary M ChalmersSwayne, [facility]PH      . memantine (NAMENDA XR) 24 hr capsule 28 mg  28 mg Oral Daily Auburn BilberryShreyang Patel, MD   Stopped at 07/16/15 [phone]  . metoprolol tartrate (LOPRESSOR)  tablet 25 mg  25 mg Oral BID Auburn BilberryShreyang Patel, MD   Stopped at 07/16/15 [phone]  . ondansetron (ZOFRAN) tablet 4 mg  4 mg Oral Q6H PRN Auburn BilberryShreyang Patel, MD       Or  . ondansetron (ZOFRAN) injection 4 mg  4 mg Intravenous Q6H PRN Auburn BilberryShreyang Patel, MD      . pregabalin (LYRICA) capsule 75 mg  75 mg Oral BID Auburn BilberryShreyang Patel, MD   Stopped at 07/16/15 [phone]  . rivaroxaban (XARELTO) tablet 20 mg  20 mg Oral Q supper Auburn BilberryShreyang Patel, MD   20 mg at 07/15/15 1552  . sodium chloride flush (NS) 0.9 % injection 3 mL  3 mL Intravenous Q12H Auburn BilberryShreyang Patel, MD   3 mL at 07/15/15 2129  . timolol (TIMOPTIC) 0.5 % ophthalmic solution 1 drop  1 drop Both Eyes BID Auburn BilberryShreyang Patel, MD   1 drop at 07/15/15 2128     Discharge Medications: Please see discharge summary for a list of discharge medications.  Relevant Imaging Results:  Relevant Lab Results:   Additional Information SS: [id number]  York SpanielMonica Haniah Penny, LCSW

## 2015-07-16 NOTE — Care Management Important Message (Signed)
Important Message  Patient Details  Name: Sampson SiJames C Edmonston MRN: [id number] Date of Birth: 05/13/1931   Medicare Important Message Given:  Yes    Gwenette GreetBrenda S Tricha Ruggirello, RN 07/16/2015, 8:44 AM

## 2015-07-16 NOTE — Evaluation (Signed)
Clinical/Bedside Swallow Evaluation Patient Details  Name: William Byrd MRN: [id number] Date of Birth: 12/12/1931  Today's Date: 07/16/2015 Time: SLP Start Time (ACUTE ONLY): 0935 SLP Stop Time (ACUTE ONLY): 1030 SLP Time Calculation (min) (ACUTE ONLY): 55 min  Past Medical History:  Past Medical History  Diagnosis Date  . Dementia   . Diabetes mellitus without complication (HCC)   . Hypertension   . Arthritis   . Chronic kidney disease   . Peripheral vascular disease (HCC)   . Generalized weakness   . Degeneration of lumbar or lumbosacral intervertebral disc   . Spinal stenosis   . Cervicalgia   . Hypercholesteremia   . Glaucoma   . DVT (deep venous thrombosis) (HCC)   . Chronic systolic CHF (congestive heart failure) (HCC)   . Peripheral polyneuropathy (HCC)   . Background retinopathy   . Long-term insulin use (HCC)    Past Surgical History:  Past Surgical History  Procedure Laterality Date  . C-spine fusion N/A    HPI:  Pt is a 82 y.o. male with a known history of Dementia, diabetes type 2, hypertension, chronic kidney disease, peripheral vascular disease, hyperlipidemia, chronic systolic CHF, urinary incontenence, and history of DVT who is brought in by his wife from home due to altered mental status. She reports that patient has not been acting right since Friday. She reports that he's been very weak and unable to get up. He does use a wheelchair at home and can usually feed himself. He also is has not been talking much. He has not been eating much. Due to his change in condition, EMS was called. When patient arrived here he was noted to have urinary tract infection and a chest x-ray suggestive of pneumonia. He is able to communicate his wants and needs; oriented to self and that he is in the hospital. He followed instructions given cues; feeds self w/ setup assist d/t LUE weakness. Suspect pt may be at his baseline re: Cognitive status presentation per wife's description. Pt  has been eating a regular diet at home per wife/pt w/ no reports of swallowing problems.    Assessment / Plan / Recommendation Clinical Impression  Pt appeared to safely tolerate trials of thin liquids via straw and trials of puree/solid foods w/ no overt s/s of aspiration noted. Pt consumed several trials feeding self given setup assistance w/ no coughing occuring; oral phase appeared wfl w/ adequate oral clearing after timely transfer and swallowing noted. Pt fed self using fairly appropriate aspiration precautions; education on general aspiration precautions including more monitoring of pt's self-feeding, smaller bite sizes of foods, and moistening foods more discussed. Pt appeared to respond to verbal cues adequately as well. Discussed strategy of using puree or ice cream when swallowing pills if any s/s of dysphagia noted when swallowing pills w/ liquids. Pt appears at reduced risk for aspiration at this time but will always be at a min. increased risk for aspiration to occur sec. to declined Cognitive status(Dementia). Recommend a regular diet (wife will cut meats) w/ general aspiration precautions and monitoring during meals as necessary. No further skilled ST services indicated as pt appears at his baseline w/ oral intake. Pt/Wife agreed.     Aspiration Risk   (reduced)    Diet Recommendation  regular/mech soft (cut meats, moistened); thin liquids; general aspiration precautions and assistance w/ tray setup at meals.  Medication Administration: Whole meds with puree (as necessary)    Other  Recommendations Recommended Consults:  (dietician) Oral  Care Recommendations: Oral care BID;Staff/trained caregiver to provide oral care   Follow up Recommendations  None    Frequency and Duration            Prognosis Prognosis for Safe Diet Advancement: Good Barriers to Reach Goals: Cognitive deficits      Swallow Study   General Date of Onset: 07/15/15 HPI: Pt is a 82 y.o. male with a known  history of Dementia, diabetes type 2, hypertension, chronic kidney disease, peripheral vascular disease, hyperlipidemia, chronic systolic CHF, urinary incontenence, and history of DVT who is brought in by his wife from home due to altered mental status. She reports that patient has not been acting right since Friday. She reports that he's been very weak and unable to get up. He does use a wheelchair at home and can usually feed himself. He also is has not been talking much. He has not been eating much. Due to his change in condition, EMS was called. When patient arrived here he was noted to have urinary tract infection and a chest x-ray suggestive of pneumonia. He is able to communicate his wants and needs; oriented to self and that he is in the hospital. He followed instructions given cues; feeds self w/ setup assist d/t LUE weakness. Suspect pt may be at his baseline re: Cognitive status presentation per wife's description. Pt has been eating a regular diet at home per wife/pt w/ no reports of swallowing problems.  Type of Study: Bedside Swallow Evaluation Previous Swallow Assessment: x2 in 2016 during admissions; no significant deficits noted then Diet Prior to this Study: Regular;Thin liquids Temperature Spikes Noted: No (wbc not elevated this admission) Respiratory Status: Room air History of Recent Intubation: No Behavior/Cognition: Alert;Cooperative;Pleasant mood;Distractible;Requires cueing Oral Cavity Assessment: Within Functional Limits Oral Care Completed by SLP: Recent completion by staff Oral Cavity - Dentition: Adequate natural dentition Vision: Functional for self-feeding Self-Feeding Abilities: Able to feed self;Needs assist;Needs set up Patient Positioning: Upright in bed Baseline Vocal Quality: Normal;Low vocal intensity Volitional Cough: Strong Volitional Swallow: Able to elicit    Oral/Motor/Sensory Function Overall Oral Motor/Sensory Function: Within functional limits   Ice  Chips Ice chips: Within functional limits Presentation: Spoon (fed; x2 trials)   Thin Liquid Thin Liquid: Within functional limits Presentation: Self Fed;Straw (w/ precautions reviewed; ~3 ozs) Other Comments: uses straws at home to drink per wife    Nectar Thick Nectar Thick Liquid: Not tested   Honey Thick Honey Thick Liquid: Not tested   Puree Puree: Within functional limits Presentation: Spoon;Self Fed (~4 ozs)   Solid   GO   Solid: Within functional limits Presentation: Self Fed (biscuit sandwich)       Jerilynn Som, MS, CCC-SLP  William Byrd 07/16/2015,10:34 AM

## 2015-07-17 LAB — URINE CULTURE

## 2015-07-17 LAB — GLUCOSE, CAPILLARY
GLUCOSE-CAPILLARY: 206 mg/dL — AB (ref 65–99)
GLUCOSE-CAPILLARY: 399 mg/dL — AB (ref 65–99)
Glucose-Capillary: 290 mg/dL — ABNORMAL HIGH (ref 65–99)
Glucose-Capillary: 369 mg/dL — ABNORMAL HIGH (ref 65–99)

## 2015-07-17 MED ORDER — INSULIN ASPART 100 UNIT/ML ~~LOC~~ SOLN
0.0000 [IU] | Freq: Every day | SUBCUTANEOUS | Status: DC
  Administered 2015-07-17: 5 [IU] via SUBCUTANEOUS
  Filled 2015-07-17 (×2): qty 5

## 2015-07-17 MED ORDER — INSULIN DETEMIR 100 UNIT/ML ~~LOC~~ SOLN
12.0000 [IU] | Freq: Every day | SUBCUTANEOUS | Status: DC
  Administered 2015-07-17: 12 [IU] via SUBCUTANEOUS
  Filled 2015-07-17 (×2): qty 0.12

## 2015-07-17 MED ORDER — HYDRALAZINE HCL 25 MG PO TABS
25.0000 mg | ORAL_TABLET | Freq: Three times a day (TID) | ORAL | Status: DC
  Administered 2015-07-17 – 2015-07-18 (×4): 25 mg via ORAL
  Filled 2015-07-17 (×4): qty 1

## 2015-07-17 MED ORDER — ENSURE ENLIVE PO LIQD
237.0000 mL | Freq: Two times a day (BID) | ORAL | Status: DC
  Administered 2015-07-17 – 2015-07-18 (×3): 237 mL via ORAL

## 2015-07-17 MED ORDER — INSULIN ASPART 100 UNIT/ML ~~LOC~~ SOLN
0.0000 [IU] | Freq: Three times a day (TID) | SUBCUTANEOUS | Status: DC
  Administered 2015-07-17: 9 [IU] via SUBCUTANEOUS
  Administered 2015-07-18 (×2): 7 [IU] via SUBCUTANEOUS
  Filled 2015-07-17: qty 9
  Filled 2015-07-17 (×2): qty 7

## 2015-07-17 NOTE — Progress Notes (Signed)
Initial Nutrition Assessment  DOCUMENTATION CODES:   Non-severe (moderate) malnutrition in context of chronic illness  INTERVENTION:  -Monitor intake and cater to pt preferences -Recommend Ensure Enlive po BID, each supplement provides 350 kcal and 20 grams of protein    NUTRITION DIAGNOSIS:   Malnutrition related to chronic illness as evidenced by moderate depletion of body fat, moderate depletions of muscle mass.    GOAL:   Patient will meet greater than or equal to 90% of their needs    MONITOR:   PO intake, Supplement acceptance  REASON FOR ASSESSMENT:   Malnutrition Screening Tool    ASSESSMENT:      Pt admitted with AMS, UTI, pneumonia  Past Medical History  Diagnosis Date  . Dementia   . Diabetes mellitus without complication (HCC)   . Hypertension   . Arthritis   . Chronic kidney disease   . Peripheral vascular disease (HCC)   . Generalized weakness   . Degeneration of lumbar or lumbosacral intervertebral disc   . Spinal stenosis   . Cervicalgia   . Hypercholesteremia   . Glaucoma   . DVT (deep venous thrombosis) (HCC)   . Chronic systolic CHF (congestive heart failure) (HCC)   . Peripheral polyneuropathy (HCC)   . Background retinopathy   . Long-term insulin use (HCC)    Wife reports ate most of dinner last night (chicken, collards, pinto beans) Wife reports intake good prior to admission.    Medications reviewed: aspart, colace Labs reviewed: FSBS 206, 242, 233, 185, creatinine 1.34  Nutrition-Focused physical exam completed. Findings are moderate fat depletion, mild/moderate muscle depletion, and no edema.     Diet Order:  Diet regular Room service appropriate?: Yes with Assist; Fluid consistency:: Thin  Skin:  Reviewed, no issues  Last BM:  6/4  Height:   Ht Readings from Last 1 Encounters:  07/15/15 5\' 7"  (1.702 m)    Weight: wife reports last wt of 179 pounds current wt of 173 lb 1 oz.  (3% wt loss in the last 7  months)  Wt Readings from Last 1 Encounters:  07/15/15 173 lb 1 oz (78.501 kg)    Ideal Body Weight:     BMI:  Body mass index is 27.1 kg/(m^2).  Estimated Nutritional Needs:   Kcal:  1950-2340 kcals/d.   Protein:  98-117 g/d  Fluid:  >/= 2 L/d  EDUCATION NEEDS:   No education needs identified at this time  Salvatrice Morandi B. Freida BusmanAllen, RD, LDN [phone] (pager) Weekend/On-Call pager [phone])

## 2015-07-17 NOTE — Care Management (Signed)
Admitted to [facility] with the diagnosis of CVA. Lives with wife, Luster LandsbergFannie [phone]). [facility], [facility] [address]. [facility] the past. [place] Walker and bedside commode in the home. [facility] in the past. Wife helps with care in the home. Fair appetite. Physical therapy evaluation completed. Recommends skilled nursing. Wife declines this plan at this time. Gwenette GreetBrenda S Jermey Closs RN MSN CCM Care Management [phone]

## 2015-07-17 NOTE — Clinical Social Work Note (Signed)
Clinical Social Work Assessment  Patient Details  Name: William Byrd MRN: [id number] Date of Birth: 05/28/1931  Date of referral:  07/17/15               Reason for consult:  Facility Placement                Permission sought to share information with:  Family Supports Permission granted to share information::  Yes, Verbal Permission Granted  Name::     Wife, Raymir Frommelt   Housing/Transportation Living arrangements for the past 2 months:  [place] of Information:  Patient, Spouse Patient Interpreter Needed:  None Criminal Activity/Legal Involvement Pertinent to Current Situation/Hospitalization:   No Significant Relationships:  Spouse Lives with:  Spouse Do you feel safe going back to the place where you live?  Yes Need for family participation in patient care:  Yes (Comment)  Care giving concerns:  No care giving concerns identified.   Social Worker assessment / plan:  CSW met with pt and wife to address consult for SNF. CSW introduced herself and explained role of social work. CSW also explained the process of discharging to SNF. Pt's wife shared that she would like to take pt home, as pt has been to SNF in the past and she feels that pt will do better at home. CSW updated RNCM. However, when MD rounded, pt's wife would like SNF. SNF search initiated, and CSW will follow up with bed offers. CSW will continue to follow.   Employment status:  Retired Forensic scientist:  Medicare PT Recommendations:  [place] / Referral to community resources:  [place]  Patient/Family's Response to care:  Pt's wife was appropriative of CSW support.   Patient/Family's Understanding of and Emotional Response to Diagnosis, Current Treatment, and Prognosis:  PT is recommending SNF, which per MD, pt's wife is in agreement.   Emotional Assessment Appearance:  Appears stated age Attitude/Demeanor/Rapport:  Other  (Approrpriate) Affect (typically observed):  Accepting, Pleasant Orientation:  Oriented to Self, Fluctuating Orientation (Suspected and/or reported Sundowners) Alcohol / Substance use:  Never Used Psych involvement (Current and /or in the community):  No (Comment)  Discharge Needs  Concerns to be addressed:  Adjustment to Illness Readmission within the last 30 days:  No Current discharge risk:  Chronically ill Barriers to Discharge:  Continued Medical Work up   [employer], LCSW 07/17/2015, 5:28 PM

## 2015-07-17 NOTE — Progress Notes (Signed)
Physical Therapy Treatment Patient Details Name: William Byrd MRN: [id number] DOB: 04/02/1931 Today's Date: 07/17/2015    History of Present Illness 82 y.o. male with a known history of Dementia, diabetes type 2, hypertension, chronic kidney disease, peripheral vascular disease, hyperlipidemia, chronic systolic CHF and history of DVT who is brought in by his wife due to altered mental status. She reports that patient has not been acting right since Friday. She reports that he's been very weak and unable to get up. He also is has not been talking much. He has not been eating much.     PT Comments    Pt able to progress to standing with max assist and B knees blocked but unable to come to full upright position (with 5 attempts).  Pt pleasant during session and did well participating with physical therapy.  Pt incontinent of urine requiring clean-up.  Will attempt to progress pt to transfer to chair next session as appropriate.   Follow Up Recommendations  SNF     Equipment Recommendations       Recommendations for Other Services       Precautions / Restrictions Precautions Precautions: Fall Restrictions Weight Bearing Restrictions: No    Mobility  Bed Mobility Overal bed mobility: Needs Assistance Bed Mobility: Supine to Sit;Sit to Supine;Rolling Rolling: Mod assist;Max assist (logroll to L x2 and to R x3)   Supine to sit: Max assist Sit to supine: Max assist   General bed mobility comments: pt requiring vc's for hand placement for bed mobility (including use of siderails for logrolling to get cleaned up in bed d/t incontinence); tactile cueing required for movement of LE's in/out of bed  Transfers Overall transfer level: Needs assistance Equipment used: None Transfers: Sit to/from Stand Sit to Stand: Max assist         General transfer comment: x5 trials (pt with partial stand x4 trials 1/4th to 1/2 stand; 90% upright stand on 5th attempt); pt requiring max cueing  for upright posture technique; B knees blocked with assist to place feet in correct position  Ambulation/Gait             General Gait Details: not appropriate at this time d/t difficulty standing upright   Stairs            Wheelchair Mobility    Modified Rankin (Stroke Patients Only)       Balance Overall balance assessment: Needs assistance Sitting-balance support: Bilateral upper extremity supported;Feet supported Sitting balance-Leahy Scale: Poor Sitting balance - Comments: pt fluctuating between close SBA to min assist for upright sitting (d/t posterior lean) and requiring vc's and tactile cues to assist with correcting sitting posture/balance                            Cognition Arousal/Alertness: Awake/alert Behavior During Therapy:  (confused; fluctuated between flat affect and laughing) Overall Cognitive Status: No family/caregiver present to determine baseline cognitive functioning (Oriented to self and DOB)                      Exercises      General Comments   Nursing cleared pt for participation in physical therapy.  Pt agreeable to PT session.      Pertinent Vitals/Pain Pain Assessment: Faces Faces Pain Scale: No hurt  Vitals stable and WFL throughout treatment session.    Home Living  Prior Function            PT Goals (current goals can now be found in the care plan section) Acute Rehab PT Goals Patient Stated Goal: wife reports goal to get back home once he gets stronger PT Goal Formulation: With family Time For Goal Achievement: 07/29/15 Potential to Achieve Goals: Fair Progress towards PT goals: Progressing toward goals    Frequency  Min 2X/week    PT Plan Current plan remains appropriate    Co-evaluation             End of Session Equipment Utilized During Treatment: Gait belt Activity Tolerance: Patient limited by fatigue Patient left: in bed;with call bell/phone  within reach;with bed alarm set;with SCD's reapplied     Time: 4098-1191 PT Time Calculation (min) (ACUTE ONLY): 28 min  Charges:  $Therapeutic Activity: 23-37 mins                    G CodesHendricks Limes 15-Aug-2015, 1:37 PM Hendricks Limes, PT [phone]

## 2015-07-17 NOTE — Progress Notes (Signed)
[facility]  Patient Status: Active [facility] is providing the following services: PT  If patient discharges after hours, please call [phone].   Dimple CaseyJason E Hinton 07/17/2015, 10:32 AM

## 2015-07-17 NOTE — Progress Notes (Signed)
[facility] [place] [facility]                                                                                                                                                                                            Patient Demographics   William DienesJames Byrd, is a 82 y.o. male, DOB - 05/19/1931, WUJ:[id number]  Admit date - 07/15/2015   Admitting Physician Auburn BilberryShreyang Renly Roots, MD  Outpatient Primary MD for the patient is [name], [name], MD   LOS - 2  Subjective:  Urine culture with Klebsiella. Patient more awake and communicating denies any significant symptoms    Review of Systems:   CONSTITUTIONAL: No documented fever. Positive fatigue, positive weakness. No weight gain, no weight loss.  EYES: No blurry or double vision.  ENT: No tinnitus. No postnasal drip. No redness of the oropharynx.  RESPIRATORY: No cough, no wheeze, no hemoptysis. No dyspnea.  CARDIOVASCULAR: No chest pain. No orthopnea. No palpitations. No syncope.  GASTROINTESTINAL: No nausea, no vomiting or diarrhea. No abdominal pain. No melena or hematochezia.  GENITOURINARY: No dysuria or hematuria.  ENDOCRINE: No polyuria or nocturia. No heat or cold intolerance.  HEMATOLOGY: No anemia. No bruising. No bleeding.  INTEGUMENTARY: No rashes. No lesions.  MUSCULOSKELETAL: No arthritis. No swelling. No gout.  NEUROLOGIC: No numbness, tingling, or ataxia. No seizure-type activity.  PSYCHIATRIC: No anxiety. No insomnia. No ADD.    Vitals:   Filed Vitals:   07/16/15 1630 07/16/15 2120 07/17/15 0450 07/17/15 0856  BP: 161/71 182/79 183/79 187/89  Pulse: 97 95 72 70  Temp: 98.2 F (36.8 C) 97.9 F (36.6 C) 98.2 F (36.8 C)   TempSrc: Oral Oral Oral   Resp: 18 18 17 18   Height:      Weight:      SpO2: 100% 100% 100% 100%    Wt Readings from Last 3 Encounters:  07/15/15 78.501 kg (173 lb 1 oz)  12/28/14 81.194 kg (179 lb)  10/03/14 79.379 kg (175 lb)     Intake/Output Summary (Last 24  hours) at 07/17/15 1221 Last data filed at 07/17/15 0857  Gross per 24 hour  Intake 801.33 ml  Output      0 ml  Net 801.33 ml    Physical Exam:   GENERAL: Pleasant-appearing in no apparent distress.  HEAD, EYES, EARS, NOSE AND THROAT: Atraumatic, normocephalic. Extraocular muscles are intact. Pupils equal and reactive to light. Sclerae anicteric. No conjunctival injection. No oro-pharyngeal erythema.  NECK: Supple. There is no jugular venous distention. No bruits, no lymphadenopathy, no thyromegaly.  HEART: Regular rate and rhythm,. No murmurs, no rubs, no  clicks.  LUNGS: Clear to auscultation bilaterally. No rales or rhonchi. No wheezes.  ABDOMEN: Soft, flat, nontender, nondistended. Has good bowel sounds. No hepatosplenomegaly appreciated.  EXTREMITIES: No evidence of any cyanosis, clubbing, or peripheral edema.  +2 pedal and radial pulses bilaterally.  NEUROLOGIC: The patient is alert, awake, and oriented x3 with no focal motor or sensory deficits appreciated bilaterally.  SKIN: Moist and warm with no rashes appreciated.  Psych: Not anxious, depressed LN: No inguinal LN enlargement    Antibiotics   Anti-infectives    Start     Dose/Rate Route Frequency Ordered Stop   07/17/15 1000  Levofloxacin (LEVAQUIN) IVPB 250 mg     250 mg 50 mL/hr over 60 Minutes Intravenous Every 24 hours 07/15/15 1039     07/16/15 1000  azithromycin (ZITHROMAX) 500 mg in dextrose 5 % 250 mL IVPB  Status:  Discontinued     500 mg 250 mL/hr over 60 Minutes Intravenous Every 24 hours 07/15/15 0635 07/15/15 0843   07/16/15 1000  levofloxacin (LEVAQUIN) IVPB 500 mg     500 mg 100 mL/hr over 60 Minutes Intravenous  Once 07/15/15 1039 07/16/15 1807   07/16/15 0900  cefTRIAXone (ROCEPHIN) 1 g in dextrose 5 % 50 mL IVPB  Status:  Discontinued     1 g 100 mL/hr over 30 Minutes Intravenous Every 24 hours 07/15/15 0635 07/15/15 0843   07/16/15 0000  levofloxacin (LEVAQUIN) IVPB 500 mg  Status:  Discontinued      500 mg 100 mL/hr over 60 Minutes Intravenous Every 24 hours 07/15/15 0831 07/15/15 1038   07/15/15 0845  levofloxacin (LEVAQUIN) IVPB 500 mg  Status:  Discontinued     500 mg 100 mL/hr over 60 Minutes Intravenous Every 24 hours 07/15/15 0830 07/15/15 0831   07/15/15 0600  cefTRIAXone (ROCEPHIN) 2 g in dextrose 5 % 50 mL IVPB     2 g 100 mL/hr over 30 Minutes Intravenous  Once 07/15/15 0559 07/15/15 0658   07/15/15 0600  azithromycin (ZITHROMAX) 500 mg in dextrose 5 % 250 mL IVPB     500 mg 250 mL/hr over 60 Minutes Intravenous  Once 07/15/15 0559 07/15/15 0809      Medications   Scheduled Meds: . amLODipine  10 mg Oral Daily  . aspirin  81 mg Oral Daily  . atorvastatin  20 mg Oral Daily  . cholecalciferol  1,000 Units Oral Daily  . donepezil  10 mg Oral QHS  . DULoxetine  60 mg Oral Daily  . feeding supplement (ENSURE ENLIVE)  237 mL Oral BID BM  . finasteride  5 mg Oral Daily  . hydrALAZINE  25 mg Oral Q8H  . insulin aspart  0-5 Units Subcutaneous QHS  . insulin aspart  0-9 Units Subcutaneous TID WC  . levofloxacin (LEVAQUIN) IV  250 mg Intravenous Q24H  . memantine  28 mg Oral Daily  . metoprolol tartrate  25 mg Oral BID  . pregabalin  75 mg Oral BID  . rivaroxaban  20 mg Oral Q supper  . sodium chloride flush  3 mL Intravenous Q12H  . timolol  1 drop Both Eyes BID   Continuous Infusions:   PRN Meds:.acetaminophen **OR** acetaminophen, benzonatate, docusate sodium, ondansetron **OR** ondansetron (ZOFRAN) IV   Data Review:   Micro Results Recent Results (from the past 240 hour(s))  Urine culture     Status: Abnormal   Collection Time: 07/15/15  4:57 AM  Result Value Ref Range Status   Specimen Description URINE,  CLEAN CATCH  Final   Special Requests NONE  Final   Culture >=100,000 COLONIES/mL KLEBSIELLA SPECIES (A)  Final   Report Status 07/17/2015 FINAL  Final   Organism ID, Bacteria KLEBSIELLA SPECIES (A)  Final      Susceptibility   Klebsiella species -  MIC*    AMPICILLIN 16 RESISTANT Resistant     CEFAZOLIN <=4 SENSITIVE Sensitive     CEFTRIAXONE <=1 SENSITIVE Sensitive     CIPROFLOXACIN <=0.25 SENSITIVE Sensitive     GENTAMICIN <=1 SENSITIVE Sensitive     IMIPENEM <=0.25 SENSITIVE Sensitive     NITROFURANTOIN <=16 SENSITIVE Sensitive     TRIMETH/SULFA <=20 SENSITIVE Sensitive     AMPICILLIN/SULBACTAM 4 SENSITIVE Sensitive     PIP/TAZO <=4 SENSITIVE Sensitive     Extended ESBL NEGATIVE Sensitive     * >=100,000 COLONIES/mL KLEBSIELLA SPECIES  Culture, blood (Routine x 2)     Status: None (Preliminary result)   Collection Time: 07/15/15  4:59 AM  Result Value Ref Range Status   Specimen Description BLOOD LEFT WRIST  Final   Special Requests   Final    BOTTLES DRAWN AEROBIC AND ANAEROBIC 15CCAERO,10CCANA   Culture NO GROWTH 1 DAY  Final   Report Status PENDING  Incomplete  Urine culture     Status: Abnormal (Preliminary result)   Collection Time: 07/15/15  4:59 AM  Result Value Ref Range Status   Specimen Description URINE, RANDOM  Final   Special Requests NONE  Final   Culture >=100,000 COLONIES/mL KLEBSIELLA PNEUMONIAE (A)  Final   Report Status PENDING  Incomplete   Organism ID, Bacteria KLEBSIELLA PNEUMONIAE (A)  Final      Susceptibility   Klebsiella pneumoniae - MIC*    AMPICILLIN 16 RESISTANT Resistant     CEFAZOLIN <=4 SENSITIVE Sensitive     CEFTRIAXONE <=1 SENSITIVE Sensitive     CIPROFLOXACIN <=0.25 SENSITIVE Sensitive     GENTAMICIN <=1 SENSITIVE Sensitive     IMIPENEM <=0.25 SENSITIVE Sensitive     NITROFURANTOIN 32 SENSITIVE Sensitive     TRIMETH/SULFA <=20 SENSITIVE Sensitive     AMPICILLIN/SULBACTAM 4 SENSITIVE Sensitive     PIP/TAZO <=4 SENSITIVE Sensitive     Extended ESBL NEGATIVE Sensitive     * >=100,000 COLONIES/mL KLEBSIELLA PNEUMONIAE  Culture, blood (Routine x 2)     Status: None (Preliminary result)   Collection Time: 07/15/15  5:50 AM  Result Value Ref Range Status   Specimen Description BLOOD  RIGHT FOREARM  Final   Special Requests   Final    BOTTLES DRAWN AEROBIC AND ANAEROBIC 15CCAERO,15CCANA   Culture NO GROWTH 1 DAY  Final   Report Status PENDING  Incomplete    Radiology Reports Dg Chest 2 View  07/15/2015  CLINICAL DATA:  Fever and weakness. EXAM: CHEST  2 VIEW COMPARISON:  12/28/2014 FINDINGS: Streaky opacity in the retrocardiac lung without convincing volume loss. No edema, effusion, or air leak. Normal heart size and mediastinal contours. Calcified granulomas in the spleen. IMPRESSION: Left lower lobe atelectasis or pneumonia. Electronically Signed   By: Marnee Spring M.D.   On: 07/15/2015 05:43   Ct Head Wo Contrast  07/15/2015  CLINICAL DATA:  Altered mental status and increased confusion beginning yesterday. EXAM: CT HEAD WITHOUT CONTRAST TECHNIQUE: Contiguous axial images were obtained from the base of the skull through the vertex without intravenous contrast. COMPARISON:  12/28/2014 FINDINGS: There is no evidence of intracranial hemorrhage, brain edema, or other signs of acute infarction. There  is no evidence of intracranial mass lesion or mass effect. No abnormal extraaxial fluid collections are identified. Central pattern of cerebral atrophy and mild chronic small vessel disease remains stable in appearance. Ventricles are stable in size. No skull abnormality identified. IMPRESSION: No acute intracranial abnormality. Stable cerebral atrophy and chronic small vessel disease. Electronically Signed   By: Myles Rosenthal M.D.   On: 07/15/2015 09:21   Mr Brain Wo Contrast  07/15/2015  CLINICAL DATA:  Altered mental status for 3 days. Increased weakness. Decreased walking. Decreased appetite. EXAM: MRI HEAD WITHOUT CONTRAST TECHNIQUE: Multiplanar, multiecho pulse sequences of the brain and surrounding structures were obtained without intravenous contrast. COMPARISON:  CT head without contrast 07/15/2015. FINDINGS: The diffusion-weighted images demonstrate no evidence for acute or  subacute infarction. Acute hemorrhage or mass lesion is present. Moderate atrophy and white matter disease is present bilaterally. Extensive white matter disease and remote lacunar infarcts are present within the central pons. There dilated perivascular spaces throughout the basal ganglia. A remote lacunar infarct is present in the right cerebellum. Flow is present in the major intracranial arteries. Bilateral lens replacements are present. The globes and orbits are otherwise intact. The paranasal sinuses and the mastoid air cells are clear. Leftward nasal septal spurring is present. The nasal cavity is clear. Skullbase is within normal limits. Midline sagittal images are unremarkable. IMPRESSION: 1. No acute intracranial abnormality. 2. Moderate generalized atrophy and diffuse white matter disease likely reflects the sequela of chronic microvascular ischemia. 3. Diffuse white matter changes within the brainstem and remote lacunar infarcts of the central pons. 4. Remote lacunar infarct of the right cerebellum. Electronically Signed   By: Marin Roberts M.D.   On: 07/15/2015 14:02     CBC  Recent Labs Lab 07/15/15 0459 07/16/15 0519  WBC 8.6 5.0  HGB 10.4* 10.3*  HCT 30.0* 30.2*  PLT 241 206  MCV 90.5 92.9  MCH 31.4 31.6  MCHC 34.7 34.0  RDW 14.9* 14.9*  LYMPHSABS 0.6*  --   MONOABS 0.6  --   EOSABS 0.0  --   BASOSABS 0.2*  --     Chemistries   Recent Labs Lab 07/15/15 0459 07/16/15 0519  NA 138 139  K 4.4 3.9  CL 106 109  CO2 25 25  GLUCOSE 135* 84  BUN 23* 20  CREATININE 1.71* 1.34*  CALCIUM 8.8* 8.4*  AST 22  --   ALT 12*  --   ALKPHOS 70  --   BILITOT 0.6  --    ------------------------------------------------------------------------------------------------------------------ estimated creatinine clearance is 39.7 mL/min (by C-G formula based on Cr of  1.34). ------------------------------------------------------------------------------------------------------------------ No results for input(s): HGBA1C in the last 72 hours. ------------------------------------------------------------------------------------------------------------------ No results for input(s): CHOL, HDL, LDLCALC, TRIG, CHOLHDL, LDLDIRECT in the last 72 hours. ------------------------------------------------------------------------------------------------------------------ No results for input(s): TSH, T4TOTAL, T3FREE, THYROIDAB in the last 72 hours.  Invalid input(s): FREET3 ------------------------------------------------------------------------------------------------------------------ No results for input(s): VITAMINB12, FOLATE, FERRITIN, TIBC, IRON, RETICCTPCT in the last 72 hours.  Coagulation profile No results for input(s): INR, PROTIME in the last 168 hours.  No results for input(s): DDIMER in the last 72 hours.  Cardiac Enzymes No results for input(s): CKMB, TROPONINI, MYOGLOBIN in the last 168 hours.  Invalid input(s): CK ------------------------------------------------------------------------------------------------------------------ Invalid input(s): POCBNP    Assessment & Plan   IMPRESSION AND PLAN: Patient is a 82 year old African-American male with altered mental status  1. Acute encephalopathy: Due to urinary tract infection and pneumonia Continue IV LevaquinOne more day switched to oral tomorrow  2. Urinary tract infection due to Klebsiella oral Levaquin   3. Pneumonia.  Continue Levaquin symptoms improved   4. Old lacunar infarcts on MRI aspirin therapy  5. Diabetes type 2: ssi, blood sugars are elevated we'll start him on low-dose Levemir  6 History of DVT continue Xarelto  7. Chronic kidney disease monitor renal function is back fluid stopped  8. History of chronic diastolic CHF stop IV fluids currently compensated   9.  Miscellaneous DVT prophylaxis patient on Xarelto     Code Status Orders        Start     Ordered   07/15/15 0829  Full code   Continuous     07/15/15 0830    Code Status History    Date Active Date Inactive Code Status Order ID Comments User Context   12/28/2014  9:50 PM 12/31/2014  4:38 PM Full Code [id number]  Altamese Dilling, MD Inpatient   09/05/2014  4:09 AM 09/08/2014  6:11 PM Full Code [id number]  Crissie Figures, MD ED           Consults none   DVT Prophylaxis  xarelto  Lab Results  Component Value Date   PLT 206 07/16/2015     Time Spent in minutes    Greater than 50% of time spent in care coordination and counseling patient regarding the condition and plan of care.   Auburn Bilberry M.D on 07/17/2015 at 12:21 PM  Between 7am to 6pm - Pager - [phone]  After 6pm go to [email or web address] - password [email or web address] [facility]  [facility] Hospital[facility]   Office  [phone]

## 2015-07-18 LAB — BASIC METABOLIC PANEL
Anion gap: 7 (ref 5–15)
BUN: 32 mg/dL — ABNORMAL HIGH (ref 6–20)
CALCIUM: 8.8 mg/dL — AB (ref 8.9–10.3)
CO2: 22 mmol/L (ref 22–32)
CREATININE: 1.56 mg/dL — AB (ref 0.61–1.24)
Chloride: 105 mmol/L (ref 101–111)
GFR calc Af Amer: 46 mL/min — ABNORMAL LOW (ref >60)
GFR calc non Af Amer: 40 mL/min — ABNORMAL LOW (ref >60)
GLUCOSE: 315 mg/dL — AB (ref 65–99)
Potassium: 3.8 mmol/L (ref 3.5–5.1)
Sodium: 134 mmol/L — ABNORMAL LOW (ref 135–145)

## 2015-07-18 LAB — GLUCOSE, CAPILLARY
Glucose-Capillary: 309 mg/dL — ABNORMAL HIGH (ref 65–99)
Glucose-Capillary: 335 mg/dL — ABNORMAL HIGH (ref 65–99)

## 2015-07-18 LAB — URINE CULTURE

## 2015-07-18 MED ORDER — HYDRALAZINE HCL 25 MG PO TABS: 25.0000 mg | ORAL_TABLET | Freq: Three times a day (TID) | ORAL | Status: DC

## 2015-07-18 MED ORDER — LEVOFLOXACIN 250 MG PO TABS: 250.0000 mg | ORAL_TABLET | Freq: Every day | ORAL | Status: AC

## 2015-07-18 MED ORDER — ASPIRIN 81 MG PO CHEW: 81.0000 mg | CHEWABLE_TABLET | Freq: Every day | ORAL | Status: DC

## 2015-07-18 MED ORDER — LEVOFLOXACIN 500 MG PO TABS: 250.0000 mg | ORAL_TABLET | Freq: Every day | ORAL | Status: DC

## 2015-07-18 MED ORDER — ENSURE ENLIVE PO LIQD: 237.0000 mL | Freq: Two times a day (BID) | ORAL | Status: DC

## 2015-07-18 NOTE — Progress Notes (Signed)
[facility] order. [facility]. EMS called for transport.

## 2015-07-18 NOTE — Discharge Instructions (Signed)
°  DIET:  °Cardiac diet ° °DISCHARGE CONDITION:  °Stable ° °ACTIVITY:  °Activity as tolerated ° °OXYGEN:  °Home Oxygen: No. °  °Oxygen Delivery: room air ° °DISCHARGE LOCATION:  °snf ° ° °ADDITIONAL DISCHARGE INSTRUCTION: ° ° °If you experience worsening of your admission symptoms, develop shortness of breath, life threatening emergency, suicidal or homicidal thoughts you must seek medical attention immediately by calling 911 or calling your MD immediately  if symptoms less severe. ° °You Must read complete instructions/literature along with all the possible adverse reactions/side effects for all the Medicines you take and that have been prescribed to you. Take any new Medicines after you have completely understood and accpet all the possible adverse reactions/side effects.  ° °Please note ° °You were cared for by a hospitalist during your hospital stay. If you have any questions about your discharge medications or the care you received while you were in the hospital after you are discharged, you can call the unit and asked to speak with the hospitalist on call if the hospitalist that took care of you is not available. Once you are discharged, your primary care physician will handle any further medical issues. Please note that NO REFILLS for any discharge medications will be authorized once you are discharged, as it is imperative that you return to your primary care physician (or establish a relationship with a primary care physician if you do not have one) for your aftercare needs so that they can reassess your need for medications and monitor your lab values. ° ° °

## 2015-07-18 NOTE — Clinical Social Work Placement (Signed)
   CLINICAL SOCIAL WORK PLACEMENT  NOTE  Date:  07/18/2015  Patient Details  Name: William Byrd MRN: [id number] Date of Birth: 12/03/1931  Clinical Social Work is seeking post-discharge placement for this patient at the Skilled  Nursing Facility level of care (*CSW will initial, date and re-position this form in  chart as items are completed):  Yes   Patient/family provided with [place] Clinical Social Work Department's list of facilities offering this level of care within the geographic area requested by the patient (or if unable, by the patient's family).  Yes   Patient/family informed of their freedom to choose among providers that offer the needed level of care, that participate in Medicare, Medicaid or managed care program needed by the patient, have an available bed and are willing to accept the patient.  Yes   Patient/family informed of [facility]'s ownership interest in [facility] and [facility], as well as of the fact that they are under no obligation to receive care at these facilities.  PASRR submitted to EDS on       PASRR number received on       Existing PASRR number confirmed on 07/17/15     FL2 transmitted to all facilities in geographic area requested by pt/family on 07/17/15     FL2 transmitted to all facilities within larger geographic area on       Patient informed that his/her managed care company has contracts with or will negotiate with certain facilities, including the following:        Yes   Patient/family informed of bed offers received.  [facility]     Physician recommends and patient chooses bed at  [facility](SNF)    [facility].  [facility]      Patient family notified on 07/18/15 of transfer.  Name of family member notified:  Pt's wife, [facility]     PHYSICIAN       Additional Comment:     _______________________________________________ William QuerySarah Adrea Sherpa, LCSW 07/18/2015, 1:05 PM

## 2015-07-18 NOTE — Care Management Important Message (Signed)
Important Message  Patient Details  Name: William Byrd MRN: [id number] Date of Birth: 06/17/1931   Medicare Important Message Given:  Yes    Gwenette GreetBrenda S Joycelyn Liska, RN 07/18/2015, 8:59 AM

## 2015-07-18 NOTE — Progress Notes (Signed)
Inpatient Diabetes Program Recommendations  AACE/ADA: New Consensus Statement on Inpatient Glycemic Control (2015)  Target Ranges:  Prepandial:   less than 140 mg/dL      Peak postprandial:   less than 180 mg/dL (1-2 hours)      Critically ill patients:  140 - 180 mg/dL    Review of Glycemic Control:  Results for William Byrd, Jaishaun C (MRN [id number]) as of 07/18/2015 10:59  Ref. Range 07/17/2015 07:48 07/17/2015 12:12 07/17/2015 17:02 07/17/2015 21:48 07/18/2015 08:42  Glucose-Capillary Latest Ref Range: 65-99 mg/dL 147206 (H) 829290 (H) 562369 (H) 399 (H) 309 (H)    Diabetes history: Type 2 diabetes Outpatient Diabetes medications: Lantus 20 units q HS, Humalog 5-15 units tid with meals, Tradjenta 5 mg daily Current orders for Inpatient glycemic control:  Levemir 12 units daily, Novolog sensitive tid with meals and HS  Inpatient Diabetes Program Recommendations:    Please consider d/c of Levemir and restart home dose of Lantus 20 units q HS.  Also, may consider adding Novolog meal coverage 4 units tid with meals.    Thanks, Beryl MeagerJenny Mihira Tozzi, RN, BC-ADM Inpatient Diabetes Coordinator Pager [phone] (8a-5p)

## 2015-07-18 NOTE — Clinical Social Work Note (Signed)
[facility] by [facility]'s wife. Pt and wife are agreeable to discharge plan. Facility has received discharge information and is ready to admit pt. RN called report and EMS will provide transportation to facility. CSW is signing off as no further needs identified.   Dede QuerySarah Shiniqua Groseclose, MSW, LCSW  Clinical Social Worker  [phone]

## 2015-07-18 NOTE — Progress Notes (Signed)
Pharmacy Antibiotic Note  William Byrd is a 82 y.o. male admitted on 07/15/2015 with pneumonia and UTI.  Pharmacy has been consulted for levofloxacin dosing.  This is day #4 of antibiotics  Plan: Continue levofloxacin 250 mg daily. Patient already received IV dose for today. Will change to PO beginning with tomorrows dose.  Height: 5\' 7"  (170.2 cm) Weight: 173 lb 1 oz (78.501 kg) IBW/kg (Calculated) : 66.1  Temp (24hrs), Avg:97.9 F (36.6 C), Min:97.7 F (36.5 C), Max:98.1 F (36.7 C)   Recent Labs Lab 07/15/15 0459 07/15/15 1047 07/16/15 0519 07/18/15 0633  WBC 8.6  --  5.0  --   CREATININE 1.71*  --  1.34* 1.56*  LATICACIDVEN 1.1 1.4  --   --     Estimated Creatinine Clearance: 34.1 mL/min (by C-G formula based on Cr of 1.56).    Allergies  Allergen Reactions  . Fosinopril Other (See Comments)    Reaction: Hyperkalemia    Antimicrobials this admission: CTX & azithromycin 6/4 in ED levofloxacin 6/5 >>   Dose adjustments this admission:  Microbiology results: 6/4 BCx: No growth 2 days 6/4 UCx: >100K Klebsiella pneumoniae  Thank you for allowing pharmacy to be a part of this patient's care.  Cindi CarbonMary M Mikhaila Roh, PharmD Clinical Pharmacist 07/18/2015 10:27 AM

## 2015-07-18 NOTE — Discharge Summary (Signed)
William Byrd, 82 y.o., DOB 1931/03/27, MRN [id number]. Admission date: 07/15/2015 Discharge Date 07/18/2015 Primary MD Marisue Ivan, MD Admitting Physician Auburn Bilberry, MD  Admission Diagnosis  Confusion [R41.0] Disorientation [R41.0] CVA (cerebral infarction) [I63.9] Left lower lobe pneumonia [J18.9] Acute cystitis without hematuria [N30.00] Fever, unspecified fever cause [R50.9]  Discharge Diagnosis   Active Problems:  Acute encephalopathy Remote lacunar infarct Urinary tract infection Left lower lobe pneumonia Dementia Diabetes type 2 Hypertension Chronic kidney disease Spinal stenosis Glaucoma History of DVT Chronic systolic CHF Peripheral polyneuropathy        Hospital Course patient is a 82 year old male with history of dementia, diabetes type 2, hypertension chronic kidney disease peripheral vascular disease hyperlipidemia chronic systolic CHF history of DVT was brought in by his wife due to altered mental status. Initially patient in the ER had a facial droop and there was a concern for CVA. He also was noted to have a urinary tract infection as well as pneumonia. He had a CT scan of the head which was negative. He underwent a MRI of the brain which showed an old lacunar infarct. Patient was started on IV antibiotics. Patient doing much better his mental status is back to baseline. He is very weak and deconditioned and is for rehabilitation.             Consults  None  Significant Tests:  See full reports for all details      Dg Chest 2 View  07/15/2015  CLINICAL DATA:  Fever and weakness. EXAM: CHEST  2 VIEW COMPARISON:  12/28/2014 FINDINGS: Streaky opacity in the retrocardiac lung without convincing volume loss. No edema, effusion, or air leak. Normal heart size and mediastinal contours. Calcified granulomas in the spleen. IMPRESSION: Left lower lobe atelectasis or pneumonia. Electronically Signed   By: Marnee Spring M.D.   On: 07/15/2015 05:43    Ct Head Wo Contrast  07/15/2015  CLINICAL DATA:  Altered mental status and increased confusion beginning yesterday. EXAM: CT HEAD WITHOUT CONTRAST TECHNIQUE: Contiguous axial images were obtained from the base of the skull through the vertex without intravenous contrast. COMPARISON:  12/28/2014 FINDINGS: There is no evidence of intracranial hemorrhage, brain edema, or other signs of acute infarction. There is no evidence of intracranial mass lesion or mass effect. No abnormal extraaxial fluid collections are identified. Central pattern of cerebral atrophy and mild chronic small vessel disease remains stable in appearance. Ventricles are stable in size. No skull abnormality identified. IMPRESSION: No acute intracranial abnormality. Stable cerebral atrophy and chronic small vessel disease. Electronically Signed   By: Myles Rosenthal M.D.   On: 07/15/2015 09:21   Mr Brain Wo Contrast  07/15/2015  CLINICAL DATA:  Altered mental status for 3 days. Increased weakness. Decreased walking. Decreased appetite. EXAM: MRI HEAD WITHOUT CONTRAST TECHNIQUE: Multiplanar, multiecho pulse sequences of the brain and surrounding structures were obtained without intravenous contrast. COMPARISON:  CT head without contrast 07/15/2015. FINDINGS: The diffusion-weighted images demonstrate no evidence for acute or subacute infarction. Acute hemorrhage or mass lesion is present. Moderate atrophy and white matter disease is present bilaterally. Extensive white matter disease and remote lacunar infarcts are present within the central pons. There dilated perivascular spaces throughout the basal ganglia. A remote lacunar infarct is present in the right cerebellum. Flow is present in the major intracranial arteries. Bilateral lens replacements are present. The globes and orbits are otherwise intact. The paranasal sinuses and the mastoid air cells are clear. Leftward nasal septal spurring is present. The nasal  cavity is clear. Skullbase is within  normal limits. Midline sagittal images are unremarkable. IMPRESSION: 1. No acute intracranial abnormality. 2. Moderate generalized atrophy and diffuse white matter disease likely reflects the sequela of chronic microvascular ischemia. 3. Diffuse white matter changes within the brainstem and remote lacunar infarcts of the central pons. 4. Remote lacunar infarct of the right cerebellum. Electronically Signed   By: Marin Robertshristopher  Mattern M.D.   On: 07/15/2015 14:02       Today   Subjective:   William Byrd  And feeling well and denies any complaints  Objective:   Blood pressure 179/72, pulse 66, temperature 97.9 F (36.6 C), temperature source Oral, resp. rate 17, height 5\' 7"  (1.702 m), weight 78.501 kg (173 lb 1 oz), SpO2 98 %.  .  Intake/Output Summary (Last 24 hours) at 07/18/15 1224 Last data filed at 07/17/15 1700  Gross per 24 hour  Intake    360 ml  Output      0 ml  Net    360 ml    Exam VITAL SIGNS: Blood pressure 179/72, pulse 66, temperature 97.9 F (36.6 C), temperature source Oral, resp. rate 17, height 5\' 7"  (1.702 m), weight 78.501 kg (173 lb 1 oz), SpO2 98 %.  GENERAL:  82 y.o.-year-old patient lying in the bed with no acute distress.  EYES: Pupils equal, round, reactive to light and accommodation. No scleral icterus. Extraocular muscles intact.  HEENT: Head atraumatic, normocephalic. Oropharynx and nasopharynx clear.  NECK:  Supple, no jugular venous distention. No thyroid enlargement, no tenderness.  LUNGS: Normal breath sounds bilaterally, no wheezing, rales,rhonchi or crepitation. No use of accessory muscles of respiration.  CARDIOVASCULAR: S1, S2 normal. No murmurs, rubs, or gallops.  ABDOMEN: Soft, nontender, nondistended. Bowel sounds present. No organomegaly or mass.  EXTREMITIES: No pedal edema, cyanosis, or clubbing.  NEUROLOGIC: Cranial nerves II through XII are intact. Muscle strength 5/5 in all extremities. Sensation intact. Gait not checked.   PSYCHIATRIC: The patient is alert and oriented x 3.  SKIN: No obvious rash, lesion, or ulcer.   Data Review     CBC w Diff: Lab Results  Component Value Date   WBC 5.0 07/16/2015   WBC 3.8 09/19/2014   WBC 10.2 02/24/2014   HGB 10.3* 07/16/2015   HGB 10.1* 02/24/2014   HCT 30.2* 07/16/2015   HCT 29.9* 02/24/2014   PLT 206 07/16/2015   PLT 177 02/24/2014   LYMPHOPCT 7% 07/15/2015   LYMPHOPCT 8.4 02/24/2014   MONOPCT 7% 07/15/2015   MONOPCT 6.4 02/24/2014   EOSPCT 0% 07/15/2015   EOSPCT 1.0 02/24/2014   BASOPCT 3% 07/15/2015   BASOPCT 0.3 02/24/2014   CMP: Lab Results  Component Value Date   NA 134* 07/18/2015   NA 139 09/19/2014   NA 143 02/25/2014   K 3.8 07/18/2015   K 3.8 02/25/2014   CL 105 07/18/2015   CL 109* 02/25/2014   CO2 22 07/18/2015   CO2 27 02/25/2014   BUN 32* 07/18/2015   BUN 27* 09/19/2014   BUN 20* 02/25/2014   CREATININE 1.56* 07/18/2015   CREATININE 1.7* 09/19/2014   CREATININE 1.51* 02/25/2014   GLU 121 09/19/2014   PROT 7.6 07/15/2015   PROT 7.6 09/24/2013   ALBUMIN 2.9* 07/15/2015   ALBUMIN 3.0* 09/24/2013   BILITOT 0.6 07/15/2015   BILITOT 0.6 09/24/2013   ALKPHOS 70 07/15/2015   ALKPHOS 71 09/24/2013   AST 22 07/15/2015   AST 24 09/24/2013   ALT 12* 07/15/2015  ALT 31 09/24/2013  .  Micro Results Recent Results (from the past 240 hour(s))  Urine culture     Status: Abnormal   Collection Time: 07/15/15  4:57 AM  Result Value Ref Range Status   Specimen Description URINE, CLEAN CATCH  Final   Special Requests NONE  Final   Culture >=100,000 COLONIES/mL KLEBSIELLA SPECIES (A)  Final   Report Status 07/17/2015 FINAL  Final   Organism ID, Bacteria KLEBSIELLA SPECIES (A)  Final      Susceptibility   Klebsiella species - MIC*    AMPICILLIN 16 RESISTANT Resistant     CEFAZOLIN <=4 SENSITIVE Sensitive     CEFTRIAXONE <=1 SENSITIVE Sensitive     CIPROFLOXACIN <=0.25 SENSITIVE Sensitive     GENTAMICIN <=1 SENSITIVE Sensitive      IMIPENEM <=0.25 SENSITIVE Sensitive     NITROFURANTOIN <=16 SENSITIVE Sensitive     TRIMETH/SULFA <=20 SENSITIVE Sensitive     AMPICILLIN/SULBACTAM 4 SENSITIVE Sensitive     PIP/TAZO <=4 SENSITIVE Sensitive     Extended ESBL NEGATIVE Sensitive     * >=100,000 COLONIES/mL KLEBSIELLA SPECIES  Culture, blood (Routine x 2)     Status: None (Preliminary result)   Collection Time: 07/15/15  4:59 AM  Result Value Ref Range Status   Specimen Description BLOOD LEFT WRIST  Final   Special Requests   Final    BOTTLES DRAWN AEROBIC AND ANAEROBIC 15CCAERO,10CCANA   Culture NO GROWTH 2 DAYS  Final   Report Status PENDING  Incomplete  Urine culture     Status: Abnormal   Collection Time: 07/15/15  4:59 AM  Result Value Ref Range Status   Specimen Description URINE, RANDOM  Final   Special Requests NONE  Final   Culture >=100,000 COLONIES/mL KLEBSIELLA PNEUMONIAE (A)  Final   Report Status 07/18/2015 FINAL  Final   Organism ID, Bacteria KLEBSIELLA PNEUMONIAE (A)  Final      Susceptibility   Klebsiella pneumoniae - MIC*    AMPICILLIN 16 RESISTANT Resistant     CEFAZOLIN <=4 SENSITIVE Sensitive     CEFTRIAXONE <=1 SENSITIVE Sensitive     CIPROFLOXACIN <=0.25 SENSITIVE Sensitive     GENTAMICIN <=1 SENSITIVE Sensitive     IMIPENEM <=0.25 SENSITIVE Sensitive     NITROFURANTOIN 32 SENSITIVE Sensitive     TRIMETH/SULFA <=20 SENSITIVE Sensitive     AMPICILLIN/SULBACTAM 4 SENSITIVE Sensitive     PIP/TAZO <=4 SENSITIVE Sensitive     Extended ESBL NEGATIVE Sensitive     * >=100,000 COLONIES/mL KLEBSIELLA PNEUMONIAE  Culture, blood (Routine x 2)     Status: None (Preliminary result)   Collection Time: 07/15/15  5:50 AM  Result Value Ref Range Status   Specimen Description BLOOD RIGHT FOREARM  Final   Special Requests   Final    BOTTLES DRAWN AEROBIC AND ANAEROBIC 15CCAERO,15CCANA   Culture NO GROWTH 2 DAYS  Final   Report Status PENDING  Incomplete        Code Status Orders        Start      Ordered   07/15/15 0829  Full code   Continuous     07/15/15 0830    Code Status History    Date Active Date Inactive Code Status Order ID Comments User Context   12/28/2014  9:50 PM 12/31/2014  4:38 PM Full Code [id number]  Altamese Dilling, MD Inpatient   09/05/2014  4:09 AM 09/08/2014  6:11 PM Full Code [id number]  Crissie Figures, MD ED  Follow-up Information    Follow up with Marisue Ivan, MD In 2 weeks.   Specialty:  Family Medicine   Contact information:   [address] [facility] [place] [address] [phone]       Follow up with [facility] .   Specialties:  Skilled Holiday representative, [facility]isted [facility] Company secretary information:   [address] [place] [address] [phone]      Discharge Medications     Medication List    STOP taking these medications        insulin lispro 100 UNIT/ML injection  Commonly known as:  HUMALOG      TAKE these medications        acetaminophen 325 MG tablet  Commonly known as:  TYLENOL  Take 650 mg by mouth every 6 (six) hours as needed for mild pain, moderate pain, fever or headache.     amLODipine 10 MG tablet  Commonly known as:  NORVASC  Take 5 mg by mouth daily.     aspirin 81 MG chewable tablet  Chew 1 tablet (81 mg total) by mouth daily.     atorvastatin 20 MG tablet  Commonly known as:  LIPITOR  Take 20 mg by mouth daily.     benzonatate 200 MG capsule  Commonly known as:  TESSALON  Take 200 mg by mouth 3 (three) times daily as needed for cough.     cholecalciferol 1000 units tablet  Commonly known as:  VITAMIN D  Take 1,000 Units by mouth daily.     donepezil 10 MG tablet  Commonly known as:  ARICEPT  Take 10 mg by mouth at bedtime.     DULoxetine 60 MG capsule  Commonly known as:  CYMBALTA  Take 60 mg by mouth daily.     feeding supplement (ENSURE ENLIVE) Liqd  Take 237 mLs by mouth 2 (two) times daily between  meals.     finasteride 5 MG tablet  Commonly known as:  PROSCAR  Take 5 mg by mouth daily.     furosemide 20 MG tablet  Commonly known as:  LASIX  Take 20-40 mg by mouth 2 (two) times daily. Take 2 tablets in the morning. Take an additional tablet 6 hours later.     hydrALAZINE 25 MG tablet  Commonly known as:  APRESOLINE  Take 1 tablet (25 mg total) by mouth every 8 (eight) hours.     insulin glargine 100 UNIT/ML injection  Commonly known as:  LANTUS  Inject 20 Units into the skin at bedtime.     ketorolac 0.4 % Soln  Commonly known as:  ACULAR  Place 1 drop into both eyes 2 (two) times daily.     levofloxacin 250 MG tablet  Commonly known as:  LEVAQUIN  Take 1 tablet (250 mg total) by mouth daily.  Start taking on:  07/19/2015     linagliptin 5 MG Tabs tablet  Commonly known as:  TRADJENTA  Take 5 mg by mouth daily.     metoprolol tartrate 25 MG tablet  Commonly known as:  LOPRESSOR  Take 1 tablet (25 mg total) by mouth 2 (two) times daily.     NAMENDA XR 28 MG Cp24 24 hr capsule  Generic drug:  memantine  Take 28 mg by mouth daily.     pregabalin 75 MG capsule  Commonly known as:  LYRICA  Take 1 capsule (75 mg total) by mouth 2 (two) times daily.  PROSTATE SR 160-250 MG Caps  Take 1 capsule by mouth daily.     STOOL SOFTENER 100 MG capsule  Generic drug:  docusate sodium  Take 100 mg by mouth daily as needed for mild constipation or moderate constipation.     timolol 0.5 % ophthalmic solution  Commonly known as:  TIMOPTIC  Place 1 drop into both eyes 2 (two) times daily.     XARELTO 20 MG Tabs tablet  Generic drug:  rivaroxaban  Take 1 tablet by mouth daily.           Total Time in preparing paper work, data evaluation and todays exam - 35 minutes  Auburn Bilberry M.D on 07/18/2015 at 12:24 PM  [facility]   Office  [phone]

## 2015-07-19 DIAGNOSIS — E1142 Type 2 diabetes mellitus with diabetic polyneuropathy: Secondary | ICD-10-CM | POA: Diagnosis not present

## 2015-07-19 DIAGNOSIS — J181 Lobar pneumonia, unspecified organism: Secondary | ICD-10-CM | POA: Diagnosis not present

## 2015-07-19 DIAGNOSIS — F0280 Dementia in other diseases classified elsewhere without behavioral disturbance: Secondary | ICD-10-CM

## 2015-07-19 DIAGNOSIS — I5022 Chronic systolic (congestive) heart failure: Secondary | ICD-10-CM | POA: Diagnosis not present

## 2015-07-19 DIAGNOSIS — N390 Urinary tract infection, site not specified: Secondary | ICD-10-CM

## 2015-07-19 DIAGNOSIS — I82409 Acute embolism and thrombosis of unspecified deep veins of unspecified lower extremity: Secondary | ICD-10-CM

## 2015-07-19 DIAGNOSIS — G308 Other Alzheimer's disease: Secondary | ICD-10-CM

## 2015-07-20 LAB — CULTURE, BLOOD (ROUTINE X 2)
CULTURE: NO GROWTH
CULTURE: NO GROWTH

## 2015-07-23 ENCOUNTER — Telehealth: Payer: Self-pay

## 2015-07-23 ENCOUNTER — Telehealth: Payer: Self-pay | Admitting: *Deleted

## 2015-07-23 NOTE — Telephone Encounter (Signed)
Mrs William Byrd left v/m requesting cb; while pt was in hospital insulin was stopped. [facility] and [facility] is very sleepy. Mrs William Byrd is concerned that pt needs to be on insulin.

## 2015-07-23 NOTE — Telephone Encounter (Signed)
This has been addressed today at my visit

## 2015-07-23 NOTE — Telephone Encounter (Signed)
Please let her know that he has been on the lantus the whole time. Sliding scale insulin has actually been shown to be dangerous in the rehab setting. Let her know that I increased his lantus today to get his sugars better controlled

## 2015-07-23 NOTE — Telephone Encounter (Signed)
Spoke to the patient's wife. Advised her the Lantus is being increased

## 2015-07-23 NOTE — Telephone Encounter (Signed)
[facility] - Client Nonclinical Telephone Record [facility] - [facility] [place] [facility] Physician Tillman AbideLetvak, Richard - MD Contact Type Call Who Is Calling Physician / Provider / Hospital Call Type [facility] Reason for Call Request to speak to Physician Initial Comment [place] [facility], the patients blood sugar is 250 Additional Comment Patient Name William DienesJames Byrd Patient DOB [birth year][address] [facility] Number [phone] [facility] Paging DoctorName Phone DateTime Result/Outcome Message Type Notes Kerby NoraBedsole, Amy - MD [phone] 07/22/2015 12:35:54 PM Paged On Call to the Patient Doctor Paged Caller [place] [facility], the patients blood sugar is 250 [phone] Kerby NoraBedsole, Amy - MD 07/22/2015 12:35:59 PM Paged On Call to the Patient Message Result Call Closed By: Rogue BussingJasmine Thibou Transaction Date/Time: 07/22/2015 12:24:45 PM (ET)

## 2015-07-23 NOTE — Telephone Encounter (Signed)
Left message to call office

## 2015-10-17 ENCOUNTER — Emergency Department: Payer: Medicare Other

## 2015-10-17 ENCOUNTER — Inpatient Hospital Stay
Admission: EM | Admit: 2015-10-17 | Discharge: 2015-10-19 | DRG: 193 | Disposition: A | Discharge status: Home / Self Care | Payer: Medicare Other | Attending: Internal Medicine | Admitting: Internal Medicine

## 2015-10-17 DIAGNOSIS — Z8261 Family history of arthritis: Secondary | ICD-10-CM

## 2015-10-17 DIAGNOSIS — F0390 Unspecified dementia without behavioral disturbance: Secondary | ICD-10-CM | POA: Diagnosis present

## 2015-10-17 DIAGNOSIS — H409 Unspecified glaucoma: Secondary | ICD-10-CM | POA: Diagnosis present

## 2015-10-17 DIAGNOSIS — Z888 Allergy status to other drugs, medicaments and biological substances status: Secondary | ICD-10-CM

## 2015-10-17 DIAGNOSIS — E785 Hyperlipidemia, unspecified: Secondary | ICD-10-CM | POA: Diagnosis present

## 2015-10-17 DIAGNOSIS — Z833 Family history of diabetes mellitus: Secondary | ICD-10-CM | POA: Diagnosis not present

## 2015-10-17 DIAGNOSIS — N183 Chronic kidney disease, stage 3 (moderate): Secondary | ICD-10-CM | POA: Diagnosis present

## 2015-10-17 DIAGNOSIS — Z87891 Personal history of nicotine dependence: Secondary | ICD-10-CM

## 2015-10-17 DIAGNOSIS — Z794 Long term (current) use of insulin: Secondary | ICD-10-CM

## 2015-10-17 DIAGNOSIS — Z86718 Personal history of other venous thrombosis and embolism: Secondary | ICD-10-CM | POA: Diagnosis not present

## 2015-10-17 DIAGNOSIS — Z7401 Bed confinement status: Secondary | ICD-10-CM | POA: Diagnosis not present

## 2015-10-17 DIAGNOSIS — I5022 Chronic systolic (congestive) heart failure: Secondary | ICD-10-CM | POA: Diagnosis present

## 2015-10-17 DIAGNOSIS — Z800 Family history of malignant neoplasm of digestive organs: Secondary | ICD-10-CM | POA: Diagnosis not present

## 2015-10-17 DIAGNOSIS — I130 Hypertensive heart and chronic kidney disease with heart failure and stage 1 through stage 4 chronic kidney disease, or unspecified chronic kidney disease: Secondary | ICD-10-CM | POA: Diagnosis present

## 2015-10-17 DIAGNOSIS — Z79899 Other long term (current) drug therapy: Secondary | ICD-10-CM

## 2015-10-17 DIAGNOSIS — N179 Acute kidney failure, unspecified: Secondary | ICD-10-CM

## 2015-10-17 DIAGNOSIS — Z981 Arthrodesis status: Secondary | ICD-10-CM | POA: Diagnosis not present

## 2015-10-17 DIAGNOSIS — J189 Pneumonia, unspecified organism: Principal | ICD-10-CM | POA: Diagnosis present

## 2015-10-17 DIAGNOSIS — E1151 Type 2 diabetes mellitus with diabetic peripheral angiopathy without gangrene: Secondary | ICD-10-CM | POA: Diagnosis present

## 2015-10-17 DIAGNOSIS — E11319 Type 2 diabetes mellitus with unspecified diabetic retinopathy without macular edema: Secondary | ICD-10-CM | POA: Diagnosis present

## 2015-10-17 DIAGNOSIS — M5136 Other intervertebral disc degeneration, lumbar region: Secondary | ICD-10-CM | POA: Diagnosis present

## 2015-10-17 DIAGNOSIS — J9601 Acute respiratory failure with hypoxia: Secondary | ICD-10-CM | POA: Diagnosis present

## 2015-10-17 DIAGNOSIS — E1140 Type 2 diabetes mellitus with diabetic neuropathy, unspecified: Secondary | ICD-10-CM | POA: Diagnosis present

## 2015-10-17 DIAGNOSIS — Z7982 Long term (current) use of aspirin: Secondary | ICD-10-CM | POA: Diagnosis not present

## 2015-10-17 DIAGNOSIS — A419 Sepsis, unspecified organism: Secondary | ICD-10-CM

## 2015-10-17 DIAGNOSIS — Z7901 Long term (current) use of anticoagulants: Secondary | ICD-10-CM

## 2015-10-17 DIAGNOSIS — I472 Ventricular tachycardia: Secondary | ICD-10-CM | POA: Diagnosis present

## 2015-10-17 DIAGNOSIS — E1122 Type 2 diabetes mellitus with diabetic chronic kidney disease: Secondary | ICD-10-CM | POA: Diagnosis present

## 2015-10-17 LAB — URINALYSIS COMPLETE WITH MICROSCOPIC (ARMC ONLY)
BACTERIA UA: NONE SEEN
BILIRUBIN URINE: NEGATIVE
GLUCOSE, UA: NEGATIVE mg/dL
KETONES UR: NEGATIVE mg/dL
LEUKOCYTES UA: NEGATIVE
NITRITE: NEGATIVE
PROTEIN: 100 mg/dL — AB
SPECIFIC GRAVITY, URINE: 1.009 (ref 1.005–1.030)
Squamous Epithelial / LPF: NONE SEEN
pH: 6 (ref 5.0–8.0)

## 2015-10-17 LAB — CBC WITH DIFFERENTIAL/PLATELET
BASOS ABS: 0.1 10*3/uL (ref 0–0.1)
Basophils Relative: 1 %
EOS PCT: 0 %
Eosinophils Absolute: 0 10*3/uL (ref 0–0.7)
HEMATOCRIT: 33.5 % — AB (ref 40.0–52.0)
Hemoglobin: 11.5 g/dL — ABNORMAL LOW (ref 13.0–18.0)
LYMPHS PCT: 5 %
Lymphs Abs: 0.6 10*3/uL — ABNORMAL LOW (ref 1.0–3.6)
MCH: 31.5 pg (ref 26.0–34.0)
MCHC: 34.2 g/dL (ref 32.0–36.0)
MCV: 92 fL (ref 80.0–100.0)
MONO ABS: 0.8 10*3/uL (ref 0.2–1.0)
MONOS PCT: 8 %
Neutro Abs: 9.4 10*3/uL — ABNORMAL HIGH (ref 1.4–6.5)
Neutrophils Relative %: 86 %
PLATELETS: 276 10*3/uL (ref 150–440)
RBC: 3.64 MIL/uL — ABNORMAL LOW (ref 4.40–5.90)
RDW: 16 % — AB (ref 11.5–14.5)
WBC: 10.9 10*3/uL — ABNORMAL HIGH (ref 3.8–10.6)

## 2015-10-17 LAB — COMPREHENSIVE METABOLIC PANEL
ALBUMIN: 3.2 g/dL — AB (ref 3.5–5.0)
ALT: 15 U/L — AB (ref 17–63)
AST: 40 U/L (ref 15–41)
Alkaline Phosphatase: 74 U/L (ref 38–126)
Anion gap: 10 (ref 5–15)
BILIRUBIN TOTAL: 0.5 mg/dL (ref 0.3–1.2)
BUN: 30 mg/dL — AB (ref 6–20)
CHLORIDE: 99 mmol/L — AB (ref 101–111)
CO2: 26 mmol/L (ref 22–32)
CREATININE: 2.17 mg/dL — AB (ref 0.61–1.24)
Calcium: 8.9 mg/dL (ref 8.9–10.3)
GFR calc Af Amer: 31 mL/min — ABNORMAL LOW (ref >60)
GFR, EST NON AFRICAN AMERICAN: 27 mL/min — AB (ref >60)
GLUCOSE: 124 mg/dL — AB (ref 65–99)
Potassium: 4.4 mmol/L (ref 3.5–5.1)
Sodium: 135 mmol/L (ref 135–145)
TOTAL PROTEIN: 8.1 g/dL (ref 6.5–8.1)

## 2015-10-17 LAB — MAGNESIUM: MAGNESIUM: 2 mg/dL (ref 1.7–2.4)

## 2015-10-17 LAB — TROPONIN I

## 2015-10-17 LAB — BLOOD GAS, VENOUS
Acid-Base Excess: 2.2 mmol/L — ABNORMAL HIGH (ref 0.0–2.0)
BICARBONATE: 27.3 mmol/L (ref 20.0–28.0)
O2 SAT: 43.1 %
PATIENT TEMPERATURE: 37
pCO2, Ven: 44 mmHg (ref 44.0–60.0)
pH, Ven: 7.4 (ref 7.250–7.430)

## 2015-10-17 LAB — LACTIC ACID, PLASMA
Lactic Acid, Venous: 2.9 mmol/L (ref 0.5–1.9)
Lactic Acid, Venous: 2.3 mmol/L (ref 0.5–1.9)

## 2015-10-17 MED ORDER — LEVOFLOXACIN IN D5W 250 MG/50ML IV SOLN
250.0000 mg | INTRAVENOUS | Status: DC
  Filled 2015-10-17: qty 50

## 2015-10-17 MED ORDER — VANCOMYCIN HCL IN DEXTROSE 1-5 GM/200ML-% IV SOLN
1000.0000 mg | Freq: Once | INTRAVENOUS | Status: AC
  Administered 2015-10-17: 1000 mg via INTRAVENOUS
  Filled 2015-10-17 (×2): qty 200

## 2015-10-17 MED ORDER — MEMANTINE HCL ER 7 MG PO CP24
28.0000 mg | ORAL_CAPSULE | Freq: Every day | ORAL | Status: DC
  Administered 2015-10-18 – 2015-10-19 (×2): 28 mg via ORAL
  Filled 2015-10-17 (×2): qty 4

## 2015-10-17 MED ORDER — VITAMIN D 1000 UNITS PO TABS
1000.0000 [IU] | ORAL_TABLET | Freq: Every day | ORAL | Status: DC
  Administered 2015-10-18 – 2015-10-19 (×2): 1000 [IU] via ORAL
  Filled 2015-10-17 (×2): qty 1

## 2015-10-17 MED ORDER — FUROSEMIDE 40 MG PO TABS
40.0000 mg | ORAL_TABLET | Freq: Every day | ORAL | Status: DC
  Administered 2015-10-18 – 2015-10-19 (×2): 40 mg via ORAL
  Filled 2015-10-17 (×2): qty 1

## 2015-10-17 MED ORDER — SODIUM CHLORIDE 0.9 % IV SOLN
INTRAVENOUS | Status: DC
  Administered 2015-10-17: 22:00:00 via INTRAVENOUS

## 2015-10-17 MED ORDER — METOPROLOL TARTRATE 25 MG PO TABS
25.0000 mg | ORAL_TABLET | Freq: Two times a day (BID) | ORAL | Status: DC
  Administered 2015-10-17 – 2015-10-19 (×4): 25 mg via ORAL
  Filled 2015-10-17 (×4): qty 1

## 2015-10-17 MED ORDER — DONEPEZIL HCL 5 MG PO TABS
10.0000 mg | ORAL_TABLET | Freq: Every day | ORAL | Status: DC
  Administered 2015-10-17 – 2015-10-18 (×2): 10 mg via ORAL
  Filled 2015-10-17 (×2): qty 2

## 2015-10-17 MED ORDER — AMLODIPINE BESYLATE 5 MG PO TABS
5.0000 mg | ORAL_TABLET | Freq: Every day | ORAL | Status: DC
  Administered 2015-10-18 – 2015-10-19 (×2): 5 mg via ORAL
  Filled 2015-10-17 (×2): qty 1

## 2015-10-17 MED ORDER — ONDANSETRON HCL 4 MG PO TABS: 4.0000 mg | ORAL_TABLET | Freq: Four times a day (QID) | ORAL | Status: DC | PRN

## 2015-10-17 MED ORDER — ATORVASTATIN CALCIUM 20 MG PO TABS
20.0000 mg | ORAL_TABLET | Freq: Every day | ORAL | Status: DC
  Administered 2015-10-18 – 2015-10-19 (×2): 20 mg via ORAL
  Filled 2015-10-17 (×2): qty 1

## 2015-10-17 MED ORDER — SODIUM CHLORIDE 0.9 % IV BOLUS (SEPSIS)
500.0000 mL | Freq: Once | INTRAVENOUS | Status: AC
  Administered 2015-10-17: 500 mL via INTRAVENOUS

## 2015-10-17 MED ORDER — TIMOLOL MALEATE 0.5 % OP SOLN
1.0000 [drp] | Freq: Two times a day (BID) | OPHTHALMIC | Status: DC
  Administered 2015-10-17 – 2015-10-19 (×4): 1 [drp] via OPHTHALMIC
  Filled 2015-10-17: qty 5

## 2015-10-17 MED ORDER — PIPERACILLIN-TAZOBACTAM 3.375 G IVPB 30 MIN
3.3750 g | Freq: Once | INTRAVENOUS | Status: AC
  Administered 2015-10-17: 3.375 g via INTRAVENOUS
  Filled 2015-10-17: qty 50

## 2015-10-17 MED ORDER — PREGABALIN 75 MG PO CAPS
75.0000 mg | ORAL_CAPSULE | Freq: Two times a day (BID) | ORAL | Status: DC
  Administered 2015-10-17 – 2015-10-19 (×4): 75 mg via ORAL
  Filled 2015-10-17 (×4): qty 1

## 2015-10-17 MED ORDER — LEVOFLOXACIN IN D5W 500 MG/100ML IV SOLN
500.0000 mg | Freq: Once | INTRAVENOUS | Status: AC
  Administered 2015-10-17: 500 mg via INTRAVENOUS
  Filled 2015-10-17: qty 100

## 2015-10-17 MED ORDER — PROSTATE SR 160-250 MG PO CAPS: 1.0000 | ORAL_CAPSULE | Freq: Every day | ORAL | Status: DC

## 2015-10-17 MED ORDER — INSULIN DETEMIR 100 UNIT/ML ~~LOC~~ SOLN
25.0000 [IU] | Freq: Two times a day (BID) | SUBCUTANEOUS | Status: DC
  Administered 2015-10-18 – 2015-10-19 (×3): 25 [IU] via SUBCUTANEOUS
  Filled 2015-10-17 (×4): qty 0.25

## 2015-10-17 MED ORDER — FINASTERIDE 5 MG PO TABS
5.0000 mg | ORAL_TABLET | Freq: Every day | ORAL | Status: DC
  Administered 2015-10-18 – 2015-10-19 (×2): 5 mg via ORAL
  Filled 2015-10-17 (×2): qty 1

## 2015-10-17 MED ORDER — SODIUM CHLORIDE 0.9 % IV BOLUS (SEPSIS)
1000.0000 mL | Freq: Once | INTRAVENOUS | Status: AC
  Administered 2015-10-17: 1000 mL via INTRAVENOUS

## 2015-10-17 MED ORDER — SODIUM CHLORIDE 0.9 % IV BOLUS (SEPSIS)
1000.0000 mL | Freq: Once | INTRAVENOUS | Status: AC
Start: 2015-10-17 — End: 2015-10-17
  Administered 2015-10-17: 1000 mL via INTRAVENOUS

## 2015-10-17 MED ORDER — LINAGLIPTIN 5 MG PO TABS
5.0000 mg | ORAL_TABLET | Freq: Every day | ORAL | Status: DC
  Administered 2015-10-18 – 2015-10-19 (×2): 5 mg via ORAL
  Filled 2015-10-17 (×4): qty 1

## 2015-10-17 MED ORDER — ACETAMINOPHEN 650 MG RE SUPP: 650.0000 mg | Freq: Four times a day (QID) | RECTAL | Status: DC | PRN

## 2015-10-17 MED ORDER — BENZONATATE 100 MG PO CAPS: 200.0000 mg | ORAL_CAPSULE | Freq: Three times a day (TID) | ORAL | Status: DC | PRN

## 2015-10-17 MED ORDER — ACETAMINOPHEN 325 MG PO TABS: 650.0000 mg | ORAL_TABLET | Freq: Four times a day (QID) | ORAL | Status: DC | PRN

## 2015-10-17 MED ORDER — ENSURE ENLIVE PO LIQD
237.0000 mL | Freq: Two times a day (BID) | ORAL | Status: DC
  Administered 2015-10-18 – 2015-10-19 (×3): 237 mL via ORAL

## 2015-10-17 MED ORDER — ONDANSETRON HCL 4 MG/2ML IJ SOLN: 4.0000 mg | Freq: Four times a day (QID) | INTRAMUSCULAR | Status: DC | PRN

## 2015-10-17 MED ORDER — DOCUSATE SODIUM 100 MG PO CAPS: 100.0000 mg | ORAL_CAPSULE | Freq: Every day | ORAL | Status: DC | PRN

## 2015-10-17 MED ORDER — LEVOFLOXACIN IN D5W 250 MG/50ML IV SOLN
INTRAVENOUS | Status: AC
  Filled 2015-10-17: qty 50

## 2015-10-17 MED ORDER — DULOXETINE HCL 60 MG PO CPEP
60.0000 mg | ORAL_CAPSULE | Freq: Every day | ORAL | Status: DC
  Administered 2015-10-18 – 2015-10-19 (×2): 60 mg via ORAL
  Filled 2015-10-17 (×2): qty 1

## 2015-10-17 NOTE — ED Notes (Signed)
Pt in and out cath'd - tolerated procedure well

## 2015-10-17 NOTE — ED Provider Notes (Signed)
[facility] Emergency Department Provider Note  ____________________________________________   First MD Initiated Contact with Patient 10/17/15 1422     (approximate)  I have reviewed the triage vital signs and the nursing notes.   HISTORY  Chief Complaint Tremors  History limited by chronic dementia  HPI William Byrd is a 82 y.o. male with an extensive past medical history that includes admission to the hospital several months ago for pneumonia and chronic dementia who presents by EMS for evaluation of tremors that started several hours ago.  Additionally the patient is seeming "out of it" according to his wife seems less able to talk then usual.  She said that this is been gradual in onset over the last several days where he has been increasingly fatigued and does not seem to be his usual self.  When EMS came out to pick up the patient and they noted decreased breath sounds on the left side and he seemed to improve after an albuterol nebulizer treatment.  His wife does endorse a recent nonproductive cough that has been going on for a while and seems to be getting worse.Overall She describes his presentation right now as severe and nothing seems to be making it better or worse.   Past Medical History:  Diagnosis Date  . Arthritis   . Background retinopathy   . Cervicalgia   . Chronic kidney disease   . Chronic systolic CHF (congestive heart failure) (HCC)   . Degeneration of lumbar or lumbosacral intervertebral disc   . Dementia   . Diabetes mellitus without complication (HCC)   . DVT (deep venous thrombosis) (HCC)   . Generalized weakness   . Glaucoma   . Hypercholesteremia   . Hypertension   . Long-term insulin use (HCC)   . Peripheral polyneuropathy (HCC)   . Peripheral vascular disease (HCC)   . Spinal stenosis     Patient Active Problem List   Diagnosis Date Noted  . Pneumonia 10/17/2015  . CVA (cerebral infarction) 07/15/2015  .  Dehydration 12/28/2014  . Acute on chronic renal failure (HCC) 12/28/2014  . Generalized weakness 12/28/2014  . Acute renal failure (ARF) (HCC) 12/28/2014  . Lower extremity weakness 09/05/2014  . DVT of lower extremity, bilateral (HCC) 09/05/2014  . DM2 (diabetes mellitus, type 2) (HCC) 09/05/2014  . HTN (hypertension) 09/05/2014  . Dementia 09/05/2014  . CKD (chronic kidney disease) stage 3, GFR 30-59 ml/min 09/05/2014  . HLD (hyperlipidemia) 09/05/2014  . Spinal stenosis 09/05/2014    Past Surgical History:  Procedure Laterality Date  . c-spine fusion N/A     Prior to Admission medications   Medication Sig Start Date End Date Taking? Authorizing Provider  acetaminophen (TYLENOL) 325 MG tablet Take 650 mg by mouth every 6 (six) hours as needed for mild pain, moderate pain, fever or headache.   Yes Historical Provider, MD  amLODipine (NORVASC) 5 MG tablet Take 5 mg by mouth daily.    Yes Historical Provider, MD  atorvastatin (LIPITOR) 20 MG tablet Take 20 mg by mouth daily.   Yes Historical Provider, MD  benzonatate (TESSALON) 200 MG capsule Take 200 mg by mouth 3 (three) times daily as needed for cough.   Yes Historical Provider, MD  cholecalciferol (VITAMIN D) 1000 UNITS tablet Take 1,000 Units by mouth daily.   Yes Historical Provider, MD  docusate sodium (STOOL SOFTENER) 100 MG capsule Take 100 mg by mouth daily as needed for mild constipation or moderate constipation.    Yes  Historical Provider, MD  donepezil (ARICEPT) 10 MG tablet Take 10 mg by mouth at bedtime. 07/05/14  Yes Historical Provider, MD  DULoxetine (CYMBALTA) 60 MG capsule Take 60 mg by mouth daily. 06/01/14  Yes Historical Provider, MD  finasteride (PROSCAR) 5 MG tablet Take 5 mg by mouth daily. 07/19/14  Yes Historical Provider, MD  furosemide (LASIX) 20 MG tablet Take 40 mg by mouth daily. Take 2 tablets in the morning. Take an additional tablet 6 hours later.   Yes Historical Provider, MD  insulin NPH Human (HUMULIN  N,NOVOLIN N) 100 UNIT/ML injection Inject 25 Units into the skin 2 (two) times daily before a meal.   Yes Historical Provider, MD  linagliptin (TRADJENTA) 5 MG TABS tablet Take 5 mg by mouth daily.   Yes Historical Provider, MD  memantine (NAMENDA XR) 28 MG CP24 24 hr capsule Take 28 mg by mouth daily.   Yes Historical Provider, MD  potassium chloride SA (K-DUR,KLOR-CON) 20 MEQ tablet Take 20 mEq by mouth daily.   Yes Historical Provider, MD  pregabalin (LYRICA) 75 MG capsule Take 1 capsule (75 mg total) by mouth 2 (two) times daily. Patient taking differently: Take 75 mg by mouth daily as needed.  12/31/14  Yes Houston Siren, MD  rivaroxaban (XARELTO) 20 MG TABS tablet Take 1 tablet by mouth daily. 07/18/14  Yes Historical Provider, MD  Saw Palmetto-Phytosterols (PROSTATE SR) 160-250 MG CAPS Take 1 capsule by mouth daily.   Yes Historical Provider, MD  timolol (TIMOPTIC) 0.5 % ophthalmic solution Place 1 drop into both eyes 2 (two) times daily.   Yes Historical Provider, MD  feeding supplement, ENSURE ENLIVE, (ENSURE ENLIVE) LIQD Take 237 mLs by mouth 2 (two) times daily between meals. Patient not taking: Reported on 10/17/2015 07/18/15   Auburn Bilberry, MD  metoprolol tartrate (LOPRESSOR) 25 MG tablet Take 1 tablet (25 mg total) by mouth 2 (two) times daily. Patient not taking: Reported on 10/17/2015 09/08/14   [name], MD    Allergies Fosinopril  Family History  Problem Relation Age of Onset  . Diabetes Mother   . Liver cancer Father   . Liver disease Father   . Arthritis Sister     Social History Social History  Substance Use Topics  . Smoking status: Former Smoker    Packs/day: 0.50    Years: 30.00  . Smokeless tobacco: Never Used  . Alcohol use No    Review of Systems Level V caveat -  Unable to obtain due to patient's acute illness and chronic dementia. ____________________________________________   PHYSICAL EXAM:  VITAL SIGNS: ED Triage Vitals  Enc Vitals Group       BP 10/17/15 1407 (!) 178/87     Pulse Rate 10/17/15 1407 (!) 125     Resp 10/17/15 1407 20     Temp 10/17/15 1407 100.3 F (37.9 C)     Temp Source 10/17/15 1407 Oral     SpO2 10/17/15 1407 95 %     Weight 10/17/15 1408 170 lb (77.1 kg)     Height 10/17/15 1408 5' 7.32" (1.71 m)     Head Circumference --      Peak Flow --      Pain Score --      Pain Loc --      Pain Edu? --      Excl. in GC? --     Constitutional: Somnolent, becomes alert to voice and physical stimulation, but will not respond in full sentences.  Ill-appearing. Eyes: Conjunctivae are normal. PERRL. EOMI. Head: Atraumatic. Nose: No congestion/rhinnorhea. Mouth/Throat: Mucous membranes are moist.  Oropharynx non-erythematous. Neck: No stridor.  No meningeal signs.   Cardiovascular: Tachycardia, regular rhythm. Good peripheral circulation. Grossly normal heart sounds. Respiratory: Normal respiratory effort.  No retractions. Lungs CTAB. Diminished breath sounds on right lower field Gastrointestinal: Soft and nontender. No distention.  Musculoskeletal: No lower extremity tenderness nor edema. No gross deformities of extremities. Neurologic:  Normal speech and language. No gross focal neurologic deficits are appreciated but patient is tremulous. Skin:  Skin is warm (feels febrile), dry and intact. No rash noted.   ____________________________________________   LABS (all labs ordered are listed, but only abnormal results are displayed)  Labs Reviewed  LACTIC ACID, PLASMA - Abnormal; Notable for the following:       Result Value   Lactic Acid, Venous 2.9 (*)    All other components within normal limits  LACTIC ACID, PLASMA - Abnormal; Notable for the following:    Lactic Acid, Venous 2.3 (*)    All other components within normal limits  COMPREHENSIVE METABOLIC PANEL - Abnormal; Notable for the following:    Chloride 99 (*)    Glucose, Bld 124 (*)    BUN 30 (*)    Creatinine, Ser 2.17 (*)    Albumin 3.2  (*)    ALT 15 (*)    GFR calc non Af Amer 27 (*)    GFR calc Af Amer 31 (*)    All other components within normal limits  CBC WITH DIFFERENTIAL/PLATELET - Abnormal; Notable for the following:    WBC 10.9 (*)    RBC 3.64 (*)    Hemoglobin 11.5 (*)    HCT 33.5 (*)    RDW 16.0 (*)    Neutro Abs 9.4 (*)    Lymphs Abs 0.6 (*)    All other components within normal limits  URINALYSIS COMPLETEWITH MICROSCOPIC ([facility] ONLY) - Abnormal; Notable for the following:    Color, Urine YELLOW (*)    APPearance CLEAR (*)    Hgb urine dipstick 1+ (*)    Protein, ur 100 (*)    All other components within normal limits  BLOOD GAS, VENOUS - Abnormal; Notable for the following:    pO2, Ven <31.0 (*)    Acid-Base Excess 2.2 (*)    All other components within normal limits  CULTURE, BLOOD (ROUTINE X 2)  CULTURE, BLOOD (ROUTINE X 2)  URINE CULTURE  TROPONIN I  MAGNESIUM  BASIC METABOLIC PANEL  CBC  LACTIC ACID, PLASMA   ____________________________________________  EKG  ED ECG REPORT I, Verlyn Dannenberg, the attending physician, personally viewed and interpreted this ECG.  Date: 10/17/2015 EKG Time: 14:18 Rate: 120 Rhythm: Sinus tachycardia QRS Axis: normal Intervals: normal ST/T Wave abnormalities: normal Conduction Disturbances: none Narrative Interpretation: unremarkable  ____________________________________________  RADIOLOGY   Dg Chest 2 View  Result Date: 10/17/2015 CLINICAL DATA:  Pt arrived via ems for c/o tremors that started around lunch - no tremors noted at this time - pt from home - recent dx of pneumonia - ems reported decreased breath sounds in left upper lobe - non productive cough . EXAM: CHEST  2 VIEW COMPARISON:  07/15/2015 FINDINGS: Normal cardiac silhouette. There is a bulge along the RIGHT mediastinum at the level of the pulmonary artery. Cannot exclude adenopathy or mass. Chronic bronchitic markings. Multiple granulomata within the spleen. Mild RIGHT basilar opacity.  IMPRESSION: 1. Bulge along the RIGHT mediastinum border. Recommend CT with  contrast for further evaluation. 2. RIGHT basilar opacity representing atelectasis or infiltrate. Electronically Signed   By: Genevive Bi M.D.   On: 10/17/2015 15:14   Ct Head Wo Contrast  Result Date: 10/17/2015 CLINICAL DATA:  Altered mental status. Difficulty speaking. New onset tremors. EXAM: CT HEAD WITHOUT CONTRAST TECHNIQUE: Contiguous axial images were obtained from the base of the skull through the vertex without intravenous contrast. COMPARISON:  MRI brain 07/15/2015 FINDINGS: Brain: Moderate generalized atrophy and diffuse white matter disease is again seen bilaterally. There are remote lacunar infarcts within the basal ganglia and mid pons. These are stable. Remote lacunar infarcts are also present within the right cerebellum. The basal ganglia are otherwise stable. The insular ribbon is intact. No acute or focal cortical infarct is present. No acute infarct, hemorrhage, or mass lesion is present. Vascular: Atherosclerotic calcifications are present within the cavernous internal carotid arteries and at the dural margin of the right vertebral artery. There is no focal hyperdense vessel. Skull: The calvarium is intact. No focal lytic or blastic lesions are present. Sinuses/Orbits: The paranasal sinuses and mastoid air cells are clear. Other: No significant extracranial soft tissue lesions are present. IMPRESSION: Negative CT of the head. Electronically Signed   By: Marin Roberts M.D.   On: 10/17/2015 15:13    ____________________________________________   PROCEDURES  Procedure(s) performed:   .Critical Care Performed by: Loleta Rose Authorized by: Loleta Rose   Critical care provider statement:    Critical care time (minutes):  45   Critical care time was exclusive of:  Separately billable procedures and treating other patients   Critical care was necessary to treat or prevent imminent or  life-threatening deterioration of the following conditions:  Sepsis   Critical care was time spent personally by me on the following activities:  Development of treatment plan with patient or surrogate, discussions with consultants, evaluation of patient's response to treatment, examination of patient, obtaining history from patient or surrogate, ordering and performing treatments and interventions, ordering and review of laboratory studies, ordering and review of radiographic studies, pulse oximetry, re-evaluation of patient's condition and review of old charts      Critical Care performed: Yes, see critical care procedure note(s) ____________________________________________   INITIAL IMPRESSION / ASSESSMENT AND PLAN / ED COURSE  Pertinent labs & imaging results that were available during my care of the patient were reviewed by me and considered in my medical decision making (see chart for details).  Ill-appearing, sepsis versus CVA although I think sepsis is more likely based on the patient's low-grade fever and tachycardia.  Code sepsis protocol was initiated including empiric antibiotics and 30 mL/kg of IV fluids.  We will continue to evaluate closely.   Clinical Course  Value Comment By Time  DG Chest 2 View Probable pneumonia as well as an unspecified mass.  At this point the patient's creatinine does not support a CT chest with IV contrast.  I anticipate that this can be obtained as an inpatient after he is treated for his acute kidney injury and pneumonia/sepsis. Loleta Rose, MD 09/06 1524   Apparently the label on the I&O urine specimen was misprinted with only the patient's MRN.  I talked to the lab about without or not they could use it if we provide additional labels with additional identifying information.  They are checking with Dr. Oneita Kras.  My concern is that now the urine may be sterilized after Zosyn and Vancomycin, as well as the discomfort of undergoing another I&O  catheterization.  They will call us back.  Lactic acid is still pending.   Loleta Rose, MD 09/06 1645   Of note, the reason the patient has been here for 3 hours and we do not yet have a lactic acid is that the nursing team had a very difficult time obtaining blood on him and it required a phlebotomist from the lab to come up and finish the blood draws including a lactic acid. Loleta Rose, MD 09/06 1650  Lactic Acid, Venous: (!!) 2.9 Lactic is elevated at 2.9 consistent with our impression of sepsis likely due to the right lower lobe infiltrate.  Creatinine is slightly elevated from baseline as well.  He looks better than he did previously and although his initial presenting oxygen saturation was 87% on room air is now satting 97% on 2 L and he looks better with better color overall and he is talking now which she was not doing initially.  I discussed the case with Dr. Quentin Cornwall with the hospitalist service who will admit. Loleta Rose, MD 09/06 1721   ED Sepsis - Repeat Assessment   Performed at:    10/17/2015 @ 16:50  Last Vitals:    Blood pressure (!) 146/76, pulse (!) 105, resp. rate 23, SpO2 97 % on 2L O2 [place].  Heart:      Clear, normal heart sounds, good peripheral perfusion  Lungs:     CTAB but diminished on RLL  Capillary Refill:   normal  Peripheral Pulse (include location): Right radial = normal   Skin (include color):   Improved, earlier was somewhat pale for ethnicity  ____________________________________________  FINAL CLINICAL IMPRESSION(S) / ED DIAGNOSES  Final diagnoses:  Community acquired pneumonia  Sepsis, due to unspecified organism (HCC)  Acute kidney injury (HCC)     MEDICATIONS GIVEN DURING THIS VISIT:  Medications  acetaminophen (TYLENOL) tablet 650 mg (not administered)    Or  acetaminophen (TYLENOL) suppository 650 mg (not administered)  ondansetron (ZOFRAN) tablet 4 mg (not administered)    Or  ondansetron (ZOFRAN) injection 4 mg (not administered)  0.9  %  sodium chloride infusion ( Intravenous New Bag/Given 10/17/15 2202)  Levofloxacin (LEVAQUIN) IVPB 250 mg (not administered)  insulin detemir (LEVEMIR) injection 25 Units (not administered)  atorvastatin (LIPITOR) tablet 20 mg (not administered)  feeding supplement (ENSURE ENLIVE) (ENSURE ENLIVE) liquid 237 mL (not administered)  benzonatate (TESSALON) capsule 200 mg (not administered)  cholecalciferol (VITAMIN D) tablet 1,000 Units (not administered)  linagliptin (TRADJENTA) tablet 5 mg (5 mg Oral Not Given 10/17/15 2054)  furosemide (LASIX) tablet 40 mg (not administered)  memantine (NAMENDA XR) 24 hr capsule 28 mg (not administered)  pregabalin (LYRICA) capsule 75 mg (75 mg Oral Given 10/17/15 2156)  amLODipine (NORVASC) tablet 5 mg (not administered)  metoprolol tartrate (LOPRESSOR) tablet 25 mg (25 mg Oral Given 10/17/15 2150)  timolol (TIMOPTIC) 0.5 % ophthalmic solution 1 drop (1 drop Both Eyes Given 10/17/15 2157)  docusate sodium (COLACE) capsule 100 mg (not administered)  donepezil (ARICEPT) tablet 10 mg (10 mg Oral Given 10/17/15 2150)  DULoxetine (CYMBALTA) DR capsule 60 mg (not administered)  finasteride (PROSCAR) tablet 5 mg (not administered)  sodium chloride 0.9 % bolus 1,000 mL (0 mLs Intravenous Stopped 10/17/15 1732)    And  sodium chloride 0.9 % bolus 1,000 mL (0 mLs Intravenous Stopped 10/17/15 1634)    And  sodium chloride 0.9 % bolus 500 mL (0 mLs Intravenous Stopped 10/17/15 1800)  piperacillin-tazobactam (ZOSYN) IVPB 3.375 g (0 g  Intravenous Stopped 10/17/15 1634)  vancomycin (VANCOCIN) IVPB 1000 mg/200 mL premix (0 mg Intravenous Stopped 10/17/15 1732)  levofloxacin (LEVAQUIN) IVPB 500 mg (500 mg Intravenous Given 10/17/15 2146)     NEW OUTPATIENT MEDICATIONS STARTED DURING THIS VISIT:  Current Discharge Medication List      Current Discharge Medication List      Current Discharge Medication List       Note:  This document was prepared using Dragon voice recognition  software and may include unintentional dictation errors.    Loleta Roseory Tristin Gladman, MD 10/17/15 2252

## 2015-10-17 NOTE — ED Notes (Signed)
After 2 attempts by Penni BombardKendall RN, 2 attempts by Amy RN, and 2 attempts by myself - lab was called to draw VBG, lactic acid, and blood cultures

## 2015-10-17 NOTE — ED Triage Notes (Signed)
Pt arrived via ems for c/o tremors that started around lunch - no tremors noted at this time - pt from home - recent dx of pneumonia - ems reported decreased breath sounds in left upper lobe - gave 5mg  albuterol neb and lung sounds cleared to all lobes - non productive cough reported by spouse- hx of DM, CHF, and dementia

## 2015-10-17 NOTE — ED Notes (Signed)
Lab called with issue with urine - lab on specimen only had one identifier - after talking with myself and Dr York CeriseForbach about the fact that atb's had already been given so a repeat urine was not possible, the lab called their director and now allowed to run urine - this nurse took new label with 2 identifiers to the lab and relabeled specimen - Dr York CeriseForbach filled out form taking responsibility for the urine sample

## 2015-10-17 NOTE — ED Notes (Signed)
Pt O2 Sat 87-88% on room air - placed on 2L O2 via n/c - MD aware

## 2015-10-17 NOTE — H&P (Signed)
[facility] - [place] at [facility]   PATIENT NAME: William Byrd    MR#:  [id number]  DATE OF BIRTH:  10-28-1931  DATE OF ADMISSION:  10/17/2015  PRIMARY CARE PHYSICIAN: Marisue Ivan, MD   REQUESTING/REFERRING PHYSICIAN: Dr. Loleta Rose  CHIEF COMPLAINT:   Chief Complaint  Patient presents with  . Tremors    HISTORY OF PRESENT ILLNESS:  William Byrd  is a 82 y.o. male with a known history of Dementia, previous history of DVT, chronic kidney disease stage III, chronic systolic CHF, osteoarthritis, hypertension, hyperlipidemia, glaucoma, peripheral neuropathy who presented to the hospital due to tremors/shakes and a mild cough. Patient has dementia and therefore most history obtained from the wife at bedside as per the wife patient was noted to have some tremors and shakes around lunchtime and became a little bit lethargic and had a cough shortly thereafter. He does have a previous history of pneumonia and therefore she brought him to the ER for further evaluation. Emergency room patient was noted to be hypoxic with O2 sats in room air in the mid 80s, his chest x-ray findings were suggestive of right lower lobe pneumonia. Hospitalist services were contacted further treatment and evaluation. Patient denies any chest pain, shortness of breath, nausea, vomiting, abdominal pain, fever, chills, sore recent sick contacts or any other associated symptoms presently.  PAST MEDICAL HISTORY:   Past Medical History:  Diagnosis Date  . Arthritis   . Background retinopathy   . Cervicalgia   . Chronic kidney disease   . Chronic systolic CHF (congestive heart failure) (HCC)   . Degeneration of lumbar or lumbosacral intervertebral disc   . Dementia   . Diabetes mellitus without complication (HCC)   . DVT (deep venous thrombosis) (HCC)   . Generalized weakness   . Glaucoma   . Hypercholesteremia   . Hypertension   . Long-term insulin use (HCC)   . Peripheral  polyneuropathy (HCC)   . Peripheral vascular disease (HCC)   . Spinal stenosis     PAST SURGICAL HISTORY:   Past Surgical History:  Procedure Laterality Date  . c-spine fusion N/A     SOCIAL HISTORY:   Social History  Substance Use Topics  . Smoking status: Former Smoker    Packs/day: 0.50    Years: 30.00  . Smokeless tobacco: Never Used  . Alcohol use No    FAMILY HISTORY:   Family History  Problem Relation Age of Onset  . Diabetes Mother   . Liver cancer Father   . Liver disease Father   . Arthritis Sister     DRUG ALLERGIES:   Allergies  Allergen Reactions  . Fosinopril Other (See Comments)    Reaction: Hyperkalemia     REVIEW OF SYSTEMS:   Review of Systems  Constitutional: Negative for fever and weight loss.  HENT: Negative for congestion, nosebleeds and tinnitus.   Eyes: Negative for blurred vision, double vision and redness.  Respiratory: Positive for cough. Negative for hemoptysis and shortness of breath.   Cardiovascular: Negative for chest pain, orthopnea, leg swelling and PND.  Gastrointestinal: Negative for abdominal pain, diarrhea, melena, nausea and vomiting.  Genitourinary: Negative for dysuria, hematuria and urgency.  Musculoskeletal: Negative for falls and joint pain.  Neurological: Positive for tremors and weakness. Negative for dizziness, tingling, sensory change, focal weakness, seizures and headaches.  Endo/Heme/Allergies: Negative for polydipsia. Does not bruise/bleed easily.  Psychiatric/Behavioral: Negative for depression and memory loss. The patient is not nervous/anxious.  MEDICATIONS AT HOME:   Prior to Admission medications   Medication Sig Start Date End Date Taking? Authorizing Provider  acetaminophen (TYLENOL) 325 MG tablet Take 650 mg by mouth every 6 (six) hours as needed for mild pain, moderate pain, fever or headache.   Yes Historical Provider, MD  amLODipine (NORVASC) 5 MG tablet Take 5 mg by mouth daily.    Yes  Historical Provider, MD  atorvastatin (LIPITOR) 20 MG tablet Take 20 mg by mouth daily.   Yes Historical Provider, MD  benzonatate (TESSALON) 200 MG capsule Take 200 mg by mouth 3 (three) times daily as needed for cough.   Yes Historical Provider, MD  cholecalciferol (VITAMIN D) 1000 UNITS tablet Take 1,000 Units by mouth daily.   Yes Historical Provider, MD  docusate sodium (STOOL SOFTENER) 100 MG capsule Take 100 mg by mouth daily as needed for mild constipation or moderate constipation.    Yes Historical Provider, MD  donepezil (ARICEPT) 10 MG tablet Take 10 mg by mouth at bedtime. 07/05/14  Yes Historical Provider, MD  DULoxetine (CYMBALTA) 60 MG capsule Take 60 mg by mouth daily. 06/01/14  Yes Historical Provider, MD  finasteride (PROSCAR) 5 MG tablet Take 5 mg by mouth daily. 07/19/14  Yes Historical Provider, MD  furosemide (LASIX) 20 MG tablet Take 40 mg by mouth daily. Take 2 tablets in the morning. Take an additional tablet 6 hours later.   Yes Historical Provider, MD  insulin glargine (LANTUS) 100 UNIT/ML injection Inject 20 Units into the skin at bedtime.    Yes Historical Provider, MD  insulin lispro (HUMALOG) 100 UNIT/ML injection Inject 10-15 Units into the skin 3 (three) times daily before meals. 10 units before breakfast and lunch. 15 units before supper   Yes Historical Provider, MD  ketorolac (ACULAR) 0.4 % SOLN Place 1 drop into both eyes at bedtime.    Yes Historical Provider, MD  linagliptin (TRADJENTA) 5 MG TABS tablet Take 5 mg by mouth daily.   Yes Historical Provider, MD  memantine (NAMENDA XR) 28 MG CP24 24 hr capsule Take 28 mg by mouth daily.   Yes Historical Provider, MD  metoprolol tartrate (LOPRESSOR) 25 MG tablet Take 1 tablet (25 mg total) by mouth 2 (two) times daily. 09/08/14  Yes [name], MD  pregabalin (LYRICA) 75 MG capsule Take 1 capsule (75 mg total) by mouth 2 (two) times daily. 12/31/14  Yes Houston Siren, MD  rivaroxaban (XARELTO) 20 MG TABS tablet  Take 1 tablet by mouth daily. 07/18/14  Yes Historical Provider, MD  Saw Palmetto-Phytosterols (PROSTATE SR) 160-250 MG CAPS Take 1 capsule by mouth daily.   Yes Historical Provider, MD  timolol (TIMOPTIC) 0.5 % ophthalmic solution Place 1 drop into both eyes 2 (two) times daily.   Yes Historical Provider, MD  aspirin 81 MG chewable tablet Chew 1 tablet (81 mg total) by mouth daily. 07/18/15   Auburn Bilberry, MD  feeding supplement, ENSURE ENLIVE, (ENSURE ENLIVE) LIQD Take 237 mLs by mouth 2 (two) times daily between meals. 07/18/15   Auburn Bilberry, MD  hydrALAZINE (APRESOLINE) 25 MG tablet Take 1 tablet (25 mg total) by mouth every 8 (eight) hours. Patient not taking: Reported on 10/17/2015 07/18/15   Auburn Bilberry, MD      VITAL SIGNS:  Blood pressure 127/70, pulse (!) 105, temperature 100.3 F (37.9 C), temperature source Oral, resp. rate 16, height 5' 7.32" (1.71 m), weight 77.1 kg (170 lb), SpO2 92 %.  PHYSICAL EXAMINATION:  Physical Exam  GENERAL:  82 y.o.-year-old patient lying in the bed in no acute distress.  EYES: Pupils equal, round, reactive to light and accommodation. No scleral icterus. Extraocular muscles intact.  HEENT: Head atraumatic, normocephalic. Oropharynx and nasopharynx clear. No oropharyngeal erythema, moist oral mucosa  NECK:  Supple, no jugular venous distention. No thyroid enlargement, no tenderness.  LUNGS: Normal breath sounds bilaterally, no wheezing, crackles on right lung base, no rhonchi. No use of accessory muscles of respiration.  CARDIOVASCULAR: S1, S2 RRR. II/VI SEM at LSB, No rubs, gallops, clicks.  ABDOMEN: Soft, nontender, nondistended. Bowel sounds present. No organomegaly or mass.  EXTREMITIES: No pedal edema, cyanosis, or clubbing. + 2 pedal & radial pulses b/l.   NEUROLOGIC: Cranial nerves II through XII are intact. No focal Motor or sensory deficits appreciated b/l. Globally weak.  PSYCHIATRIC: The patient is alert and oriented x 2.  SKIN: No obvious  rash, lesion, or ulcer.   LABORATORY PANEL:   CBC  Recent Labs Lab 10/17/15 1434  WBC 10.9*  HGB 11.5*  HCT 33.5*  PLT 276   ------------------------------------------------------------------------------------------------------------------  Chemistries   Recent Labs Lab 10/17/15 1434  NA 135  K 4.4  CL 99*  CO2 26  GLUCOSE 124*  BUN 30*  CREATININE 2.17*  CALCIUM 8.9  MG 2.0  AST 40  ALT 15*  ALKPHOS 74  BILITOT 0.5   ------------------------------------------------------------------------------------------------------------------  Cardiac Enzymes  Recent Labs Lab 10/17/15 1434  TROPONINI <0.03   ------------------------------------------------------------------------------------------------------------------  RADIOLOGY:  Dg Chest 2 View  Result Date: 10/17/2015 CLINICAL DATA:  Pt arrived via ems for c/o tremors that started around lunch - no tremors noted at this time - pt from home - recent dx of pneumonia - ems reported decreased breath sounds in left upper lobe - non productive cough . EXAM: CHEST  2 VIEW COMPARISON:  07/15/2015 FINDINGS: Normal cardiac silhouette. There is a bulge along the RIGHT mediastinum at the level of the pulmonary artery. Cannot exclude adenopathy or mass. Chronic bronchitic markings. Multiple granulomata within the spleen. Mild RIGHT basilar opacity. IMPRESSION: 1. Bulge along the RIGHT mediastinum border. Recommend CT with contrast for further evaluation. 2. RIGHT basilar opacity representing atelectasis or infiltrate. Electronically Signed   By: Genevive Bi M.D.   On: 10/17/2015 15:14   Ct Head Wo Contrast  Result Date: 10/17/2015 CLINICAL DATA:  Altered mental status. Difficulty speaking. New onset tremors. EXAM: CT HEAD WITHOUT CONTRAST TECHNIQUE: Contiguous axial images were obtained from the base of the skull through the vertex without intravenous contrast. COMPARISON:  MRI brain 07/15/2015 FINDINGS: Brain: Moderate  generalized atrophy and diffuse white matter disease is again seen bilaterally. There are remote lacunar infarcts within the basal ganglia and mid pons. These are stable. Remote lacunar infarcts are also present within the right cerebellum. The basal ganglia are otherwise stable. The insular ribbon is intact. No acute or focal cortical infarct is present. No acute infarct, hemorrhage, or mass lesion is present. Vascular: Atherosclerotic calcifications are present within the cavernous internal carotid arteries and at the dural margin of the right vertebral artery. There is no focal hyperdense vessel. Skull: The calvarium is intact. No focal lytic or blastic lesions are present. Sinuses/Orbits: The paranasal sinuses and mastoid air cells are clear. Other: No significant extracranial soft tissue lesions are present. IMPRESSION: Negative CT of the head. Electronically Signed   By: Marin Roberts M.D.   On: 10/17/2015 15:13     IMPRESSION AND PLAN:   82 year old male with past medical history  of dementia, hypertension, hyperlipidemia, diabetes, glaucoma, peripheral vascular disease, history of previous DVT, chronic systolic CHF, chronic kidney disease stage III who presents to the hospital due to tremors shakes and a mild cough and noted to have a right lower lobe pneumonia.  1. Acute respiratory failure with hypoxia-secondary to pneumonia. -Patient's O2 sats were down to the mid 80s on room air admission. Patient was noted to have a right lower lobe pneumonia on chest x-ray. -Continue IV antibiotics for pneumonia, continue O2 supplementation and weakness tolerated.  2. Pneumonia-community acquired. Patient received IV vancomycin and Zosyn in the emergency room. -I will start patient on Levaquin. Follow blood, sputum cultures. Clinically patient is afebrile and hemodynamically stable.  3. Diabetes type 2 without complication-continue Lantus and lispro with meals. -Follow blood sugars.  4. History  of CHF-clinically patient is not in congestive heart failure. Continue Lasix, continue metoprolol.  5. Diabetic neuropathy-continue local.  6. History of previous DVT-continue Xarelto. Patient is DVT was over 3 years ago as per the wife. I suspect he can be taken off his Xarelto but I will let primary team address this prior to discharge.  7. Dementia-continue Aricept, Namenda.  8. Hyperlipidemia-continue atorvastatin.   All the records are reviewed and case discussed with ED provider. Management plans discussed with the patient, family and they are in agreement.  CODE STATUS: Full  TOTAL TIME TAKING CARE OF THIS PATIENT: 45 minutes.    Katoya Amato J M.D on 10/17/2015 at 5:42 PM  Between 7am to 6pHouston Sirenm - Pager - [phone]  After 6pm go to [email or web address] - password [email or web address] [facility]  [facility]  Office  [phone]  CC: Primary care physician; [name], [name], MD

## 2015-10-17 NOTE — Progress Notes (Signed)
Pharmacy Antibiotic Note  William Byrd is a 82 y.o. male admitted on 10/17/2015 with pneumonia.  Pharmacy has been consulted for levofloxacin dosing.  Plan: Levofloxacin 500 mg IV x 1 dose followed by levofloxacin 250 mg IV q24h beginning tomorrow  Height: 5' 7.32" (171 cm) Weight: 170 lb (77.1 kg) IBW/kg (Calculated) : 66.84  Temp (24hrs), Avg:100.3 F (37.9 C), Min:100.3 F (37.9 C), Max:100.3 F (37.9 C)   Recent Labs Lab 10/17/15 1434 10/17/15 1620  WBC 10.9*  --   CREATININE 2.17*  --   LATICACIDVEN  --  2.9*    Estimated Creatinine Clearance: 24.8 mL/min (by C-G formula based on SCr of 2.17 mg/dL).    Allergies  Allergen Reactions  . Fosinopril Other (See Comments)    Reaction: Hyperkalemia     Antimicrobials this admission: levofloxacin 9/6 >>  Vancomycin and piperacillin/tazobactam one time ED doses >> 9/6  Dose adjustments this admission:  Microbiology results: 9/6 BCx: Sent 9/6 UCx: Sent   Thank you for allowing pharmacy to be a part of this patient's care.  Cindi CarbonMary M Amaurie Schreckengost, PharmD, BCPS Clinical Pharmacist 10/17/2015 6:04 PM

## 2015-10-17 NOTE — ED Notes (Signed)
Pt arrived via ems for c/o tremors that started around lunch - no tremors noted at this time - pt from home - recent dx of pneumonia - ems reported decreased breath sounds in left upper lobe - gave 5mg albuterol neb and lung sounds cleared to all lobes - non productive cough reported by spouse- hx of DM, CHF, and dementia 

## 2015-10-17 NOTE — ED Notes (Signed)
First lactic just drawn and reported at 1715 - will need to stagger 2nd lactic acid for accuracy - MD aware

## 2015-10-17 NOTE — Progress Notes (Signed)
PHARMACIST - PHYSICIAN ORDER COMMUNICATION  CONCERNING: P&T Medication Policy on Herbal Medications  DESCRIPTION:  This patient's order for:  Prostate SR   has been noted.  This product(s) is classified as an "herbal" or natural product. Due to a lack of definitive safety studies or FDA approval, nonstandard manufacturing practices, plus the potential risk of unknown drug-drug interactions while on inpatient medications, the Pharmacy and Therapeutics Committee does not permit the use of "herbal" or natural products of this type within [facility].   ACTION TAKEN: The pharmacy department is unable to verify this order at this time and your patient has been informed of this safety policy. Please reevaluate patient's clinical condition at discharge and address if the herbal or natural product(s) should be resumed at that time.

## 2015-10-17 NOTE — ED Notes (Signed)
Dr York CeriseForbach notified of critical lactic acid

## 2015-10-17 NOTE — ED Notes (Signed)
Code  Sepsis  Called  To  [facility] 

## 2015-10-18 LAB — BASIC METABOLIC PANEL
ANION GAP: 6 (ref 5–15)
BUN: 27 mg/dL — ABNORMAL HIGH (ref 6–20)
CALCIUM: 8.3 mg/dL — AB (ref 8.9–10.3)
CO2: 27 mmol/L (ref 22–32)
Chloride: 104 mmol/L (ref 101–111)
Creatinine, Ser: 1.93 mg/dL — ABNORMAL HIGH (ref 0.61–1.24)
GFR calc Af Amer: 36 mL/min — ABNORMAL LOW (ref >60)
GFR, EST NON AFRICAN AMERICAN: 31 mL/min — AB (ref >60)
Glucose, Bld: 181 mg/dL — ABNORMAL HIGH (ref 65–99)
POTASSIUM: 3.9 mmol/L (ref 3.5–5.1)
SODIUM: 137 mmol/L (ref 135–145)

## 2015-10-18 LAB — LACTIC ACID, PLASMA: LACTIC ACID, VENOUS: 1.7 mmol/L (ref 0.5–1.9)

## 2015-10-18 LAB — URINE CULTURE: CULTURE: NO GROWTH

## 2015-10-18 LAB — CBC
HEMATOCRIT: 28.1 % — AB (ref 40.0–52.0)
HEMOGLOBIN: 9.8 g/dL — AB (ref 13.0–18.0)
MCH: 31.7 pg (ref 26.0–34.0)
MCHC: 34.7 g/dL (ref 32.0–36.0)
MCV: 91.4 fL (ref 80.0–100.0)
Platelets: 230 10*3/uL (ref 150–440)
RBC: 3.07 MIL/uL — ABNORMAL LOW (ref 4.40–5.90)
RDW: 15.5 % — ABNORMAL HIGH (ref 11.5–14.5)
WBC: 8.1 10*3/uL (ref 3.8–10.6)

## 2015-10-18 LAB — CREATININE, SERUM
CREATININE: 1.84 mg/dL — AB (ref 0.61–1.24)
GFR calc Af Amer: 38 mL/min — ABNORMAL LOW (ref >60)
GFR, EST NON AFRICAN AMERICAN: 32 mL/min — AB (ref >60)

## 2015-10-18 MED ORDER — LEVOFLOXACIN 500 MG PO TABS
250.0000 mg | ORAL_TABLET | Freq: Every day | ORAL | Status: DC
  Administered 2015-10-18: 17:00:00 250 mg via ORAL
  Filled 2015-10-18: qty 1

## 2015-10-18 MED ORDER — APIXABAN 5 MG PO TABS
5.0000 mg | ORAL_TABLET | Freq: Two times a day (BID) | ORAL | Status: DC
  Administered 2015-10-18 (×2): 5 mg via ORAL
  Filled 2015-10-18 (×2): qty 1

## 2015-10-18 NOTE — Care Management Important Message (Signed)
Important Message  Patient Details  Name: William Byrd MRN: [id number] Date of Birth: 12/14/1931   Medicare Important Message Given:  Yes    [name], RN 10/18/2015, 8:16 AM

## 2015-10-18 NOTE — Care Management (Addendum)
Admitted to [facility] with the diagnosis of pneumonia. Lives with wife Luster LandsbergFannie [phone]). Dr. Burnadette PopLinthavong is primary care physician and has seen him in the last few months. . Discharged from this facility 07/18/15 to [facility]. [facility] past/ [facility] in the past. No Home oxygen. Rolling walker, hoyer lift and   bedside commode in the home. Pres[facility] at [facility] on [address]. Fair-good appetite. No falls. States he takes care of all basic activities of daily living himself, drives. States " a couple of ladies (friends) comes in to help sometimes."  Wife states he doesn't drive, she takes care of him.  Gwenette GreetBrenda S Lysbeth Dicola RN MSN CCM Care Management [phone]

## 2015-10-18 NOTE — Progress Notes (Signed)
[facility] - [place] at [facility]   PATIENT NAME: William Byrd    MR#:  [id number]  DATE OF BIRTH:  April 12, 1931  SUBJECTIVE:   Doing well. REVIEW OF SYSTEMS:   Review of Systems  Constitutional: Negative for chills, fever and weight loss.  HENT: Negative for ear discharge, ear pain and nosebleeds.   Eyes: Negative for blurred vision, pain and discharge.  Respiratory: Positive for cough. Negative for sputum production, shortness of breath, wheezing and stridor.   Cardiovascular: Negative for chest pain, palpitations, orthopnea and PND.  Gastrointestinal: Negative for abdominal pain, diarrhea, nausea and vomiting.  Genitourinary: Negative for frequency and urgency.  Musculoskeletal: Negative for back pain and joint pain.  Neurological: Positive for weakness. Negative for sensory change, speech change and focal weakness.  Psychiatric/Behavioral: Negative for depression and hallucinations. The patient is not nervous/anxious.   All other systems reviewed and are negative.  Tolerating Diet: Tolerating PT:   DRUG ALLERGIES:   Allergies  Allergen Reactions  . Fosinopril Other (See Comments)    Reaction: Hyperkalemia     VITALS:  Blood pressure (!) 133/59, pulse 75, temperature 98.1 F (36.7 C), temperature source Oral, resp. rate 16, height 5' 7.32" (1.71 m), weight 77.1 kg (170 lb), SpO2 99 %.  PHYSICAL EXAMINATION:   Physical Exam  GENERAL:  82 y.o.-year-old patient lying in the bed with no acute distress.  EYES: Pupils equal, round, reactive to light and accommodation. No scleral icterus. Extraocular muscles intact.  HEENT: Head atraumatic, normocephalic. Oropharynx and nasopharynx clear.  NECK:  Supple, no jugular venous distention. No thyroid enlargement, no tenderness.  LUNGS: Normal breath sounds bilaterally, no wheezing, rales, rhonchi. No use of accessory muscles of respiration.  CARDIOVASCULAR: S1, S2 normal. No murmurs, rubs, or gallops.   ABDOMEN: Soft, nontender, nondistended. Bowel sounds present. No organomegaly or mass.  EXTREMITIES: No cyanosis, clubbing or edema b/l.    NEUROLOGIC: Cranial nerves II through XII are intact. No focal Motor or sensory deficits b/l.   PSYCHIATRIC:  patient is alert and oriented x 3.  SKIN: No obvious rash, lesion, or ulcer.   LABORATORY PANEL:  CBC  Recent Labs Lab 10/18/15 0440  WBC 8.1  HGB 9.8*  HCT 28.1*  PLT 230    Chemistries   Recent Labs Lab 10/17/15 1434 10/18/15 0440 10/18/15 1223  NA 135 137  --   K 4.4 3.9  --   CL 99* 104  --   CO2 26 27  --   GLUCOSE 124* 181*  --   BUN 30* 27*  --   CREATININE 2.17* 1.93* 1.84*  CALCIUM 8.9 8.3*  --   MG 2.0  --   --   AST 40  --   --   ALT 15*  --   --   ALKPHOS 74  --   --   BILITOT 0.5  --   --    Cardiac Enzymes  Recent Labs Lab 10/17/15 1434  TROPONINI <0.03   RADIOLOGY:  Dg Chest 2 View  Result Date: 10/17/2015 CLINICAL DATA:  Pt arrived via ems for c/o tremors that started around lunch - no tremors noted at this time - pt from home - recent dx of pneumonia - ems reported decreased breath sounds in left upper lobe - non productive cough . EXAM: CHEST  2 VIEW COMPARISON:  07/15/2015 FINDINGS: Normal cardiac silhouette. There is a bulge along the RIGHT mediastinum at the level of the pulmonary artery. Cannot exclude  adenopathy or mass. Chronic bronchitic markings. Multiple granulomata within the spleen. Mild RIGHT basilar opacity. IMPRESSION: 1. Bulge along the RIGHT mediastinum border. Recommend CT with contrast for further evaluation. 2. RIGHT basilar opacity representing atelectasis or infiltrate. Electronically Signed   By: Genevive BiStewart  Edmunds M.D.   On: 10/17/2015 15:14   Ct Head Wo Contrast  Result Date: 10/17/2015 CLINICAL DATA:  Altered mental status. Difficulty speaking. New onset tremors. EXAM: CT HEAD WITHOUT CONTRAST TECHNIQUE: Contiguous axial images were obtained from the base of the skull through  the vertex without intravenous contrast. COMPARISON:  MRI brain 07/15/2015 FINDINGS: Brain: Moderate generalized atrophy and diffuse white matter disease is again seen bilaterally. There are remote lacunar infarcts within the basal ganglia and mid pons. These are stable. Remote lacunar infarcts are also present within the right cerebellum. The basal ganglia are otherwise stable. The insular ribbon is intact. No acute or focal cortical infarct is present. No acute infarct, hemorrhage, or mass lesion is present. Vascular: Atherosclerotic calcifications are present within the cavernous internal carotid arteries and at the dural margin of the right vertebral artery. There is no focal hyperdense vessel. Skull: The calvarium is intact. No focal lytic or blastic lesions are present. Sinuses/Orbits: The paranasal sinuses and mastoid air cells are clear. Other: No significant extracranial soft tissue lesions are present. IMPRESSION: Negative CT of the head. Electronically Signed   By: Marin Robertshristopher  Mattern M.D.   On: 10/17/2015 15:13   ASSESSMENT AND PLAN:   82 year old male with past medical history of dementia, hypertension, hyperlipidemia, diabetes, glaucoma, peripheral vascular disease, history of previous DVT, chronic systolic CHF, chronic kidney disease stage III who presents to the hospital due to tremors shakes and a mild cough and noted to have a right lower lobe pneumonia.  1. Acute respiratory failure with hypoxia-secondary to pneumonia. -Patient's O2 sats were down to the mid 80s on room air admission. Patient was noted to have a right lower lobe pneumonia on chest x-ray. -Continue IV antibiotics for pneumonia, continue O2 supplementation and weakness tolerated.  2. Pneumonia-community acquired. Patient received IV vancomycin and Zosyn in the emergency room. -cont on Levaquin. Follow blood, sputum cultures. Clinically patient is afebrile and hemodynamically stable.  3. Diabetes type 2 without  complication-continue Lantus and lispro with meals. -Follow blood sugars.  4. History of CHF-clinically patient is not in congestive heart failure. Continue Lasix, continue metoprolol.  5. Diabetic neuropathy-continue local.  6. History of previous DVT- was on  Xarelto. Patient is DVT was over 3 years ago as per the wife. I suspect he can be taken off his Xarelto but I will let primary care MD make that decision -on eliquis for now (due to crCl)  7. Dementia-continue Aricept, Namenda.  8. Hyperlipidemia-continue atorvastatin.  EKG shows NSR. D/c tele box (showing error with V tach)  PT to see pt. Does not ambulate per wife. WC and bed bound D/c in 1-2 days  Case discussed with Care Management/Social Worker. Management plans discussed with the patient, family and they are in agreement.  CODE STATUS: FULL  DVT Prophylaxis: eliquis TOTAL TIME TAKING CARE OF THIS PATIENT:30 minutes.  >50% time spent on counselling and coordination of care  POSSIBLE D/C IN 1-2 DAYS, DEPENDING ON CLINICAL CONDITION.  Note: This dictation was prepared with Dragon dictation along with smaller phrase technology. Any transcriptional errors that result from this process are unintentional.  Holton Sidman M.D on 10/18/2015 at 2:03 PM  Between 7am to 6pm - Pager - [phone]  After  6pm go to [email or web address] - password [email or web address] [place] [facility]  Office  [phone]  CC: Primary care physician; Dion Body, MD

## 2015-10-18 NOTE — Progress Notes (Signed)
Dr Allena KatzPatel made aware that CCMD reports pt heart rate up to 140s-150s not sustained, then back down to 70s-90s, Dr Allena KatzPatel present in pts room, pt asymptomatic, no noted complaints, EKG ordered

## 2015-10-18 NOTE — Care Management (Signed)
Physical therapy evaluation completed. Recommending home with home health and therapy. Discussed home health agencies, Chose [facility]. Wife would like EMS to transport. Discharge 10/19/15 per Dr. Enedina FinnerSona  Patel. Gwenette GreetBrenda S Alan Riles RN MSN CCM Care Management [phone]

## 2015-10-18 NOTE — Evaluation (Signed)
Physical Therapy Evaluation Patient Details Name: William Byrd C Sharpless MRN: [id number] DOB: 09/24/1931 Today's Date: 10/18/2015   History of Present Illness  Pt admitted for pneumonia. Pt with complaints of tremors and has history of dementia, DVT, CKD, OA, HTN, glaucoma, and peripheral neuropathy. Pt confused upon arrival, all history obtained from wife in room.   Clinical Impression  Pt is a pleasant 82 year old male who was admitted for pneumonia. Pt performs bed mobility with mod/max assist. Once able to sit at EOB, pt with poor balance, only able to sit at EOB for a few minutes prior to fatigue and falls uncontrolled back into bed. Pt performed all mobility on room air with O2 sats at 92% and no SOB symptoms. RN aware.  Pt demonstrates deficits with strength/mobility. Attempted transfers performed, however unsuccessful. Secondary to deficits pt will need lift equipment for mobility as it is not safe to transfer to recliner at this time. Discussed with RN. Would benefit from skilled PT to address above deficits and promote optimal return to PLOF. Pt is very close to baseline deficits. Recommend transition to HHPT upon discharge from acute hospitalization.      Follow Up Recommendations Home health PT;Supervision/Assistance - 24 hour    Equipment Recommendations  Hospital bed    Recommendations for Other Services       Precautions / Restrictions Precautions Precautions: Fall Restrictions Weight Bearing Restrictions: No      Mobility  Bed Mobility Overal bed mobility: Needs Assistance Bed Mobility: Supine to Sit     Supine to sit: Mod assist;Max assist;HOB elevated     General bed mobility comments: assist for bed mobility including swinging B LE off bed. Pt able to intitiate reaching for bed rails, however needs cues to maintain grip and assist with pulling trunk upwards to sit at EOB. Once seated at EOB, pt able to sit for approx 2-3 minutes prior to fatigue and then demonstrates  uncontrolled descent back to bed. 2 more attempts to sit at EOB, with heavy assist and R side leaning. Pt able to correct with cues and min assist, however inconsistently. + 2 required for returning back supine and [facility]  Transfers Overall transfer level: Needs assistance Equipment used: Rolling walker (2 wheeled) Transfers: Sit to/from Stand Sit to Stand: Max assist         General transfer comment: 2 attempts made for standing, however pt unable to raise buttocks off bed. Further efforts deferred at this time secondary to safety concerns.  Ambulation/Gait             General Gait Details: not performed  Stairs            Wheelchair Mobility    Modified Rankin (Stroke Patients Only)       Balance Overall balance assessment: Needs assistance Sitting-balance support: Feet supported;Bilateral upper extremity supported Sitting balance-Leahy Scale: Poor Sitting balance - Comments: R lateral leaning noted Postural control: Right lateral lean                                   Pertinent Vitals/Pain Pain Assessment: No/denies pain    Home Living Family/patient expects to be discharged to:: Private residence Living Arrangements: Spouse/significant other Available Help at Discharge: Family;Available 24 hours/day Type of Home: House Home Access: Ramped entrance     Home Layout: One level Home Equipment: Walker - 2 wheels;Bedside commode;Wheelchair - manual (lift equipment)  Prior Function Level of Independence: Needs assistance   Gait / Transfers Assistance Needed: Wife uses lift to transfer from bed to [facility]- per wife, pt does not ambulate now           Hand Dominance        Extremity/Trunk Assessment   Upper Extremity Assessment: Generalized weakness (B UE grossly 3+/5)           Lower Extremity Assessment: Generalized weakness (B LE grossly 3+/5)         Communication   Communication: Expressive  difficulties  Cognition Arousal/Alertness: Awake/alert Behavior During Therapy: WFL for tasks assessed/performed Overall Cognitive Status: History of cognitive impairments - at baseline                      General Comments      Exercises Other Exercises Other Exercises: Supine ther-ex performed including B SLRs, hip abd/add, and heel slides. All ther-ex performed x 10 reps with min assist and heavy cues for correct technique and attention to task.      Assessment/Plan    PT Assessment Patient needs continued PT services  PT Diagnosis Generalized weakness   PT Problem List Decreased strength;Decreased mobility;Decreased balance;Decreased cognition;Decreased knowledge of use of DME;Decreased safety awareness  PT Treatment Interventions DME instruction;Therapeutic exercise;Therapeutic activities;Balance training   PT Goals (Current goals can be found in the Care Plan section) Acute Rehab PT Goals Patient Stated Goal: per family- they would like for pt to work on sit<>stand transfers to improve functional independence PT Goal Formulation: With family Time For Goal Achievement: 11/01/15 Potential to Achieve Goals: Fair    Frequency Min 2X/week   Barriers to discharge        Co-evaluation               End of Session Equipment Utilized During Treatment: Gait belt;Oxygen Activity Tolerance: Patient tolerated treatment well Patient left: in bed;with bed alarm set;with family/visitor present Nurse Communication: Mobility status;Need for lift equipment         Time: 1610-9604 PT Time Calculation (min) (ACUTE ONLY): 27 min   Charges:   PT Evaluation $PT Eval Moderate Complexity: 1 Procedure PT Treatments $Therapeutic Exercise: 8-22 mins   PT G Codes:        Mark Benecke 11/02/15, 5:01 PM  Elizabeth Palau, PT, DPT [phone]

## 2015-10-19 MED ORDER — LEVOFLOXACIN 250 MG PO TABS: 250.0000 mg | ORAL_TABLET | Freq: Every day | ORAL | 0 refills | Status: DC

## 2015-10-19 MED ORDER — RIVAROXABAN 20 MG PO TABS: 20.0000 mg | ORAL_TABLET | Freq: Every day | ORAL | Status: DC

## 2015-10-19 NOTE — Care Management (Signed)
Notified encompass of discharge.

## 2015-10-19 NOTE — Discharge Instructions (Signed)
HHPT/RN/Aide

## 2015-10-19 NOTE — Discharge Summary (Signed)
[facility] - [place] [facility]   PATIENT NAME: William DienesJames Byrd    MR#:  [id number]  DATE OF BIRTH:  11/18/1931  DATE OF ADMISSION:  10/17/2015 ADMITTING PHYSICIAN: Houston SirenVivek J Sainani, MD  DATE OF DISCHARGE: 10/19/15  PRIMARY CARE PHYSICIAN: [name], [name], MD    ADMISSION DIAGNOSIS:  Community acquired pneumonia [J18.9] Acute kidney injury (HCC) [N17.9] Sepsis, due to unspecified organism (HCC) [A41.9]  DISCHARGE DIAGNOSIS:  Acute hypoxic respiratory failure due to Right LL PNA Hypoxia resolved  SECONDARY DIAGNOSIS:   Past Medical History:  Diagnosis Date  . Arthritis   . Background retinopathy   . Cervicalgia   . Chronic kidney disease   . Chronic systolic CHF (congestive heart failure) (HCC)   . Degeneration of lumbar or lumbosacral intervertebral disc   . Dementia   . Diabetes mellitus without complication (HCC)   . DVT (deep venous thrombosis) (HCC)   . Generalized weakness   . Glaucoma   . Hypercholesteremia   . Hypertension   . Long-term insulin use (HCC)   . Peripheral polyneuropathy (HCC)   . Peripheral vascular disease (HCC)   . Spinal stenosis     HOSPITAL COURSE:   82 year old male with past medical history of dementia, hypertension, hyperlipidemia, diabetes, glaucoma, peripheral vascular disease, history of previous DVT, chronic systolic CHF, chronic kidney disease stage III who presents to the hospital due to tremors shakes and a mild cough and noted to have a right lower lobe pneumonia.  1. Acute respiratory failure with hypoxia-secondary to pneumonia. -Patient's O2 sats were down to the mid 80s on room air admission. Patient was noted to have a right lower lobe pneumonia on chest x-ray. -change to po levaquin -sats >925 on RA -no respiratory disress  2. Pneumonia-community acquired. Patient received IV vancomycin and Zosyn in the emergency room. -cont on Levaquin.  - blood, urine cultures negative - Clinically  patient is afebrile and hemodynamically stable.  3. Diabetes type 2 without complication-continue Lantus and lispro with meals.  4. History of CHF-clinically patient is not in congestive heart failure. Continue Lasix, continue metoprolol.  5. Diabetic neuropathy-continue local.  6. History of previous DVT- was on  Xarelto. Patient is DVT was over 3 years ago as per the wife. I suspect he can be taken off his Xarelto but I will let primary care MD make that decision -on eliquis for now (due to crCl)---d/w pharmacist and since Crcl improved ok to be on Xarelto  7. Dementia-continue Aricept, Namenda.  8. Hyperlipidemia-continue atorvastatin.  EKG shows NSR. D/c tele box (showing error with V tach)  PT to see pt. Does not ambulate per wife. WC and bed bound PT recommends HHPT CONSULTS OBTAINED:    DRUG ALLERGIES:   Allergies  Allergen Reactions  . Fosinopril Other (See Comments)    Reaction: Hyperkalemia     DISCHARGE MEDICATIONS:   Current Discharge Medication List    START taking these medications   Details  levofloxacin (LEVAQUIN) 250 MG tablet Take 1 tablet (250 mg total) by mouth daily. Qty: 5 tablet, Refills: 0      CONTINUE these medications which have NOT CHANGED   Details  acetaminophen (TYLENOL) 325 MG tablet Take 650 mg by mouth every 6 (six) hours as needed for mild pain, moderate pain, fever or headache.    amLODipine (NORVASC) 5 MG tablet Take 5 mg by mouth daily.     atorvastatin (LIPITOR) 20 MG tablet Take 20 mg by mouth daily.  benzonatate (TESSALON) 200 MG capsule Take 200 mg by mouth 3 (three) times daily as needed for cough.    cholecalciferol (VITAMIN D) 1000 UNITS tablet Take 1,000 Units by mouth daily.    docusate sodium (STOOL SOFTENER) 100 MG capsule Take 100 mg by mouth daily as needed for mild constipation or moderate constipation.     donepezil (ARICEPT) 10 MG tablet Take 10 mg by mouth at bedtime.    DULoxetine (CYMBALTA) 60  MG capsule Take 60 mg by mouth daily.    finasteride (PROSCAR) 5 MG tablet Take 5 mg by mouth daily.    furosemide (LASIX) 20 MG tablet Take 40 mg by mouth daily. Take 2 tablets in the morning. Take an additional tablet 6 hours later.    insulin NPH Human (HUMULIN N,NOVOLIN N) 100 UNIT/ML injection Inject 25 Units into the skin 2 (two) times daily before a meal.    linagliptin (TRADJENTA) 5 MG TABS tablet Take 5 mg by mouth daily.    memantine (NAMENDA XR) 28 MG CP24 24 hr capsule Take 28 mg by mouth daily.    potassium chloride SA (K-DUR,KLOR-CON) 20 MEQ tablet Take 20 mEq by mouth daily.    pregabalin (LYRICA) 75 MG capsule Take 1 capsule (75 mg total) by mouth 2 (two) times daily. Qty: 30 capsule, Refills: 1    rivaroxaban (XARELTO) 20 MG TABS tablet Take 1 tablet by mouth daily.    Saw Palmetto-Phytosterols (PROSTATE SR) 160-250 MG CAPS Take 1 capsule by mouth daily.    timolol (TIMOPTIC) 0.5 % ophthalmic solution Place 1 drop into both eyes 2 (two) times daily.    feeding supplement, ENSURE ENLIVE, (ENSURE ENLIVE) LIQD Take 237 mLs by mouth 2 (two) times daily between meals. Qty: 237 mL, Refills: 12    metoprolol tartrate (LOPRESSOR) 25 MG tablet Take 1 tablet (25 mg total) by mouth 2 (two) times daily.        If you experience worsening of your admission symptoms, develop shortness of breath, life threatening emergency, suicidal or homicidal thoughts you must seek medical attention immediately by calling 911 or calling your MD immediately  if symptoms less severe.  You Must read complete instructions/literature along with all the possible adverse reactions/side effects for all the Medicines you take and that have been prescribed to you. Take any new Medicines after you have completely understood and accept all the possible adverse reactions/side effects.   Please note  You were cared for by a hospitalist during your hospital stay. If you have any questions about your  discharge medications or the care you received while you were in the hospital after you are discharged, you can call the unit and asked to speak with the hospitalist on call if the hospitalist that took care of you is not available. Once you are discharged, your primary care physician will handle any further medical issues. Please note that NO REFILLS for any discharge medications will be authorized once you are discharged, as it is imperative that you return to your primary care physician (or establish a relationship with a primary care physician if you do not have one) for your aftercare needs so that they can reassess your need for medications and monitor your lab values. Today   SUBJECTIVE  Doing well. No issues per RN   VITAL SIGNS:  Blood pressure (!) 156/61, pulse 74, temperature 97.5 F (36.4 C), temperature source Oral, resp. rate 18, height 5' 7.32" (1.71 m), weight 77.1 kg (170 lb), SpO2 93 %.  I/O:   Intake/Output Summary (Last 24 hours) at 10/19/15 0717 Last data filed at 10/18/15 1700  Gross per 24 hour  Intake              360 ml  Output                0 ml  Net              360 ml    PHYSICAL EXAMINATION:  GENERAL:  82 y.o.-year-old patient lying in the bed with no acute distress.  EYES: Pupils equal, round, reactive to light and accommodation. No scleral icterus. Extraocular muscles intact.  HEENT: Head atraumatic, normocephalic. Oropharynx and nasopharynx clear.  NECK:  Supple, no jugular venous distention. No thyroid enlargement, no tenderness.  LUNGS: Normal breath sounds bilaterally, no wheezing, rales,rhonchi or crepitation. No use of accessory muscles of respiration.  CARDIOVASCULAR: S1, S2 normal. No murmurs, rubs, or gallops.  ABDOMEN: Soft, non-tender, non-distended. Bowel sounds present. No organomegaly or mass.  EXTREMITIES: No pedal edema, cyanosis, or clubbing.  NEUROLOGIC: Cranial nerves II through XII are intact. Muscle strength 5/5 in all extremities.  Sensation intact. Gait not checked.  PSYCHIATRIC: The patient is alert and oriented x 3.  SKIN: No obvious rash, lesion, or ulcer.   DATA REVIEW:   CBC   Recent Labs Lab 10/18/15 0440  WBC 8.1  HGB 9.8*  HCT 28.1*  PLT 230    Chemistries   Recent Labs Lab 10/17/15 1434 10/18/15 0440 10/18/15 1223  NA 135 137  --   K 4.4 3.9  --   CL 99* 104  --   CO2 26 27  --   GLUCOSE 124* 181*  --   BUN 30* 27*  --   CREATININE 2.17* 1.93* 1.84*  CALCIUM 8.9 8.3*  --   MG 2.0  --   --   AST 40  --   --   ALT 15*  --   --   ALKPHOS 74  --   --   BILITOT 0.5  --   --     Microbiology Results   Recent Results (from the past 240 hour(s))  Urine culture     Status: None   Collection Time: 10/17/15  4:02 PM  Result Value Ref Range Status   Specimen Description URINE, CATHETERIZED  Final   Special Requests NONE  Final   Culture NO GROWTH Performed at [facility]   Final   Report Status 10/18/2015 FINAL  Final  Blood Culture (routine x 2)     Status: None (Preliminary result)   Collection Time: 10/17/15  4:06 PM  Result Value Ref Range Status   Specimen Description BLOOD RT HAND  Final   Special Requests BOTTLES DRAWN AEROBIC AND ANAEROBIC 1CC  Final   Culture NO GROWTH < 24 HOURS  Final   Report Status PENDING  Incomplete  Blood Culture (routine x 2)     Status: None (Preliminary result)   Collection Time: 10/17/15  4:20 PM  Result Value Ref Range Status   Specimen Description BLOOD RT HAND  Final   Special Requests BOTTLES DRAWN AEROBIC AND ANAEROBIC 3CC  Final   Culture NO GROWTH < 24 HOURS  Final   Report Status PENDING  Incomplete    RADIOLOGY:  Dg Chest 2 View  Result Date: 10/17/2015 CLINICAL DATA:  Pt arrived via ems for c/o tremors that started around lunch - no tremors noted at this time - pt  from home - recent dx of pneumonia - ems reported decreased breath sounds in left upper lobe - non productive cough . EXAM: CHEST  2 VIEW COMPARISON:  07/15/2015  FINDINGS: Normal cardiac silhouette. There is a bulge along the RIGHT mediastinum at the level of the pulmonary artery. Cannot exclude adenopathy or mass. Chronic bronchitic markings. Multiple granulomata within the spleen. Mild RIGHT basilar opacity. IMPRESSION: 1. Bulge along the RIGHT mediastinum border. Recommend CT with contrast for further evaluation. 2. RIGHT basilar opacity representing atelectasis or infiltrate. Electronically Signed   By: Genevive Bi M.D.   On: 10/17/2015 15:14   Ct Head Wo Contrast  Result Date: 10/17/2015 CLINICAL DATA:  Altered mental status. Difficulty speaking. New onset tremors. EXAM: CT HEAD WITHOUT CONTRAST TECHNIQUE: Contiguous axial images were obtained from the base of the skull through the vertex without intravenous contrast. COMPARISON:  MRI brain 07/15/2015 FINDINGS: Brain: Moderate generalized atrophy and diffuse white matter disease is again seen bilaterally. There are remote lacunar infarcts within the basal ganglia and mid pons. These are stable. Remote lacunar infarcts are also present within the right cerebellum. The basal ganglia are otherwise stable. The insular ribbon is intact. No acute or focal cortical infarct is present. No acute infarct, hemorrhage, or mass lesion is present. Vascular: Atherosclerotic calcifications are present within the cavernous internal carotid arteries and at the dural margin of the right vertebral artery. There is no focal hyperdense vessel. Skull: The calvarium is intact. No focal lytic or blastic lesions are present. Sinuses/Orbits: The paranasal sinuses and mastoid air cells are clear. Other: No significant extracranial soft tissue lesions are present. IMPRESSION: Negative CT of the head. Electronically Signed   By: Marin Roberts M.D.   On: 10/17/2015 15:13     Management plans discussed with the patient, family and they are in agreement.  CODE STATUS:     Code Status Orders        Start     Ordered    10/17/15 2019  Full code  Continuous     10/17/15 2018    Code Status History    Date Active Date Inactive Code Status Order ID Comments User Context   07/15/2015  8:30 AM 07/18/2015  9:00 PM Full Code [id number]  Auburn Bilberry, MD ED   12/28/2014  9:50 PM 12/31/2014  4:38 PM Full Code [id number]  Altamese Dilling, MD Inpatient   09/05/2014  4:09 AM 09/08/2014  6:11 PM Full Code [id number]  Crissie Figures, MD ED    Advance Directive Documentation   Flowsheet Row Most Recent Value  Type of Advance Directive  Healthcare Power of Attorney  Pre-existing out of facility DNR order (yellow form or pink MOST form)  No data  "MOST" Form in Place?  No data      TOTAL TIME TAKING CARE OF THIS PATIENT: 40 minutes.    Denham Mose M.D on 10/19/2015 at 7:17 AM  Between 7am to 6pm - Pager - [phone] After 6pm go to [email or web address] - password [email or web address] [facility]  [facility]  Office  [phone]  CC: Primary care physician; Marisue Ivan, MD

## 2015-10-19 NOTE — Care Management (Signed)
Medical necessity completed

## 2015-10-19 NOTE — Progress Notes (Signed)
Pt being discharged home with home health, discharge instructions reviewed with pt and wife, states understanding, awaiting EMS for transport, pt with no complaints, no distress or discomfort noted

## 2015-10-22 LAB — CULTURE, BLOOD (ROUTINE X 2)
CULTURE: NO GROWTH
CULTURE: NO GROWTH

## 2015-11-09 ENCOUNTER — Encounter: Payer: Self-pay | Admitting: Emergency Medicine

## 2015-11-09 ENCOUNTER — Emergency Department: Payer: Medicare Other

## 2015-11-09 ENCOUNTER — Inpatient Hospital Stay
Admission: EM | Admit: 2015-11-09 | Discharge: 2015-11-15 | DRG: 871 | Disposition: A | Discharge status: Skilled Nursing Facility | Payer: Medicare Other | Attending: Internal Medicine | Admitting: Internal Medicine

## 2015-11-09 DIAGNOSIS — R140 Abdominal distension (gaseous): Secondary | ICD-10-CM | POA: Diagnosis present

## 2015-11-09 DIAGNOSIS — K5900 Constipation, unspecified: Secondary | ICD-10-CM | POA: Diagnosis present

## 2015-11-09 DIAGNOSIS — D649 Anemia, unspecified: Secondary | ICD-10-CM | POA: Diagnosis present

## 2015-11-09 DIAGNOSIS — K567 Ileus, unspecified: Secondary | ICD-10-CM | POA: Diagnosis present

## 2015-11-09 DIAGNOSIS — E1142 Type 2 diabetes mellitus with diabetic polyneuropathy: Secondary | ICD-10-CM | POA: Diagnosis present

## 2015-11-09 DIAGNOSIS — Z79899 Other long term (current) drug therapy: Secondary | ICD-10-CM

## 2015-11-09 DIAGNOSIS — I739 Peripheral vascular disease, unspecified: Secondary | ICD-10-CM | POA: Diagnosis present

## 2015-11-09 DIAGNOSIS — I5022 Chronic systolic (congestive) heart failure: Secondary | ICD-10-CM | POA: Diagnosis present

## 2015-11-09 DIAGNOSIS — I130 Hypertensive heart and chronic kidney disease with heart failure and stage 1 through stage 4 chronic kidney disease, or unspecified chronic kidney disease: Secondary | ICD-10-CM | POA: Diagnosis present

## 2015-11-09 DIAGNOSIS — M5136 Other intervertebral disc degeneration, lumbar region: Secondary | ICD-10-CM | POA: Diagnosis present

## 2015-11-09 DIAGNOSIS — E11319 Type 2 diabetes mellitus with unspecified diabetic retinopathy without macular edema: Secondary | ICD-10-CM | POA: Diagnosis present

## 2015-11-09 DIAGNOSIS — M542 Cervicalgia: Secondary | ICD-10-CM | POA: Diagnosis present

## 2015-11-09 DIAGNOSIS — Z22322 Carrier or suspected carrier of Methicillin resistant Staphylococcus aureus: Secondary | ICD-10-CM

## 2015-11-09 DIAGNOSIS — Y95 Nosocomial condition: Secondary | ICD-10-CM | POA: Diagnosis present

## 2015-11-09 DIAGNOSIS — N179 Acute kidney failure, unspecified: Secondary | ICD-10-CM | POA: Diagnosis present

## 2015-11-09 DIAGNOSIS — Z23 Encounter for immunization: Secondary | ICD-10-CM | POA: Diagnosis not present

## 2015-11-09 DIAGNOSIS — Z794 Long term (current) use of insulin: Secondary | ICD-10-CM

## 2015-11-09 DIAGNOSIS — M1990 Unspecified osteoarthritis, unspecified site: Secondary | ICD-10-CM | POA: Diagnosis present

## 2015-11-09 DIAGNOSIS — Z86718 Personal history of other venous thrombosis and embolism: Secondary | ICD-10-CM

## 2015-11-09 DIAGNOSIS — F0390 Unspecified dementia without behavioral disturbance: Secondary | ICD-10-CM | POA: Diagnosis present

## 2015-11-09 DIAGNOSIS — E1122 Type 2 diabetes mellitus with diabetic chronic kidney disease: Secondary | ICD-10-CM | POA: Diagnosis present

## 2015-11-09 DIAGNOSIS — Z888 Allergy status to other drugs, medicaments and biological substances status: Secondary | ICD-10-CM

## 2015-11-09 DIAGNOSIS — E785 Hyperlipidemia, unspecified: Secondary | ICD-10-CM | POA: Diagnosis present

## 2015-11-09 DIAGNOSIS — N289 Disorder of kidney and ureter, unspecified: Secondary | ICD-10-CM

## 2015-11-09 DIAGNOSIS — E7800 Pure hypercholesterolemia, unspecified: Secondary | ICD-10-CM | POA: Diagnosis present

## 2015-11-09 DIAGNOSIS — Z87891 Personal history of nicotine dependence: Secondary | ICD-10-CM

## 2015-11-09 DIAGNOSIS — J9601 Acute respiratory failure with hypoxia: Secondary | ICD-10-CM | POA: Diagnosis present

## 2015-11-09 DIAGNOSIS — G629 Polyneuropathy, unspecified: Secondary | ICD-10-CM | POA: Diagnosis present

## 2015-11-09 DIAGNOSIS — J189 Pneumonia, unspecified organism: Secondary | ICD-10-CM | POA: Diagnosis present

## 2015-11-09 DIAGNOSIS — Z981 Arthrodesis status: Secondary | ICD-10-CM

## 2015-11-09 DIAGNOSIS — A419 Sepsis, unspecified organism: Secondary | ICD-10-CM | POA: Diagnosis not present

## 2015-11-09 DIAGNOSIS — N189 Chronic kidney disease, unspecified: Secondary | ICD-10-CM | POA: Diagnosis present

## 2015-11-09 DIAGNOSIS — Z833 Family history of diabetes mellitus: Secondary | ICD-10-CM

## 2015-11-09 DIAGNOSIS — M4800 Spinal stenosis, site unspecified: Secondary | ICD-10-CM | POA: Diagnosis present

## 2015-11-09 DIAGNOSIS — Z7901 Long term (current) use of anticoagulants: Secondary | ICD-10-CM

## 2015-11-09 DIAGNOSIS — H409 Unspecified glaucoma: Secondary | ICD-10-CM | POA: Diagnosis present

## 2015-11-09 LAB — CBC WITH DIFFERENTIAL/PLATELET
BASOS PCT: 1 %
Basophils Absolute: 0.1 10*3/uL (ref 0–0.1)
EOS ABS: 0.1 10*3/uL (ref 0–0.7)
EOS PCT: 1 %
HCT: 34.3 % — ABNORMAL LOW (ref 40.0–52.0)
Hemoglobin: 11.7 g/dL — ABNORMAL LOW (ref 13.0–18.0)
LYMPHS ABS: 0.7 10*3/uL — AB (ref 1.0–3.6)
Lymphocytes Relative: 5 %
MCH: 30.7 pg (ref 26.0–34.0)
MCHC: 34.1 g/dL (ref 32.0–36.0)
MCV: 90.1 fL (ref 80.0–100.0)
MONO ABS: 1 10*3/uL (ref 0.2–1.0)
MONOS PCT: 7 %
Neutro Abs: 11.9 10*3/uL — ABNORMAL HIGH (ref 1.4–6.5)
Neutrophils Relative %: 86 %
PLATELETS: 275 10*3/uL (ref 150–440)
RBC: 3.81 MIL/uL — ABNORMAL LOW (ref 4.40–5.90)
RDW: 15.5 % — AB (ref 11.5–14.5)
WBC: 13.7 10*3/uL — ABNORMAL HIGH (ref 3.8–10.6)

## 2015-11-09 LAB — COMPREHENSIVE METABOLIC PANEL
ALK PHOS: 86 U/L (ref 38–126)
ALT: 14 U/L — AB (ref 17–63)
AST: 25 U/L (ref 15–41)
Albumin: 3.4 g/dL — ABNORMAL LOW (ref 3.5–5.0)
Anion gap: 8 (ref 5–15)
BUN: 24 mg/dL — AB (ref 6–20)
CALCIUM: 9.3 mg/dL (ref 8.9–10.3)
CO2: 28 mmol/L (ref 22–32)
CREATININE: 2.05 mg/dL — AB (ref 0.61–1.24)
Chloride: 103 mmol/L (ref 101–111)
GFR calc non Af Amer: 29 mL/min — ABNORMAL LOW (ref >60)
GFR, EST AFRICAN AMERICAN: 33 mL/min — AB (ref >60)
GLUCOSE: 104 mg/dL — AB (ref 65–99)
Potassium: 4.5 mmol/L (ref 3.5–5.1)
SODIUM: 139 mmol/L (ref 135–145)
Total Bilirubin: 0.7 mg/dL (ref 0.3–1.2)
Total Protein: 8.5 g/dL — ABNORMAL HIGH (ref 6.5–8.1)

## 2015-11-09 LAB — URINALYSIS COMPLETE WITH MICROSCOPIC (ARMC ONLY)
BACTERIA UA: NONE SEEN
Bilirubin Urine: NEGATIVE
GLUCOSE, UA: NEGATIVE mg/dL
HGB URINE DIPSTICK: NEGATIVE
Ketones, ur: NEGATIVE mg/dL
Leukocytes, UA: NEGATIVE
NITRITE: NEGATIVE
Protein, ur: 100 mg/dL — AB
Specific Gravity, Urine: 1.013 (ref 1.005–1.030)
Squamous Epithelial / LPF: NONE SEEN
pH: 7 (ref 5.0–8.0)

## 2015-11-09 LAB — LIPASE, BLOOD: Lipase: 21 U/L (ref 11–51)

## 2015-11-09 LAB — BLOOD GAS, VENOUS
ACID-BASE EXCESS: 7.4 mmol/L — AB (ref 0.0–2.0)
BICARBONATE: 33.9 mmol/L — AB (ref 20.0–28.0)
PATIENT TEMPERATURE: 37
pCO2, Ven: 56 mmHg (ref 44.0–60.0)
pH, Ven: 7.39 (ref 7.250–7.430)
pO2, Ven: <31 mmHg — CL (ref 32.0–45.0)

## 2015-11-09 LAB — LACTIC ACID, PLASMA
LACTIC ACID, VENOUS: 2.3 mmol/L — AB (ref 0.5–1.9)
LACTIC ACID, VENOUS: 1.5 mmol/L (ref 0.5–1.9)

## 2015-11-09 LAB — TROPONIN I: Troponin I: <0.03 ng/mL (ref <0.03)

## 2015-11-09 MED ORDER — LEVOFLOXACIN IN D5W 500 MG/100ML IV SOLN
500.0000 mg | Freq: Once | INTRAVENOUS | Status: AC
  Administered 2015-11-09: 500 mg via INTRAVENOUS

## 2015-11-09 MED ORDER — VANCOMYCIN HCL IN DEXTROSE 1-5 GM/200ML-% IV SOLN
1000.0000 mg | Freq: Once | INTRAVENOUS | Status: AC
  Administered 2015-11-09: 1000 mg via INTRAVENOUS
  Filled 2015-11-09: qty 200

## 2015-11-09 MED ORDER — PIPERACILLIN-TAZOBACTAM 3.375 G IVPB 30 MIN
3.3750 g | Freq: Once | INTRAVENOUS | Status: AC
  Administered 2015-11-09: 3.375 g via INTRAVENOUS
  Filled 2015-11-09: qty 50

## 2015-11-09 MED ORDER — SODIUM CHLORIDE 0.9 % IV BOLUS (SEPSIS): 1000.0000 mL | Freq: Once | INTRAVENOUS | Status: DC

## 2015-11-09 MED ORDER — LEVOFLOXACIN IN D5W 250 MG/50ML IV SOLN: 250.0000 mg | INTRAVENOUS | Status: DC

## 2015-11-09 MED ORDER — LEVOFLOXACIN IN D5W 500 MG/100ML IV SOLN
INTRAVENOUS | Status: AC
  Administered 2015-11-09: 500 mg via INTRAVENOUS
  Filled 2015-11-09: qty 100

## 2015-11-09 MED ORDER — ACETAMINOPHEN 650 MG RE SUPP
975.0000 mg | Freq: Once | RECTAL | Status: AC
  Administered 2015-11-09: 975 mg via RECTAL

## 2015-11-09 MED ORDER — SODIUM CHLORIDE 0.9 % IV BOLUS (SEPSIS)
500.0000 mL | Freq: Once | INTRAVENOUS | Status: AC
  Administered 2015-11-09: 500 mL via INTRAVENOUS

## 2015-11-09 NOTE — ED Notes (Signed)
Soiled brief changed at this time. 

## 2015-11-09 NOTE — ED Notes (Signed)
Admitting MD at bedside.

## 2015-11-09 NOTE — Progress Notes (Signed)
RN called again to request IV levofloxacin. I informed her it was stocked in Pyxis. She looked and found it in Pyx[facility].

## 2015-11-09 NOTE — ED Notes (Signed)
Awaiting pharmacy to send medication.

## 2015-11-09 NOTE — ED Provider Notes (Signed)
[facility] Emergency Department Provider Note  ____________________________________________  Time seen: Approximately 3:51 PM  I have reviewed the triage vital signs and the nursing notes.   HISTORY  Chief Complaint Code Sepsis    HPI William Byrd is a 82 y.o. male with a history of advanced dementia, HTN, HL, CHF, CKD sent from his nursing home for several episodes of vomiting today.The patient is unable to give me any information except for his name. He reports that he is not hurting anywhere, but he cannot give me any additional history.   Past Medical History:  Diagnosis Date  . Arthritis   . Background retinopathy   . Cervicalgia   . Chronic kidney disease   . Chronic systolic CHF (congestive heart failure) (HCC)   . Degeneration of lumbar or lumbosacral intervertebral disc   . Dementia   . Diabetes mellitus without complication (HCC)   . DVT (deep venous thrombosis) (HCC)   . Generalized weakness   . Glaucoma   . Hypercholesteremia   . Hypertension   . Long-term insulin use (HCC)   . Peripheral polyneuropathy (HCC)   . Peripheral vascular disease (HCC)   . Spinal stenosis     Patient Active Problem List   Diagnosis Date Noted  . Pneumonia 10/17/2015  . CVA (cerebral infarction) 07/15/2015  . Dehydration 12/28/2014  . Acute on chronic renal failure (HCC) 12/28/2014  . Generalized weakness 12/28/2014  . Acute renal failure (ARF) (HCC) 12/28/2014  . Lower extremity weakness 09/05/2014  . DVT of lower extremity, bilateral (HCC) 09/05/2014  . DM2 (diabetes mellitus, type 2) (HCC) 09/05/2014  . HTN (hypertension) 09/05/2014  . Dementia 09/05/2014  . CKD (chronic kidney disease) stage 3, GFR 30-59 ml/min 09/05/2014  . HLD (hyperlipidemia) 09/05/2014  . Spinal stenosis 09/05/2014    Past Surgical History:  Procedure Laterality Date  . c-spine fusion N/A     Current Outpatient Rx  . Order #: [id number] Class: Historical Med  .  Order #: [id number] Class: Historical Med  . Order #: [id number] Class: Historical Med  . Order #: [id number] Class: Historical Med  . Order #: [id number] Class: Historical Med  . Order #: [id number] Class: Historical Med  . Order #: [id number] Class: Historical Med  . Order #: [id number] Class: Historical Med  . Order #: [id number] Class: No Print  . Order #: [id number] Class: Historical Med  . Order #: [id number] Class: Historical Med  . Order #: [id number] Class: Historical Med  . Order #: [id number] Class: Normal  . Order #: [id number] Class: Historical Med  . Order #: [id number] Class: Historical Med  . Order #: [id number] Class: No Print  . Order #: [id number] Class: Historical Med  . Order #: [id number] Class: Print  . Order #: [id number] Class: Historical Med  . Order #: [id number] Class: Historical Med  . Order #: [id number] Class: Historical Med    Allergies Fosinopril  Family History  Problem Relation Age of Onset  . Diabetes Mother   . Liver cancer Father   . Liver disease Father   . Arthritis Sister     Social History Social History  Substance Use Topics  . Smoking status: Former Smoker    Packs/day: 0.50    Years: 30.00  . Smokeless tobacco: Never Used  . Alcohol use No    Review of Systems Unable to obtain due to patient dementia.  ____________________________________________   PHYSICAL EXAM:  VITAL SIGNS: ED Triage Vitals  Enc Vitals Group     BP 11/09/15 1545 (!) 147/72     Pulse Rate 11/09/15 1545 (!) 117  Resp 11/09/15 1545 17     Temp 11/09/15 1549 (!) 102.9 F (39.4 C)     Temp Source 11/09/15 1549 Rectal     SpO2 11/09/15 1545 93 %     Weight 11/09/15 1545 180 lb (81.6 kg)     Height 11/09/15 1545 5\' 10"  (1.778 m)     Head Circumference --      Peak Flow --      Pain Score --      Pain Loc --      Pain Edu? --      Excl. in GC? --     Constitutional: Patient is alert and oriented to person only. He is chronically ill appearing but nontoxic. Eyes: Conjunctivae  are normal.  EOMI. No scleral icterus. No eye discharge. Head: Atraumatic. Nose: No congestion/rhinnorhea. Mouth/Throat: Mucous membranes are dry.  Neck: No stridor.  Supple.  No meningismus. No JVD. Cardiovascular: Fast rate, regular rhythm. No murmurs, rubs or gallops.  Respiratory: Normal respiratory effort.  No accessory muscle use or retractions. Lungs CTAB.  No wheezes, rales or ronchi. Oxygen saturations 92-94% on room air. Gastrointestinal: Soft, nontender but mildly distended.  No guarding or rebound.  No peritoneal signs. Musculoskeletal: No LE edema. No ttp in the calves or palpable cords.  Negative Homan's sign. Neurologic:  Alert to person only.  Speech is clear when he answers questions.  Face and smile are symmetric.  EOMI.  Moves all extremities well. Skin:  Skin is warm, dry and intact. No rash noted. Psychiatric: Mood and affect are normal. Speech and behavior are normal.  Normal judgement.  ____________________________________________   LABS (all labs ordered are listed, but only abnormal results are displayed)  Labs Reviewed  COMPREHENSIVE METABOLIC PANEL - Abnormal; Notable for the following:       Result Value   Glucose, Bld 104 (*)    BUN 24 (*)    Creatinine, Ser 2.05 (*)    Total Protein 8.5 (*)    Albumin 3.4 (*)    ALT 14 (*)    GFR calc non Af Amer 29 (*)    GFR calc Af Amer 33 (*)    All other components within normal limits  CBC WITH DIFFERENTIAL/PLATELET - Abnormal; Notable for the following:    WBC 13.7 (*)    RBC 3.81 (*)    Hemoglobin 11.7 (*)    HCT 34.3 (*)    RDW 15.5 (*)    Neutro Abs 11.9 (*)    Lymphs Abs 0.7 (*)    All other components within normal limits  LACTIC ACID, PLASMA - Abnormal; Notable for the following:    Lactic Acid, Venous 2.3 (*)    All other components within normal limits  BLOOD GAS, VENOUS - Abnormal; Notable for the following:    pO2, Ven <31.0 (*)    Bicarbonate 33.9 (*)    Acid-Base Excess 7.4 (*)    All  other components within normal limits  URINALYSIS COMPLETEWITH MICROSCOPIC ([facility] ONLY) - Abnormal; Notable for the following:    Color, Urine YELLOW (*)    APPearance CLEAR (*)    Protein, ur 100 (*)    All other components within normal limits  CULTURE, BLOOD (ROUTINE X 2)  CULTURE, BLOOD (ROUTINE X 2)  URINE CULTURE  LACTIC ACID, PLASMA  LIPASE, BLOOD  TROPONIN I   ____________________________________________  EKG  ED ECG REPORT I, Rockne Menghini, the attending physician, personally viewed and interpreted this ECG.   Date:  11/09/2015  EKG Time: 1605  Rate: 117  Rhythm: sinus tachycardia  Axis: nl  Intervals:none  ST&T Change: No ST elevation.  ____________________________________________  RADIOLOGY  Ct Abdomen Pelvis Wo Contrast  Result Date: 11/09/2015 CLINICAL DATA:  82 year old male with history of vomiting today. Generalized malaise. Abdominal distention. EXAM: CT ABDOMEN AND PELVIS WITHOUT CONTRAST TECHNIQUE: Multidetector CT imaging of the abdomen and pelvis was performed following the standard protocol without IV contrast. COMPARISON:  No priors. FINDINGS: Lower chest: Atherosclerotic calcifications in the left circumflex and right coronary arteries. Extensive peribronchovascular airspace disease in the left lower lobe, concerning for sequela of recent aspiration. Hepatobiliary: The numerous calcified granulomas are noted throughout the liver. No discrete cystic or solid hepatic lesions are noted. Unenhanced appearance of the gallbladder is normal. Pancreas: No pancreatic mass or peripancreatic inflammatory changes on today's noncontrast CT examination. Spleen: Innumerable calcified granulomas are noted throughout the spleen. Adrenals/Urinary Tract: Nonobstructive calculi are noted within the lower pole collecting systems of the kidneys bilaterally, measuring up to 4 mm on the left side. No calculi are noted along the course of either ureter or within the lumen of  the urinary bladder. There is no hydroureteronephrosis. Unenhanced appearance of the kidneys is otherwise unremarkable. Unenhanced appearance of the urinary bladder is normal. Bilateral adrenal glands are normal in appearance. Stomach/Bowel: Unenhanced appearance of the stomach is normal. There is no pathologic dilatation of small bowel or colon. The appendix is not confidently identified and may be surgically absent. Regardless, there are no inflammatory changes noted adjacent to the cecum to suggest the presence of an acute appendicitis at this time. Large volume of stool in the rectal vault. Vascular/Lymphatic: Aortic atherosclerosis, without evidence of aneurysm in the abdominal or pelvic vasculature. No lymphadenopathy noted in the abdomen or pelvis. Reproductive: Prostate gland and seminal vesicles are unremarkable in appearance. Other: No significant volume of ascites.  No pneumoperitoneum. Musculoskeletal: There are no aggressive appearing lytic or blastic lesions noted in the visualized portions of the skeleton. IMPRESSION: 1. Findings in the left lower lobe, concerning for either sequela of recent aspiration or developing bronchopneumonia. 2. No acute findings in the abdomen or pelvis. 3. Nonobstructive calculi are noted within the collecting systems of the kidneys bilaterally measuring up to 4 mm in the lower pole collecting system of left kidney. However, there are no ureteral stones or findings to suggest urinary tract obstruction at this time. 4. Aortic atherosclerosis, in addition to at least 2 vessel coronary artery disease. 5. Large volume of stool in the rectal vault may suggest constipation. 6. Sequela of old granulomatous disease, as above. Electronically Signed   By: Trudie Reedaniel  Entrikin M.D.   On: 11/09/2015 19:44   Dg Chest Port 1 View  Result Date: 11/09/2015 CLINICAL DATA:  New onset fever today.  History of pneumonia. EXAM: PORTABLE CHEST 1 VIEW COMPARISON:  PA and lateral chest 10/17/2015  and 07/15/2015. CT chest 12/28/2014. FINDINGS: Heart size is upper normal. Lungs are clear. No pneumothorax or pleural effusion. Splenic calcifications noted and unchanged. IMPRESSION: No acute disease. Electronically Signed   By: Drusilla Kannerhomas  Dalessio M.D.   On: 11/09/2015 16:14    ____________________________________________   PROCEDURES  Procedure(s) performed: None  Procedures  Critical Care performed: Yes ____________________________________________   INITIAL IMPRESSION / ASSESSMENT AND PLAN / ED COURSE  Pertinent labs & imaging results that were available during my care of the patient were reviewed by me and considered in my medical decision making (see chart for details).  82  y.o. male, nursing home patient with advanced dementia who cannot give me much history, sent from his nursing home today for vomiting and has abdominal distention on my examination. The nurses report that they changed a large normal consistency nonbloody bowel movement on arrival. I am concerned about some kind of infection with sepsis, and we'll cover him empirically but we'll look for intra-abdominal pathology. I will have to do a CT with no contrast because the patient cannot drink reliably, and has a history of CKD which will preclude IV contrast. In addition will get a urinalysis and a chest x-ray. He is not meningitic. He will be covered with vancomycin and Zosyn in the meantime. I will give him fluids with continued evaluation given his history of CHF. The patient will require admission to the hospital for further evaluation and treatment.  CRITICAL CARE Performed by: Rockne Menghini   Total critical care time: 35 minutes  Critical care time was exclusive of separately billable procedures and treating other patients.  Critical care was necessary to treat or prevent imminent or life-threatening deterioration.  Critical care was time spent personally by me on the following activities: development of  treatment plan with patient and/or surrogate as well as nursing, discussions with consultants, evaluation of patient's response to treatment, examination of patient, obtaining history from patient or surrogate, ordering and performing treatments and interventions, ordering and review of laboratory studies, ordering and review of radiographic studies, pulse oximetry and re-evaluation of patient's condition.  ____________________________________________  FINAL CLINICAL IMPRESSION(S) / ED DIAGNOSES  Final diagnoses:  Abdominal distention  Sepsis, due to unspecified organism (HCC)  HCAP (healthcare-associated pneumonia)  Constipation, unspecified constipation type  Acute on chronic renal insufficiency (HCC)    Clinical Course  Comment By Time  I have checked on the patient multiple times, and he is now accompanied by his wife. She does report that he is more quiet than usual and not at baseline mental status. His vital signs have significantly improved and he is no longer tachycardic but continues to maintain a normal blood pressure. I am awaiting the results of the CT abdomen for final admission. Rockne Menghini, MD 09/29 1902   ----------------------------------------- 8:57 PM on 11/09/2015 -----------------------------------------  The patient continues to be hemodynamically stable with an oxygenation of 99% on room air. His CT abdomen shows constipation but no other acute intra-abdominal process. There are findings in the left lower lobe of possible aspiration pneumonia. The patient has no artery been covered empirically with antibiotics. At this time I'll plan admission to the hospital.   NEW MEDICATIONS STARTED DURING THIS VISIT:  New Prescriptions   No medications on file      Rockne Menghini, MD 11/09/15 2101

## 2015-11-09 NOTE — ED Triage Notes (Signed)
Per [facility], patient comes from home. Patient is bed bound. Hx of diabetes and neuropathy. Patient began vomiting today and states, "I just don't feel well". Patient is A&O to self.

## 2015-11-09 NOTE — Progress Notes (Addendum)
Pharmacy Antibiotic Note  William Byrd is a 82 y.o. male admitted on 11/09/2015 with sepsis.  Pharmacy has been consulted for vancomycin and pip/tazo dosing.  Plan: Pt received pip/tazo 3.375 g IV and vancomycin 1000 mg IV x 1 in the ED. Per admission note, patient will begin treatment for CAP with levofloxacin. Will receive levofloxacin 500 mg IV once followed by levofloxacin 250 mg IV q 24.  Height: 5\' 10"  (177.8 cm) Weight: 180 lb (81.6 kg) IBW/kg (Calculated) : 73  Temp (24hrs), Avg:101.8 F (38.8 C), Min:100.6 F (38.1 C), Max:102.9 F (39.4 C)   Recent Labs Lab 11/09/15 1549 11/09/15 1841  WBC 13.7*  --   CREATININE 2.05*  --   LATICACIDVEN 2.3* 1.5    Estimated Creatinine Clearance: 28.7 mL/min (by C-G formula based on SCr of 2.05 mg/dL (H)).    Allergies  Allergen Reactions  . Fosinopril Other (See Comments)    Reaction: Hyperkalemia     Antimicrobials this admission: Vanc 9/29 >> once Pip/tazo 9-29 >> once Levofloxacin 9/29 >>  Microbiology results: 9/29 BCx: sent 9/29 UCx: sent   Thank you for allowing pharmacy to be a part of this patient's care.  Horris LatinoHolly Taleshia Luff, PharmD Pharmacy Resident 11/09/2015 8:31 PM

## 2015-11-10 LAB — GLUCOSE, CAPILLARY
GLUCOSE-CAPILLARY: 138 mg/dL — AB (ref 65–99)
GLUCOSE-CAPILLARY: 187 mg/dL — AB (ref 65–99)
Glucose-Capillary: 134 mg/dL — ABNORMAL HIGH (ref 65–99)
Glucose-Capillary: 121 mg/dL — ABNORMAL HIGH (ref 65–99)
Glucose-Capillary: 308 mg/dL — ABNORMAL HIGH (ref 65–99)

## 2015-11-10 LAB — COMPREHENSIVE METABOLIC PANEL
ALBUMIN: 2.7 g/dL — AB (ref 3.5–5.0)
ALT: 12 U/L — ABNORMAL LOW (ref 17–63)
AST: 18 U/L (ref 15–41)
Alkaline Phosphatase: 67 U/L (ref 38–126)
Anion gap: 6 (ref 5–15)
BILIRUBIN TOTAL: 0.7 mg/dL (ref 0.3–1.2)
BUN: 24 mg/dL — AB (ref 6–20)
CO2: 27 mmol/L (ref 22–32)
Calcium: 8.5 mg/dL — ABNORMAL LOW (ref 8.9–10.3)
Chloride: 106 mmol/L (ref 101–111)
Creatinine, Ser: 1.91 mg/dL — ABNORMAL HIGH (ref 0.61–1.24)
GFR calc Af Amer: 36 mL/min — ABNORMAL LOW (ref >60)
GFR calc non Af Amer: 31 mL/min — ABNORMAL LOW (ref >60)
GLUCOSE: 140 mg/dL — AB (ref 65–99)
POTASSIUM: 3.7 mmol/L (ref 3.5–5.1)
Sodium: 139 mmol/L (ref 135–145)
TOTAL PROTEIN: 7.1 g/dL (ref 6.5–8.1)

## 2015-11-10 LAB — URINE CULTURE: CULTURE: NO GROWTH

## 2015-11-10 LAB — CBC
HEMATOCRIT: 28.3 % — AB (ref 40.0–52.0)
HEMOGLOBIN: 9.6 g/dL — AB (ref 13.0–18.0)
MCH: 30.9 pg (ref 26.0–34.0)
MCHC: 33.9 g/dL (ref 32.0–36.0)
MCV: 91.1 fL (ref 80.0–100.0)
Platelets: 246 10*3/uL (ref 150–440)
RBC: 3.11 MIL/uL — AB (ref 4.40–5.90)
RDW: 15.5 % — AB (ref 11.5–14.5)
WBC: 10.5 10*3/uL (ref 3.8–10.6)

## 2015-11-10 MED ORDER — SENNOSIDES-DOCUSATE SODIUM 8.6-50 MG PO TABS: 1.0000 | ORAL_TABLET | Freq: Every evening | ORAL | Status: DC | PRN

## 2015-11-10 MED ORDER — VITAMIN D 1000 UNITS PO TABS
1000.0000 [IU] | ORAL_TABLET | Freq: Every day | ORAL | Status: DC
  Administered 2015-11-10 – 2015-11-15 (×6): 1000 [IU] via ORAL
  Filled 2015-11-10 (×6): qty 1

## 2015-11-10 MED ORDER — ACETAMINOPHEN 325 MG PO TABS: 650.0000 mg | ORAL_TABLET | Freq: Four times a day (QID) | ORAL | Status: DC | PRN

## 2015-11-10 MED ORDER — POTASSIUM CHLORIDE CRYS ER 20 MEQ PO TBCR
20.0000 meq | EXTENDED_RELEASE_TABLET | Freq: Every day | ORAL | Status: DC
  Administered 2015-11-10 – 2015-11-12 (×3): 20 meq via ORAL
  Filled 2015-11-10 (×3): qty 1

## 2015-11-10 MED ORDER — INSULIN ASPART 100 UNIT/ML ~~LOC~~ SOLN
0.0000 [IU] | Freq: Every day | SUBCUTANEOUS | Status: DC
  Administered 2015-11-13: 2 [IU] via SUBCUTANEOUS
  Filled 2015-11-10: qty 2

## 2015-11-10 MED ORDER — DULOXETINE HCL 60 MG PO CPEP
60.0000 mg | ORAL_CAPSULE | Freq: Every day | ORAL | Status: DC
  Administered 2015-11-10 – 2015-11-15 (×6): 60 mg via ORAL
  Filled 2015-11-10 (×6): qty 1

## 2015-11-10 MED ORDER — FINASTERIDE 5 MG PO TABS
5.0000 mg | ORAL_TABLET | Freq: Every day | ORAL | Status: DC
  Administered 2015-11-10 – 2015-11-15 (×6): 5 mg via ORAL
  Filled 2015-11-10 (×6): qty 1

## 2015-11-10 MED ORDER — DOCUSATE SODIUM 100 MG PO CAPS: 100.0000 mg | ORAL_CAPSULE | Freq: Every day | ORAL | Status: DC | PRN

## 2015-11-10 MED ORDER — LEVOFLOXACIN IN D5W 750 MG/150ML IV SOLN
750.0000 mg | INTRAVENOUS | Status: DC
  Filled 2015-11-10: qty 150

## 2015-11-10 MED ORDER — INFLUENZA VAC SPLIT QUAD 0.5 ML IM SUSY
0.5000 mL | PREFILLED_SYRINGE | INTRAMUSCULAR | Status: AC
  Administered 2015-11-11: 0.5 mL via INTRAMUSCULAR
  Filled 2015-11-10: qty 0.5

## 2015-11-10 MED ORDER — BISACODYL 5 MG PO TBEC
5.0000 mg | DELAYED_RELEASE_TABLET | Freq: Every day | ORAL | Status: DC | PRN
  Administered 2015-11-15: 5 mg via ORAL
  Filled 2015-11-10: qty 1

## 2015-11-10 MED ORDER — ONDANSETRON HCL 4 MG/2ML IJ SOLN: 4.0000 mg | Freq: Four times a day (QID) | INTRAMUSCULAR | Status: DC | PRN

## 2015-11-10 MED ORDER — SODIUM CHLORIDE 0.9% FLUSH
3.0000 mL | Freq: Two times a day (BID) | INTRAVENOUS | Status: DC
  Administered 2015-11-10 – 2015-11-14 (×9): 3 mL via INTRAVENOUS

## 2015-11-10 MED ORDER — RIVAROXABAN 10 MG PO TABS
20.0000 mg | ORAL_TABLET | Freq: Every day | ORAL | Status: DC
  Administered 2015-11-10 – 2015-11-14 (×5): 20 mg via ORAL
  Filled 2015-11-10 (×6): qty 2

## 2015-11-10 MED ORDER — ENSURE ENLIVE PO LIQD
237.0000 mL | Freq: Two times a day (BID) | ORAL | Status: DC
  Administered 2015-11-10 – 2015-11-15 (×9): 237 mL via ORAL

## 2015-11-10 MED ORDER — FUROSEMIDE 40 MG PO TABS
40.0000 mg | ORAL_TABLET | Freq: Two times a day (BID) | ORAL | Status: DC
  Administered 2015-11-10 – 2015-11-15 (×12): 40 mg via ORAL
  Filled 2015-11-10 (×12): qty 1

## 2015-11-10 MED ORDER — ONDANSETRON HCL 4 MG PO TABS: 4.0000 mg | ORAL_TABLET | Freq: Four times a day (QID) | ORAL | Status: DC | PRN

## 2015-11-10 MED ORDER — METOPROLOL TARTRATE 25 MG PO TABS
25.0000 mg | ORAL_TABLET | Freq: Two times a day (BID) | ORAL | Status: DC
  Administered 2015-11-10 – 2015-11-15 (×11): 25 mg via ORAL
  Filled 2015-11-10 (×13): qty 1

## 2015-11-10 MED ORDER — ALBUTEROL SULFATE (2.5 MG/3ML) 0.083% IN NEBU: 3.0000 mL | INHALATION_SOLUTION | RESPIRATORY_TRACT | Status: DC | PRN

## 2015-11-10 MED ORDER — DONEPEZIL HCL 5 MG PO TABS
10.0000 mg | ORAL_TABLET | Freq: Every day | ORAL | Status: DC
  Administered 2015-11-10 – 2015-11-14 (×6): 10 mg via ORAL
  Filled 2015-11-10 (×7): qty 2

## 2015-11-10 MED ORDER — LEVOFLOXACIN IN D5W 750 MG/150ML IV SOLN
750.0000 mg | INTRAVENOUS | Status: DC
  Administered 2015-11-11: 750 mg via INTRAVENOUS
  Filled 2015-11-10: qty 150

## 2015-11-10 MED ORDER — ZOLPIDEM TARTRATE 5 MG PO TABS: 5.0000 mg | ORAL_TABLET | Freq: Every evening | ORAL | Status: DC | PRN

## 2015-11-10 MED ORDER — BENZONATATE 100 MG PO CAPS: 200.0000 mg | ORAL_CAPSULE | Freq: Three times a day (TID) | ORAL | Status: DC | PRN

## 2015-11-10 MED ORDER — ACETAMINOPHEN 650 MG RE SUPP: 650.0000 mg | Freq: Four times a day (QID) | RECTAL | Status: DC | PRN

## 2015-11-10 MED ORDER — SODIUM CHLORIDE 0.9 % IV SOLN
INTRAVENOUS | Status: DC
  Administered 2015-11-10: 18:00:00 via INTRAVENOUS
  Administered 2015-11-10: 50 mL/h via INTRAVENOUS
  Administered 2015-11-11: 11:00:00 via INTRAVENOUS

## 2015-11-10 MED ORDER — INSULIN ASPART 100 UNIT/ML ~~LOC~~ SOLN
0.0000 [IU] | Freq: Three times a day (TID) | SUBCUTANEOUS | Status: DC
  Administered 2015-11-10 (×2): 2 [IU] via SUBCUTANEOUS
  Administered 2015-11-10: 11 [IU] via SUBCUTANEOUS
  Administered 2015-11-11 (×2): 2 [IU] via SUBCUTANEOUS
  Administered 2015-11-11: 5 [IU] via SUBCUTANEOUS
  Administered 2015-11-12: 3 [IU] via SUBCUTANEOUS
  Administered 2015-11-12: 2 [IU] via SUBCUTANEOUS
  Administered 2015-11-12 – 2015-11-13 (×2): 3 [IU] via SUBCUTANEOUS
  Administered 2015-11-13: 11 [IU] via SUBCUTANEOUS
  Administered 2015-11-14: 5 [IU] via SUBCUTANEOUS
  Administered 2015-11-14: 8 [IU] via SUBCUTANEOUS
  Administered 2015-11-15: 2 [IU] via SUBCUTANEOUS
  Administered 2015-11-15: 8 [IU] via SUBCUTANEOUS
  Filled 2015-11-10: qty 2
  Filled 2015-11-10: qty 3
  Filled 2015-11-10: qty 8
  Filled 2015-11-10 (×2): qty 2
  Filled 2015-11-10: qty 5
  Filled 2015-11-10: qty 2
  Filled 2015-11-10: qty 3
  Filled 2015-11-10 (×3): qty 2
  Filled 2015-11-10: qty 11
  Filled 2015-11-10: qty 5
  Filled 2015-11-10: qty 11
  Filled 2015-11-10: qty 8

## 2015-11-10 MED ORDER — AMLODIPINE BESYLATE 5 MG PO TABS
5.0000 mg | ORAL_TABLET | Freq: Every day | ORAL | Status: DC
  Administered 2015-11-10 – 2015-11-12 (×3): 5 mg via ORAL
  Filled 2015-11-10 (×4): qty 1

## 2015-11-10 MED ORDER — TIMOLOL MALEATE 0.5 % OP SOLN
1.0000 [drp] | Freq: Two times a day (BID) | OPHTHALMIC | Status: DC
  Administered 2015-11-10 – 2015-11-15 (×10): 1 [drp] via OPHTHALMIC
  Filled 2015-11-10 (×2): qty 5

## 2015-11-10 MED ORDER — MEMANTINE HCL ER 14 MG PO CP24
28.0000 mg | ORAL_CAPSULE | Freq: Every day | ORAL | Status: DC
  Administered 2015-11-10 – 2015-11-15 (×6): 28 mg via ORAL
  Filled 2015-11-10 (×6): qty 2

## 2015-11-10 MED ORDER — INSULIN DETEMIR 100 UNIT/ML ~~LOC~~ SOLN
25.0000 [IU] | Freq: Two times a day (BID) | SUBCUTANEOUS | Status: DC
  Administered 2015-11-10 – 2015-11-13 (×7): 25 [IU] via SUBCUTANEOUS
  Filled 2015-11-10 (×9): qty 0.25

## 2015-11-10 MED ORDER — ATORVASTATIN CALCIUM 20 MG PO TABS
20.0000 mg | ORAL_TABLET | Freq: Every day | ORAL | Status: DC
  Administered 2015-11-10 – 2015-11-15 (×6): 20 mg via ORAL
  Filled 2015-11-10 (×6): qty 1

## 2015-11-10 MED ORDER — PREGABALIN 75 MG PO CAPS: 75.0000 mg | ORAL_CAPSULE | Freq: Every day | ORAL | Status: DC | PRN

## 2015-11-10 MED ORDER — HYDROCODONE-ACETAMINOPHEN 5-325 MG PO TABS: 1.0000 | ORAL_TABLET | ORAL | Status: DC | PRN

## 2015-11-10 MED ORDER — MAGNESIUM CITRATE PO SOLN: 1.0000 | Freq: Once | ORAL | Status: DC | PRN

## 2015-11-10 MED ORDER — PROSTATE SR 160-250 MG PO CAPS: 1.0000 | ORAL_CAPSULE | Freq: Every day | ORAL | Status: DC

## 2015-11-10 NOTE — Progress Notes (Signed)
Pharmacy Antibiotic Note  William Byrd is a 82 y.o. male admitted on 11/09/2015 with sepsis.  Pharmacy has been consulted for vancomycin and pip/tazo dosing.  Plan: Levofloxacin dose changed to 750 mg IV Q48H for HCAP.  Height: 5\' 10"  (177.8 cm) Weight: 176 lb 3.2 oz (79.9 kg) IBW/kg (Calculated) : 73  Temp (24hrs), Avg:100.5 F (38.1 C), Min:98.1 F (36.7 C), Max:102.9 F (39.4 C)   Recent Labs Lab 11/09/15 1549 11/09/15 1841  WBC 13.7*  --   CREATININE 2.05*  --   LATICACIDVEN 2.3* 1.5    Estimated Creatinine Clearance: 28.7 mL/min (by C-G formula based on SCr of 2.05 mg/dL (H)).    Allergies  Allergen Reactions  . Fosinopril Other (See Comments)    Reaction: Hyperkalemia     Antimicrobials this admission: Vanc 9/29 >> once Pip/tazo 9-29 >> once Levofloxacin 9/29 >>  Microbiology results: 9/29 BCx: sent 9/29 UCx: sent   Thank you for allowing pharmacy to be a part of this patient's care.  Horris LatinoHolly Gilliam, PharmD Pharmacy Resident 11/10/2015 1:16 AM

## 2015-11-10 NOTE — Progress Notes (Signed)
New Admission Note:   Arrival Method: per stretcher from ED, pt came from home Mental Orientation: alert and oriented to self, confused at baseline Telemetry: placed on box 4031, CCMD notified, verified with Rosey Batheresa, RN Assessment: Completed Skin: warm, dry, intact with healed and unstageable old wound on the left heel measuring 1cm x 1cm. IV: G22 on the left wrist, G20 on the right hand. Both with transparent dressing and intact. Pain: denies any pain as of this time Safety Measures: Safety Fall Prevention Plan has been given but pt needs reinforcement; pt oriented to self only at this time Admission: Completed, information taken from wife thru phone Family: no family member present at bedside  Orders have been reviewed and implemented. Will continue to monitor the patient. Call light has been placed within reach and bed alarm has been activated.   Janice NorrieAnessa Annamary Buschman BSN, RN [facility]

## 2015-11-10 NOTE — Progress Notes (Signed)
Patient is alert to self only. Up with hoyer lift to chair, tolerated for a few hours this shift. Tele discontinued. IV fluids at 50 cc/hr. Incont of urine and had a small BM this shift. Bed/chair alarm for safety. Blood sugars 121, 138, and 308, received SS insulin as ordered.

## 2015-11-10 NOTE — Progress Notes (Signed)
Pt has history of CHF, Dr. Dahlia BailiffHugelmyer ordered to decrease IV infusion rate from 100 to 250ml/hr.

## 2015-11-10 NOTE — Evaluation (Signed)
Physical Therapy Evaluation Patient Details Name: William Byrd MRN: [id number] DOB: 12/14/1931 Today's Date: 11/10/2015   History of Present Illness  82 yo male with onset of PNA and sepsis was admitted from SNF per chart, pt unable to give an accurate history.  PMHx:  tremors, dementia, DVT, CKD, OA, HTN, glaucoma, and peripheral neuropathy.   Clinical Impression  Pt was seen for evaluation of his mobility with dense LE weakness and difficulty with pt performing motor planning. He is already per chart in SNF and will recommend he continue on at that level, with notation that wife had previously asked for help with transfers to assist him better when he gets home.  Continue on acutely to strengthen and promote better ability to assist bed mob and transfers with sequencing and safety.     Follow Up Recommendations SNF    Equipment Recommendations  None recommended by PT    Recommendations for Other Services Rehab consult     Precautions / Restrictions Precautions Precautions: Fall Precaution Comments: not clear from pt if he was ambulatory at baseline Restrictions Weight Bearing Restrictions: No      Mobility  Bed Mobility Overal bed mobility: Needs Assistance Bed Mobility: Supine to Sit     Supine to sit: Mod assist;Max assist;HOB elevated     General bed mobility comments: cued hand placement and direction to move, used bed pad to assist slide  Transfers Overall transfer level: Needs assistance Equipment used: 1 person hand held assist;Rolling walker (2 wheeled) (bed pads) Transfers: Sit to/from Stand;Lateral/Scoot Transfers Sit to Stand: Total assist        Lateral/Scoot Transfers: Max assist;From elevated surface General transfer comment: attempted standing with walker and with PT trying to lift but neither one was effective so used bed pads to slide to drop arm chair.  Ambulation/Gait             General Gait Details: unable today  Stairs            Wheelchair Mobility    Modified Rankin (Stroke Patients Only)       Balance Overall balance assessment: Needs assistance Sitting-balance support: Feet supported Sitting balance-Leahy Scale: Poor Sitting balance - Comments:  (L lateral lean then corrected manually and propped in chair) Postural control: Left lateral lean                                   Pertinent Vitals/Pain Pain Assessment: Faces Faces Pain Scale: Hurts a little bit Pain Location: back and legs Pain Intervention(s): Limited activity within patient's tolerance;Repositioned;Monitored during session    Home Living Family/patient expects to be discharged to:: Skilled nursing facility                      Prior Function Level of Independence: Needs assistance   Gait / Transfers Assistance Needed: pt was getting lifted out of bed on mechanical device at home prior to his admission to SNF  ADL's / Homemaking Assistance Needed: in SNF for all care, previously wife cared for him at home        Hand Dominance   Dominant Hand: Right    Extremity/Trunk Assessment   Upper Extremity Assessment: Generalized weakness           Lower Extremity Assessment: Generalized weakness      Cervical / Trunk Assessment: Kyphotic  Communication   Communication: Expressive difficulties  Cognition Arousal/Alertness:  Awake/alert Behavior During Therapy: Flat affect Overall Cognitive Status: History of cognitive impairments - at baseline       Memory: Decreased recall of precautions;Decreased short-term memory              General Comments      Exercises     Assessment/Plan    PT Assessment Patient needs continued PT services  PT Problem List Decreased strength;Decreased activity tolerance;Decreased range of motion;Decreased balance;Decreased mobility;Decreased coordination;Decreased cognition;Decreased knowledge of use of DME;Decreased safety awareness;Decreased knowledge of  precautions          PT Treatment Interventions DME instruction;Functional mobility training;Therapeutic activities;Therapeutic exercise;Balance training;Neuromuscular re-education;Patient/family education    PT Goals (Current goals can be found in the Care Plan section)  Acute Rehab PT Goals Patient Stated Goal: none stated PT Goal Formulation: Patient unable to participate in goal setting Time For Goal Achievement: 11/24/15 Potential to Achieve Goals: Fair    Frequency Min 2X/week   Barriers to discharge   lives in SNF currently where more therapy can be added    Co-evaluation               End of Session Equipment Utilized During Treatment: Gait belt Activity Tolerance: Patient tolerated treatment well;Other (comment) (limited by cognition and weakness, contractures) Patient left: in chair;with call bell/phone within reach;with chair alarm set Nurse Communication: Mobility status;Need for lift equipment;Other (comment) (technique used to slide into chair)         Time: 9629-5284 PT Time Calculation (min) (ACUTE ONLY): 25 min   Charges:   PT Evaluation $PT Eval Moderate Complexity: 1 Procedure PT Treatments $Therapeutic Activity: 8-22 mins   PT G Codes:        Ivar Drape 11/18/15, 1:43 PM Samul Dada, PT MS Acute Rehab Dept. Number: [facility] [id number] and [facility] [place] Hights [phone]

## 2015-11-10 NOTE — H&P (Signed)
[facility] - [place] @ [facility] Admission History and Physical Tonye Royalty, D.O.  ---------------------------------------------------------------------------------------------------------------------   PATIENT NAME: William Byrd MR#: [id number] DATE OF BIRTH: Mar 14, 1931 DATE OF ADMISSION: 11/09/2015 PRIMARY CARE PHYSICIAN: Marisue Ivan, MD  REQUESTING/REFERRING PHYSICIAN: ED Dr. Sharma Covert  CHIEF COMPLAINT: Chief Complaint  Patient presents with  . Code Sepsis    HISTORY OF PRESENT ILLNESS:Please note the majority of the history is obtained from the emergency department physician and records secondary to patient's dementia. Jomari Bartnik is a 82 y.o. male with a known history of CHF, hypertension, hyperlipidemia, CK D stage III, DVT, dementia was in a usual state of health until this afternoon. He essentially emergency department from his nursing home secondary to vomiting. Patient was admitted here on 10/17/2015 with diagnosis of acute hypoxic respiratory failure secondary to pneumonia, community-acquired  On my questioning patient offers no complaints at this time.    EMS/ED COURSE:   Patient received Zosyn, Vanco and Levaquin as well as IV fluids.  PAST MEDICAL HISTORY: Past Medical History:  Diagnosis Date  . Arthritis   . Background retinopathy   . Cervicalgia   . Chronic kidney disease   . Chronic systolic CHF (congestive heart failure) (HCC)   . Degeneration of lumbar or lumbosacral intervertebral disc   . Dementia   . Diabetes mellitus without complication (HCC)   . DVT (deep venous thrombosis) (HCC)   . Generalized weakness   . Glaucoma   . Hypercholesteremia   . Hypertension   . Long-term insulin use (HCC)   . Peripheral polyneuropathy (HCC)   . Peripheral vascular disease (HCC)   . Spinal stenosis       PAST SURGICAL HISTORY: Past Surgical History:  Procedure Laterality Date  . c-spine fusion N/A       SOCIAL HISTORY: Social History   Substance Use Topics  . Smoking status: Former Smoker    Packs/day: 0.50    Years: 30.00  . Smokeless tobacco: Never Used  . Alcohol use No      FAMILY HISTORY: Family History  Problem Relation Age of Onset  . Diabetes Mother   . Liver cancer Father   . Liver disease Father   . Arthritis Sister      MEDICATIONS AT HOME: Prior to Admission medications   Medication Sig Start Date End Date Taking? Authorizing Provider  acetaminophen (TYLENOL) 325 MG tablet Take 650 mg by mouth every 6 (six) hours as needed for mild pain, moderate pain, fever or headache.   Yes Historical Provider, MD  amLODipine (NORVASC) 5 MG tablet Take 5 mg by mouth daily.    Yes Historical Provider, MD  atorvastatin (LIPITOR) 20 MG tablet Take 20 mg by mouth daily.   Yes Historical Provider, MD  cholecalciferol (VITAMIN D) 1000 UNITS tablet Take 1,000 Units by mouth daily.   Yes Historical Provider, MD  docusate sodium (STOOL SOFTENER) 100 MG capsule Take 100 mg by mouth daily as needed for mild constipation or moderate constipation.    Yes Historical Provider, MD  donepezil (ARICEPT) 10 MG tablet Take 10 mg by mouth at bedtime. 07/05/14  Yes Historical Provider, MD  DULoxetine (CYMBALTA) 60 MG capsule Take 60 mg by mouth daily. 06/01/14  Yes Historical Provider, MD  feeding supplement, ENSURE ENLIVE, (ENSURE ENLIVE) LIQD Take 237 mLs by mouth 2 (two) times daily between meals. 07/18/15  Yes Auburn Bilberry, MD  finasteride (PROSCAR) 5 MG tablet Take 5 mg by mouth daily. 07/19/14  Yes Historical Provider, MD  furosemide (LASIX) 20 MG tablet Take 40 mg by mouth daily. Take 2 tablets in the morning. Take an additional tablet 6 hours later.   Yes Historical Provider, MD  insulin NPH Human (HUMULIN N,NOVOLIN N) 100 UNIT/ML injection Inject 25 Units into the skin 2 (two) times daily before a meal. 25 units in the morning and 30 units at night   Yes Historical Provider, MD  linagliptin (TRADJENTA) 5 MG TABS tablet Take 5 mg by  mouth daily.   Yes Historical Provider, MD  memantine (NAMENDA XR) 28 MG CP24 24 hr capsule Take 28 mg by mouth daily.   Yes Historical Provider, MD  metoprolol tartrate (LOPRESSOR) 25 MG tablet Take 1 tablet (25 mg total) by mouth 2 (two) times daily. 09/08/14  Yes Richard Renae GlossWieting, MD  potassium chloride SA (K-DUR,KLOR-CON) 20 MEQ tablet Take 20 mEq by mouth daily.   Yes Historical Provider, MD  pregabalin (LYRICA) 75 MG capsule Take 1 capsule (75 mg total) by mouth 2 (two) times daily. Patient taking differently: Take 75 mg by mouth daily as needed.  12/31/14  Yes Houston SirenVivek J Sainani, MD  rivaroxaban (XARELTO) 20 MG TABS tablet Take 1 tablet by mouth daily. 07/18/14  Yes Historical Provider, MD  Saw Palmetto-Phytosterols (PROSTATE SR) 160-250 MG CAPS Take 1 capsule by mouth daily.   Yes Historical Provider, MD  timolol (TIMOPTIC) 0.5 % ophthalmic solution Place 1 drop into both eyes 2 (two) times daily.   Yes Historical Provider, MD  albuterol (PROVENTIL HFA;VENTOLIN HFA) 108 (90 Base) MCG/ACT inhaler Inhale 2 puffs into the lungs every 4 (four) hours as needed for wheezing or shortness of breath.    Historical Provider, MD  benzonatate (TESSALON) 200 MG capsule Take 200 mg by mouth 3 (three) times daily as needed for cough.    Historical Provider, MD  levofloxacin (LEVAQUIN) 250 MG tablet Take 1 tablet (250 mg total) by mouth daily. Patient not taking: Reported on 11/09/2015 10/19/15   Enedina FinnerSona Patel, MD      DRUG ALLERGIES: Allergies  Allergen Reactions  . Fosinopril Other (See Comments)    Reaction: Hyperkalemia      REVIEW OF SYSTEMS: Unable to obtain secondary to dementia  PHYSICAL EXAMINATION: VITAL SIGNS: Blood pressure 135/68, pulse 85, temperature 98.1 F (36.7 C), temperature source Oral, resp. rate 18, height 5\' 10"  (1.778 m), weight 79.9 kg (176 lb 3.2 oz), SpO2 94 %.  GENERAL: 82 y.o.-year-old  black male patient, well-developed, well-nourished lying in the bed in no acute distress.   Pleasant and cooperative.   HEENT: Head atraumatic, normocephalic. Pupils equal, round, reactive to light and accommodation. No scleral icterus. Extraocular muscles intact. Nares are patent. Oropharynx is clear. Mucus membranes dry NECK: Supple, full range of motion. No JVD, no bruit heard. No thyroid enlargement, no tenderness, no cervical lymphadenopathy. CHEST: Normal breath sounds bilaterally. No wheezing, rales, rhonchi or crackles. No use of accessory muscles of respiration.  No reproducible chest wall tenderness.  CARDIOVASCULAR: S1, S2 normal. No murmurs, rubs, or gallops. Cap refill <2 seconds. ABDOMEN: Soft, nontender, nondistended. No rebound, guarding, rigidity. Normoactive bowel sounds present in all four quadrants. No organomegaly or mass. EXTREMITIES: Full range of motion. No pedal edema, cyanosis, or clubbing. NEUROLOGIC: Patient is a and O 1. Follows commands. Cranial nerves II through XII are grossly intact with no focal sensorimotor deficit. Muscle strength 5/5 in all extremities. Sensation intact. Gait not checked. SKIN: Warm, dry, and intact without obvious rash, lesion, or ulcer.  LABORATORY PANEL:  CBC  Recent Labs Lab 11/09/15 1549  WBC 13.7*  HGB 11.7*  HCT 34.3*  PLT 275   ----------------------------------------------------------------------------------------------------------------- Chemistries  Recent Labs Lab 11/09/15 1549  NA 139  K 4.5  CL 103  CO2 28  GLUCOSE 104*  BUN 24*  CREATININE 2.05*  CALCIUM 9.3  AST 25  ALT 14*  ALKPHOS 86  BILITOT 0.7   ------------------------------------------------------------------------------------------------------------------ Cardiac Enzymes  Recent Labs Lab 11/09/15 1549  TROPONINI <0.03   ------------------------------------------------------------------------------------------------------------------  RADIOLOGY: Ct Abdomen Pelvis Wo Contrast  Result Date: 11/09/2015 CLINICAL DATA:  82 year old  male with history of vomiting today. Generalized malaise. Abdominal distention. EXAM: CT ABDOMEN AND PELVIS WITHOUT CONTRAST TECHNIQUE: Multidetector CT imaging of the abdomen and pelvis was performed following the standard protocol without IV contrast. COMPARISON:  No priors. FINDINGS: Lower chest: Atherosclerotic calcifications in the left circumflex and right coronary arteries. Extensive peribronchovascular airspace disease in the left lower lobe, concerning for sequela of recent aspiration. Hepatobiliary: The numerous calcified granulomas are noted throughout the liver. No discrete cystic or solid hepatic lesions are noted. Unenhanced appearance of the gallbladder is normal. Pancreas: No pancreatic mass or peripancreatic inflammatory changes on today's noncontrast CT examination. Spleen: Innumerable calcified granulomas are noted throughout the spleen. Adrenals/Urinary Tract: Nonobstructive calculi are noted within the lower pole collecting systems of the kidneys bilaterally, measuring up to 4 mm on the left side. No calculi are noted along the course of either ureter or within the lumen of the urinary bladder. There is no hydroureteronephrosis. Unenhanced appearance of the kidneys is otherwise unremarkable. Unenhanced appearance of the urinary bladder is normal. Bilateral adrenal glands are normal in appearance. Stomach/Bowel: Unenhanced appearance of the stomach is normal. There is no pathologic dilatation of small bowel or colon. The appendix is not confidently identified and may be surgically absent. Regardless, there are no inflammatory changes noted adjacent to the cecum to suggest the presence of an acute appendicitis at this time. Large volume of stool in the rectal vault. Vascular/Lymphatic: Aortic atherosclerosis, without evidence of aneurysm in the abdominal or pelvic vasculature. No lymphadenopathy noted in the abdomen or pelvis. Reproductive: Prostate gland and seminal vesicles are unremarkable in  appearance. Other: No significant volume of ascites.  No pneumoperitoneum. Musculoskeletal: There are no aggressive appearing lytic or blastic lesions noted in the visualized portions of the skeleton. IMPRESSION: 1. Findings in the left lower lobe, concerning for either sequela of recent aspiration or developing bronchopneumonia. 2. No acute findings in the abdomen or pelvis. 3. Nonobstructive calculi are noted within the collecting systems of the kidneys bilaterally measuring up to 4 mm in the lower pole collecting system of left kidney. However, there are no ureteral stones or findings to suggest urinary tract obstruction at this time. 4. Aortic atherosclerosis, in addition to at least 2 vessel coronary artery disease. 5. Large volume of stool in the rectal vault may suggest constipation. 6. Sequela of old granulomatous disease, as above. Electronically Signed   By: Trudie Reed M.D.   On: 11/09/2015 19:44   Dg Chest Port 1 View  Result Date: 11/09/2015 CLINICAL DATA:  New onset fever today.  History of pneumonia. EXAM: PORTABLE CHEST 1 VIEW COMPARISON:  PA and lateral chest 10/17/2015 and 07/15/2015. CT chest 12/28/2014. FINDINGS: Heart size is upper normal. Lungs are clear. No pneumothorax or pleural effusion. Splenic calcifications noted and unchanged. IMPRESSION: No acute disease. Electronically Signed   By: Drusilla Kanner M.D.   On: 11/09/2015 16:14    EKG:  Sinus  tachycardia at 117 bpm with normal axis and nonspecific ST and T wave changes.  IMPRESSION AND PLAN:  This is a 82 y.o. male with a history of CHF, hypertension, hyperlipidemia, CK D stage III, DVT, dementia now being admitted with: 1.  Sepsis secondary to 8 Healthcare associated pneumonia. Admit to inpatient for monitoring, IV antibiotics, gentle IV fluids given history of CHF, blood urine and sputum cultures. O2 and nebulizer therapy as needed. 2.  AK I, mild-we will gently hydrate. 3.  Anemia, chronic and stable. 4. History  of hypertension-continue Norvasc and Lopressor 5. History of hyperlipidemia-Lipitor 6. History of dementia continue Namenda and Aricept 7. History of CHF-continue Lasix and potassium 8. History of diabetes we'll continue insulin with Accu-Cheks before meals and at bedtime and regular insulin sliding coverage 9. History of peripheral neuropathy-continue Lyrica 10. History of DVT continue Xarelto    Diet/Nutrition:  Heart healthy, carb controlled Fluids:  Gentle IV normal saline DVT Px:  Xarelto,  SCDs and early ambulation Code Status: Full  All the records are reviewed and case discussed with ED provider. Management plans discussed with the patient and/or family who express understanding and agree with plan of care.   TOTAL TIME TAKING CARE OF THIS PATIENT: 60 minutes.   Abyan Cadman D.O. on 11/10/2015 at 1:54 AM Between 7am to 6pm - Pager - [phone] After 6pm go to [email or web address] - Social research officer, government [facility] [place] [facility] Office [phone] CC: Primary care physician; Marisue Ivan, MD     Note: This dictation was prepared with Dragon dictation along with smaller phrase technology. Any transcriptional errors that result from this process are unintentional.

## 2015-11-10 NOTE — Progress Notes (Signed)
Pharmacist - Prescriber Communication  Prostate SR 160-250 mg capsule (saw palmetto-phytosterols) discontinued per [facility] herbal policy.  Brolin Dambrosia A. Giffordookson, VermontPharm.D., BCPS Clinical Pharmacist 11/10/2015 [phone]

## 2015-11-10 NOTE — Progress Notes (Signed)
[facility] [place] at [facility]   PATIENT NAME: William DienesJames Byrd    MRN#:  [id number]  DATE OF BIRTH:  12/04/1931  SUBJECTIVE:  Hospital Day: 1 day William Byrd is a 82 y.o. male presenting with . Nausea vomiting shortness of breath  Overnight events: No acute overnight events Interval Events: No complaints at this time, currently on room air  REVIEW OF SYSTEMS:  Unable to accurately obtain given dementia  DRUG ALLERGIES:   Allergies  Allergen Reactions  . Fosinopril Other (See Comments)    Reaction: Hyperkalemia     VITALS:  Blood pressure (!) 134/59, pulse 61, temperature 98.3 F (36.8 C), temperature source Oral, resp. rate 18, height 5\' 10"  (1.778 m), weight 79.9 kg (176 lb 3.2 oz), SpO2 97 %.  PHYSICAL EXAMINATION:  VITAL SIGNS: Vitals:   11/10/15 0733 11/10/15 1122  BP: (!) 137/58 (!) 134/59  Pulse: 68 61  Resp: 16 18  Temp: 98.3 F (36.8 C) 98.3 F (36.8 C)   GENERAL:82 y.o.male currently in no acute distress.  HEAD: Normocephalic, atraumatic.  EYES: Pupils equal, round, reactive to light. Extraocular muscles intact. No scleral icterus.  MOUTH: Moist mucosal membrane. Dentition intact. No abscess noted.  EAR, NOSE, THROAT: Clear without exudates. No external lesions.  NECK: Supple. No thyromegaly. No nodules. No JVD.  PULMONARY: Diminished breath sounds with scattered rhonchi on the left without wheeze . No use of accessory muscles, Good respiratory effort. good air entry bilaterally CHEST: Nontender to palpation.  CARDIOVASCULAR: S1 and S2. Regular rate and rhythm. No murmurs, rubs, or gallops. No edema. Pedal pulses 2+ bilaterally.  GASTROINTESTINAL: Soft, nontender, nondistended. No masses. Positive bowel sounds. No hepatosplenomegaly.  MUSCULOSKELETAL: No swelling, clubbing, or edema. Range of motion full in all extremities.  NEUROLOGIC: Cranial nerves II through XII are intact. No gross focal neurological deficits. Sensation  intact. Reflexes intact.  SKIN: No ulceration, lesions, rashes, or cyanosis. Skin warm and dry. Turgor intact.  PSYCHIATRIC: Mood, affect within normal limits. The patient is awake, alert and oriented x 3. Insight, judgment intact.      LABORATORY PANEL:   CBC  Recent Labs Lab 11/10/15 0326  WBC 10.5  HGB 9.6*  HCT 28.3*  PLT 246   ------------------------------------------------------------------------------------------------------------------  Chemistries   Recent Labs Lab 11/10/15 0326  NA 139  K 3.7  CL 106  CO2 27  GLUCOSE 140*  BUN 24*  CREATININE 1.91*  CALCIUM 8.5*  AST 18  ALT 12*  ALKPHOS 67  BILITOT 0.7   ------------------------------------------------------------------------------------------------------------------  Cardiac Enzymes  Recent Labs Lab 11/09/15 1549  TROPONINI <0.03   ------------------------------------------------------------------------------------------------------------------  RADIOLOGY:  Ct Abdomen Pelvis Wo Contrast  Result Date: 11/09/2015 CLINICAL DATA:  82 year old male with history of vomiting today. Generalized malaise. Abdominal distention. EXAM: CT ABDOMEN AND PELVIS WITHOUT CONTRAST TECHNIQUE: Multidetector CT imaging of the abdomen and pelvis was performed following the standard protocol without IV contrast. COMPARISON:  No priors. FINDINGS: Lower chest: Atherosclerotic calcifications in the left circumflex and right coronary arteries. Extensive peribronchovascular airspace disease in the left lower lobe, concerning for sequela of recent aspiration. Hepatobiliary: The numerous calcified granulomas are noted throughout the liver. No discrete cystic or solid hepatic lesions are noted. Unenhanced appearance of the gallbladder is normal. Pancreas: No pancreatic mass or peripancreatic inflammatory changes on today's noncontrast CT examination. Spleen: Innumerable calcified granulomas are noted throughout the spleen.  Adrenals/Urinary Tract: Nonobstructive calculi are noted within the lower pole collecting systems of the kidneys bilaterally, measuring  up to 4 mm on the left side. No calculi are noted along the course of either ureter or within the lumen of the urinary bladder. There is no hydroureteronephrosis. Unenhanced appearance of the kidneys is otherwise unremarkable. Unenhanced appearance of the urinary bladder is normal. Bilateral adrenal glands are normal in appearance. Stomach/Bowel: Unenhanced appearance of the stomach is normal. There is no pathologic dilatation of small bowel or colon. The appendix is not confidently identified and may be surgically absent. Regardless, there are no inflammatory changes noted adjacent to the cecum to suggest the presence of an acute appendicitis at this time. Large volume of stool in the rectal vault. Vascular/Lymphatic: Aortic atherosclerosis, without evidence of aneurysm in the abdominal or pelvic vasculature. No lymphadenopathy noted in the abdomen or pelvis. Reproductive: Prostate gland and seminal vesicles are unremarkable in appearance. Other: No significant volume of ascites.  No pneumoperitoneum. Musculoskeletal: There are no aggressive appearing lytic or blastic lesions noted in the visualized portions of the skeleton. IMPRESSION: 1. Findings in the left lower lobe, concerning for either sequela of recent aspiration or developing bronchopneumonia. 2. No acute findings in the abdomen or pelvis. 3. Nonobstructive calculi are noted within the collecting systems of the kidneys bilaterally measuring up to 4 mm in the lower pole collecting system of left kidney. However, there are no ureteral stones or findings to suggest urinary tract obstruction at this time. 4. Aortic atherosclerosis, in addition to at least 2 vessel coronary artery disease. 5. Large volume of stool in the rectal vault may suggest constipation. 6. Sequela of old granulomatous disease, as above. Electronically  Signed   By: Trudie Reed M.D.   On: 11/09/2015 19:44   Dg Chest Port 1 View  Result Date: 11/09/2015 CLINICAL DATA:  New onset fever today.  History of pneumonia. EXAM: PORTABLE CHEST 1 VIEW COMPARISON:  PA and lateral chest 10/17/2015 and 07/15/2015. CT chest 12/28/2014. FINDINGS: Heart size is upper normal. Lungs are clear. No pneumothorax or pleural effusion. Splenic calcifications noted and unchanged. IMPRESSION: No acute disease. Electronically Signed   By: Drusilla Kanner M.D.   On: 11/09/2015 16:14    EKG:   Orders placed or performed during the hospital encounter of 11/09/15  . ED EKG 12-Lead  . ED EKG 12-Lead  . EKG 12-Lead  . EKG 12-Lead  . EKG 12-Lead  . EKG 12-Lead    ASSESSMENT AND PLAN:   Colsen Modi is a 82 y.o. male presenting with Code Sepsis . Admitted 11/09/2015 : Day #: 1 day 1. Sepsis, present on admission: Secondary to pneumonia, continue on current antibiotic regimen and follow culture data downgraded antibiotics tomorrow continue supplemental oxygen, breathing treatments as required 2. Acute kidney injury: Resolved 3. Essential hypertension Norvasc Lopressor 4. Dementia: Namenda Aricept 5. Type 2 diabetes insulin requiring continue basal insulin and sliding coverage  Disposition: Physical therapy evaluation  All the records are reviewed and case discussed with Care Management/Social Workerr. Management plans discussed with the patient, family and they are in agreement.  CODE STATUS: full TOTAL TIME TAKING CARE OF THIS PATIENT: 28 minutes.   POSSIBLE D/C IN 1-2DAYS, DEPENDING ON CLINICAL CONDITION.   Zhanae Proffit,  Mardi Mainland.D on 11/10/2015 at 11:25 AM  Between 7am to 6pm - Pager - [phone]  After 6pm: House Pager: - [phone]  [facility]  Office  [phone]  CC: Primary care physician; Marisue Ivan, MD

## 2015-11-11 LAB — CBC
HEMATOCRIT: 28.7 % — AB (ref 40.0–52.0)
HEMOGLOBIN: 9.9 g/dL — AB (ref 13.0–18.0)
MCH: 30.6 pg (ref 26.0–34.0)
MCHC: 34.3 g/dL (ref 32.0–36.0)
MCV: 89.2 fL (ref 80.0–100.0)
Platelets: 234 10*3/uL (ref 150–440)
RBC: 3.22 MIL/uL — ABNORMAL LOW (ref 4.40–5.90)
RDW: 15.1 % — ABNORMAL HIGH (ref 11.5–14.5)
WBC: 6.1 10*3/uL (ref 3.8–10.6)

## 2015-11-11 LAB — GLUCOSE, CAPILLARY
GLUCOSE-CAPILLARY: 145 mg/dL — AB (ref 65–99)
Glucose-Capillary: 127 mg/dL — ABNORMAL HIGH (ref 65–99)
Glucose-Capillary: 226 mg/dL — ABNORMAL HIGH (ref 65–99)
Glucose-Capillary: 196 mg/dL — ABNORMAL HIGH (ref 65–99)

## 2015-11-11 MED ORDER — AMLODIPINE BESYLATE 5 MG PO TABS: 5.0000 mg | ORAL_TABLET | Freq: Every day | ORAL | Status: DC

## 2015-11-11 MED ORDER — POLYETHYLENE GLYCOL 3350 17 G PO PACK
17.0000 g | PACK | Freq: Every day | ORAL | Status: DC | PRN
  Administered 2015-11-15: 17 g via ORAL
  Filled 2015-11-11: qty 1

## 2015-11-11 MED ORDER — AMLODIPINE BESYLATE 5 MG PO TABS
5.0000 mg | ORAL_TABLET | Freq: Every day | ORAL | Status: AC
  Administered 2015-11-11: 5 mg via ORAL

## 2015-11-11 NOTE — Progress Notes (Signed)
Patient is alert to self only. Incont of urine. LBM x2  this shift x2. ABD semi-firm with good bowels sounds. Good appetite. Blood sugars with SS insulin given. See NN about BP. IV fluids running at 50cc/hr. No N&V this shift.

## 2015-11-11 NOTE — Care Management Important Message (Signed)
Important Message  Patient Details  Name: William Byrd MRN: [id number] Date of Birth: 01/05/1932   Medicare Important Message Given:  Yes    Ashiyah Pavlak A, RN 11/11/2015, 3:44 PM

## 2015-11-11 NOTE — Clinical Social Work Note (Signed)
CSW received consult that patient admitted from SNF. CSW visited patient and his wife at bedside to confirm. The patient is not from a SNF, according to his wife. He has HHPT and HHOT from [facility], and the patient's wife declined SNF at dc. [facility]. CSW signing off.  Argentina PonderKaren Martha Madelina Sanda, MSW, LCSW-A [phone]

## 2015-11-11 NOTE — Progress Notes (Signed)
Patient bp 163/77, after BP meds given this am. Notified MD. New orders for a one time dose of Norvasc 5mg  PO, now.

## 2015-11-11 NOTE — Progress Notes (Signed)
[facility] - [place] [facility]   PATIENT NAME: William Byrd    MRN#:  [id number]  DATE OF BIRTH:  [birth year]  SUBJECTIVE:  Hospital Day: 2 days William Byrd is a 82 y.o. male presenting with . Nausea vomiting shortness of breath  Overnight events: No acute overnight events Interval Events: No complaints at this time, currently on room air-Had bowel movement this morning  REVIEW OF SYSTEMS:  Unable to accurately obtain given dementia  DRUG ALLERGIES:   Allergies  Allergen Reactions  . Fosinopril Other (See Comments)    Reaction: Hyperkalemia     VITALS:  Blood pressure (!) 161/76, pulse 80, temperature 98 F (36.7 C), temperature source Oral, resp. rate 18, height 5\' 10"  (1.778 m), weight 79.9 kg (176 lb 3.2 oz), SpO2 96 %.  PHYSICAL EXAMINATION:  VITAL SIGNS: Vitals:   11/11/15 0437 11/11/15 0734  BP: (!) 180/75 (!) 161/76  Pulse: 89 80  Resp: 19 18  Temp: 98.3 F (36.8 C) 98 F (36.7 C)   GENERAL:82 y.o.male currently in no acute distress.  HEAD: Normocephalic, atraumatic.  EYES: Pupils equal, round, reactive to light. Extraocular muscles intact. No scleral icterus.  MOUTH: Moist mucosal membrane. Dentition intact. No abscess noted.  EAR, NOSE, THROAT: Clear without exudates. No external lesions.  NECK: Supple. No thyromegaly. No nodules. No JVD.  PULMONARY: Diminished breath sounds with scattered rhonchi on the left without wheeze . No use of accessory muscles, Good respiratory effort. good air entry bilaterally CHEST: Nontender to palpation.  CARDIOVASCULAR: S1 and S2. Regular rate and rhythm. No murmurs, rubs, or gallops. No edema. Pedal pulses 2+ bilaterally.  GASTROINTESTINAL: Soft, nontender, Mild distention. No masses. Positive bowel sounds. No hepatosplenomegaly.  MUSCULOSKELETAL: No swelling, clubbing, or edema. Range of motion full in all extremities.  NEUROLOGIC: Cranial nerves II through XII are intact. No gross focal  neurological deficits. Sensation intact. Reflexes intact.  SKIN: No ulceration, lesions, rashes, or cyanosis. Skin warm and dry. Turgor intact.  PSYCHIATRIC: Mood, affect within normal limits. The patient is awake, alert and oriented x self. Insight, judgment poor.      LABORATORY PANEL:   CBC  Recent Labs Lab 11/10/15 0326  WBC 10.5  HGB 9.6*  HCT 28.3*  PLT 246   ------------------------------------------------------------------------------------------------------------------  Chemistries   Recent Labs Lab 11/10/15 0326  NA 139  K 3.7  CL 106  CO2 27  GLUCOSE 140*  BUN 24*  CREATININE 1.91*  CALCIUM 8.5*  AST 18  ALT 12*  ALKPHOS 67  BILITOT 0.7   ------------------------------------------------------------------------------------------------------------------  Cardiac Enzymes  Recent Labs Lab 11/09/15 1549  TROPONINI <0.03   ------------------------------------------------------------------------------------------------------------------  RADIOLOGY:  Ct Abdomen Pelvis Wo Contrast  Result Date: 11/09/2015 CLINICAL DATA:  82 year old male with history of vomiting today. Generalized malaise. Abdominal distention. EXAM: CT ABDOMEN AND PELVIS WITHOUT CONTRAST TECHNIQUE: Multidetector CT imaging of the abdomen and pelvis was performed following the standard protocol without IV contrast. COMPARISON:  No priors. FINDINGS: Lower chest: Atherosclerotic calcifications in the left circumflex and right coronary arteries. Extensive peribronchovascular airspace disease in the left lower lobe, concerning for sequela of recent aspiration. Hepatobiliary: The numerous calcified granulomas are noted throughout the liver. No discrete cystic or solid hepatic lesions are noted. Unenhanced appearance of the gallbladder is normal. Pancreas: No pancreatic mass or peripancreatic inflammatory changes on today's noncontrast CT examination. Spleen: Innumerable calcified granulomas are noted  throughout the spleen. Adrenals/Urinary Tract: Nonobstructive calculi are noted within the lower pole collecting systems  of the kidneys bilaterally, measuring up to 4 mm on the left side. No calculi are noted along the course of either ureter or within the lumen of the urinary bladder. There is no hydroureteronephrosis. Unenhanced appearance of the kidneys is otherwise unremarkable. Unenhanced appearance of the urinary bladder is normal. Bilateral adrenal glands are normal in appearance. Stomach/Bowel: Unenhanced appearance of the stomach is normal. There is no pathologic dilatation of small bowel or colon. The appendix is not confidently identified and may be surgically absent. Regardless, there are no inflammatory changes noted adjacent to the cecum to suggest the presence of an acute appendicitis at this time. Large volume of stool in the rectal vault. Vascular/Lymphatic: Aortic atherosclerosis, without evidence of aneurysm in the abdominal or pelvic vasculature. No lymphadenopathy noted in the abdomen or pelvis. Reproductive: Prostate gland and seminal vesicles are unremarkable in appearance. Other: No significant volume of ascites.  No pneumoperitoneum. Musculoskeletal: There are no aggressive appearing lytic or blastic lesions noted in the visualized portions of the skeleton. IMPRESSION: 1. Findings in the left lower lobe, concerning for either sequela of recent aspiration or developing bronchopneumonia. 2. No acute findings in the abdomen or pelvis. 3. Nonobstructive calculi are noted within the collecting systems of the kidneys bilaterally measuring up to 4 mm in the lower pole collecting system of left kidney. However, there are no ureteral stones or findings to suggest urinary tract obstruction at this time. 4. Aortic atherosclerosis, in addition to at least 2 vessel coronary artery disease. 5. Large volume of stool in the rectal vault may suggest constipation. 6. Sequela of old granulomatous disease, as  above. Electronically Signed   By: Trudie Reedaniel  Entrikin M.D.   On: 11/09/2015 19:44   Dg Chest Port 1 View  Result Date: 11/09/2015 CLINICAL DATA:  New onset fever today.  History of pneumonia. EXAM: PORTABLE CHEST 1 VIEW COMPARISON:  PA and lateral chest 10/17/2015 and 07/15/2015. CT chest 12/28/2014. FINDINGS: Heart size is upper normal. Lungs are clear. No pneumothorax or pleural effusion. Splenic calcifications noted and unchanged. IMPRESSION: No acute disease. Electronically Signed   By: Drusilla Kannerhomas  Dalessio M.D.   On: 11/09/2015 16:14    EKG:   Orders placed or performed during the hospital encounter of 11/09/15  . ED EKG 12-Lead  . ED EKG 12-Lead  . EKG 12-Lead  . EKG 12-Lead  . EKG 12-Lead  . EKG 12-Lead    ASSESSMENT AND PLAN:   Barry DienesJames Dura is a 82 y.o. male presenting with Code Sepsis . Admitted 11/09/2015 : Day #: 2 days 1. Sepsis, present on admission: Secondary to pneumonia, Levaquin, check pro-calcitonin tomorrow likely discontinue antibiotics afterwards 2. Acute kidney injury: Resolved 3. Essential hypertension Norvasc Lopressor 4. Dementia: Namenda Aricept 5. Type 2 diabetes insulin requiring continue basal insulin and sliding coverage  Disposition: SNF placement  All the records are reviewed and case discussed with Care Management/Social Workerr. Management plans discussed with the patient, family and they are in agreement.  CODE STATUS: full TOTAL TIME TAKING CARE OF THIS PATIENT: 28 minutes.   POSSIBLE D/C IN 1-2DAYS, DEPENDING ON CLINICAL CONDITION.   Taimur Fier,  Mardi MainlandDavid K M.D on 11/11/2015 at 11:09 AM  Between 7am to 6pm - Pager - [phone]  After 6pm: House Pager: - [phone]  [facility]  Office  [phone]  CC: Primary care physician; [name], [name], MD

## 2015-11-12 LAB — MRSA PCR SCREENING: MRSA BY PCR: POSITIVE — AB

## 2015-11-12 LAB — BASIC METABOLIC PANEL
ANION GAP: 6 (ref 5–15)
BUN: 22 mg/dL — ABNORMAL HIGH (ref 6–20)
CALCIUM: 9.3 mg/dL (ref 8.9–10.3)
CHLORIDE: 107 mmol/L (ref 101–111)
CO2: 26 mmol/L (ref 22–32)
CREATININE: 1.68 mg/dL — AB (ref 0.61–1.24)
GFR calc non Af Amer: 36 mL/min — ABNORMAL LOW (ref >60)
GFR, EST AFRICAN AMERICAN: 42 mL/min — AB (ref >60)
Glucose, Bld: 154 mg/dL — ABNORMAL HIGH (ref 65–99)
Potassium: 3.3 mmol/L — ABNORMAL LOW (ref 3.5–5.1)
SODIUM: 139 mmol/L (ref 135–145)

## 2015-11-12 LAB — PROCALCITONIN: Procalcitonin: 1.42 ng/mL

## 2015-11-12 LAB — GLUCOSE, CAPILLARY
GLUCOSE-CAPILLARY: 135 mg/dL — AB (ref 65–99)
GLUCOSE-CAPILLARY: 156 mg/dL — AB (ref 65–99)
GLUCOSE-CAPILLARY: 131 mg/dL — AB (ref 65–99)
Glucose-Capillary: 159 mg/dL — ABNORMAL HIGH (ref 65–99)

## 2015-11-12 MED ORDER — VANCOMYCIN HCL 10 G IV SOLR
1250.0000 mg | Freq: Once | INTRAVENOUS | Status: AC
  Administered 2015-11-13 (×2): 1250 mg via INTRAVENOUS
  Filled 2015-11-12: qty 1250

## 2015-11-12 MED ORDER — VANCOMYCIN HCL 10 G IV SOLR
1250.0000 mg | INTRAVENOUS | Status: DC
  Administered 2015-11-14 – 2015-11-15 (×2): 1250 mg via INTRAVENOUS
  Filled 2015-11-12 (×3): qty 1250

## 2015-11-12 MED ORDER — SODIUM CHLORIDE 0.9 % IV SOLN
3.0000 g | Freq: Four times a day (QID) | INTRAVENOUS | Status: DC
  Administered 2015-11-12 – 2015-11-15 (×11): 3 g via INTRAVENOUS
  Filled 2015-11-12 (×17): qty 3

## 2015-11-12 MED ORDER — POTASSIUM CHLORIDE CRYS ER 20 MEQ PO TBCR
40.0000 meq | EXTENDED_RELEASE_TABLET | Freq: Once | ORAL | Status: AC
  Administered 2015-11-12: 40 meq via ORAL
  Filled 2015-11-12: qty 2

## 2015-11-12 NOTE — Progress Notes (Signed)
Chaplain rounding 1A was asked by a nurse to visit the patient. Patient was with his wife at the time the chaplain visited and looked tired but alert. Pt's wife asked for spiritual support for healing of the body and soul. Chaplain provided prayer for healing and presence.      11/12/15 1400  Clinical Encounter Type  Visited With Patient  Visit Type Spiritual support  Referral From Nurse  Spiritual Encounters  Spiritual Needs Prayer

## 2015-11-12 NOTE — [place] FL2 (Signed)
[place] MEDICAID FL2 LEVEL OF CARE SCREENING TOOL     IDENTIFICATION  Patient Name: William Byrd Birthdate: 29-Apr-1931 Sex: male Admission Date (Current Location): 11/09/2015  [place] and [place] Number:  Chiropodist and Address:  [facility], [address] [place], [place] [address]      Provider Number: [id number]  Attending Physician Name and Address:  Altamese Dilling, MD  Relative Name and Phone Number:       Current Level of Care: Hospital Recommended Level of Care: Skilled Nursing Facility Prior Approval Number:    Date Approved/Denied:   PASRR Number:    Discharge Plan: SNF    Current Diagnoses: Patient Active Problem List   Diagnosis Date Noted  . Sepsis due to pneumonia (HCC) 11/09/2015  . Pneumonia 10/17/2015  . CVA (cerebral infarction) 07/15/2015  . Dehydration 12/28/2014  . Acute on chronic renal failure (HCC) 12/28/2014  . Generalized weakness 12/28/2014  . Acute renal failure (ARF) (HCC) 12/28/2014  . Lower extremity weakness 09/05/2014  . DVT of lower extremity, bilateral (HCC) 09/05/2014  . DM2 (diabetes mellitus, type 2) (HCC) 09/05/2014  . HTN (hypertension) 09/05/2014  . Dementia 09/05/2014  . CKD (chronic kidney disease) stage 3, GFR 30-59 ml/min 09/05/2014  . HLD (hyperlipidemia) 09/05/2014  . Spinal stenosis 09/05/2014    Orientation RESPIRATION BLADDER Height & Weight     Self  Normal Incontinent Weight: 176 lb 3.2 oz (79.9 kg) Height:  5\' 10"  (177.8 cm)  BEHAVIORAL SYMPTOMS/MOOD NEUROLOGICAL BOWEL NUTRITION STATUS   (none )  (none) Incontinent Diet (Diet: Heart Healthy/ Carb Modified )  AMBULATORY STATUS COMMUNICATION OF NEEDS Skin   Extensive Assist Verbally PU Stage and Appropriate Care (Unstagable wound on left heel. )                       Personal Care Assistance Level of Assistance  Bathing, Feeding, Dressing Bathing Assistance: Limited assistance Feeding assistance:  Independent Dressing Assistance: Limited assistance     Functional Limitations Info  Sight, Hearing, Speech Sight Info: Adequate Hearing Info: Adequate Speech Info: Adequate    SPECIAL CARE FACTORS FREQUENCY  PT (By licensed PT), OT (By licensed OT)     PT Frequency:  (5) OT Frequency:  (5)            Contractures      Additional Factors Info  Code Status, Allergies, Insulin Sliding Scale Code Status Info:  (Full Code. ) Allergies Info:  (Fosinopril)   Insulin Sliding Scale Info:  (NovoLog Insulin Injections. )       Current Medications (11/12/2015):  This is the current hospital active medication list Current Facility-Administered Medications  Medication Dose Route Frequency Provider Last Rate Last Dose  . 0.9 %  sodium chloride infusion   Intravenous Continuous Alexis Hugelmeyer, DO 50 mL/hr at 11/11/15 1047    . acetaminophen (TYLENOL) tablet 650 mg  650 mg Oral Q6H PRN Alexis Hugelmeyer, DO       Or  . acetaminophen (TYLENOL) suppository 650 mg  650 mg Rectal Q6H PRN Alexis Hugelmeyer, DO      . acetaminophen (TYLENOL) tablet 650 mg  650 mg Oral Q6H PRN Alexis Hugelmeyer, DO      . albuterol (PROVENTIL) (2.5 MG/3ML) 0.083% nebulizer solution 3 mL  3 mL Inhalation Q4H PRN Alexis Hugelmeyer, DO      . amLODipine (NORVASC) tablet 5 mg  5 mg Oral Daily Alexis Hugelmeyer, DO   5 mg  at 11/12/15 0844  . atorvastatin (LIPITOR) tablet 20 mg  20 mg Oral Daily Alexis Hugelmeyer, DO   20 mg at 11/12/15 0843  . benzonatate (TESSALON) capsule 200 mg  200 mg Oral TID PRN Alexis Hugelmeyer, DO      . bisacodyl (DULCOLAX) EC tablet 5 mg  5 mg Oral Daily PRN Alexis Hugelmeyer, DO      . cholecalciferol (VITAMIN D) tablet 1,000 Units  1,000 Units Oral Daily Alexis Hugelmeyer, DO   1,000 Units at 11/12/15 0844  . docusate sodium (COLACE) capsule 100 mg  100 mg Oral Daily PRN Alexis Hugelmeyer, DO      . donepezil (ARICEPT) tablet 10 mg  10 mg Oral QHS Alexis Hugelmeyer, DO   10 mg at  11/11/15 2054  . DULoxetine (CYMBALTA) DR capsule 60 mg  60 mg Oral Daily Alexis Hugelmeyer, DO   60 mg at 11/12/15 0843  . feeding supplement (ENSURE ENLIVE) (ENSURE ENLIVE) liquid 237 mL  237 mL Oral BID BM Alexis Hugelmeyer, DO   237 mL at 11/11/15 1049  . finasteride (PROSCAR) tablet 5 mg  5 mg Oral Daily Alexis Hugelmeyer, DO   5 mg at 11/12/15 0843  . furosemide (LASIX) tablet 40 mg  40 mg Oral BID Alexis Hugelmeyer, DO   40 mg at 11/12/15 0843  . HYDROcodone-acetaminophen (NORCO/VICODIN) 5-325 MG per tablet 1-2 tablet  1-2 tablet Oral Q4H PRN Alexis Hugelmeyer, DO      . insulin aspart (novoLOG) injection 0-15 Units  0-15 Units Subcutaneous TID WC Alexis Hugelmeyer, DO   2 Units at 11/12/15 0844  . insulin aspart (novoLOG) injection 0-5 Units  0-5 Units Subcutaneous QHS Alexis Hugelmeyer, DO      . insulin detemir (LEVEMIR) injection 25 Units  25 Units Subcutaneous BID AC Alexis Hugelmeyer, DO   25 Units at 11/11/15 1718  . levofloxacin (LEVAQUIN) IVPB 750 mg  750 mg Intravenous Q48H Wyatt Hasteavid K Hower, MD   750 mg at 11/11/15 1050  . magnesium citrate solution 1 Bottle  1 Bottle Oral Once PRN Alexis Hugelmeyer, DO      . memantine (NAMENDA XR) 24 hr capsule 28 mg  28 mg Oral Daily Alexis Hugelmeyer, DO   28 mg at 11/12/15 0843  . metoprolol tartrate (LOPRESSOR) tablet 25 mg  25 mg Oral BID Alexis Hugelmeyer, DO   25 mg at 11/12/15 0843  . ondansetron (ZOFRAN) tablet 4 mg  4 mg Oral Q6H PRN Alexis Hugelmeyer, DO       Or  . ondansetron (ZOFRAN) injection 4 mg  4 mg Intravenous Q6H PRN Alexis Hugelmeyer, DO      . polyethylene glycol (MIRALAX / GLYCOLAX) packet 17 g  17 g Oral Daily PRN Wyatt Hasteavid K Hower, MD      . potassium chloride SA (K-DUR,KLOR-CON) CR tablet 20 mEq  20 mEq Oral Daily Alexis Hugelmeyer, DO   20 mEq at 11/12/15 0843  . pregabalin (LYRICA) capsule 75 mg  75 mg Oral Daily PRN Alexis Hugelmeyer, DO      . rivaroxaban (XARELTO) tablet 20 mg  20 mg Oral Q supper Alexis Hugelmeyer, DO    20 mg at 11/11/15 1718  . senna-docusate (Senokot-S) tablet 1 tablet  1 tablet Oral QHS PRN Alexis Hugelmeyer, DO      . sodium chloride flush (NS) 0.9 % injection 3 mL  3 mL Intravenous Q12H Alexis Hugelmeyer, DO   3 mL at 11/11/15 2055  . timolol (TIMOPTIC) 0.5 % ophthalmic solution 1  drop  1 drop Both Eyes BID Alexis Hugelmeyer, DO   1 drop at 11/11/15 2056  . zolpidem (AMBIEN) tablet 5 mg  5 mg Oral QHS PRN Alexis Hugelmeyer, DO         Discharge Medications: Please see discharge summary for a list of discharge medications.  Relevant Imaging Results:  Relevant Lab Results:   Additional Information  (SSN: [id number])  Sample, Darleen Crocker, LCSW

## 2015-11-12 NOTE — Clinical Social Work Note (Signed)
Clinical Social Work Assessment  Patient Details  Name: William Byrd MRN: [id number] Date of Birth: 1931/11/18  Date of referral:  11/12/15               Reason for consult:  Facility Placement                Permission sought to share information with:  Chartered certified accountant granted to share information::  Yes, Verbal Permission Granted  Name::      [place]::   [place]   Relationship::     Contact Information:     Housing/Transportation Living arrangements for the past 2 months:  [place] of Information:  Patient, Spouse Patient Interpreter Needed:  None Criminal Activity/Legal Involvement Pertinent to Current Situation/Hospitalization:  No - Comment as needed Significant Relationships:  Adult Children, Spouse Lives with:  Spouse Do you feel safe going back to the place where you live?  Yes Need for family participation in patient care:  Yes (Comment)  Care giving concerns:  Patient lives in [place] with his wife William Byrd.    Social Worker assessment / plan:  Holiday representative ([place]) received verbal consult from Chief Strategy Officer that patient's wife would like to start a SNF bed search and prefers [facility] or [facility]. FL2 complete and faxed out. [facility] and [facility] bother offered a bed. CSW met with patient and his wife William Byrd at bedside. Per wife they live in [place] and her adult children live in [place]. Per wife patient was at [facility] between June and July 2017. CSW presented bed offers to wife and patient. Patient reported that he wants to go home. Per wife she will take patient home. Wife reported that they want home health and she has a friend who will come in and help patient. Wife and patient declined SNF. RN case manager aware of above. CSW will continue to follow and assist as needed.   Employment status:  Retired Forensic scientist:  Medicare PT Recommendations:   [place] / Referral to community resources:  [place]  Patient/Family's Response to care:  Patient and wife declined SNF.   Patient/Family's Understanding of and Emotional Response to Diagnosis, Current Treatment, and Prognosis:  Patient's wife reported that she can prove 24/7 assistance to patient and wants to take him home.   Emotional Assessment Appearance:  Appears stated age Attitude/Demeanor/Rapport:    Affect (typically observed):  Pleasant Orientation:  Oriented to Self, Oriented to Place, Fluctuating Orientation (Suspected and/or reported Sundowners) Alcohol / Substance use:  Not Applicable Psych involvement (Current and /or in the community):  No (Comment)  Discharge Needs  Concerns to be addressed:  Discharge Planning Concerns Readmission within the last 30 days:  No Current discharge risk:  Dependent with Mobility Barriers to Discharge:  Continued Medical Work up   [employer], Veronia Beets, LCSW 11/12/2015, 11:52 AM

## 2015-11-12 NOTE — Clinical Social Work Placement (Signed)
   CLINICAL SOCIAL WORK PLACEMENT  NOTE  Date:  11/12/2015  Patient Details  Name: William Byrd MRN: [id number] Date of Birth: 05/24/1931  Clinical Social Work is seeking post-discharge placement for this patient at the Skilled  Nursing Facility level of care (*CSW will initial, date and re-position this form in  chart as items are completed):  Yes   Patient/family provided with [place] Clinical Social Work Department's list of facilities offering this level of care within the geographic area requested by the patient (or if unable, by the patient's family).  Yes   Patient/family informed of their freedom to choose among providers that offer the needed level of care, that participate in Medicare, Medicaid or managed care program needed by the patient, have an available bed and are willing to accept the patient.  Yes   Patient/family informed of 's ownership interest in [facility] and [facility], as well as of the fact that they are under no obligation to receive care at these facilities.  PASRR submitted to EDS on  ([place] Must was down on 11/12/15 )     PASRR number received on       Existing PASRR number confirmed on       FL2 transmitted to all facilities in geographic area requested by pt/family on 11/12/15     FL2 transmitted to all facilities within larger geographic area on       Patient informed that his/her managed care company has contracts with or will negotiate with certain facilities, including the following:        Yes   Patient/family informed of bed offers received.  Patient chooses bed at       Physician recommends and patient chooses bed at      Patient to be transferred to   on  .  Patient to be transferred to facility by       Patient family notified on   of transfer.  Name of family member notified:        PHYSICIAN       Additional Comment:    _______________________________________________ Khadeja Abt, Darleen CrockerBailey M,  LCSW 11/12/2015, 11:51 AM

## 2015-11-12 NOTE — Care Management Note (Signed)
Case Management Note  Patient Details  Name: William Byrd MRN: [id number] Date of Birth: 12/07/1931  Subjective/Objective:                   Spoke with patient's wife William Byrd. She is interested in SNF. She agrees to [facility]. She cares for him alone. She is still undecided about SNF or home health though.  He has not walked in a little less than a month. He has a hospital bed. Wheelchair. Has front-wheeled walker and bedside commode. PCP is Dr. Burnadette PopLinthavong. No home O2. Has dementia per wife. Hoyer Lift he's been using a little less than a month. Had lady that came in for 2 hours lets wife take a break but was let go. Wife now has CNA now through [facility] which is short term.   He went to SNF in July ([facility]). She thinks he was there around 20 days.She would need EMS to get him home and please add social worker if he goes home.  Action/Plan:  CSW updated.   Expected Discharge Date:                  Expected Discharge Plan:     In-House Referral:     Discharge planning Services     Post Acute Care Choice:    Choice offered to:  Spouse  DME Arranged:    DME Agency:     HH Arranged:    HH Agency:     Status of Service:     If discussed at [facility] Length of Stay Meetings, dates discussed:    Additional Comments:  William Siadngela Latanja Lehenbauer, RN 11/12/2015, 7:51 AM

## 2015-11-12 NOTE — Progress Notes (Signed)
Pharmacy Antibiotic Note  William Byrd is a 82 y.o. male admitted on 11/09/2015 with pneumonia.  Pharmacy has been consulted for Vancomycin and Unasyn dosing. Patient from nursing home with PNA. Patient was admitted 10/17/15 for acute resp.failure and was on Levaquin.  ? HCAP/Aspiration PNA.  Plan: Unasyn 3 gm IV q6h.  Vancomycin 1250 IV every 24 hours.  Goal trough 15-20 mcg/mL.  Will give stacked dosing and check trough prior to 4th dose on 10/5. Monitor renal fxn. MRSA PCR has been ordered.  Ke 0.033  T1/2  21 hrs   Vd 55.9.     Height: 5\' 10"  (177.8 cm) Weight: 176 lb 3.2 oz (79.9 kg) IBW/kg (Calculated) : 73  Temp (24hrs), Avg:98.4 F (36.9 C), Min:98.4 F (36.9 C), Max:98.4 F (36.9 C)   Recent Labs Lab 11/09/15 1549 11/09/15 1841 11/10/15 0326 11/11/15 1135 11/12/15 0324  WBC 13.7*  --  10.5 6.1  --   CREATININE 2.05*  --  1.91*  --  1.68*  LATICACIDVEN 2.3* 1.5  --   --   --     Estimated Creatinine Clearance: 35 mL/min (by C-G formula based on SCr of 1.68 mg/dL (H)).    Allergies  Allergen Reactions  . Fosinopril Other (See Comments)    Reaction: Hyperkalemia     Antimicrobials this admission: Vanc 9/29 >> once Pip/tazo 9-29 >> once Levofloxacin 9/29 >>  10/2  Vancomycin 10/2 >> Unasyn 10/2 >>  Dose adjustments this admission:    Microbiology results:   BCx:     UCx:      Sputum:    10/2 MRSA PCR: pending  Thank you for allowing pharmacy to be a part of this patient's care.  William Byrd A 11/12/2015 3:49 PM

## 2015-11-12 NOTE — Progress Notes (Signed)
Clinical Child psychotherapistocial Worker (CSW) received a call from patient's wife William Byrd who reported that she changed her mind because the doctor talked to her and she wants patient to go to [facility]. Per Kim [facility] when [facility]. CSW will continue to follow and assist as needed.   [employer]iley Izekiel Flegel, LCSW [phone]

## 2015-11-12 NOTE — Progress Notes (Signed)
Physical Therapy Treatment Patient Details Name: William Byrd MRN: [id number] DOB: Mar 03, 1931 Today's Date: 11/12/2015    History of Present Illness 82 yo male with onset of PNA and sepsis was admitted from SNF per chart, pt unable to give an accurate history.  PMHx:  tremors, dementia, DVT, CKD, OA, HTN, glaucoma, and peripheral neuropathy.     PT Comments    Pt presents with deficits in strength, mobility, gait, transfers, and balance.  Pt required max A during bed mobility training with max verbal and tactile cues using log roll technique.  Pt required +2 max A for sit to stand transfer and was only able to achieve 3/4 stand.  Sitting balance varied from close SBA to mod A to maintain upright position.  Pt will benefit from PT services to address above deficits for decreased caregiver assistance.     Follow Up Recommendations  SNF     Equipment Recommendations       Recommendations for Other Services       Precautions / Restrictions Precautions Precautions: Fall Restrictions Weight Bearing Restrictions: No    Mobility  Bed Mobility Overal bed mobility: Needs Assistance Bed Mobility: Supine to Sit;Sit to Supine     Supine to sit: Max assist Sit to supine: Max assist   General bed mobility comments: Max verbal and tactile cues for log roll sequencing and assist to place pt's hand on bed rail  Transfers Overall transfer level: Needs assistance Equipment used: Rolling walker (2 wheeled) Transfers: Sit to/from Stand Sit to Stand: +2 physical assistance;Max assist         General transfer comment: Performed anterior weight shifting prior to sit to stand attempt with pt requiring +2 max assist to achieve 3/4 stand.  Ambulation/Gait             General Gait Details: unable today   Stairs            Wheelchair Mobility    Modified Rankin (Stroke Patients Only)       Balance Overall balance assessment: Needs assistance   Sitting balance-Leahy  Scale: Poor     Standing balance support: Bilateral upper extremity supported Standing balance-Leahy Scale: Zero                      Cognition Arousal/Alertness: Awake/alert Behavior During Therapy: WFL for tasks assessed/performed Overall Cognitive Status: No family/caregiver present to determine baseline cognitive functioning                      Exercises Total Joint Exercises Ankle Circles/Pumps: AROM;Both;15 reps;10 reps Heel Slides: AAROM;Both;15 reps Hip ABduction/ADduction: AAROM;Both;15 reps;10 reps Straight Leg Raises: AAROM;Both;10 reps;15 reps Other Exercises Other Exercises: Seated core therex with BUE HHA and leading pt to reach outside BOS in various planes    General Comments        Pertinent Vitals/Pain Pain Assessment: No/denies pain    Home Living                      Prior Function            PT Goals (current goals can now be found in the care plan section)      Frequency    Min 2X/week      PT Plan Current plan remains appropriate    Co-evaluation             End of Session Equipment Utilized During Treatment: Gait belt  Activity Tolerance: Patient tolerated treatment well;Other (comment) (limited by cognition/functional weakness) Patient left: in bed;with bed alarm set;with call bell/phone within reach;with nursing/sitter in room     Time: [id number] PT Time Calculation (min) (ACUTE ONLY): 29 min  Charges:  $Therapeutic Exercise: 8-22 mins $Therapeutic Activity: 8-22 mins                    G Codes:      Elly Modena. Scott Dung Prien PT, DPT 11/12/15, 4:05 PM

## 2015-11-12 NOTE — Progress Notes (Signed)
[facility] [place] [facility]   PATIENT NAME: William Byrd    MRN#:  [id number]  DATE OF BIRTH:  08/30/1931  SUBJECTIVE:  Hospital Day: 3 days William Byrd is a 82 y.o. male presenting with . Nausea vomiting shortness of breath  Overnight events: No acute overnight events Interval Events: No complaints at this time, currently on room air-Had bowel movement , abdomen is distended. Still very weak.  REVIEW OF SYSTEMS:  Unable to accurately obtain given dementia  DRUG ALLERGIES:   Allergies  Allergen Reactions  . Fosinopril Other (See Comments)    Reaction: Hyperkalemia     VITALS:  Blood pressure (!) 153/73, pulse 82, temperature 98.1 F (36.7 C), temperature source Oral, resp. rate 18, height 5\' 10"  (1.778 m), weight 79.9 kg (176 lb 3.2 oz), SpO2 95 %.  PHYSICAL EXAMINATION:  VITAL SIGNS: Vitals:   11/12/15 0707 11/12/15 1551  BP: (!) 178/74 (!) 153/73  Pulse: 87 82  Resp: 16 18  Temp: 98.4 F (36.9 C) 98.1 F (36.7 C)   GENERAL:82 y.o.male currently in no acute distress.  HEAD: Normocephalic, atraumatic.  EYES: Pupils equal, round, reactive to light. Extraocular muscles intact. No scleral icterus.  MOUTH: Moist mucosal membrane. Dentition intact. No abscess noted.  EAR, NOSE, THROAT: Clear without exudates. No external lesions.  NECK: Supple. No thyromegaly. No nodules. No JVD.  PULMONARY: Diminished breath sounds with scattered rhonchi on the left without wheeze . No use of accessory muscles, Good respiratory effort. good air entry bilaterally CHEST: Nontender to palpation.  CARDIOVASCULAR: S1 and S2. Regular rate and rhythm. No murmurs, rubs, or gallops. No edema. Pedal pulses 2+ bilaterally.  GASTROINTESTINAL: Soft, nontender, Mild distention. No masses. Positive bowel sounds. No hepatosplenomegaly.  MUSCULOSKELETAL: No swelling, clubbing, or edema. Range of motion full in all extremities.  NEUROLOGIC: Cranial nerves II through  XII are intact. No gross focal neurological deficits. Sensation intact. Reflexes intact.  SKIN: No ulceration, lesions, rashes, or cyanosis. Skin warm and dry. Turgor intact.  PSYCHIATRIC: Mood, affect within normal limits. The patient is awake, alert and oriented x self. Insight, judgment poor.    LABORATORY PANEL:   CBC  Recent Labs Lab 11/11/15 1135  WBC 6.1  HGB 9.9*  HCT 28.7*  PLT 234   ------------------------------------------------------------------------------------------------------------------  Chemistries   Recent Labs Lab 11/10/15 0326 11/12/15 0324  NA 139 139  K 3.7 3.3*  CL 106 107  CO2 27 26  GLUCOSE 140* 154*  BUN 24* 22*  CREATININE 1.91* 1.68*  CALCIUM 8.5* 9.3  AST 18  --   ALT 12*  --   ALKPHOS 67  --   BILITOT 0.7  --    ------------------------------------------------------------------------------------------------------------------  Cardiac Enzymes  Recent Labs Lab 11/09/15 1549  TROPONINI <0.03   ------------------------------------------------------------------------------------------------------------------  RADIOLOGY:  No results found.  EKG:   Orders placed or performed during the hospital encounter of 11/09/15  . ED EKG 12-Lead  . ED EKG 12-Lead  . EKG 12-Lead  . EKG 12-Lead  . EKG 12-Lead  . EKG 12-Lead    ASSESSMENT AND PLAN:   William Byrd is a 82 y.o. male presenting with Code Sepsis . Admitted 11/09/2015 : Day #: 3 days 1. Sepsis, present on admission: Secondary to pneumonia,    Given levaquin 2 weeks ago and started again now, but pt is not improved now, and procalcitonin is high- so will broaden the coverage.    Will give Vanc, check MRSA screen.  Give Unasyn for possible aspirations     Check SLP eval. 2. Acute kidney injury: Resolved 3. Essential hypertension Norvasc Lopressor 4. Dementia: Namenda Aricept 5. Type 2 diabetes insulin requiring continue basal insulin and sliding coverage  Disposition:  SNF placement  All the records are reviewed and case discussed with Care Management/Social Workerr. Management plans discussed with the patient, family and they are in agreement.  CODE STATUS: full TOTAL TIME TAKING CARE OF THIS PATIENT: 35 minutes.   POSSIBLE D/C IN 1-2DAYS, DEPENDING ON CLINICAL CONDITION.   Altamese Dilling M.D on 11/12/2015 at 5:37 PM  Between 7am to 6pm - Pager - [phone]  After 6pm: House Pager: - [phone]  [facility]  Office  [phone]  CC: Primary care physician; Marisue Ivan, MD

## 2015-11-13 ENCOUNTER — Inpatient Hospital Stay: Payer: Medicare Other

## 2015-11-13 LAB — PROCALCITONIN: Procalcitonin: 0.73 ng/mL

## 2015-11-13 LAB — MAGNESIUM: MAGNESIUM: 1.9 mg/dL (ref 1.7–2.4)

## 2015-11-13 LAB — BASIC METABOLIC PANEL
Anion gap: 7 (ref 5–15)
BUN: 23 mg/dL — ABNORMAL HIGH (ref 6–20)
CALCIUM: 9 mg/dL (ref 8.9–10.3)
CO2: 26 mmol/L (ref 22–32)
CREATININE: 1.61 mg/dL — AB (ref 0.61–1.24)
Chloride: 106 mmol/L (ref 101–111)
GFR calc non Af Amer: 38 mL/min — ABNORMAL LOW (ref >60)
GFR, EST AFRICAN AMERICAN: 44 mL/min — AB (ref >60)
Glucose, Bld: 100 mg/dL — ABNORMAL HIGH (ref 65–99)
Potassium: 3.2 mmol/L — ABNORMAL LOW (ref 3.5–5.1)
Sodium: 139 mmol/L (ref 135–145)

## 2015-11-13 LAB — GLUCOSE, CAPILLARY
GLUCOSE-CAPILLARY: 303 mg/dL — AB (ref 65–99)
GLUCOSE-CAPILLARY: 310 mg/dL — AB (ref 65–99)
GLUCOSE-CAPILLARY: 216 mg/dL — AB (ref 65–99)
Glucose-Capillary: 93 mg/dL (ref 65–99)
Glucose-Capillary: 131 mg/dL — ABNORMAL HIGH (ref 65–99)

## 2015-11-13 MED ORDER — POTASSIUM CHLORIDE CRYS ER 20 MEQ PO TBCR
20.0000 meq | EXTENDED_RELEASE_TABLET | Freq: Two times a day (BID) | ORAL | Status: DC
  Administered 2015-11-13 – 2015-11-15 (×5): 20 meq via ORAL
  Filled 2015-11-13 (×5): qty 1

## 2015-11-13 MED ORDER — POTASSIUM CHLORIDE CRYS ER 20 MEQ PO TBCR
40.0000 meq | EXTENDED_RELEASE_TABLET | Freq: Once | ORAL | Status: AC
  Administered 2015-11-13: 40 meq via ORAL
  Filled 2015-11-13: qty 2

## 2015-11-13 MED ORDER — AMLODIPINE BESYLATE 10 MG PO TABS
10.0000 mg | ORAL_TABLET | Freq: Every day | ORAL | Status: DC
  Administered 2015-11-13 – 2015-11-15 (×3): 10 mg via ORAL
  Filled 2015-11-13 (×3): qty 1

## 2015-11-13 NOTE — Evaluation (Signed)
Clinical/Bedside Swallow Evaluation Patient Details  Name: William Byrd MRN: [id number] Date of Birth: 09/28/1931  Today's Date: 11/13/2015 Time: SLP Start Time (ACUTE ONLY): 1130 SLP Stop Time (ACUTE ONLY): 1230 SLP Time Calculation (min) (ACUTE ONLY): 60 min  Past Medical History:  Past Medical History:  Diagnosis Date  . Arthritis   . Background retinopathy   . Cervicalgia   . Chronic kidney disease   . Chronic systolic CHF (congestive heart failure) (HCC)   . Degeneration of lumbar or lumbosacral intervertebral disc   . Dementia   . Diabetes mellitus without complication (HCC)   . DVT (deep venous thrombosis) (HCC)   . Generalized weakness   . Glaucoma   . Hypercholesteremia   . Hypertension   . Long-term insulin use (HCC)   . Peripheral polyneuropathy (HCC)   . Peripheral vascular disease (HCC)   . Spinal stenosis    Past Surgical History:  Past Surgical History:  Procedure Laterality Date  . c-spine fusion N/A    HPI:  Pt is a 82 y.o. male with a known history of CHF, hypertension, hyperlipidemia, CK D stage III, DVT, dementia was in a usual state of health until this afternoon. He essentially emergency department from his nursing home secondary to vomiting. Patient was admitted here on 10/17/2015 with diagnosis of acute hypoxic respiratory failure secondary to pneumonia, community-acquired   Assessment / Plan / Recommendation Clinical Impression  Pt appeared to adequately tolerate PO trials of thin liquids, puree, and soft solids with no immediate overt s/s of aspiration. Pt appears at a reduced risk for aspiration following aspiration and reflux precautions. Pt and Wife were educated on aspiration and reflux precautions. Pt had a barium swallow study completed in 2013 revealing a narrowing and min slower movement of Pt's esophagus. Pt presented with min overal slower movements in general, suspect due to the dx of Dementia. ST services consulted with MD regarding a  medication for assisting with Pt's reflux, which has been an issue, per Pt's wife. Recommend a Dysphagia 3 diet with thin liquids, following aspiration and reflux precautions to ensure a safe swallow. Pt and Wife were educated on choosing softer foods for easier mastication and esophageal motility. Pt's wife was provided with educational handout with information on Reflux precautions. Per Nursing Pt does well with pills crushed in applesauce, ST services recommends this be the continued method of giving medications, as medicaitons are crushable. ST services will be available to reconsult as needed during Pt's admittance.      Aspiration Risk   (Reduced)    Diet Recommendation Thin liquid;Dysphagia 3 (Mech soft), Aspiration Precautions  Liquid Administration via: Cup;Straw Medication Administration: Crushed with puree (As able) Supervision: Staff to assist with self feeding;Intermittent supervision to cue for compensatory strategies Compensations: Minimize environmental distractions;Slow rate;Small sips/bites;Follow solids with liquid Postural Changes: Seated upright at 90 degrees;Remain upright for at least 30 minutes after po intake    Other  Recommendations Recommended Consults:  (Dietician) Oral Care Recommendations: Oral care BID;Staff/trained caregiver to provide oral care   Follow up Recommendations None      Frequency and Duration            Prognosis Prognosis for Safe Diet Advancement: Good Barriers to Reach Goals: Cognitive deficits      Swallow Study   General Date of Onset: 11/09/15 HPI: Pt is a 82 y.o. male with a known history of CHF, hypertension, hyperlipidemia, CK D stage III, DVT, dementia was in a usual state of health  until this afternoon. He essentially emergency department from his nursing home secondary to vomiting. Patient was admitted here on 10/17/2015 with diagnosis of acute hypoxic respiratory failure secondary to pneumonia, community-acquired Type of  Study: Bedside Swallow Evaluation Previous Swallow Assessment: None indicated Diet Prior to this Study: Regular;Thin liquids Temperature Spikes Noted: No (WBC 6.1) Respiratory Status: Room air History of Recent Intubation: No Behavior/Cognition: Alert;Cooperative;Pleasant mood Oral Cavity Assessment: Within Functional Limits Oral Care Completed by SLP: Recent completion by staff Oral Cavity - Dentition: Adequate natural dentition Vision: Functional for self-feeding Self-Feeding Abilities: Needs set up;Needs assist Patient Positioning: Upright in bed Baseline Vocal Quality: Normal Volitional Cough: Strong Volitional Swallow: Able to elicit    Oral/Motor/Sensory Function Overall Oral Motor/Sensory Function: Within functional limits   Ice Chips Ice chips: Not tested   Thin Liquid Thin Liquid: Within functional limits Presentation: Straw (4 trials)    Nectar Thick Nectar Thick Liquid: Not tested   Honey Thick Honey Thick Liquid: Not tested   Puree Puree: Within functional limits Presentation: Spoon (5 trials)   Solid   GO   Solid: Within functional limits Presentation: Spoon (2 trials - softened graham cracker in applesauce)        Altamese Dilling, B.A. Clinical Graduate Student 11/13/2015,4:29 PM  This information has been reviewed and agreed upon by this supervising clinician.  Jerilynn Som, MS, CCC-SLP

## 2015-11-13 NOTE — Progress Notes (Signed)
Per Selena BattenKim [facility]. Patient is now on Enteric precautions. Clinical Social Worker (CSW) will continue to follow and assist as needed.   Baker Hughes IncorporatedBailey Hazell Siwik, LCSW [phone]

## 2015-11-13 NOTE — Progress Notes (Signed)
Physical Therapy Treatment Patient Details Name: William Byrd MRN: [id number] DOB: 05/09/1931 Today's Date: 11/13/2015    History of Present Illness 82 yo male with onset of PNA and sepsis was admitted from SNF per chart, pt unable to give an accurate history.  PMHx:  tremors, dementia, DVT, CKD, OA, HTN, glaucoma, and peripheral neuropathy.     PT Comments    Pt in bed wife present, agreeable to therapy.  Performed supine to sit at EOB with max verbal/tactile cues for reaching for bed rail and using RUE to push from bed.  Max verbal/tactile cues for achieving midline with pt tending to lean to L. Pt able to maintain unsupported sitting in neutral for short bouts with mod cues. Sit to stand x 2 with max +2 assist and RW with cues for increased glute activation and PTA providing manual facilitation of moving pelvis anteriorly for improved posture. On second attempt pt able to take x 3 small steps toward recliner however required +2 assist to be seated into chair.  Pt left in chair with wife present and all current needs met. Nsg notified to use Hoyer to return to bed.    Follow Up Recommendations  SNF     Equipment Recommendations       Recommendations for Other Services       Precautions / Restrictions Precautions Precautions: Fall Restrictions Weight Bearing Restrictions: No    Mobility  Bed Mobility Overal bed mobility: Needs Assistance Bed Mobility: Supine to Sit     Supine to sit: Max assist     General bed mobility comments: Max verbal/tactile cues for sequencing and placing hand on bedrail  Transfers Overall transfer level: Needs assistance Equipment used: Rolling walker (2 wheeled) Transfers: Sit to/from Stand Sit to Stand: +2 physical assistance         General transfer comment: Cues for increased glute activation and acheiving/maintaining erect posture  Ambulation/Gait                 Stairs            Wheelchair Mobility    Modified  Rankin (Stroke Patients Only)       Balance Overall balance assessment: Needs assistance Sitting-balance support: Bilateral upper extremity supported;Feet supported Sitting balance-Leahy Scale: Fair Sitting balance - Comments: able to maintain unsupported sitting for short periods, pt would tend to lean to L    Standing balance support: Bilateral upper extremity supported Standing balance-Leahy Scale: Poor                      Cognition Arousal/Alertness: Awake/alert Behavior During Therapy: WFL for tasks assessed/performed                        Exercises Other Exercises Other Exercises: unsupported sitting for core activation    General Comments        Pertinent Vitals/Pain Pain Assessment: No/denies pain    Home Living                      Prior Function            PT Goals (current goals can now be found in the care plan section)      Frequency    Min 2X/week      PT Plan Current plan remains appropriate    Co-evaluation             End of Session Equipment   Utilized During Treatment: Gait belt Activity Tolerance: Patient tolerated treatment well Patient left: in chair;with call bell/phone within reach;with chair alarm set;with family/visitor present     Time: 1135-1201 PT Time Calculation (min) (ACUTE ONLY): 26 min  Charges:  $Therapeutic Activity: 23-37 mins                    G Codes:      Latiqua Daloia  Ramere Downs, PTA 11/13/2015, 12:15 PM

## 2015-11-13 NOTE — Care Management Important Message (Signed)
Important Message  Patient Details  Name: William Byrd MRN: [id number] Date of Birth: 07/25/1931   Medicare Important Message Given:  Yes    Collie SiadAngela Paul Torpey, RN 11/13/2015, 8:03 AM

## 2015-11-13 NOTE — Progress Notes (Signed)
[facility] - [place] at [facility]   PATIENT NAME: William Byrd    MRN#:  [id number]  DATE OF BIRTH:  09/17/1931  SUBJECTIVE:  Hospital Day: 4 days William Byrd is a 82 y.o. male presenting with . Nausea vomiting shortness of breath  Overnight events: No acute overnight events Interval Events: No complaints at this time, currently on room air-Had bowel movement , abdomen is distended. Still very weak.  had episode of loose stool 3-4 episodes I last 24 hours.  REVIEW OF SYSTEMS:  Unable to accurately obtain given dementia  DRUG ALLERGIES:   Allergies  Allergen Reactions  . Fosinopril Other (See Comments)    Reaction: Hyperkalemia     VITALS:  Blood pressure 136/71, pulse 85, temperature 98.6 F (37 C), temperature source Oral, resp. rate 18, height 5\' 10"  (1.778 m), weight 79.9 kg (176 lb 3.2 oz), SpO2 97 %.  PHYSICAL EXAMINATION:  VITAL SIGNS: Vitals:   11/13/15 1046 11/13/15 1538  BP: (!) 158/83 136/71  Pulse: 76 85  Resp:  18  Temp:  98.6 F (37 C)   GENERAL:82 y.o.male currently in no acute distress.  HEAD: Normocephalic, atraumatic.  EYES: Pupils equal, round, reactive to light. Extraocular muscles intact. No scleral icterus.  MOUTH: Moist mucosal membrane. Dentition intact. No abscess noted.  EAR, NOSE, THROAT: Clear without exudates. No external lesions.  NECK: Supple. No thyromegaly. No nodules. No JVD.  PULMONARY: Diminished breath sounds with scattered rhonchi on the left without wheeze . No use of accessory muscles, Good respiratory effort. good air entry bilaterally CHEST: Nontender to palpation.  CARDIOVASCULAR: S1 and S2. Regular rate and rhythm. No murmurs, rubs, or gallops. No edema. Pedal pulses 2+ bilaterally.  GASTROINTESTINAL: Soft, nontender, Mild distention. No masses. Positive bowel sounds. No hepatosplenomegaly.  MUSCULOSKELETAL: No swelling, clubbing, or edema. Range of motion full in all extremities.  NEUROLOGIC:  Cranial nerves II through XII are intact. No gross focal neurological deficits. Sensation intact. Reflexes intact.  SKIN: No ulceration, lesions, rashes, or cyanosis. Skin warm and dry. Turgor intact.  PSYCHIATRIC: Mood, affect within normal limits. The patient is awake, alert and oriented x self. Insight, judgment poor.    LABORATORY PANEL:   CBC  Recent Labs Lab 11/11/15 1135  WBC 6.1  HGB 9.9*  HCT 28.7*  PLT 234   ------------------------------------------------------------------------------------------------------------------  Chemistries   Recent Labs Lab 11/10/15 0326  11/13/15 0426  NA 139  < > 139  K 3.7  < > 3.2*  CL 106  < > 106  CO2 27  < > 26  GLUCOSE 140*  < > 100*  BUN 24*  < > 23*  CREATININE 1.91*  < > 1.61*  CALCIUM 8.5*  < > 9.0  MG  --   --  1.9  AST 18  --   --   ALT 12*  --   --   ALKPHOS 67  --   --   BILITOT 0.7  --   --   < > = values in this interval not displayed. ------------------------------------------------------------------------------------------------------------------  Cardiac Enzymes  Recent Labs Lab 11/09/15 1549  TROPONINI <0.03   ------------------------------------------------------------------------------------------------------------------  RADIOLOGY:  Dg Chest 2 View  Result Date: 11/13/2015 CLINICAL DATA:  Nausea and vomiting since yesterday. History of CHF, diabetes, hypertension, chronic renal insufficiency, former smoker. EXAM: CHEST  2 VIEW COMPARISON:  Portable chest x-ray of November 09, 2015 FINDINGS: The lungs are less well inflated today. The interstitial markings are mildly increased. There  is linear increased density at the lung bases greatest on the right. The heart is top-normal in size. The pulmonary vascularity is not engorged. There is calcification in the wall of the aortic arch. The observed bony thorax is unremarkable. Numerous calcifications project in the left upper quadrant of the abdomen likely  in the spleen. IMPRESSION: Mild hypoinflation with bibasilar atelectasis. No CHF. Previous granulomatous infection of the spleen Aortic atherosclerosis. Electronically Signed   By: David  SwazilandJordan M.D.   On: 11/13/2015 07:33    EKG:   Orders placed or performed during the hospital encounter of 11/09/15  . ED EKG 12-Lead  . ED EKG 12-Lead  . EKG 12-Lead  . EKG 12-Lead  . EKG 12-Lead  . EKG 12-Lead    ASSESSMENT AND PLAN:   William Byrd is a 82 y.o. male presenting with Code Sepsis . Admitted 11/09/2015 : Day #: 4 days   1. Sepsis, present on admission: Secondary to pneumonia,     Given levaquin 2 weeks ago and started again now, but pt is not improved now, and procalcitonin is high- so will     broaden the coverage.     Will give Vanc, positive for MRSA screen.    Give Unasyn for possible aspirations    Check SLP eval. Started on dysphagia 3 diet as per them. 2. Acute kidney injury: Resolved 3. Essential hypertension Norvasc Lopressor 4. Dementia: Namenda Aricept 5. Type 2 diabetes insulin requiring continue basal insulin and sliding coverage 6. Abdominal distension and loose stool   Check for c diff in stool.  Disposition: SNF placement  All the records are reviewed and case discussed with Care Management/Social Workerr. Management plans discussed with the patient, family and they are in agreement.  CODE STATUS: full TOTAL TIME TAKING CARE OF THIS PATIENT: 35 minutes.   POSSIBLE D/C IN 1-2DAYS, DEPENDING ON CLINICAL CONDITION.   Altamese DillingVACHHANI, Augusten Lipkin M.D on 11/13/2015 at 4:52 PM  Between 7am to 6pm - Pager - [phone]  After 6pm: House Pager: - [phone]  [facility]  Office  [phone]  CC: Primary care physician; [name], [name], MD

## 2015-11-13 NOTE — Progress Notes (Signed)
Put in order for cdiff since patient has had more than 3 loose stools in 24 hr per Dr. Elisabeth PigeonVachhani.

## 2015-11-14 ENCOUNTER — Inpatient Hospital Stay: Payer: Medicare Other

## 2015-11-14 LAB — C DIFFICILE QUICK SCREEN W PCR REFLEX
C DIFFICLE (CDIFF) ANTIGEN: NEGATIVE
C Diff interpretation: NOT DETECTED
C Diff toxin: NEGATIVE

## 2015-11-14 LAB — BASIC METABOLIC PANEL
Anion gap: 5 (ref 5–15)
BUN: 32 mg/dL — AB (ref 6–20)
CHLORIDE: 108 mmol/L (ref 101–111)
CO2: 28 mmol/L (ref 22–32)
CREATININE: 1.72 mg/dL — AB (ref 0.61–1.24)
Calcium: 9 mg/dL (ref 8.9–10.3)
GFR calc Af Amer: 41 mL/min — ABNORMAL LOW (ref >60)
GFR calc non Af Amer: 35 mL/min — ABNORMAL LOW (ref >60)
GLUCOSE: 51 mg/dL — AB (ref 65–99)
Potassium: 3.4 mmol/L — ABNORMAL LOW (ref 3.5–5.1)
Sodium: 141 mmol/L (ref 135–145)

## 2015-11-14 LAB — CULTURE, BLOOD (ROUTINE X 2)
CULTURE: NO GROWTH
CULTURE: NO GROWTH

## 2015-11-14 LAB — GLUCOSE, CAPILLARY
Glucose-Capillary: 46 mg/dL — ABNORMAL LOW (ref 65–99)
Glucose-Capillary: 171 mg/dL — ABNORMAL HIGH (ref 65–99)
Glucose-Capillary: 41 mg/dL — CL (ref 65–99)
Glucose-Capillary: 210 mg/dL — ABNORMAL HIGH (ref 65–99)
Glucose-Capillary: 261 mg/dL — ABNORMAL HIGH (ref 65–99)
Glucose-Capillary: 191 mg/dL — ABNORMAL HIGH (ref 65–99)

## 2015-11-14 MED ORDER — INSULIN DETEMIR 100 UNIT/ML ~~LOC~~ SOLN
10.0000 [IU] | Freq: Two times a day (BID) | SUBCUTANEOUS | Status: DC
  Administered 2015-11-14 – 2015-11-15 (×2): 10 [IU] via SUBCUTANEOUS
  Filled 2015-11-14 (×4): qty 0.1

## 2015-11-14 MED ORDER — INSULIN DETEMIR 100 UNIT/ML ~~LOC~~ SOLN: 10.0000 [IU] | Freq: Two times a day (BID) | SUBCUTANEOUS | 11 refills | Status: DC

## 2015-11-14 MED ORDER — DOXYCYCLINE HYCLATE 50 MG PO CAPS: 50.0000 mg | ORAL_CAPSULE | Freq: Two times a day (BID) | ORAL | 0 refills | Status: AC

## 2015-11-14 MED ORDER — PANTOPRAZOLE SODIUM 40 MG PO TBEC: 40.0000 mg | DELAYED_RELEASE_TABLET | Freq: Every day | ORAL | 0 refills | Status: DC

## 2015-11-14 MED ORDER — AMOXICILLIN-POT CLAVULANATE 250-62.5 MG/5ML PO SUSR: 500.0000 mg | Freq: Two times a day (BID) | ORAL | 0 refills | Status: AC

## 2015-11-14 MED ORDER — PANTOPRAZOLE SODIUM 40 MG PO TBEC
40.0000 mg | DELAYED_RELEASE_TABLET | Freq: Every day | ORAL | Status: DC
  Administered 2015-11-14 – 2015-11-15 (×2): 40 mg via ORAL
  Filled 2015-11-14 (×2): qty 1

## 2015-11-14 MED ORDER — DEXTROSE 50 % IV SOLN
50.0000 mL | Freq: Once | INTRAVENOUS | Status: AC
  Administered 2015-11-14: 50 mL via INTRAVENOUS

## 2015-11-14 MED ORDER — DEXTROSE 50 % IV SOLN
INTRAVENOUS | Status: AC
  Filled 2015-11-14: qty 50

## 2015-11-14 MED ORDER — SENNA 8.6 MG PO TABS
1.0000 | ORAL_TABLET | Freq: Every day | ORAL | Status: DC
  Administered 2015-11-14 – 2015-11-15 (×2): 8.6 mg via ORAL
  Filled 2015-11-14 (×3): qty 1

## 2015-11-14 MED ORDER — POTASSIUM CHLORIDE CRYS ER 20 MEQ PO TBCR: 20.0000 meq | EXTENDED_RELEASE_TABLET | Freq: Two times a day (BID) | ORAL | 0 refills | Status: DC

## 2015-11-14 MED ORDER — AMLODIPINE BESYLATE 10 MG PO TABS: 10.0000 mg | ORAL_TABLET | Freq: Every day | ORAL | 0 refills | Status: DC

## 2015-11-14 MED ORDER — POTASSIUM CHLORIDE 20 MEQ PO PACK
40.0000 meq | PACK | Freq: Once | ORAL | Status: AC
  Administered 2015-11-14: 40 meq via ORAL
  Filled 2015-11-14: qty 2

## 2015-11-14 MED ORDER — SENNA 8.6 MG PO TABS: 1.0000 | ORAL_TABLET | Freq: Every day | ORAL | 0 refills | Status: DC

## 2015-11-14 MED ORDER — HYDROCODONE-ACETAMINOPHEN 5-325 MG PO TABS: 1.0000 | ORAL_TABLET | Freq: Four times a day (QID) | ORAL | 0 refills | Status: DC | PRN

## 2015-11-14 MED ORDER — CHLORHEXIDINE GLUCONATE CLOTH 2 % EX PADS
6.0000 | MEDICATED_PAD | Freq: Every day | CUTANEOUS | Status: DC
  Administered 2015-11-15: 6 via TOPICAL

## 2015-11-14 MED ORDER — BISACODYL 5 MG PO TBEC: 5.0000 mg | DELAYED_RELEASE_TABLET | Freq: Every day | ORAL | 0 refills | Status: AC | PRN

## 2015-11-14 MED ORDER — MUPIROCIN 2 % EX OINT
1.0000 "application " | TOPICAL_OINTMENT | Freq: Two times a day (BID) | CUTANEOUS | Status: DC
  Administered 2015-11-14 – 2015-11-15 (×2): 1 via NASAL
  Filled 2015-11-14: qty 22

## 2015-11-14 NOTE — Progress Notes (Signed)
Barry DienesStephens, Baltasar C: 136A: Chaplain made a follow-up visit to see patient whom he had seen in 1A earlier in the week. Patient appeared to be doing well since chaplain see him last and he and his wife were very grateful that the chaplain visited tham again. They requested for prayers and spiritual support. Chaplain provided prayers for healing and presence.

## 2015-11-14 NOTE — Progress Notes (Signed)
Speech Language Pathology Treatment: Dysphagia  Patient Details Name: William Byrd MRN: [id number] DOB: 04/21/1931 Today's Date: 11/14/2015 Time: 0830-0900 SLP Time Calculation (min) (ACUTE ONLY): 30 min  Assessment / Plan / Recommendation Clinical Impression  Pt appeared to adequately tolerate PO trials of thin liquids, given via straw, puree, and solids with no immediate overt s/s of aspiration. Pt did exhibit prolonged mastication time during the oral phase, suspect due to dx of Dementia. Pt appears at a reduced risk for aspiration following strict aspiration and reflux precautions. Pt requires total assistance during feeding with minimal verbal and tactile cues during meal to attend to the task. Recommend Dysphagia 3 diet with thin liquids, however a Dysphagia 2 diet may be appropriate in the future if it would assist in supporting nutritional intake. ST services consulted MD and RN regarding a Dietician Consult due to poor PO intake with prolonged mastication in the oral phase, MD agreed and order for Dietician Consult was placed. Wife was educated on importance of providing cues to maintain Pt's attention to task and PO intake for nutritional support. ST services to follow up with diet toleration as needed during Pt's admittance.     HPI HPI: Pt is a 82 y.o. male with a known history of CHF, hypertension, hyperlipidemia, CK D stage III, DVT, dementia was in a usual state of health until this afternoon. He essentially emergency department from his nursing home secondary to vomiting. Patient was admitted here on 10/17/2015 with diagnosis of acute hypoxic respiratory failure secondary to pneumonia, community-acquired      SLP Plan  Continue with current plan of care     Recommendations  Diet recommendations: Dysphagia 3 (mechanical soft);Thin liquid Liquids provided via: Cup;Straw Medication Administration: Crushed with puree Supervision: Full supervision/cueing for compensatory  strategies Compensations: Minimize environmental distractions;Slow rate;Small sips/bites;Follow solids with liquid Postural Changes and/or Swallow Maneuvers: Seated upright 90 degrees;Upright 30-60 min after meal                General recommendations:  (Dietician Consult) Oral Care Recommendations: Oral care BID;Staff/trained caregiver to provide oral care Follow up Recommendations: None Plan: Continue with current plan of care       GO                Altamese DillingLauren Junelle Hashemi, B.A. Clinical Graduate Student 11/14/2015, 11:36 AM

## 2015-11-14 NOTE — Progress Notes (Signed)
Inpatient Diabetes Program Recommendations  AACE/ADA: New Consensus Statement on Inpatient Glycemic Control (2015)  Target Ranges:  Prepandial:   less than 140 mg/dL      Peak postprandial:   less than 180 mg/dL (1-2 hours)      Critically ill patients:  140 - 180 mg/dL   Results for William Byrd, William Byrd (MRN [id number]) as of 11/14/2015 08:27  Ref. Range 11/13/2015 07:22 11/13/2015 11:39 11/13/2015 16:32 11/13/2015 16:35 11/13/2015 20:56  Glucose-Capillary Latest Ref Range: 65 - 99 mg/dL 93 409131 (H) 811310 (H) 914303 (H) 216 (H)   Review of Glycemic Control  Diabetes history: DM2 Outpatient Diabetes medications: NPH 25 units QAM, NPH 30 units QPM, Tradjenta 5 mg daily Current orders for Inpatient glycemic control: Novolog 0-15 units TID with meals, Novolog 0-5 units QHS  Inpatient Diabetes Program Recommendations: Insulin - Basal: Fasting glucose 51 mg/dl this morning. Noted Lantus was discontinued this morning. Patient received Lantus 25 units at 9:32 am and 25 units at 17:15 pm 11/13/15. Please consider re-ordering Lantus at 25 units QHS (once a day).  Thanks, Orlando PennerMarie Erlean Mealor, RN, MSN, CDE Diabetes Coordinator Inpatient Diabetes Program [phone] (Team Pager from 8am to 5pm) [phone] ([facility] office) [phone] [facility] office) [phone] [facility] office)

## 2015-11-14 NOTE — Progress Notes (Signed)
Physical Therapy Treatment Patient Details Name: Sampson SiJames C Custer MRN: [id number] DOB: 10/19/1931 Today's Date: 11/14/2015    History of Present Illness 82 yo male with onset of PNA and sepsis was admitted from SNF per chart, pt unable to give an accurate history.  PMHx:  tremors, dementia, DVT, CKD, OA, HTN, glaucoma, and peripheral neuropathy.     PT Comments    Pt in bed agrees to session.  Requires +2 max for all mobility and transfers.  .Participated in exercises as described below.  Pt incontinent of urine and BM.  Pt reported he was unaware of needing to bed cleaned.  Education provided.   Follow Up Recommendations  SNF     Equipment Recommendations  None recommended by PT    Recommendations for Other Services       Precautions / Restrictions Precautions Precautions: Fall Restrictions Weight Bearing Restrictions: No    Mobility  Bed Mobility Overal bed mobility: Needs Assistance Bed Mobility: Supine to Sit     Supine to sit: Max assist;+2 for physical assistance        Transfers Overall transfer level: Needs assistance Equipment used: Rolling walker (2 wheeled) Transfers: Sit to/from Stand Sit to Stand: +2 physical assistance;Max assist            Ambulation/Gait Ambulation/Gait assistance: Max assist;+2 physical assistance Ambulation Distance (Feet): 3 Feet (small sliding steps to recliner at bedside) Assistive device: Rolling walker (2 wheeled) Gait Pattern/deviations: Step-to pattern;Narrow base of support (sliding)     General Gait Details: very poor quality, stooped posture   Stairs            Wheelchair Mobility    Modified Rankin (Stroke Patients Only)       Balance Overall balance assessment: Needs assistance Sitting-balance support: Feet supported Sitting balance-Leahy Scale: Fair Sitting balance - Comments: able to maintain unsupported sitting for short periods, pt would tend to lean to L    Standing balance support:  Bilateral upper extremity supported Standing balance-Leahy Scale: Poor Standing balance comment: unable to stand fully, flexed forward at hips                    Cognition Arousal/Alertness: Awake/alert Behavior During Therapy: WFL for tasks assessed/performed                        Exercises Other Exercises Other Exercises: seated AAROM for ankle pumps, LAQ, marches, and ab/adduction x 10 Other Exercises: pt incontinent of BM and urine - cleaned as appropriate    General Comments        Pertinent Vitals/Pain Pain Assessment: No/denies pain    Home Living                      Prior Function            PT Goals (current goals can now be found in the care plan section) Acute Rehab PT Goals Patient Stated Goal: none stated    Frequency    Min 2X/week      PT Plan Current plan remains appropriate    Co-evaluation             End of Session Equipment Utilized During Treatment: Gait belt Activity Tolerance: Patient tolerated treatment well Patient left: in chair;with call bell/phone within reach;with chair alarm set;with family/visitor present     Time: [id number] PT Time Calculation (min) (ACUTE ONLY): 24 min  Charges:  $Therapeutic Exercise: 8-22 mins $  Therapeutic Activity: 8-22 mins                    G Codes:      Danielle Dess 2015/11/24, 11:43 AM

## 2015-11-15 ENCOUNTER — Encounter
Admission: RE | Admit: 2015-11-15 | Discharge: 2015-11-15 | Disposition: A | Discharge status: Home / Self Care | Payer: Medicare Other | Source: Ambulatory Visit | Attending: Internal Medicine | Admitting: Internal Medicine

## 2015-11-15 ENCOUNTER — Inpatient Hospital Stay: Payer: Medicare Other

## 2015-11-15 LAB — BASIC METABOLIC PANEL
ANION GAP: 7 (ref 5–15)
BUN: 28 mg/dL — AB (ref 6–20)
CO2: 26 mmol/L (ref 22–32)
Calcium: 9 mg/dL (ref 8.9–10.3)
Chloride: 106 mmol/L (ref 101–111)
Creatinine, Ser: 1.72 mg/dL — ABNORMAL HIGH (ref 0.61–1.24)
GFR calc Af Amer: 41 mL/min — ABNORMAL LOW (ref >60)
GFR calc non Af Amer: 35 mL/min — ABNORMAL LOW (ref >60)
GLUCOSE: 166 mg/dL — AB (ref 65–99)
POTASSIUM: 3.7 mmol/L (ref 3.5–5.1)
Sodium: 139 mmol/L (ref 135–145)

## 2015-11-15 LAB — CBC
HCT: 28.6 % — ABNORMAL LOW (ref 40.0–52.0)
HEMOGLOBIN: 10 g/dL — AB (ref 13.0–18.0)
MCH: 31 pg (ref 26.0–34.0)
MCHC: 34.8 g/dL (ref 32.0–36.0)
MCV: 89 fL (ref 80.0–100.0)
PLATELETS: 254 10*3/uL (ref 150–440)
RBC: 3.22 MIL/uL — AB (ref 4.40–5.90)
RDW: 15.1 % — ABNORMAL HIGH (ref 11.5–14.5)
WBC: 5.6 10*3/uL (ref 3.8–10.6)

## 2015-11-15 LAB — GLUCOSE, CAPILLARY
Glucose-Capillary: 145 mg/dL — ABNORMAL HIGH (ref 65–99)
Glucose-Capillary: 258 mg/dL — ABNORMAL HIGH (ref 65–99)

## 2015-11-15 LAB — VANCOMYCIN, TROUGH: VANCOMYCIN TR: 14 ug/mL — AB (ref 15–20)

## 2015-11-15 LAB — PROCALCITONIN: PROCALCITONIN: 0.23 ng/mL

## 2015-11-15 MED ORDER — VANCOMYCIN HCL 10 G IV SOLR
1250.0000 mg | INTRAVENOUS | Status: DC
  Administered 2015-11-15: 1250 mg via INTRAVENOUS
  Filled 2015-11-15 (×2): qty 1250

## 2015-11-15 MED ORDER — MUPIROCIN 2 % EX OINT: 1.0000 "application " | TOPICAL_OINTMENT | Freq: Two times a day (BID) | CUTANEOUS | 0 refills | Status: AC

## 2015-11-15 NOTE — Progress Notes (Signed)
Patient is medically stable for [facility] patient will go to private room 213. RN will call report at [phone] and arrange EMS for transport. Social work Tax inspectorintern sent D/C orders to [facility] via [employer]. Patients wife Luster LandsbergFannie was a bedside and is aware of discharge today. Please reconsult if future CSW needs arise. Social work Administrator, artsintern signing off.   Ralene BatheMacKenzie [place], Social Work Intern [phone]

## 2015-11-15 NOTE — Discharge Instructions (Signed)
°   Found to have colonic dilation on x ray without blockage, having loose stools, C diff negative. I spoke to general surgeon and she reviewed the xray-there is no blockage.    Suggested to continue senna and to expect loose stool until the colon empty in next few days.   Suggested to keep potassium in normal range.

## 2015-11-15 NOTE — Progress Notes (Signed)
[facility]. [facility] to [facility]. Patient changed into new gown and diaper changed.

## 2015-11-15 NOTE — Discharge Summary (Signed)
[facility] - [place] at [facility]   PATIENT NAME: William Byrd    MR#:  [id number]  DATE OF BIRTH:  1931/04/14  DATE OF ADMISSION:  11/09/2015 ADMITTING PHYSICIAN: Tonye Royalty, DO  DATE OF DISCHARGE: 11/15/2015  PRIMARY CARE PHYSICIAN: Marisue Ivan, MD    ADMISSION DIAGNOSIS:  Abdominal distention [R14.0] Acute on chronic renal insufficiency [N28.9, N18.9] HCAP (healthcare-associated pneumonia) [J18.9] Sepsis, due to unspecified organism (HCC) [A41.9] Constipation, unspecified constipation type [K59.00]  DISCHARGE DIAGNOSIS:  Active Problems:   Sepsis due to pneumonia [facility])   Colonic ileus   MRSA positive  SECONDARY DIAGNOSIS:   Past Medical History:  Diagnosis Date  . Arthritis   . Background retinopathy   . Cervicalgia   . Chronic kidney disease   . Chronic systolic CHF (congestive heart failure) (HCC)   . Degeneration of lumbar or lumbosacral intervertebral disc   . Dementia   . Diabetes mellitus without complication (HCC)   . DVT (deep venous thrombosis) (HCC)   . Generalized weakness   . Glaucoma   . Hypercholesteremia   . Hypertension   . Long-term insulin use (HCC)   . Peripheral polyneuropathy (HCC)   . Peripheral vascular disease (HCC)   . Spinal stenosis     HOSPITAL COURSE:   1. Sepsis, present on admission: Secondary to pneumonia,     Given levaquin 2 weeks ago and started again now, but pt is not improved now, and procalcitonin is high- so will     broaden the coverage.     give Vanc, positive for MRSA screen.    Give Unasyn for possible aspirations    Checked SLP eval. Started on dysphagia 3 diet as per them.    procalcitonin is coming down now, as a sign of recovery.    Given oral augmentin and doxy on d/c.  2. Acute kidney injury: Resolved 3. Essential hypertension Norvasc Lopressor 4. Dementia: Namenda Aricept 5. Type 2 diabetes insulin requiring continue basal insulin and sliding coverage 6.  Abdominal distension and loose stool   c diff is negative.    Xray shows colonic distension.  Give stool softner, monitor.   Correct potassium.   I spoke to general surgeon and she reviewed the xray-there is no blockage.    Suggested to continue senna and to expect loose stool until the colon empty in next few days.  DISCHARGE CONDITIONS:   Stable.  CONSULTS OBTAINED:    DRUG ALLERGIES:   Allergies  Allergen Reactions  . Fosinopril Other (See Comments)    Reaction: Hyperkalemia     DISCHARGE MEDICATIONS:   Current Discharge Medication List    START taking these medications   Details  amoxicillin-clavulanate (AUGMENTIN) 250-62.5 MG/5ML suspension Take 10 mLs (500 mg total) by mouth 2 (two) times daily. Qty: 150 mL, Refills: 0    bisacodyl (DULCOLAX) 5 MG EC tablet Take 1 tablet (5 mg total) by mouth daily as needed for moderate constipation. Qty: 30 tablet, Refills: 0    doxycycline (VIBRAMYCIN) 50 MG capsule Take 1 capsule (50 mg total) by mouth 2 (two) times daily. Qty: 12 capsule, Refills: 0    HYDROcodone-acetaminophen (NORCO/VICODIN) 5-325 MG tablet Take 1 tablet by mouth every 6 (six) hours as needed for moderate pain. Qty: 20 tablet, Refills: 0    insulin detemir (LEVEMIR) 100 UNIT/ML injection Inject 0.1 mLs (10 Units total) into the skin 2 (two) times daily. Qty: 10 mL, Refills: 11    mupirocin ointment (BACTROBAN) 2 % Place  1 application into the nose 2 (two) times daily. Qty: 22 g, Refills: 0    pantoprazole (PROTONIX) 40 MG tablet Take 1 tablet (40 mg total) by mouth daily. Qty: 30 tablet, Refills: 0    senna (SENOKOT) 8.6 MG TABS tablet Take 1 tablet (8.6 mg total) by mouth daily. Qty: 100 each, Refills: 0      CONTINUE these medications which have CHANGED   Details  amLODipine (NORVASC) 10 MG tablet Take 1 tablet (10 mg total) by mouth daily. Qty: 30 tablet, Refills: 0    potassium chloride SA (K-DUR,KLOR-CON) 20 MEQ tablet Take 1 tablet (20 mEq  total) by mouth 2 (two) times daily. Qty: 60 tablet, Refills: 0      CONTINUE these medications which have NOT CHANGED   Details  acetaminophen (TYLENOL) 325 MG tablet Take 650 mg by mouth every 6 (six) hours as needed for mild pain, moderate pain, fever or headache.    atorvastatin (LIPITOR) 20 MG tablet Take 20 mg by mouth daily.    cholecalciferol (VITAMIN D) 1000 UNITS tablet Take 1,000 Units by mouth daily.    docusate sodium (STOOL SOFTENER) 100 MG capsule Take 100 mg by mouth daily as needed for mild constipation or moderate constipation.     donepezil (ARICEPT) 10 MG tablet Take 10 mg by mouth at bedtime.    DULoxetine (CYMBALTA) 60 MG capsule Take 60 mg by mouth daily.    feeding supplement, ENSURE ENLIVE, (ENSURE ENLIVE) LIQD Take 237 mLs by mouth 2 (two) times daily between meals. Qty: 237 mL, Refills: 12    finasteride (PROSCAR) 5 MG tablet Take 5 mg by mouth daily.    furosemide (LASIX) 20 MG tablet Take 40 mg by mouth daily. Take 2 tablets in the morning. Take an additional tablet 6 hours later.    memantine (NAMENDA XR) 28 MG CP24 24 hr capsule Take 28 mg by mouth daily.    metoprolol tartrate (LOPRESSOR) 25 MG tablet Take 1 tablet (25 mg total) by mouth 2 (two) times daily.    pregabalin (LYRICA) 75 MG capsule Take 1 capsule (75 mg total) by mouth 2 (two) times daily. Qty: 30 capsule, Refills: 1    rivaroxaban (XARELTO) 20 MG TABS tablet Take 1 tablet by mouth daily.    Saw Palmetto-Phytosterols (PROSTATE SR) 160-250 MG CAPS Take 1 capsule by mouth daily.    timolol (TIMOPTIC) 0.5 % ophthalmic solution Place 1 drop into both eyes 2 (two) times daily.    albuterol (PROVENTIL HFA;VENTOLIN HFA) 108 (90 Base) MCG/ACT inhaler Inhale 2 puffs into the lungs every 4 (four) hours as needed for wheezing or shortness of breath.    benzonatate (TESSALON) 200 MG capsule Take 200 mg by mouth 3 (three) times daily as needed for cough.      STOP taking these medications      insulin NPH Human (HUMULIN N,NOVOLIN N) 100 UNIT/ML injection      linagliptin (TRADJENTA) 5 MG TABS tablet      levofloxacin (LEVAQUIN) 250 MG tablet          DISCHARGE INSTRUCTIONS:    Follow with PMD in 1 week.  check potassium in 1 week.  If you experience worsening of your admission symptoms, develop shortness of breath, life threatening emergency, suicidal or homicidal thoughts you must seek medical attention immediately by calling 911 or calling your MD immediately  if symptoms less severe.  You Must read complete instructions/literature along with all the possible adverse reactions/side effects for  all the Medicines you take and that have been prescribed to you. Take any new Medicines after you have completely understood and accept all the possible adverse reactions/side effects.   Please note  You were cared for by a hospitalist during your hospital stay. If you have any questions about your discharge medications or the care you received while you were in the hospital after you are discharged, you can call the unit and asked to speak with the hospitalist on call if the hospitalist that took care of you is not available. Once you are discharged, your primary care physician will handle any further medical issues. Please note that NO REFILLS for any discharge medications will be authorized once you are discharged, as it is imperative that you return to your primary care physician (or establish a relationship with a primary care physician if you do not have one) for your aftercare needs so that they can reassess your need for medications and monitor your lab values.    Today   CHIEF COMPLAINT:   Chief Complaint  Patient presents with  . Code Sepsis    HISTORY OF PRESENT ILLNESS:  William Byrd  is a 82 y.o. male with a known history of CHF, hypertension, hyperlipidemia, CK D stage III, DVT, dementia was in a usual state of health until this afternoon. He essentially  emergency department from his nursing home secondary to vomiting. Patient was admitted here on 10/17/2015 with diagnosis of acute hypoxic respiratory failure secondary to pneumonia, community-acquired  On my questioning patient offers no complaints at this time.    VITAL SIGNS:  Blood pressure (!) 175/79, pulse 85, temperature 98.6 F (37 C), temperature source Oral, resp. rate 16, height 5\' 10"  (1.778 m), weight 79.9 kg (176 lb 3.2 oz), SpO2 93 %.  I/O:   Intake/Output Summary (Last 24 hours) at 11/15/15 1248 Last data filed at 11/15/15 1102  Gross per 24 hour  Intake              890 ml  Output                0 ml  Net              890 ml    PHYSICAL EXAMINATION:   GENERAL:82 y.o.male currently in no acute distress.  HEAD: Normocephalic, atraumatic.  EYES: Pupils equal, round, reactive to light. Extraocular muscles intact. No scleral icterus.  MOUTH: Moist mucosal membrane. Dentition intact. No abscess noted.  EAR, NOSE, THROAT: Clear without exudates. No external lesions.  NECK: Supple. No thyromegaly. No nodules. No JVD.  PULMONARY: Diminished breath sounds with scattered rhonchi on the left without wheeze . No use of accessory muscles, Good respiratory effort. good air entry bilaterally CHEST: Nontender to palpation.  CARDIOVASCULAR: S1 and S2. Regular rate and rhythm. No murmurs, rubs, or gallops. No edema. Pedal pulses 2+ bilaterally.  GASTROINTESTINAL: Soft, nontender,  distention. No masses. Positive bowel sounds. No hepatosplenomegaly.  MUSCULOSKELETAL: No swelling, clubbing, or edema. Range of motion full in all extremities.  NEUROLOGIC: Cranial nerves II through XII are intact. No gross focal neurological deficits. Sensation intact. Reflexes intact.  SKIN: No ulceration, lesions, rashes, or cyanosis. Skin warm and dry. Turgor intact.  PSYCHIATRIC: Mood, affect within normal limits. The patient is awake, alert and oriented x self. Insight, judgment poor.   DATA REVIEW:    CBC  Recent Labs Lab 11/15/15 0541  WBC 5.6  HGB 10.0*  HCT 28.6*  PLT 254  Chemistries   Recent Labs Lab 11/10/15 0326  11/13/15 0426  11/15/15 0541  NA 139  < > 139  < > 139  K 3.7  < > 3.2*  < > 3.7  CL 106  < > 106  < > 106  CO2 27  < > 26  < > 26  GLUCOSE 140*  < > 100*  < > 166*  BUN 24*  < > 23*  < > 28*  CREATININE 1.91*  < > 1.61*  < > 1.72*  CALCIUM 8.5*  < > 9.0  < > 9.0  MG  --   --  1.9  --   --   AST 18  --   --   --   --   ALT 12*  --   --   --   --   ALKPHOS 67  --   --   --   --   BILITOT 0.7  --   --   --   --   < > = values in this interval not displayed.  Cardiac Enzymes  Recent Labs Lab 11/09/15 1549  TROPONINI <0.03    Microbiology Results  Results for orders placed or performed during the hospital encounter of 11/09/15  Blood Culture (routine x 2)     Status: None   Collection Time: 11/09/15  3:49 PM  Result Value Ref Range Status   Specimen Description BLOOD LEFT WRIST  Final   Special Requests BOTTLES DRAWN AEROBIC AND ANAEROBIC 7CC  Final   Culture NO GROWTH 5 DAYS  Final   Report Status 11/14/2015 FINAL  Final  Blood Culture (routine x 2)     Status: None   Collection Time: 11/09/15  3:50 PM  Result Value Ref Range Status   Specimen Description BLOOD RIGHT HAND  Final   Special Requests BOTTLES DRAWN AEROBIC AND ANAEROBIC 5CC  Final   Culture NO GROWTH 5 DAYS  Final   Report Status 11/14/2015 FINAL  Final  Urine culture     Status: None   Collection Time: 11/09/15  3:50 PM  Result Value Ref Range Status   Specimen Description URINE, RANDOM  Final   Special Requests NONE  Final   Culture NO GROWTH [facility]    Final   Report Status 11/10/2015 FINAL  Final  MRSA PCR Screening     Status: Abnormal   Collection Time: 11/12/15  5:48 PM  Result Value Ref Range Status   MRSA by PCR POSITIVE (A) NEGATIVE Final    Comment:        The GeneXpert MRSA Assay (FDA approved for NASAL specimens only), is  one component of a comprehensive MRSA colonization surveillance program. It is not intended to diagnose MRSA infection nor to guide or monitor treatment for MRSA infections. CRITICAL RESULT CALLED TO, READ BACK BY AND VERIFIED WITH: KELLY HUBACHECK ON 11/12/15 AT 1936 [facility]   C difficile quick scan w PCR reflex     Status: None   Collection Time: 11/14/15  1:52 AM  Result Value Ref Range Status   C Diff antigen NEGATIVE NEGATIVE Final   C Diff toxin NEGATIVE NEGATIVE Final   C Diff interpretation No C. difficile detected.  Final    Comment: Negative for C. difficile    RADIOLOGY:  Dg Abd 1 View  Result Date: 11/15/2015 CLINICAL DATA:  Nausea, vomiting and shortness of breath. Distended abdomen. Weakness. EXAM: ABDOMEN - 1 VIEW COMPARISON:  11/14/2015  FINDINGS: Small bowel gas pattern is normal. The patient continues step gaseous distention of the colon, most consistent with ileus/ pseudo obstruction. Amount of intestinal gas is similar to yesterday. IMPRESSION: Persistent large amount of gas throughout the colon consistent with ileus or pseudo obstruction. Electronically Signed   By: Paulina Fusi M.D.   On: 11/15/2015 10:07   Dg Abd 1 View  Result Date: 11/14/2015 CLINICAL DATA:  Abdominal distention EXAM: ABDOMEN - 1 VIEW COMPARISON:  CT abdomen and pelvis November 09, 2015 FINDINGS: There is dilatation of the transverse colon with air. Maximum measured diameter of the transverse colon is 9.3 cm. There is no appreciable small bowel dilatation or air-fluid level. No free air evident on this supine examination. There are foci of arterial vascular calcification in the pelvis. There is degenerative change in the lumbar spine. IMPRESSION: Findings indicative of a degree of colonic ileus. No bowel obstruction or free air evident on this supine examination. 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 Electronically Signed   By: Bretta Bang III M.D.   On:  11/14/2015 13:24    EKG:   Orders placed or performed during the hospital encounter of 11/09/15  . ED EKG 12-Lead  . ED EKG 12-Lead  . EKG 12-Lead  . EKG 12-Lead  . EKG 12-Lead  . EKG 12-Lead    Management plans discussed with the patient, family and they are in agreement.  CODE STATUS:     Code Status Orders        Start     Ordered   11/10/15 0025  Full code  Continuous     11/10/15 0025    Code Status History    Date Active Date Inactive Code Status Order ID Comments User Context   10/17/2015  8:18 PM 10/19/2015  3:09 PM Full Code [id number]  Houston Siren, MD Inpatient   07/15/2015  8:30 AM 07/18/2015  9:00 PM Full Code [id number]  Auburn Bilberry, MD ED   12/28/2014  9:50 PM 12/31/2014  4:38 PM Full Code [id number]  Altamese Dilling, MD Inpatient   09/05/2014  4:09 AM 09/08/2014  6:11 PM Full Code [id number]  Crissie Figures, MD ED    Advance Directive Documentation   Flowsheet Row Most Recent Value  Type of Advance Directive  Healthcare Power of Attorney  Pre-existing out of facility DNR order (yellow form or pink MOST form)  No data  "MOST" Form in Place?  No data      TOTAL TIME TAKING CARE OF THIS PATIENT: 35 minutes.    Altamese Dilling M.D on 11/15/2015 at 12:48 PM  Between 7am to 6pm - Pager - [phone]  After 6pm go to [email or web address] - password [employer]  [facility]  Office  [phone]  CC: Primary care physician; Marisue Ivan, MD   Note: This dictation was prepared with Dragon dictation along with smaller phrase technology. Any transcriptional errors that result from this process are unintentional.

## 2015-11-15 NOTE — Clinical Social Work Placement (Signed)
   CLINICAL SOCIAL WORK PLACEMENT  NOTE  Date:  11/15/2015  Patient Details  Name: William Byrd MRN: [id number] Date of Birth: 07/20/1931  Clinical Social Work is seeking post-discharge placement for this patient at the Skilled  Nursing Facility level of care (*CSW will initial, date and re-position this form in  chart as items are completed):  Yes   Patient/family provided with [place] Clinical Social Work Department's list of facilities offering this level of care within the geographic area requested by the patient (or if unable, by the patient's family).  Yes   Patient/family informed of their freedom to choose among providers that offer the needed level of care, that participate in Medicare, Medicaid or managed care program needed by the patient, have an available bed and are willing to accept the patient.  Yes   Patient/family informed of [facility]'s ownership interest in [facility] and [facility], as well as of the fact that they are under no obligation to receive care at these facilities.  PASRR submitted to EDS on  ([facility] was down on 11/12/15 )     PASRR number received on       Existing PASRR number confirmed on 11/13/15     FL2 transmitted to all facilities in geographic area requested by pt/family on 11/12/15     FL2 transmitted to all facilities within larger geographic area on       Patient informed that his/her managed care company has contracts with or will negotiate with certain facilities, including the following:        Yes   Patient/family informed of bed offers received.  Patient chooses bed at  [facility]([place])     Physician recommends and patient chooses bed at      Patient to be transferred to  [facility]([place]) on 11/15/15.  Patient to be transferred to facility by  [facility])     Patient family notified on 11/15/15 of transfer.  Name of family member notified:   (Patient's wife was at bedside and aware of discharge. )      PHYSICIAN       Additional Comment:    _______________________________________________ Ralene BatheMackenzie Ellen Goris, Student-Social Work 11/15/2015, 2:09 PM

## 2015-11-15 NOTE — Progress Notes (Signed)
[facility] - [place] at [facility]   PATIENT NAME: William Byrd    MRN#:  [id number]  DATE OF BIRTH:  [birth year]  SUBJECTIVE:  Hospital Day: 6 days William Byrd is a 82 y.o. male presenting with . Nausea vomiting shortness of breath  Overnight events: No acute overnight events Interval Events: No complaints at this time, currently on room air-Had bowel movement , abdomen is distended. Still very weak.  had episode of loose stool 3-4 episodes in last 24 hours.  he also have abdominal distension, No fever, C diff negative.  REVIEW OF SYSTEMS:  Unable to accurately obtain given dementia  DRUG ALLERGIES:   Allergies  Allergen Reactions  . Fosinopril Other (See Comments)    Reaction: Hyperkalemia     VITALS:  Blood pressure (!) 175/79, pulse 85, temperature 98.6 F (37 C), temperature source Oral, resp. rate 16, height 5\' 10"  (1.778 m), weight 79.9 kg (176 lb 3.2 oz), SpO2 93 %.  PHYSICAL EXAMINATION:  VITAL SIGNS: Vitals:   11/15/15 0424 11/15/15 0739  BP: (!) 179/77 (!) 175/79  Pulse: 82 85  Resp: 16 16  Temp: 97.9 F (36.6 C) 98.6 F (37 C)   GENERAL:82 y.o.male currently in no acute distress.  HEAD: Normocephalic, atraumatic.  EYES: Pupils equal, round, reactive to light. Extraocular muscles intact. No scleral icterus.  MOUTH: Moist mucosal membrane. Dentition intact. No abscess noted.  EAR, NOSE, THROAT: Clear without exudates. No external lesions.  NECK: Supple. No thyromegaly. No nodules. No JVD.  PULMONARY: Diminished breath sounds with scattered rhonchi on the left without wheeze . No use of accessory muscles, Good respiratory effort. good air entry bilaterally CHEST: Nontender to palpation.  CARDIOVASCULAR: S1 and S2. Regular rate and rhythm. No murmurs, rubs, or gallops. No edema. Pedal pulses 2+ bilaterally.  GASTROINTESTINAL: Soft, nontender,  distention. No masses. Positive bowel sounds. No hepatosplenomegaly.  MUSCULOSKELETAL:  No swelling, clubbing, or edema. Range of motion full in all extremities.  NEUROLOGIC: Cranial nerves II through XII are intact. No gross focal neurological deficits. Sensation intact. Reflexes intact.  SKIN: No ulceration, lesions, rashes, or cyanosis. Skin warm and dry. Turgor intact.  PSYCHIATRIC: Mood, affect within normal limits. The patient is awake, alert and oriented x self. Insight, judgment poor.    LABORATORY PANEL:   CBC  Recent Labs Lab 11/15/15 0541  WBC 5.6  HGB 10.0*  HCT 28.6*  PLT 254   ------------------------------------------------------------------------------------------------------------------  Chemistries   Recent Labs Lab 11/10/15 0326  11/13/15 0426 11/14/15 0410  NA 139  < > 139 141  K 3.7  < > 3.2* 3.4*  CL 106  < > 106 108  CO2 27  < > 26 28  GLUCOSE 140*  < > 100* 51*  BUN 24*  < > 23* 32*  CREATININE 1.91*  < > 1.61* 1.72*  CALCIUM 8.5*  < > 9.0 9.0  MG  --   --  1.9  --   AST 18  --   --   --   ALT 12*  --   --   --   ALKPHOS 67  --   --   --   BILITOT 0.7  --   --   --   < > = values in this interval not displayed. ------------------------------------------------------------------------------------------------------------------  Cardiac Enzymes  Recent Labs Lab 11/09/15 1549  TROPONINI <0.03   ------------------------------------------------------------------------------------------------------------------  RADIOLOGY:  Dg Abd 1 View  Result Date: 11/14/2015 CLINICAL DATA:  Abdominal distention EXAM:  ABDOMEN - 1 VIEW COMPARISON:  CT abdomen and pelvis November 09, 2015 FINDINGS: There is dilatation of the transverse colon with air. Maximum measured diameter of the transverse colon is 9.3 cm. There is no appreciable small bowel dilatation or air-fluid level. No free air evident on this supine examination. There are foci of arterial vascular calcification in the pelvis. There is degenerative change in the lumbar spine. IMPRESSION:  Findings indicative of a degree of colonic ileus. No bowel obstruction or free air evident on this supine examination. 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 Electronically Signed   By: Bretta BangWilliam  Woodruff III M.D.   On: 11/14/2015 13:24    EKG:   Orders placed or performed during the hospital encounter of 11/09/15  . ED EKG 12-Lead  . ED EKG 12-Lead  . EKG 12-Lead  . EKG 12-Lead  . EKG 12-Lead  . EKG 12-Lead    ASSESSMENT AND PLAN:   William Byrd is a 82 y.o. male presenting with Code Sepsis . Admitted 11/09/2015 : Day #: 6 days   1. Sepsis, present on admission: Secondary to pneumonia,     Given levaquin 2 weeks ago and started again now, but pt is not improved now, and procalcitonin is high- so will     broaden the coverage.     give Vanc, positive for MRSA screen.    Give Unasyn for possible aspirations    Checked SLP eval. Started on dysphagia 3 diet as per them. 2. Acute kidney injury: Resolved 3. Essential hypertension Norvasc Lopressor 4. Dementia: Namenda Aricept 5. Type 2 diabetes insulin requiring continue basal insulin and sliding coverage 6. Abdominal distension and loose stool   c diff is negative.    Xray shows colonic distension.  Give stool softner, monitor.   Correct potassium.  Disposition: SNF placement  All the records are reviewed and case discussed with Care Management/Social Workerr. Management plans discussed with the patient, family and they are in agreement.  CODE STATUS:  full TOTAL TIME TAKING CARE OF THIS PATIENT: 35 minutes.   POSSIBLE D/C IN 1-2 DAYS, DEPENDING ON CLINICAL CONDITION.  Altamese DillingVACHHANI, William Byrd M.D on 11/15/2015 at 9:22 AM  Between 7am to 6pm - Pager - [phone]  After 6pm: House Pager: - [phone]  [facility]  Office  [phone]  CC: Primary care physician; William Byrd, [name], MD

## 2015-11-15 NOTE — Progress Notes (Addendum)
Pharmacy Antibiotic Note  William Byrd is a 82 y.o. male admitted on 11/09/2015 with pneumonia.  Pharmacy has been consulted for Vancomycin and Unasyn dosing. Patient from nursing home with PNA. Patient was admitted 10/17/15 for acute resp.failure and was on Levaquin.  ? HCAP/Aspiration PNA.  Plan: Unasyn 3 gm IV q6h.  Vancomycin 1250 IV every 24 hours.  Goal trough 15-20 mcg/mL.  Will give stacked dosing and check trough prior to 4th dose on 10/5. Monitor renal fxn. MRSA PCR has been ordered.  Ke 0.033  T1/2  21 hrs   Vd 55.9.     Height: 5\' 10"  (177.8 cm) Weight: 176 lb 3.2 oz (79.9 kg) IBW/kg (Calculated) : 73  Temp (24hrs), Avg:98.4 F (36.9 C), Min:97.9 F (36.6 C), Max:98.8 F (37.1 C)   Recent Labs Lab 11/09/15 1549 11/09/15 1841 11/10/15 0326 11/11/15 1135 11/12/15 0324 11/13/15 0426 11/14/15 0410 11/15/15 0541  WBC 13.7*  --  10.5 6.1  --   --   --  5.6  CREATININE 2.05*  --  1.91*  --  1.68* 1.61* 1.72*  --   LATICACIDVEN 2.3* 1.5  --   --   --   --   --   --   VANCOTROUGH  --   --   --   --   --   --   --  14*    Estimated Creatinine Clearance: 34.2 mL/min (by C-G formula based on SCr of 1.72 mg/dL (H)).    Allergies  Allergen Reactions  . Fosinopril Other (See Comments)    Reaction: Hyperkalemia     Antimicrobials this admission: Vanc 9/29 >> once Pip/tazo 9-29 >> once Levofloxacin 9/29 >>  10/2  Vancomycin 10/2 >> Unasyn 10/2 >>  Dose adjustments this admission:   10/5 AM vanc level 14. Changed to 1250 mg q 18 hours. Level before 4th new dose. May need to check earlier if SCr continues to trend upward.  Microbiology results:   BCx:     UCx:      Sputum:    10/2 MRSA PCR: (+)  Thank you for allowing pharmacy to be a part of this patient's care.  William Byrd S 11/15/2015 6:54 AM

## 2015-11-22 LAB — POTASSIUM: Potassium: 4.8 mmol/L (ref 3.5–5.1)

## 2015-12-03 ENCOUNTER — Non-Acute Institutional Stay (SKILLED_NURSING_FACILITY): Payer: Medicare Other | Admitting: Gerontology

## 2015-12-03 DIAGNOSIS — A419 Sepsis, unspecified organism: Secondary | ICD-10-CM | POA: Diagnosis not present

## 2015-12-03 DIAGNOSIS — J189 Pneumonia, unspecified organism: Secondary | ICD-10-CM | POA: Diagnosis not present

## 2015-12-03 NOTE — Progress Notes (Signed)
Location:      Place of Service:  SNF (31)  Provider: Lorenso Quarry, NP-C  PCP: Marisue Ivan, MD Patient Care Team: Marisue Ivan, MD as PCP - General (Family Medicine)  Extended Emergency Contact Information Primary Emergency Contact: [name] Address: [phone]          [place], [place] [address] [place] Home Phone: [phone] Relation: Spouse  Code Status: Full Goals of care:  Advanced Directive information Advanced Directives 11/10/2015  Does patient have an advance directive? Yes  Type of Advance Directive Healthcare Power of Attorney  Does patient want to make changes to advanced directive? No - Patient declined  Copy of advanced directive(s) in chart? No - copy requested  Would patient like information on creating an advanced directive? -     Allergies  Allergen Reactions  . Fosinopril Other (See Comments)    Reaction: Hyperkalemia     Chief Complaint  Patient presents with  . Discharge Note    HPI:  82 y.o. male was seen today for discharge evaluation. Patient was admitted to the facility for rehabilitation following hospitalization at [facility] for the sepsis related to pneumonia. Patient was followed by PT and OT while in the rehabilitation facility for generalized weakness and deconditioning. Patient completed antibiotics. Patient does endorse weakness but feels he is ready to go home. Wife feels she is able to care for him at home with the home health services. Patient continues with weakness, but wife will care for him with the assistance of wheelchair. Patient has been afebrile, O2 sats 100%. No complaints of nausea, vomiting, diarrhea, fever, chills, chest pain, shortness of breath, headache, abdominal pain, dizziness. Denies difficulty swallowing. Incontinent of bowel and bladder at baseline. Vital signs stable. No other complaints. Unable to obtain first and review of systems from patient due to dementia. Wife answered the  questions and provided the ROS.    Past Medical History:  Diagnosis Date  . Arthritis   . Background retinopathy   . Cervicalgia   . Chronic kidney disease   . Chronic systolic CHF (congestive heart failure) (HCC)   . Degeneration of lumbar or lumbosacral intervertebral disc   . Dementia   . Diabetes mellitus without complication (HCC)   . DVT (deep venous thrombosis) (HCC)   . Generalized weakness   . Glaucoma   . Hypercholesteremia   . Hypertension   . Long-term insulin use (HCC)   . Peripheral polyneuropathy (HCC)   . Peripheral vascular disease (HCC)   . Spinal stenosis     Past Surgical History:  Procedure Laterality Date  . c-spine fusion N/A       reports that he has quit smoking. He has a 15.00 pack-year smoking history. He has never used smokeless tobacco. He reports that he does not drink alcohol or use drugs. Social History   Social History  . Marital status: Married    Spouse name: N/A  . Number of children: N/A  . Years of education: N/A   Occupational History  . Not on file.   Social History Main Topics  . Smoking status: Former Smoker    Packs/day: 0.50    Years: 30.00  . Smokeless tobacco: Never Used  . Alcohol use No  . Drug use: No  . Sexual activity: No   Other Topics Concern  . Not on file   Social History Narrative  . No narrative on file   Functional Status Survey:    Allergies  Allergen Reactions  . Fosinopril Other (See Comments)    Reaction: Hyperkalemia     Pertinent  Health Maintenance Due  Topic Date Due  . FOOT EXAM  07/16/1941  . OPHTHALMOLOGY EXAM  07/16/1941  . URINE MICROALBUMIN  07/16/1941  . PNA vac Low Risk Adult (1 of 2 - PCV13) 07/16/1998  . HEMOGLOBIN A1C  03/08/2015  . INFLUENZA VACCINE  Completed    Medications:   Medication List       Accurate as of 12/03/15  2:12 PM. Always use your most recent med list.          acetaminophen 325 MG tablet Commonly known as:  TYLENOL Take 650 mg by  mouth every 6 (six) hours as needed for mild pain, moderate pain, fever or headache.   albuterol 108 (90 Base) MCG/ACT inhaler Commonly known as:  PROVENTIL HFA;VENTOLIN HFA Inhale 2 puffs into the lungs every 4 (four) hours as needed for wheezing or shortness of breath.   amLODipine 10 MG tablet Commonly known as:  NORVASC Take 1 tablet (10 mg total) by mouth daily.   atorvastatin 20 MG tablet Commonly known as:  LIPITOR Take 20 mg by mouth daily.   benzonatate 200 MG capsule Commonly known as:  TESSALON Take 200 mg by mouth 3 (three) times daily as needed for cough.   bisacodyl 5 MG EC tablet Commonly known as:  DULCOLAX Take 1 tablet (5 mg total) by mouth daily as needed for moderate constipation.   cholecalciferol 1000 units tablet Commonly known as:  VITAMIN D Take 1,000 Units by mouth daily.   donepezil 10 MG tablet Commonly known as:  ARICEPT Take 10 mg by mouth at bedtime.   DULoxetine 60 MG capsule Commonly known as:  CYMBALTA Take 60 mg by mouth daily.   feeding supplement (ENSURE ENLIVE) Liqd Take 237 mLs by mouth 2 (two) times daily between meals.   finasteride 5 MG tablet Commonly known as:  PROSCAR Take 5 mg by mouth daily.   furosemide 20 MG tablet Commonly known as:  LASIX Take 40 mg by mouth daily. Take 2 tablets in the morning. Take an additional tablet 6 hours later.   HYDROcodone-acetaminophen 5-325 MG tablet Commonly known as:  NORCO/VICODIN Take 1 tablet by mouth every 6 (six) hours as needed for moderate pain.   insulin detemir 100 UNIT/ML injection Commonly known as:  LEVEMIR Inject 0.1 mLs (10 Units total) into the skin 2 (two) times daily.   metoprolol tartrate 25 MG tablet Commonly known as:  LOPRESSOR Take 1 tablet (25 mg total) by mouth 2 (two) times daily.   NAMENDA XR 28 MG Cp24 24 hr capsule Generic drug:  memantine Take 28 mg by mouth daily.   pantoprazole 40 MG tablet Commonly known as:  PROTONIX Take 1 tablet (40 mg  total) by mouth daily.   potassium chloride SA 20 MEQ tablet Commonly known as:  K-DUR,KLOR-CON Take 1 tablet (20 mEq total) by mouth 2 (two) times daily.   pregabalin 75 MG capsule Commonly known as:  LYRICA Take 1 capsule (75 mg total) by mouth 2 (two) times daily.   PROSTATE SR 160-250 MG Caps Take 1 capsule by mouth daily.   senna 8.6 MG Tabs tablet Commonly known as:  SENOKOT Take 1 tablet (8.6 mg total) by mouth daily.   STOOL SOFTENER 100 MG capsule Generic drug:  docusate sodium Take 100 mg by mouth daily as needed for mild constipation or moderate constipation.   timolol 0.5 %  ophthalmic solution Commonly known as:  TIMOPTIC Place 1 drop into both eyes 2 (two) times daily.   XARELTO 20 MG Tabs tablet Generic drug:  rivaroxaban Take 1 tablet by mouth daily.       Review of Systems  Unable to perform ROS: Dementia  Constitutional: Negative for activity change, appetite change, chills, diaphoresis and fever.  HENT: Negative for congestion, sneezing, sore throat, trouble swallowing and voice change.   Eyes: Negative.   Respiratory: Negative for apnea, cough, choking, chest tightness, shortness of breath and wheezing.   Cardiovascular: Negative for chest pain, palpitations and leg swelling.  Gastrointestinal: Negative for abdominal distention, abdominal pain, constipation, diarrhea and nausea.  Genitourinary: Negative for difficulty urinating, dysuria, frequency and urgency.  Musculoskeletal: Negative for back pain, gait problem and myalgias. Arthralgias: typical arthritis.  Skin: Negative for color change, pallor, rash and wound.  Neurological: Positive for weakness. Negative for dizziness, tremors, syncope, speech difficulty, numbness and headaches.  Psychiatric/Behavioral: Negative for agitation and behavioral problems.  All other systems reviewed and are negative.   Vitals:   12/03/15 0600  BP: (!) 144/64  Pulse: 64  Resp: 14  Temp: 98.2 F (36.8 C)    SpO2: 100%  Weight: 162 lb 9.6 oz (73.8 kg)   Body mass index is 23.33 kg/m. Physical Exam  Constitutional: Vital signs are normal. He appears well-developed and well-nourished. He is active and cooperative. He does not appear ill. No distress.  HENT:  Head: Normocephalic and atraumatic.  Mouth/Throat: Uvula is midline, oropharynx is clear and moist and mucous membranes are normal. Mucous membranes are not pale, not dry and not cyanotic.  Eyes: Conjunctivae, EOM and lids are normal. Pupils are equal, round, and reactive to light.  Neck: Trachea normal, normal range of motion and full passive range of motion without pain. Neck supple. No JVD present. No tracheal deviation, no edema and no erythema present. No thyromegaly present.  Cardiovascular: Normal rate, regular rhythm, normal heart sounds, intact distal pulses and normal pulses.  Exam reveals no gallop, no distant heart sounds and no friction rub.   No murmur heard. Pulmonary/Chest: Effort normal. No accessory muscle usage. No respiratory distress. He has decreased breath sounds in the right lower field and the left lower field. He has no wheezes. He has no rhonchi. He has no rales. He exhibits no tenderness.  Abdominal: Normal appearance and bowel sounds are normal. He exhibits no distension and no ascites. There is no tenderness.  Musculoskeletal: Normal range of motion. He exhibits no edema or tenderness.  Expected osteoarthritis, stiffness; generalized weakness and deconditioning  Neurological: He is alert. He is disoriented (dementia, oriented to person and place).  Skin: Skin is warm, dry and intact. No rash noted. He is not diaphoretic. No cyanosis or erythema. No pallor. Nails show no clubbing.  Psychiatric: He has a normal mood and affect. His speech is normal and behavior is normal. Judgment and thought content normal. Cognition and memory are normal.  Nursing note and vitals reviewed.   Labs reviewed: Basic Metabolic  Panel:  Recent Labs  10/17/15 1434  11/13/15 0426 11/14/15 0410 11/15/15 0541 11/22/15 0505  NA 135  < > 139 141 139  --   K 4.4  < > 3.2* 3.4* 3.7 4.8  CL 99*  < > 106 108 106  --   CO2 26  < > 26 28 26   --   GLUCOSE 124*  < > 100* 51* 166*  --   BUN 30*  < >  23* 32* 28*  --   CREATININE 2.17*  < > 1.61* 1.72* 1.72*  --   CALCIUM 8.9  < > 9.0 9.0 9.0  --   MG 2.0  --  1.9  --   --   --   < > = values in this interval not displayed. Liver Function Tests:  Recent Labs  10/17/15 1434 11/09/15 1549 11/10/15 0326  AST 40 25 18  ALT 15* 14* 12*  ALKPHOS 74 86 67  BILITOT 0.5 0.7 0.7  PROT 8.1 8.5* 7.1  ALBUMIN 3.2* 3.4* 2.7*    Recent Labs  11/09/15 1549  LIPASE 21   No results for input(s): AMMONIA in the last 8760 hours. CBC:  Recent Labs  07/15/15 0459  10/17/15 1434  11/09/15 1549 11/10/15 0326 11/11/15 1135 11/15/15 0541  WBC 8.6  < > 10.9*  < > 13.7* 10.5 6.1 5.6  NEUTROABS 7.2*  --  9.4*  --  11.9*  --   --   --   HGB 10.4*  < > 11.5*  < > 11.7* 9.6* 9.9* 10.0*  HCT 30.0*  < > 33.5*  < > 34.3* 28.3* 28.7* 28.6*  MCV 90.5  < > 92.0  < > 90.1 91.1 89.2 89.0  PLT 241  < > 276  < > 275 246 234 254  < > = values in this interval not displayed. Cardiac Enzymes:  Recent Labs  12/28/14 1042 10/17/15 1434 11/09/15 1549  TROPONINI <0.03 <0.03 <0.03   BNP: Invalid input(s): POCBNP CBG:  Recent Labs  11/14/15 2139 11/15/15 0735 11/15/15 1119  GLUCAP 191* 145* 258*    Procedures and Imaging Studies During Stay: Ct Abdomen Pelvis Wo Contrast  Result Date: 11/09/2015 CLINICAL DATA:  82 year old male with history of vomiting today. Generalized malaise. Abdominal distention. EXAM: CT ABDOMEN AND PELVIS WITHOUT CONTRAST TECHNIQUE: Multidetector CT imaging of the abdomen and pelvis was performed following the standard protocol without IV contrast. COMPARISON:  No priors. FINDINGS: Lower chest: Atherosclerotic calcifications in the left circumflex and  right coronary arteries. Extensive peribronchovascular airspace disease in the left lower lobe, concerning for sequela of recent aspiration. Hepatobiliary: The numerous calcified granulomas are noted throughout the liver. No discrete cystic or solid hepatic lesions are noted. Unenhanced appearance of the gallbladder is normal. Pancreas: No pancreatic mass or peripancreatic inflammatory changes on today's noncontrast CT examination. Spleen: Innumerable calcified granulomas are noted throughout the spleen. Adrenals/Urinary Tract: Nonobstructive calculi are noted within the lower pole collecting systems of the kidneys bilaterally, measuring up to 4 mm on the left side. No calculi are noted along the course of either ureter or within the lumen of the urinary bladder. There is no hydroureteronephrosis. Unenhanced appearance of the kidneys is otherwise unremarkable. Unenhanced appearance of the urinary bladder is normal. Bilateral adrenal glands are normal in appearance. Stomach/Bowel: Unenhanced appearance of the stomach is normal. There is no pathologic dilatation of small bowel or colon. The appendix is not confidently identified and may be surgically absent. Regardless, there are no inflammatory changes noted adjacent to the cecum to suggest the presence of an acute appendicitis at this time. Large volume of stool in the rectal vault. Vascular/Lymphatic: Aortic atherosclerosis, without evidence of aneurysm in the abdominal or pelvic vasculature. No lymphadenopathy noted in the abdomen or pelvis. Reproductive: Prostate gland and seminal vesicles are unremarkable in appearance. Other: No significant volume of ascites.  No pneumoperitoneum. Musculoskeletal: There are no aggressive appearing lytic or blastic lesions noted in the  visualized portions of the skeleton. IMPRESSION: 1. Findings in the left lower lobe, concerning for either sequela of recent aspiration or developing bronchopneumonia. 2. No acute findings in the  abdomen or pelvis. 3. Nonobstructive calculi are noted within the collecting systems of the kidneys bilaterally measuring up to 4 mm in the lower pole collecting system of left kidney. However, there are no ureteral stones or findings to suggest urinary tract obstruction at this time. 4. Aortic atherosclerosis, in addition to at least 2 vessel coronary artery disease. 5. Large volume of stool in the rectal vault may suggest constipation. 6. Sequela of old granulomatous disease, as above. Electronically Signed   By: Trudie Reed M.D.   On: 11/09/2015 19:44   Dg Chest 2 View  Result Date: 11/13/2015 CLINICAL DATA:  Nausea and vomiting since yesterday. History of CHF, diabetes, hypertension, chronic renal insufficiency, former smoker. EXAM: CHEST  2 VIEW COMPARISON:  Portable chest x-ray of November 09, 2015 FINDINGS: The lungs are less well inflated today. The interstitial markings are mildly increased. There is linear increased density at the lung bases greatest on the right. The heart is top-normal in size. The pulmonary vascularity is not engorged. There is calcification in the wall of the aortic arch. The observed bony thorax is unremarkable. Numerous calcifications project in the left upper quadrant of the abdomen likely in the spleen. IMPRESSION: Mild hypoinflation with bibasilar atelectasis. No CHF. Previous granulomatous infection of the spleen Aortic atherosclerosis. Electronically Signed   By: David  [place] M.D.   On: 11/13/2015 07:33   Dg Abd 1 View  Result Date: 11/15/2015 CLINICAL DATA:  Nausea, vomiting and shortness of breath. Distended abdomen. Weakness. EXAM: ABDOMEN - 1 VIEW COMPARISON:  11/14/2015 FINDINGS: Small bowel gas pattern is normal. The patient continues step gaseous distention of the colon, most consistent with ileus/ pseudo obstruction. Amount of intestinal gas is similar to yesterday. IMPRESSION: Persistent large amount of gas throughout the colon consistent with ileus or  pseudo obstruction. Electronically Signed   By: Paulina Fusi M.D.   On: 11/15/2015 10:07   Dg Abd 1 View  Result Date: 11/14/2015 CLINICAL DATA:  Abdominal distention EXAM: ABDOMEN - 1 VIEW COMPARISON:  CT abdomen and pelvis November 09, 2015 FINDINGS: There is dilatation of the transverse colon with air. Maximum measured diameter of the transverse colon is 9.3 cm. There is no appreciable small bowel dilatation or air-fluid level. No free air evident on this supine examination. There are foci of arterial vascular calcification in the pelvis. There is degenerative change in the lumbar spine. IMPRESSION: Findings indicative of a degree of colonic ileus. No bowel obstruction or free air evident on this supine examination. 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 Electronically Signed   By: Bretta Bang III M.D.   On: 11/14/2015 13:24   Dg Chest Port 1 View  Result Date: 11/09/2015 CLINICAL DATA:  New onset fever today.  History of pneumonia. EXAM: PORTABLE CHEST 1 VIEW COMPARISON:  PA and lateral chest 10/17/2015 and 07/15/2015. CT chest 12/28/2014. FINDINGS: Heart size is upper normal. Lungs are clear. No pneumothorax or pleural effusion. Splenic calcifications noted and unchanged. IMPRESSION: No acute disease. Electronically Signed   By: Drusilla Kanner M.D.   On: 11/09/2015 16:14    Assessment/Plan:   1. Sepsis due to pneumonia [facility])  Continue physical therapy at home  Continue occupational therapy at home  Follow up with PCP ASAP after discharge  Patient is being discharged with the  following home health services:  Home health services, PT/OT/skilled nursing/aid  Patient is being discharged with the following durable medical equipment:  Wheelchair  Patient has been advised to f/u with their PCP in 1-2 weeks to bring them up to date on their rehab stay.  Social services at facility was responsible for arranging this appointment.  Pt was provided  with a 30 day supply of prescriptions for medications and refills must be obtained from their PCP.  For controlled substances, a more limited supply may be provided adequate until PCP appointment only.  Future labs/tests needed:  Per PCP  Family/ staff Communication:   Total Time:  Documentation:  Face to Face:  Family/Phone: Wife present at bedside  Brynda Rim, NP-C Geriatrics [facility] [phone], [place] [address] Cell Phone (Mon-Fri 8am-5pm):  [phone] On Call:  [phone] & follow prompts after 5pm & weekends Office Phone:  [phone] Office Fax:  [phone]

## 2016-04-05 ENCOUNTER — Inpatient Hospital Stay
Admission: EM | Admit: 2016-04-05 | Discharge: 2016-04-08 | DRG: 871 | Disposition: A | Discharge status: Skilled Nursing Facility | Payer: Medicare Other | Attending: Internal Medicine | Admitting: Internal Medicine

## 2016-04-05 ENCOUNTER — Emergency Department: Payer: Medicare Other

## 2016-04-05 DIAGNOSIS — Z7901 Long term (current) use of anticoagulants: Secondary | ICD-10-CM | POA: Diagnosis not present

## 2016-04-05 DIAGNOSIS — I5022 Chronic systolic (congestive) heart failure: Secondary | ICD-10-CM | POA: Diagnosis present

## 2016-04-05 DIAGNOSIS — Z86718 Personal history of other venous thrombosis and embolism: Secondary | ICD-10-CM

## 2016-04-05 DIAGNOSIS — E872 Acidosis: Secondary | ICD-10-CM | POA: Diagnosis present

## 2016-04-05 DIAGNOSIS — Z888 Allergy status to other drugs, medicaments and biological substances status: Secondary | ICD-10-CM | POA: Diagnosis not present

## 2016-04-05 DIAGNOSIS — M1990 Unspecified osteoarthritis, unspecified site: Secondary | ICD-10-CM | POA: Diagnosis present

## 2016-04-05 DIAGNOSIS — Z79899 Other long term (current) drug therapy: Secondary | ICD-10-CM | POA: Diagnosis not present

## 2016-04-05 DIAGNOSIS — E785 Hyperlipidemia, unspecified: Secondary | ICD-10-CM | POA: Diagnosis present

## 2016-04-05 DIAGNOSIS — H409 Unspecified glaucoma: Secondary | ICD-10-CM | POA: Diagnosis present

## 2016-04-05 DIAGNOSIS — Z87891 Personal history of nicotine dependence: Secondary | ICD-10-CM | POA: Diagnosis not present

## 2016-04-05 DIAGNOSIS — Z794 Long term (current) use of insulin: Secondary | ICD-10-CM

## 2016-04-05 DIAGNOSIS — N183 Chronic kidney disease, stage 3 unspecified: Secondary | ICD-10-CM | POA: Diagnosis present

## 2016-04-05 DIAGNOSIS — E11649 Type 2 diabetes mellitus with hypoglycemia without coma: Secondary | ICD-10-CM | POA: Diagnosis present

## 2016-04-05 DIAGNOSIS — Z981 Arthrodesis status: Secondary | ICD-10-CM | POA: Diagnosis not present

## 2016-04-05 DIAGNOSIS — N179 Acute kidney failure, unspecified: Secondary | ICD-10-CM | POA: Diagnosis present

## 2016-04-05 DIAGNOSIS — A419 Sepsis, unspecified organism: Secondary | ICD-10-CM | POA: Diagnosis present

## 2016-04-05 DIAGNOSIS — Z833 Family history of diabetes mellitus: Secondary | ICD-10-CM | POA: Diagnosis not present

## 2016-04-05 DIAGNOSIS — Z800 Family history of malignant neoplasm of digestive organs: Secondary | ICD-10-CM | POA: Diagnosis not present

## 2016-04-05 DIAGNOSIS — E1122 Type 2 diabetes mellitus with diabetic chronic kidney disease: Secondary | ICD-10-CM | POA: Diagnosis present

## 2016-04-05 DIAGNOSIS — I10 Essential (primary) hypertension: Secondary | ICD-10-CM | POA: Diagnosis present

## 2016-04-05 DIAGNOSIS — F0390 Unspecified dementia without behavioral disturbance: Secondary | ICD-10-CM | POA: Diagnosis present

## 2016-04-05 DIAGNOSIS — L89152 Pressure ulcer of sacral region, stage 2: Secondary | ICD-10-CM | POA: Diagnosis present

## 2016-04-05 DIAGNOSIS — I130 Hypertensive heart and chronic kidney disease with heart failure and stage 1 through stage 4 chronic kidney disease, or unspecified chronic kidney disease: Secondary | ICD-10-CM | POA: Diagnosis present

## 2016-04-05 DIAGNOSIS — J189 Pneumonia, unspecified organism: Secondary | ICD-10-CM | POA: Diagnosis present

## 2016-04-05 DIAGNOSIS — Z8261 Family history of arthritis: Secondary | ICD-10-CM | POA: Diagnosis not present

## 2016-04-05 DIAGNOSIS — G309 Alzheimer's disease, unspecified: Secondary | ICD-10-CM

## 2016-04-05 DIAGNOSIS — J181 Lobar pneumonia, unspecified organism: Secondary | ICD-10-CM | POA: Diagnosis not present

## 2016-04-05 DIAGNOSIS — F0280 Dementia in other diseases classified elsewhere without behavioral disturbance: Secondary | ICD-10-CM | POA: Diagnosis present

## 2016-04-05 DIAGNOSIS — E119 Type 2 diabetes mellitus without complications: Secondary | ICD-10-CM

## 2016-04-05 LAB — CBC
HEMATOCRIT: 33.8 % — AB (ref 40.0–52.0)
HEMOGLOBIN: 11.4 g/dL — AB (ref 13.0–18.0)
MCH: 31.2 pg (ref 26.0–34.0)
MCHC: 33.8 g/dL (ref 32.0–36.0)
MCV: 92.4 fL (ref 80.0–100.0)
Platelets: 255 10*3/uL (ref 150–440)
RBC: 3.65 MIL/uL — ABNORMAL LOW (ref 4.40–5.90)
RDW: 15.5 % — AB (ref 11.5–14.5)
WBC: 11.8 10*3/uL — AB (ref 3.8–10.6)

## 2016-04-05 LAB — LACTIC ACID, PLASMA
LACTIC ACID, VENOUS: 2.3 mmol/L — AB (ref 0.5–1.9)
LACTIC ACID, VENOUS: 2.3 mmol/L — AB (ref 0.5–1.9)

## 2016-04-05 LAB — URINALYSIS, COMPLETE (UACMP) WITH MICROSCOPIC
BILIRUBIN URINE: NEGATIVE
Glucose, UA: NEGATIVE mg/dL
KETONES UR: NEGATIVE mg/dL
Leukocytes, UA: NEGATIVE
Nitrite: NEGATIVE
Protein, ur: 100 mg/dL — AB
Specific Gravity, Urine: 1.012 (ref 1.005–1.030)
pH: 5 (ref 5.0–8.0)

## 2016-04-05 LAB — COMPREHENSIVE METABOLIC PANEL
ALBUMIN: 3.4 g/dL — AB (ref 3.5–5.0)
ALK PHOS: 66 U/L (ref 38–126)
ALT: 18 U/L (ref 17–63)
ANION GAP: 8 (ref 5–15)
AST: 27 U/L (ref 15–41)
BILIRUBIN TOTAL: 0.5 mg/dL (ref 0.3–1.2)
BUN: 26 mg/dL — ABNORMAL HIGH (ref 6–20)
CALCIUM: 9 mg/dL (ref 8.9–10.3)
CO2: 26 mmol/L (ref 22–32)
Chloride: 99 mmol/L — ABNORMAL LOW (ref 101–111)
Creatinine, Ser: 1.82 mg/dL — ABNORMAL HIGH (ref 0.61–1.24)
GFR, EST AFRICAN AMERICAN: 38 mL/min — AB (ref >60)
GFR, EST NON AFRICAN AMERICAN: 33 mL/min — AB (ref >60)
GLUCOSE: 202 mg/dL — AB (ref 65–99)
POTASSIUM: 4.6 mmol/L (ref 3.5–5.1)
Sodium: 133 mmol/L — ABNORMAL LOW (ref 135–145)
TOTAL PROTEIN: 7.9 g/dL (ref 6.5–8.1)

## 2016-04-05 LAB — PROCALCITONIN: PROCALCITONIN: 3.92 ng/mL

## 2016-04-05 LAB — PROTIME-INR
INR: 1.6
Prothrombin Time: 19.2 seconds — ABNORMAL HIGH (ref 11.4–15.2)

## 2016-04-05 LAB — CK: CK TOTAL: 127 U/L (ref 49–397)

## 2016-04-05 LAB — TROPONIN I

## 2016-04-05 MED ORDER — ONDANSETRON HCL 4 MG PO TABS: 4.0000 mg | ORAL_TABLET | Freq: Four times a day (QID) | ORAL | Status: DC | PRN

## 2016-04-05 MED ORDER — INSULIN ASPART 100 UNIT/ML ~~LOC~~ SOLN
0.0000 [IU] | Freq: Three times a day (TID) | SUBCUTANEOUS | Status: DC
  Administered 2016-04-06 (×2): 3 [IU] via SUBCUTANEOUS
  Administered 2016-04-07: 7 [IU] via SUBCUTANEOUS
  Administered 2016-04-07: 3 [IU] via SUBCUTANEOUS
  Administered 2016-04-07 – 2016-04-08 (×3): 2 [IU] via SUBCUTANEOUS
  Filled 2016-04-05 (×2): qty 2
  Filled 2016-04-05 (×2): qty 3
  Filled 2016-04-05: qty 7
  Filled 2016-04-05: qty 2
  Filled 2016-04-05: qty 3

## 2016-04-05 MED ORDER — SODIUM CHLORIDE 0.9 % IV BOLUS (SEPSIS)
1000.0000 mL | Freq: Once | INTRAVENOUS | Status: AC
Start: 2016-04-05 — End: 2016-04-05
  Administered 2016-04-05: 1000 mL via INTRAVENOUS

## 2016-04-05 MED ORDER — DEXTROSE 5 % IV SOLN
1.0000 g | INTRAVENOUS | Status: DC
  Administered 2016-04-06 – 2016-04-07 (×2): 1 g via INTRAVENOUS
  Filled 2016-04-05 (×3): qty 10

## 2016-04-05 MED ORDER — TIMOLOL MALEATE 0.5 % OP SOLN
1.0000 [drp] | Freq: Two times a day (BID) | OPHTHALMIC | Status: DC
  Administered 2016-04-06 – 2016-04-08 (×5): 1 [drp] via OPHTHALMIC
  Filled 2016-04-05: qty 5

## 2016-04-05 MED ORDER — INSULIN ASPART 100 UNIT/ML ~~LOC~~ SOLN
0.0000 [IU] | Freq: Every day | SUBCUTANEOUS | Status: DC
  Administered 2016-04-06: 3 [IU] via SUBCUTANEOUS
  Administered 2016-04-07: 2 [IU] via SUBCUTANEOUS
  Filled 2016-04-05: qty 2
  Filled 2016-04-05: qty 3

## 2016-04-05 MED ORDER — ONDANSETRON HCL 4 MG/2ML IJ SOLN: 4.0000 mg | Freq: Four times a day (QID) | INTRAMUSCULAR | Status: DC | PRN

## 2016-04-05 MED ORDER — RIVAROXABAN 20 MG PO TABS
20.0000 mg | ORAL_TABLET | Freq: Every day | ORAL | Status: DC
  Administered 2016-04-06: 20 mg via ORAL
  Filled 2016-04-05: qty 1

## 2016-04-05 MED ORDER — METOPROLOL TARTRATE 25 MG PO TABS
25.0000 mg | ORAL_TABLET | Freq: Two times a day (BID) | ORAL | Status: DC
  Administered 2016-04-06 – 2016-04-08 (×5): 25 mg via ORAL
  Filled 2016-04-05 (×5): qty 1

## 2016-04-05 MED ORDER — DEXTROSE 5 % IV SOLN
500.0000 mg | INTRAVENOUS | Status: DC
  Administered 2016-04-06: 500 mg via INTRAVENOUS
  Filled 2016-04-05 (×2): qty 500

## 2016-04-05 MED ORDER — DEXTROSE 5 % IV SOLN: 1.0000 g | Freq: Once | INTRAVENOUS | Status: DC

## 2016-04-05 MED ORDER — ACETAMINOPHEN 325 MG PO TABS: 650.0000 mg | ORAL_TABLET | Freq: Four times a day (QID) | ORAL | Status: DC | PRN

## 2016-04-05 MED ORDER — SODIUM CHLORIDE 0.9 % IV SOLN: INTRAVENOUS | Status: DC

## 2016-04-05 MED ORDER — PANTOPRAZOLE SODIUM 40 MG PO TBEC
40.0000 mg | DELAYED_RELEASE_TABLET | Freq: Every day | ORAL | Status: DC
  Administered 2016-04-06 – 2016-04-08 (×3): 40 mg via ORAL
  Filled 2016-04-05 (×3): qty 1

## 2016-04-05 MED ORDER — SENNA 8.6 MG PO TABS
1.0000 | ORAL_TABLET | Freq: Every day | ORAL | Status: DC
  Administered 2016-04-06 – 2016-04-08 (×3): 8.6 mg via ORAL
  Filled 2016-04-05 (×3): qty 1

## 2016-04-05 MED ORDER — BENZONATATE 100 MG PO CAPS: 200.0000 mg | ORAL_CAPSULE | Freq: Three times a day (TID) | ORAL | Status: DC | PRN

## 2016-04-05 MED ORDER — CEFTRIAXONE SODIUM-DEXTROSE 1-3.74 GM-% IV SOLR
1.0000 g | Freq: Once | INTRAVENOUS | Status: AC
  Administered 2016-04-05: 1 g via INTRAVENOUS
  Filled 2016-04-05: qty 50

## 2016-04-05 MED ORDER — IPRATROPIUM-ALBUTEROL 0.5-2.5 (3) MG/3ML IN SOLN: 3.0000 mL | RESPIRATORY_TRACT | Status: DC | PRN

## 2016-04-05 MED ORDER — DOCUSATE SODIUM 100 MG PO CAPS: 100.0000 mg | ORAL_CAPSULE | Freq: Every day | ORAL | Status: DC | PRN

## 2016-04-05 MED ORDER — DONEPEZIL HCL 5 MG PO TABS
10.0000 mg | ORAL_TABLET | Freq: Every day | ORAL | Status: DC
  Administered 2016-04-06 – 2016-04-07 (×2): 10 mg via ORAL
  Filled 2016-04-05 (×2): qty 2

## 2016-04-05 MED ORDER — MEMANTINE HCL ER 14 MG PO CP24
28.0000 mg | ORAL_CAPSULE | Freq: Every day | ORAL | Status: DC
  Administered 2016-04-06 – 2016-04-08 (×3): 28 mg via ORAL
  Filled 2016-04-05 (×3): qty 2

## 2016-04-05 MED ORDER — ACETAMINOPHEN 650 MG RE SUPP: 650.0000 mg | Freq: Four times a day (QID) | RECTAL | Status: DC | PRN

## 2016-04-05 MED ORDER — ATORVASTATIN CALCIUM 20 MG PO TABS
20.0000 mg | ORAL_TABLET | Freq: Every day | ORAL | Status: DC
  Administered 2016-04-06 – 2016-04-08 (×3): 20 mg via ORAL
  Filled 2016-04-05 (×3): qty 1

## 2016-04-05 MED ORDER — DEXTROSE 5 % IV SOLN
500.0000 mg | Freq: Once | INTRAVENOUS | Status: AC
  Administered 2016-04-05: 500 mg via INTRAVENOUS
  Filled 2016-04-05: qty 500

## 2016-04-05 MED ORDER — HYDROCODONE-ACETAMINOPHEN 5-325 MG PO TABS: 1.0000 | ORAL_TABLET | Freq: Four times a day (QID) | ORAL | Status: DC | PRN

## 2016-04-05 MED ORDER — SODIUM CHLORIDE 0.9 % IV BOLUS (SEPSIS)
1000.0000 mL | Freq: Once | INTRAVENOUS | Status: AC
  Administered 2016-04-05: 1000 mL via INTRAVENOUS

## 2016-04-05 MED ORDER — DULOXETINE HCL 60 MG PO CPEP
60.0000 mg | ORAL_CAPSULE | Freq: Every day | ORAL | Status: DC
  Administered 2016-04-06 – 2016-04-08 (×3): 60 mg via ORAL
  Filled 2016-04-05 (×3): qty 1

## 2016-04-05 NOTE — ED Triage Notes (Addendum)
Pt came to ED via EMS from home. Per family, pt is altered. Has been feeling sob. Per family, started with him shaking and trying to catch his breath. o2 92 r/a. HR 103, bp 116/71 upon arrival. Afebrile.

## 2016-04-05 NOTE — H&P (Signed)
[facility] NAME: William Byrd    MR#:  [id number]  DATE OF BIRTH:  [birth year]  DATE OF ADMISSION:  04/05/2016  PRIMARY CARE PHYSICIAN: Dion Body, MD   REQUESTING/REFERRING PHYSICIAN: Mable Paris, MD  CHIEF COMPLAINT:   Chief Complaint  Patient presents with  . Altered Mental Status  . Shortness of Breath    HISTORY OF PRESENT ILLNESS:  William Byrd  is a 83 y.o. male who presents with Confusion cough and shortness of breath.  Patient displayed confusion at home, weakness, cough. His daughter's report. He is unable to contribute much to his history of present illness. Here in the ED he was found to have pneumonia, and met sepsis criteria. Hospitalists were called for admission.  PAST MEDICAL HISTORY:   Past Medical History:  Diagnosis Date  . Arthritis   . Background retinopathy   . Cervicalgia   . Chronic kidney disease   . Chronic systolic CHF (congestive heart failure) ([place])   . Degeneration of lumbar or lumbosacral intervertebral disc   . Dementia   . Diabetes mellitus without complication ([place])   . DVT (deep venous thrombosis) ([place])   . Generalized weakness   . Glaucoma   . Hypercholesteremia   . Hypertension   . Long-term insulin use ([place])   . Peripheral polyneuropathy ([place])   . Peripheral vascular disease ([place])   . Spinal stenosis     PAST SURGICAL HISTORY:   Past Surgical History:  Procedure Laterality Date  . c-spine fusion N/A     SOCIAL HISTORY:   Social History  Substance Use Topics  . Smoking status: Former Smoker    Packs/day: 0.50    Years: 30.00  . Smokeless tobacco: Never Used  . Alcohol use No    FAMILY HISTORY:   Family History  Problem Relation Age of Onset  . Diabetes Mother   . Liver cancer Father   . Liver disease Father   . Arthritis Sister     DRUG ALLERGIES:   Allergies  Allergen Reactions  . Fosinopril Other (See Comments)    Reaction:  Hyperkalemia     MEDICATIONS AT HOME:   Prior to Admission medications   Medication Sig Start Date End Date Taking? Authorizing Provider  acetaminophen (TYLENOL) 325 MG tablet Take 650 mg by mouth every 6 (six) hours as needed for mild pain, moderate pain, fever or headache.   Yes Historical Provider, MD  albuterol (PROVENTIL HFA;VENTOLIN HFA) 108 (90 Base) MCG/ACT inhaler Inhale 2 puffs into the lungs every 4 (four) hours as needed for wheezing or shortness of breath.   Yes Historical Provider, MD  amLODipine (NORVASC) 10 MG tablet Take 1 tablet (10 mg total) by mouth daily. 11/15/15  Yes Vaughan Basta, MD  atorvastatin (LIPITOR) 20 MG tablet Take 20 mg by mouth daily.   Yes Historical Provider, MD  cholecalciferol (VITAMIN D) 1000 UNITS tablet Take 1,000 Units by mouth daily.   Yes Historical Provider, MD  donepezil (ARICEPT) 10 MG tablet Take 10 mg by mouth at bedtime. 07/05/14  Yes Historical Provider, MD  DULoxetine (CYMBALTA) 60 MG capsule Take 60 mg by mouth daily. 06/01/14  Yes Historical Provider, MD  finasteride (PROSCAR) 5 MG tablet Take 5 mg by mouth daily. 07/19/14  Yes Historical Provider, MD  furosemide (LASIX) 20 MG tablet Take 40 mg by mouth daily. Take 2 tablets in the morning. Take an additional tablet 6 hours later.   Yes Historical Provider,  MD  memantine (NAMENDA XR) 28 MG CP24 24 hr capsule Take 28 mg by mouth daily.   Yes Historical Provider, MD  metoprolol tartrate (LOPRESSOR) 25 MG tablet Take 1 tablet (25 mg total) by mouth 2 (two) times daily. 09/08/14  Yes Richard Leslye Peer, MD  benzonatate (TESSALON) 200 MG capsule Take 200 mg by mouth 3 (three) times daily as needed for cough.    Historical Provider, MD  bisacodyl (DULCOLAX) 5 MG EC tablet Take 1 tablet (5 mg total) by mouth daily as needed for moderate constipation. Patient not taking: Reported on 04/05/2016 11/14/15   Vaughan Basta, MD  docusate sodium (STOOL SOFTENER) 100 MG capsule Take 100 mg by mouth  daily as needed for mild constipation or moderate constipation.     Historical Provider, MD  feeding supplement, ENSURE ENLIVE, (ENSURE ENLIVE) LIQD Take 237 mLs by mouth 2 (two) times daily between meals. 07/18/15   Dustin Flock, MD  HYDROcodone-acetaminophen (NORCO/VICODIN) 5-325 MG tablet Take 1 tablet by mouth every 6 (six) hours as needed for moderate pain. 11/14/15   Vaughan Basta, MD  insulin detemir (LEVEMIR) 100 UNIT/ML injection Inject 0.1 mLs (10 Units total) into the skin 2 (two) times daily. Patient taking differently: Inject 25 Units into the skin 2 (two) times daily.  11/14/15   Vaughan Basta, MD  pantoprazole (PROTONIX) 40 MG tablet Take 1 tablet (40 mg total) by mouth daily. 11/14/15   Vaughan Basta, MD  potassium chloride SA (K-DUR,KLOR-CON) 20 MEQ tablet Take 1 tablet (20 mEq total) by mouth 2 (two) times daily. 11/14/15   Vaughan Basta, MD  pregabalin (LYRICA) 75 MG capsule Take 1 capsule (75 mg total) by mouth 2 (two) times daily. Patient taking differently: Take 75 mg by mouth daily as needed.  12/31/14   Henreitta Leber, MD  rivaroxaban (XARELTO) 20 MG TABS tablet Take 1 tablet by mouth daily. 07/18/14   Historical Provider, MD  Saw Palmetto-Phytosterols (PROSTATE SR) 160-250 MG CAPS Take 1 capsule by mouth daily.    Historical Provider, MD  senna (SENOKOT) 8.6 MG TABS tablet Take 1 tablet (8.6 mg total) by mouth daily. 11/14/15   Vaughan Basta, MD  timolol (TIMOPTIC) 0.5 % ophthalmic solution Place 1 drop into both eyes 2 (two) times daily.    Historical Provider, MD    REVIEW OF SYSTEMS:  Review of Systems  Unable to perform ROS: Dementia     VITAL SIGNS:   Vitals:   04/05/16 1819 04/05/16 1820  BP: 116/71   Pulse: (!) 103   Resp: 17   Temp: 98.3 F (36.8 C)   TempSrc: Oral   SpO2: 93%   Weight:  76.7 kg (169 lb)  Height:  5' 11"  (1.803 m)   Wt Readings from Last 3 Encounters:  04/05/16 76.7 kg (169 lb)  12/03/15 73.8 kg  (162 lb 9.6 oz)  11/10/15 79.9 kg (176 lb 3.2 oz)    PHYSICAL EXAMINATION:  Physical Exam  Vitals reviewed. Constitutional: He appears well-developed and well-nourished. No distress.  HENT:  Head: Normocephalic and atraumatic.  Mouth/Throat: Oropharynx is clear and moist.  Eyes: Conjunctivae and EOM are normal. Pupils are equal, round, and reactive to light. No scleral icterus.  Neck: Normal range of motion. Neck supple. No JVD present. No thyromegaly present.  Cardiovascular: Normal rate, regular rhythm and intact distal pulses.  Exam reveals no gallop and no friction rub.   No murmur heard. Respiratory: Effort normal. No respiratory distress. He has no wheezes. He has  no rales.  Coarse breath sounds  GI: Soft. Bowel sounds are normal. He exhibits no distension. There is no tenderness.  Musculoskeletal: Normal range of motion. He exhibits no edema.  No arthritis, no gout  Lymphadenopathy:    He has no cervical adenopathy.  Neurological: He is alert. No cranial nerve deficit.  No dysarthria, no aphasia, unable to fully assess due to dementia  Skin: Skin is warm and dry. No rash noted. No erythema.  Psychiatric:  Unable to assess due to dementia    LABORATORY PANEL:   CBC  Recent Labs Lab 04/05/16 1848  WBC 11.8*  HGB 11.4*  HCT 33.8*  PLT 255   ------------------------------------------------------------------------------------------------------------------  Chemistries   Recent Labs Lab 04/05/16 1848  NA 133*  K 4.6  CL 99*  CO2 26  GLUCOSE 202*  BUN 26*  CREATININE 1.82*  CALCIUM 9.0  AST 27  ALT 18  ALKPHOS 66  BILITOT 0.5   ------------------------------------------------------------------------------------------------------------------  Cardiac Enzymes  Recent Labs Lab 04/05/16 1848  TROPONINI <0.03   ------------------------------------------------------------------------------------------------------------------  RADIOLOGY:  Ct Head Wo  Contrast  Result Date: 04/05/2016 CLINICAL DATA:  Pt came to ED via EMS from home. Per family, pt is altered. Has been feeling sob. Per family, started with him shaking and trying to catch his breath EXAM: CT HEAD WITHOUT CONTRAST TECHNIQUE: Contiguous axial images were obtained from the base of the skull through the vertex without intravenous contrast. COMPARISON:  10/17/2015 FINDINGS: Brain: The ventricles are normal configuration. There is ventricular and sulcal enlargement reflecting moderate diffuse atrophy, greater than expected for age, but stable. No hydrocephalus. There are no parenchymal masses or mass effect. There is no evidence of a cortical infarct. Focal areas of hypoattenuation are noted in the pons consistent with old lacune infarcts. Patchy periventricular white matter hypoattenuation is noted consistent with mild chronic microvascular ischemic change. There are no extra-axial masses or abnormal fluid collections. There is no intracranial hemorrhage. Vascular: No hyperdense vessel or unexpected calcification. Skull: Normal. Negative for fracture or focal lesion. Sinuses/Orbits: Visualize globes and orbits are unremarkable. Visualized sinuses and mastoid air cells are clear. Other: None. IMPRESSION: 1. No acute intracranial abnormalities. 2. 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 Electronically Signed   By: Lajean Manes M.D.   On: 04/05/2016 20:19   Dg Chest Port 1 View  Result Date: 04/05/2016 CLINICAL DATA:  Per family Patient is altered. He has been feeling short of breath. Family noticed it started with him shaking and trying to catch his breath. HX ckd, dementia, diabetes, dvt, htn, pvd, cervical fusion EXAM: PORTABLE CHEST 1 VIEW COMPARISON:  11/13/2015.  CT, 12/28/2014 FINDINGS: Cardiac silhouette is normal in size. No mediastinal or hilar masses or convincing adenopathy. There are irregular interstitial type  opacities at the left lung base which are new from the prior study. Remainder of the lungs is clear. No convincing pleural effusion. No pneumothorax. Multiple calcifications are noted in the spleen, stable. Skeletal structures are demineralized but grossly intact. IMPRESSION: 1. Irregular interstitial type opacities in the left lower lung consistent with bronchial inflammation or infection or possible bronchopneumonia. No other evidence of acute cardiopulmonary disease. Electronically Signed   By: Lajean Manes M.D.   On: 04/05/2016 19:13    EKG:   Orders placed or performed during the hospital encounter of 04/05/16  . ED EKG  . ED EKG    IMPRESSION AND PLAN:  Principal Problem:  Sepsis due to pneumonia ([place]) - IV antibiotics started in the ED and continued on admission. Cultures sent from the ED. Patient is hemodynamically stable. Lactic acid was mildly elevated, IV fluids started and we will trend his lactate until within normal limits Active Problems:   DM2 (diabetes mellitus, type 2) (HCC) - sliding scale insulin with corresponding glucose checks   HTN (hypertension) - patient is currently normotensive, we will hold home antihypertensives for now   Dementia - continue home meds   CKD (chronic kidney disease) stage 3, GFR 30-59 ml/min - at baseline, avoid nephrotoxins and monitor   HLD (hyperlipidemia) - home meds  All the records are reviewed and case discussed with ED provider. Management plans discussed with the patient and/or family.  DVT PROPHYLAXIS: SubQ heparin  GI PROPHYLAXIS: PPI  ADMISSION STATUS: Inpatient  CODE STATUS: Full Code Status History    Date Active Date Inactive Code Status Order ID Comments User Context   11/10/2015 12:25 AM 11/15/2015  6:25 PM Full Code [id number]  Harvie Bridge, DO Inpatient   10/17/2015  8:18 PM 10/19/2015  3:09 PM Full Code [id number]  Henreitta Leber, MD Inpatient   07/15/2015  8:30 AM 07/18/2015  9:00 PM Full Code [id number]  Dustin Flock,  MD ED   12/28/2014  9:50 PM 12/31/2014  4:38 PM Full Code [id number]  Vaughan Basta, MD Inpatient   09/05/2014  4:09 AM 09/08/2014  6:11 PM Full Code [id number]  Juluis Mire, MD ED      TOTAL TIME TAKING CARE OF THIS PATIENT: 45 minutes.    Dellis Voght [place] 04/05/2016, 8:48 PM  [facility]  Office  [phone]  CC: Primary care physician; Dion Body, MD

## 2016-04-05 NOTE — Progress Notes (Signed)
Pharmacy Antibiotic Note  William Byrd is a 83 y.o. male admitted on 04/05/2016 with  sepsis secondary to PNA.  Pharmacy has been consulted for ceftriaxone and azithromycin dosing. Patient received azithromycin 500mg  IV and ceftriaxone 1gm IV x 1 dose in ED  Plan: Will start the patient on ceftriaxone 1gm IV every 24 hours.   Will start the patient on Azithromycin 500mg  IV every 24 hours.   Height: 5\' 11"  (180.3 cm) Weight: 169 lb (76.7 kg) IBW/kg (Calculated) : 75.3  Temp (24hrs), Avg:98.3 F (36.8 C), Min:98.3 F (36.8 C), Max:98.3 F (36.8 C)   Recent Labs Lab 04/05/16 1848  WBC 11.8*  CREATININE 1.82*  LATICACIDVEN 2.3*    Estimated Creatinine Clearance: 33.3 mL/min (by C-G formula based on SCr of 1.82 mg/dL (H)).    Allergies  Allergen Reactions  . Fosinopril Other (See Comments)    Reaction: Hyperkalemia     Antimicrobials this admission: 2/24 ceftriaxone >>  2/24 azithromycin >>   Dose adjustments this admission:   Microbiology results: 2/24  BCx: pending  Thank you for allowing pharmacy to be a part of this patient's care.  Gardner CandleSheema M Danitza Schoenfeldt, PharmD, BCPS Clinical Pharmacist 04/05/2016 9:00 PM

## 2016-04-05 NOTE — ED Provider Notes (Signed)
[facility] Emergency Department Provider Note  ____________________________________________   First MD Initiated Contact with Patient 04/05/16 1827     (approximate)  I have reviewed the triage vital signs and the nursing notes.   HISTORY  Chief Complaint Altered Mental Status and Shortness of Breath  History Limited by dementia   HPI Sampson SiJames C Ivie is a 83 y.o. male who is brought to the emergency department via EMS for weakness and altered mental status that began this morning. He has a history of dementia and his daughter is his caretaker. She noted that this morning he was weaker than he usually is and she couldn't sit up on his own and he was breathing heavily. She denies fever. Denies nausea or vomiting. He is not eating as much as he normally does.   Past Medical History:  Diagnosis Date  . Arthritis   . Background retinopathy   . Cervicalgia   . Chronic kidney disease   . Chronic systolic CHF (congestive heart failure) (HCC)   . Degeneration of lumbar or lumbosacral intervertebral disc   . Dementia   . Diabetes mellitus without complication (HCC)   . DVT (deep venous thrombosis) (HCC)   . Generalized weakness   . Glaucoma   . Hypercholesteremia   . Hypertension   . Long-term insulin use (HCC)   . Peripheral polyneuropathy (HCC)   . Peripheral vascular disease (HCC)   . Spinal stenosis     Patient Active Problem List   Diagnosis Date Noted  . Sepsis due to pneumonia (HCC) 11/09/2015  . Pneumonia 10/17/2015  . CVA (cerebral infarction) 07/15/2015  . Dehydration 12/28/2014  . Acute on chronic renal failure (HCC) 12/28/2014  . Generalized weakness 12/28/2014  . Acute renal failure (ARF) (HCC) 12/28/2014  . Lower extremity weakness 09/05/2014  . DVT of lower extremity, bilateral (HCC) 09/05/2014  . DM2 (diabetes mellitus, type 2) (HCC) 09/05/2014  . HTN (hypertension) 09/05/2014  . Dementia 09/05/2014  . CKD (chronic kidney  disease) stage 3, GFR 30-59 ml/min 09/05/2014  . HLD (hyperlipidemia) 09/05/2014  . Spinal stenosis 09/05/2014    Past Surgical History:  Procedure Laterality Date  . c-spine fusion N/A     Prior to Admission medications   Medication Sig Start Date End Date Taking? Authorizing Provider  acetaminophen (TYLENOL) 325 MG tablet Take 650 mg by mouth every 6 (six) hours as needed for mild pain, moderate pain, fever or headache.   Yes Historical Provider, MD  albuterol (PROVENTIL HFA;VENTOLIN HFA) 108 (90 Base) MCG/ACT inhaler Inhale 2 puffs into the lungs every 4 (four) hours as needed for wheezing or shortness of breath.   Yes Historical Provider, MD  amLODipine (NORVASC) 10 MG tablet Take 1 tablet (10 mg total) by mouth daily. 11/15/15  Yes Altamese DillingVaibhavkumar Vachhani, MD  atorvastatin (LIPITOR) 20 MG tablet Take 20 mg by mouth daily.   Yes Historical Provider, MD  cholecalciferol (VITAMIN D) 1000 UNITS tablet Take 1,000 Units by mouth daily.   Yes Historical Provider, MD  donepezil (ARICEPT) 10 MG tablet Take 10 mg by mouth at bedtime. 07/05/14  Yes Historical Provider, MD  DULoxetine (CYMBALTA) 60 MG capsule Take 60 mg by mouth daily. 06/01/14  Yes Historical Provider, MD  finasteride (PROSCAR) 5 MG tablet Take 5 mg by mouth daily. 07/19/14  Yes Historical Provider, MD  furosemide (LASIX) 20 MG tablet Take 40 mg by mouth daily. Take 2 tablets in the morning. Take an additional tablet 6 hours later.  Yes Historical Provider, MD  memantine (NAMENDA XR) 28 MG CP24 24 hr capsule Take 28 mg by mouth daily.   Yes Historical Provider, MD  metoprolol tartrate (LOPRESSOR) 25 MG tablet Take 1 tablet (25 mg total) by mouth 2 (two) times daily. 09/08/14  Yes Richard Renae Gloss, MD  benzonatate (TESSALON) 200 MG capsule Take 200 mg by mouth 3 (three) times daily as needed for cough.    Historical Provider, MD  bisacodyl (DULCOLAX) 5 MG EC tablet Take 1 tablet (5 mg total) by mouth daily as needed for moderate  constipation. Patient not taking: Reported on 04/05/2016 11/14/15   Altamese Dilling, MD  docusate sodium (STOOL SOFTENER) 100 MG capsule Take 100 mg by mouth daily as needed for mild constipation or moderate constipation.     Historical Provider, MD  feeding supplement, ENSURE ENLIVE, (ENSURE ENLIVE) LIQD Take 237 mLs by mouth 2 (two) times daily between meals. 07/18/15   Auburn Bilberry, MD  HYDROcodone-acetaminophen (NORCO/VICODIN) 5-325 MG tablet Take 1 tablet by mouth every 6 (six) hours as needed for moderate pain. 11/14/15   Altamese Dilling, MD  insulin detemir (LEVEMIR) 100 UNIT/ML injection Inject 0.1 mLs (10 Units total) into the skin 2 (two) times daily. Patient taking differently: Inject 25 Units into the skin 2 (two) times daily.  11/14/15   Altamese Dilling, MD  pantoprazole (PROTONIX) 40 MG tablet Take 1 tablet (40 mg total) by mouth daily. 11/14/15   Altamese Dilling, MD  potassium chloride SA (K-DUR,KLOR-CON) 20 MEQ tablet Take 1 tablet (20 mEq total) by mouth 2 (two) times daily. 11/14/15   Altamese Dilling, MD  pregabalin (LYRICA) 75 MG capsule Take 1 capsule (75 mg total) by mouth 2 (two) times daily. Patient taking differently: Take 75 mg by mouth daily as needed.  12/31/14   Houston Siren, MD  rivaroxaban (XARELTO) 20 MG TABS tablet Take 1 tablet by mouth daily. 07/18/14   Historical Provider, MD  Saw Palmetto-Phytosterols (PROSTATE SR) 160-250 MG CAPS Take 1 capsule by mouth daily.    Historical Provider, MD  senna (SENOKOT) 8.6 MG TABS tablet Take 1 tablet (8.6 mg total) by mouth daily. 11/14/15   Altamese Dilling, MD  timolol (TIMOPTIC) 0.5 % ophthalmic solution Place 1 drop into both eyes 2 (two) times daily.    Historical Provider, MD    Allergies Fosinopril  Family History  Problem Relation Age of Onset  . Diabetes Mother   . Liver cancer Father   . Liver disease Father   . Arthritis Sister     Social History Social History  Substance Use  Topics  . Smoking status: Former Smoker    Packs/day: 0.50    Years: 30.00  . Smokeless tobacco: Never Used  . Alcohol use No    Review of Systems Constitutional: No fever/chills Eyes: No visual changes. ENT: No sore throat. Cardiovascular: Denies chest pain. Respiratory: Positive shortness of breath. Gastrointestinal: No abdominal pain.  No nausea, no vomiting.  No diarrhea.  No constipation. Genitourinary: Negative for dysuria. Musculoskeletal: Negative for back pain. Skin: Negative for rash. Neurological: Positive for altered mental status  10-point ROS otherwise negative.  ____________________________________________   PHYSICAL EXAM:  VITAL SIGNS: ED Triage Vitals  Enc Vitals Group     BP 04/05/16 1819 116/71     Pulse Rate 04/05/16 1819 (!) 103     Resp 04/05/16 1819 17     Temp 04/05/16 1819 98.3 F (36.8 C)     Temp Source 04/05/16 1819 Oral  SpO2 04/05/16 1819 93 %     Weight 04/05/16 1820 169 lb (76.7 kg)     Height 04/05/16 1820 5\' 11"  (1.803 m)     Head Circumference --      Peak Flow --      Pain Score --      Pain Loc --      Pain Edu? --      Excl. in GC? --     Constitutional: Elevated respiratory rate and actively coughing. Clearly demented and confused Eyes: Conjunctivae are normal. PERRL. EOMI. pupils midrange and equal bilaterally brisk Head: Atraumatic. Nose: No congestion/rhinnorhea. Mouth/Throat: Mucous membranes are moist.  Oropharynx non-erythematous. Neck: No stridor.   Cardiovascular: Tachycardic rate, regular rhythm. Grossly normal heart sounds.  Good peripheral circulation. Respiratory: Slightly to Coarse breath sounds throughout but moving good air Gastrointestinal: Soft and nontender. No distention. No abdominal bruits. No CVA tenderness. Musculoskeletal: No lower extremity tenderness nor edema.  No joint effusions. Neurologic:  Speaks slowly no gross focal neurological deficits Skin:  Skin is warm, dry and intact. No rash  noted. Psychiatric: Significant dementia  ____________________________________________   LABS (all labs ordered are listed, but only abnormal results are displayed)  Labs Reviewed  COMPREHENSIVE METABOLIC PANEL - Abnormal; Notable for the following:       Result Value   Sodium 133 (*)    Chloride 99 (*)    Glucose, Bld 202 (*)    BUN 26 (*)    Creatinine, Ser 1.82 (*)    Albumin 3.4 (*)    GFR calc non Af Amer 33 (*)    GFR calc Af Amer 38 (*)    All other components within normal limits  CBC - Abnormal; Notable for the following:    WBC 11.8 (*)    RBC 3.65 (*)    Hemoglobin 11.4 (*)    HCT 33.8 (*)    RDW 15.5 (*)    All other components within normal limits  LACTIC ACID, PLASMA - Abnormal; Notable for the following:    Lactic Acid, Venous 2.3 (*)    All other components within normal limits  PROTIME-INR - Abnormal; Notable for the following:    Prothrombin Time 19.2 (*)    All other components within normal limits  URINALYSIS, COMPLETE (UACMP) WITH MICROSCOPIC - Abnormal; Notable for the following:    Color, Urine YELLOW (*)    APPearance HAZY (*)    Hgb urine dipstick SMALL (*)    Protein, ur 100 (*)    Bacteria, UA RARE (*)    Squamous Epithelial / LPF 0-5 (*)    All other components within normal limits  CULTURE, BLOOD (ROUTINE X 2)  CULTURE, BLOOD (ROUTINE X 2)  TROPONIN I  CK  LACTIC ACID, PLASMA  PROCALCITONIN  CBG MONITORING, ED  No evidence of urinary tract infections slightly elevated lactic acid ____________________________________________  EKG   ____________________________________________  RADIOLOGY  Chest x-ray is consistent with left lower lobe pneumonia ____________________________________________   PROCEDURES  Procedure(s) performed: no  Procedures  Critical Care performed: no  ____________________________________________   INITIAL IMPRESSION / ASSESSMENT AND PLAN / ED COURSE  Pertinent labs & imaging results that were  available during my care of the patient were reviewed by me and considered in my medical decision making (see chart for details).  On arrival the patient is tachycardic with increasing altered mental status from his baseline. Differential is broad and includes intracerebral process, sepsis, etc. His physical exam is largely  unremarkable aside from coarse breath sounds. Workup is pending.   ----------------------------------------- 7:44 PM on 04/05/2016 -----------------------------------------  The patient's chest x-ray is positive for left lower lobe pneumonia which could very well contribute to his increasing altered mental status. At this point she requires inpatient admission for IV antibiotics for community-acquired pneumonia.   I discussed the case with the hospitalist who is graciously agreed to admit the patient to her service.  ____________________________________________   FINAL CLINICAL IMPRESSION(S) / ED DIAGNOSES  Final diagnoses:  Community acquired pneumonia of left lower lobe of lung (HCC)      NEW MEDICATIONS STARTED DURING THIS VISIT:  New Prescriptions   No medications on file     Note:  This document was prepared using Dragon voice recognition software and may include unintentional dictation errors.     Merrily Brittle, MD 04/05/16 2003

## 2016-04-06 LAB — CBC
HCT: 28.7 % — ABNORMAL LOW (ref 40.0–52.0)
Hemoglobin: 9.8 g/dL — ABNORMAL LOW (ref 13.0–18.0)
MCH: 31.6 pg (ref 26.0–34.0)
MCHC: 34 g/dL (ref 32.0–36.0)
MCV: 92.8 fL (ref 80.0–100.0)
PLATELETS: 221 10*3/uL (ref 150–440)
RBC: 3.09 MIL/uL — ABNORMAL LOW (ref 4.40–5.90)
RDW: 15.4 % — AB (ref 11.5–14.5)
WBC: 10.8 10*3/uL — AB (ref 3.8–10.6)

## 2016-04-06 LAB — BLOOD CULTURE ID PANEL (REFLEXED)
Acinetobacter baumannii: NOT DETECTED
CANDIDA ALBICANS: NOT DETECTED
CANDIDA GLABRATA: NOT DETECTED
CANDIDA KRUSEI: NOT DETECTED
Candida parapsilosis: NOT DETECTED
Candida tropicalis: NOT DETECTED
ENTEROBACTER CLOACAE COMPLEX: NOT DETECTED
ENTEROBACTERIACEAE SPECIES: NOT DETECTED
ENTEROCOCCUS SPECIES: NOT DETECTED
ESCHERICHIA COLI: NOT DETECTED
Haemophilus influenzae: NOT DETECTED
Klebsiella oxytoca: NOT DETECTED
Klebsiella pneumoniae: NOT DETECTED
Listeria monocytogenes: NOT DETECTED
Methicillin resistance: DETECTED — AB
NEISSERIA MENINGITIDIS: NOT DETECTED
PROTEUS SPECIES: NOT DETECTED
PSEUDOMONAS AERUGINOSA: NOT DETECTED
STREPTOCOCCUS AGALACTIAE: NOT DETECTED
STREPTOCOCCUS PNEUMONIAE: NOT DETECTED
STREPTOCOCCUS PYOGENES: NOT DETECTED
STREPTOCOCCUS SPECIES: NOT DETECTED
Serratia marcescens: NOT DETECTED
Staphylococcus aureus (BCID): NOT DETECTED
Staphylococcus species: DETECTED — AB

## 2016-04-06 LAB — BASIC METABOLIC PANEL
ANION GAP: 5 (ref 5–15)
BUN: 23 mg/dL — ABNORMAL HIGH (ref 6–20)
CHLORIDE: 107 mmol/L (ref 101–111)
CO2: 27 mmol/L (ref 22–32)
Calcium: 8.3 mg/dL — ABNORMAL LOW (ref 8.9–10.3)
Creatinine, Ser: 1.38 mg/dL — ABNORMAL HIGH (ref 0.61–1.24)
GFR, EST AFRICAN AMERICAN: 53 mL/min — AB (ref >60)
GFR, EST NON AFRICAN AMERICAN: 46 mL/min — AB (ref >60)
Glucose, Bld: 144 mg/dL — ABNORMAL HIGH (ref 65–99)
POTASSIUM: 3.9 mmol/L (ref 3.5–5.1)
SODIUM: 139 mmol/L (ref 135–145)

## 2016-04-06 LAB — MRSA PCR SCREENING: MRSA by PCR: NEGATIVE

## 2016-04-06 LAB — GLUCOSE, CAPILLARY
GLUCOSE-CAPILLARY: 99 mg/dL (ref 65–99)
GLUCOSE-CAPILLARY: 208 mg/dL — AB (ref 65–99)
Glucose-Capillary: 234 mg/dL — ABNORMAL HIGH (ref 65–99)
Glucose-Capillary: 262 mg/dL — ABNORMAL HIGH (ref 65–99)

## 2016-04-06 LAB — LACTIC ACID, PLASMA: Lactic Acid, Venous: 1.1 mmol/L (ref 0.5–1.9)

## 2016-04-06 MED ORDER — VANCOMYCIN HCL IN DEXTROSE 1-5 GM/200ML-% IV SOLN
1000.0000 mg | INTRAVENOUS | Status: DC
  Administered 2016-04-07 (×2): 1000 mg via INTRAVENOUS
  Filled 2016-04-06 (×5): qty 200

## 2016-04-06 MED ORDER — VANCOMYCIN HCL IN DEXTROSE 1-5 GM/200ML-% IV SOLN
1000.0000 mg | Freq: Once | INTRAVENOUS | Status: AC
  Administered 2016-04-06: 1000 mg via INTRAVENOUS
  Filled 2016-04-06: qty 200

## 2016-04-06 NOTE — Progress Notes (Signed)
PHARMACY - PHYSICIAN COMMUNICATION CRITICAL VALUE ALERT - BLOOD CULTURE IDENTIFICATION (BCID)  Results for orders placed or performed during the hospital encounter of 04/05/16  Blood Culture ID Panel (Reflexed) (Collected: 04/05/2016  8:23 PM)  Result Value Ref Range   Enterococcus species NOT DETECTED NOT DETECTED   Listeria monocytogenes NOT DETECTED NOT DETECTED   Staphylococcus species DETECTED (A) NOT DETECTED   Staphylococcus aureus NOT DETECTED NOT DETECTED   Methicillin resistance DETECTED (A) NOT DETECTED   Streptococcus species NOT DETECTED NOT DETECTED   Streptococcus agalactiae NOT DETECTED NOT DETECTED   Streptococcus pneumoniae NOT DETECTED NOT DETECTED   Streptococcus pyogenes NOT DETECTED NOT DETECTED   Acinetobacter baumannii NOT DETECTED NOT DETECTED   Enterobacteriaceae species NOT DETECTED NOT DETECTED   Enterobacter cloacae complex NOT DETECTED NOT DETECTED   Escherichia coli NOT DETECTED NOT DETECTED   Klebsiella oxytoca NOT DETECTED NOT DETECTED   Klebsiella pneumoniae NOT DETECTED NOT DETECTED   Proteus species NOT DETECTED NOT DETECTED   Serratia marcescens NOT DETECTED NOT DETECTED   Haemophilus influenzae NOT DETECTED NOT DETECTED   Neisseria meningitidis NOT DETECTED NOT DETECTED   Pseudomonas aeruginosa NOT DETECTED NOT DETECTED   Candida albicans NOT DETECTED NOT DETECTED   Candida glabrata NOT DETECTED NOT DETECTED   Candida krusei NOT DETECTED NOT DETECTED   Candida parapsilosis NOT DETECTED NOT DETECTED   Candida tropicalis NOT DETECTED NOT DETECTED    Name of physician (or Provider) Contacted: Dr. Renae GlossWieting   Changes to prescribed antibiotics required: Add vancomycin   Gardner CandleSheema M Georgetta Crafton, PharmD, BCPS Clinical Pharmacist 04/06/2016 4:52 PM

## 2016-04-06 NOTE — Progress Notes (Signed)
Patient ID: William Byrd, male   DOB: 05-24-1931, 83 y.o.   MRN: [id number]  [facility] PROGRESS NOTE  William Byrd WUJ:[id number] DOB: Nov 14, 1931 DOA: 04/05/2016 PCP: Marisue Ivan, MD  HPI/Subjective: Patient with dementia but answers some questions appropriately. Confused on some other answers. Patient feels okay. Some cough. Some shortness of breath.  Objective: Vitals:   04/06/16 0806 04/06/16 1123  BP: (!) 142/67 140/70  Pulse: 81 88  Resp: 17   Temp: 97.2 F (36.2 C)     Filed Weights   04/05/16 1820  Weight: 76.7 kg (169 lb)    ROS: Review of Systems  Unable to perform ROS: Dementia  Respiratory: Positive for cough and shortness of breath.   Cardiovascular: Negative for chest pain.  Gastrointestinal: Negative for abdominal pain.  Musculoskeletal: Negative for joint pain.   Exam: Physical Exam  HENT:  Nose: No mucosal edema.  Mouth/Throat: No oropharyngeal exudate or posterior oropharyngeal edema.  Eyes: Conjunctivae, EOM and lids are normal. Pupils are equal, round, and reactive to light.  Neck: No JVD present. Carotid bruit is not present. No edema present. No thyroid mass and no thyromegaly present.  Cardiovascular: S1 normal and S2 normal.  Exam reveals no gallop.   No murmur heard. Pulses:      Dorsalis pedis pulses are 2+ on the right side, and 2+ on the left side.  Respiratory: No respiratory distress. He has no wheezes. He has rhonchi in the right lower field and the left lower field. He has no rales.  GI: Soft. Bowel sounds are normal. There is no tenderness.  Musculoskeletal:       Right ankle: He exhibits swelling.       Left ankle: He exhibits swelling.  Lymphadenopathy:    He has no cervical adenopathy.  Neurological: He is alert.  Moves his arms pretty good.  Skin: Skin is warm. No rash noted. Nails show no clubbing.  As per nursing stage II decubitus on buttock  Psychiatric: He has a normal mood and affect.      Data  Reviewed: Basic Metabolic Panel:  Recent Labs Lab 04/05/16 1848 04/06/16 0431  NA 133* 139  K 4.6 3.9  CL 99* 107  CO2 26 27  GLUCOSE 202* 144*  BUN 26* 23*  CREATININE 1.82* 1.38*  CALCIUM 9.0 8.3*   Liver Function Tests:  Recent Labs Lab 04/05/16 1848  AST 27  ALT 18  ALKPHOS 66  BILITOT 0.5  PROT 7.9  ALBUMIN 3.4*   CBC:  Recent Labs Lab 04/05/16 1848 04/06/16 0431  WBC 11.8* 10.8*  HGB 11.4* 9.8*  HCT 33.8* 28.7*  MCV 92.4 92.8  PLT 255 221   Cardiac Enzymes:  Recent Labs Lab 04/05/16 1848  CKTOTAL 127  TROPONINI <0.03    CBG:  Recent Labs Lab 04/06/16 0803 04/06/16 1129  GLUCAP 99 234*    Recent Results (from the past 240 hour(s))  Blood Culture (routine x 2)     Status: None (Preliminary result)   Collection Time: 04/05/16  8:23 PM  Result Value Ref Range Status   Specimen Description BLOOD LEFT HAND  Final   Special Requests   Final    BOTTLES DRAWN AEROBIC AND ANAEROBIC 9CCAERO,6CCANA,BCAV VOLUME ADEQUATE   Culture NO GROWTH < 12 HOURS  Final   Report Status PENDING  Incomplete  Blood Culture (routine x 2)     Status: None (Preliminary result)   Collection Time: 04/05/16  8:23 PM  Result Value  Ref Range Status   Specimen Description BLOOD LEFT WRIST  Final   Special Requests   Final    BOTTLES DRAWN AEROBIC AND ANAEROBIC 4CCAERO,6CCANA,BCLV LOW VOLUME   Culture NO GROWTH < 12 HOURS  Final   Report Status PENDING  Incomplete  MRSA PCR Screening     Status: None   Collection Time: 04/05/16  9:54 PM  Result Value Ref Range Status   MRSA by PCR NEGATIVE NEGATIVE Final    Comment:        The GeneXpert MRSA Assay (FDA approved for NASAL specimens only), is one component of a comprehensive MRSA colonization surveillance program. It is not intended to diagnose MRSA infection nor to guide or monitor treatment for MRSA infections.      Studies: Ct Head Wo Contrast  Result Date: 04/05/2016 CLINICAL DATA:  Pt came to ED via  EMS from home. Per family, pt is altered. Has been feeling sob. Per family, started with him shaking and trying to catch his breath EXAM: CT HEAD WITHOUT CONTRAST TECHNIQUE: Contiguous axial images were obtained from the base of the skull through the vertex without intravenous contrast. COMPARISON:  10/17/2015 FINDINGS: Brain: The ventricles are normal configuration. There is ventricular and sulcal enlargement reflecting moderate diffuse atrophy, greater than expected for age, but stable. No hydrocephalus. There are no parenchymal masses or mass effect. There is no evidence of a cortical infarct. Focal areas of hypoattenuation are noted in the pons consistent with old lacune infarcts. Patchy periventricular white matter hypoattenuation is noted consistent with mild chronic microvascular ischemic change. There are no extra-axial masses or abnormal fluid collections. There is no intracranial hemorrhage. Vascular: No hyperdense vessel or unexpected calcification. Skull: Normal. Negative for fracture or focal lesion. Sinuses/Orbits: Visualize globes and orbits are unremarkable. Visualized sinuses and mastoid air cells are clear. Other: None. IMPRESSION: 1. No acute intracranial abnormalities. 2. 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 Electronically Signed   By: Amie Portlandavid  Ormond M.D.   On: 04/05/2016 20:19   Dg Chest Port 1 View  Result Date: 04/05/2016 CLINICAL DATA:  Per family Patient is altered. He has been feeling short of breath. Family noticed it started with him shaking and trying to catch his breath. HX ckd, dementia, diabetes, dvt, htn, pvd, cervical fusion EXAM: PORTABLE CHEST 1 VIEW COMPARISON:  11/13/2015.  CT, 12/28/2014 FINDINGS: Cardiac silhouette is normal in size. No mediastinal or hilar masses or convincing adenopathy. There are irregular interstitial type opacities at the left lung base which are new from the prior study.  Remainder of the lungs is clear. No convincing pleural effusion. No pneumothorax. Multiple calcifications are noted in the spleen, stable. Skeletal structures are demineralized but grossly intact. IMPRESSION: 1. Irregular interstitial type opacities in the left lower lung consistent with bronchial inflammation or infection or possible bronchopneumonia. No other evidence of acute cardiopulmonary disease. Electronically Signed   By: Amie Portlandavid  Ormond M.D.   On: 04/05/2016 19:13    Scheduled Meds: . atorvastatin  20 mg Oral Daily  . azithromycin  500 mg Intravenous Q24H  . cefTRIAXone (ROCEPHIN) IVPB 1 gram/50 mL D5W  1 g Intravenous Q24H  . donepezil  10 mg Oral QHS  . DULoxetine  60 mg Oral Daily  . insulin aspart  0-5 Units Subcutaneous QHS  . insulin aspart  0-9 Units Subcutaneous TID WC  . memantine  28 mg Oral Daily  . metoprolol tartrate  25 mg  Oral BID  . pantoprazole  40 mg Oral Daily  . rivaroxaban  20 mg Oral Daily  . senna  1 tablet Oral Daily  . timolol  1 drop Both Eyes BID   Assessment/Plan:   1. Clinical sepsis secondary to pneumonia. Patient also had lactic acidosis. Patient given IV fluids overnight. Patient started on Rocephin and Zithromax. 2. Acute kidney injury on chronic kidney disease stage III. Creatinine improved with IV fluid hydration 3. Dementia without behavioral disturbance. Continue Aricept and Cymbalta and Namenda.  4. Essential hypertension on metoprolol 5. Hyperlipidemia unspecified on atorvastatin 6. Type 2 diabetes mellitus on sliding scale while here 7. Glaucoma on timolol 8. History of DVT on Xarelto  Code Status:     Code Status Orders        Start     Ordered   04/05/16 2206  Full code  Continuous     04/05/16 2205    Code Status History    Date Active Date Inactive Code Status Order ID Comments User Context   11/10/2015 12:25 AM 11/15/2015  6:25 PM Full Code [id number]  Tonye Royalty, DO Inpatient   10/17/2015  8:18 PM 10/19/2015  3:09 PM  Full Code [id number]  Houston Siren, MD Inpatient   07/15/2015  8:30 AM 07/18/2015  9:00 PM Full Code [id number]  Auburn Bilberry, MD ED   12/28/2014  9:50 PM 12/31/2014  4:38 PM Full Code [id number]  Altamese Dilling, MD Inpatient   09/05/2014  4:09 AM 09/08/2014  6:11 PM Full Code [id number]  Crissie Figures, MD ED    Advance Directive Documentation   Flowsheet Row Most Recent Value  Type of Advance Directive  Living will  Pre-existing out of facility DNR order (yellow form or pink MOST form)  No data  "MOST" Form in Place?  No data     Family Communication: Spoke with the daughter on the phone. She informed me that the patient's wife who takes care of the patient is in a hospital in [place] and a she is going to be transferred to [place] soon. The daughter would like her father to be going to [facility] also  Disposition Plan: Rehabilitation after 3 nights stay  Antibiotics:  Rocephin  Zithromax  Time spent: 28 minutes  [name]  [employer]

## 2016-04-06 NOTE — Progress Notes (Signed)
Pharmacy Antibiotic Note  William Byrd is a 83 y.o. male admitted on 04/05/2016 with   sepsis secondary to PNA, now with positive blood cultures.  Pharmacy has been consulted for vancomycin dosing due to BCID results. Patient is also receiving ceftriaxone and Azithromycin   Plan: Ke: 0.042   VD: 54    T1/2: 17  Will start the patient on Vancomycin 1gm every 18 hours with 6 hour stack dosing. Trough level ordered prior to 5th dose. Calculated trough at Css is 17. Monitor renal function daily and adjust dose as needed.     Height: 5\' 11"  (180.3 cm) Weight: 169 lb (76.7 kg) IBW/kg (Calculated) : 75.3  Temp (24hrs), Avg:97.9 F (36.6 C), Min:97.2 F (36.2 C), Max:98.3 F (36.8 C)   Recent Labs Lab 04/05/16 1848 04/05/16 2153 04/06/16 0431  WBC 11.8*  --  10.8*  CREATININE 1.82*  --  1.38*  LATICACIDVEN 2.3* 2.3* 1.1    Estimated Creatinine Clearance: 44 mL/min (by C-G formula based on SCr of 1.38 mg/dL (H)).    Allergies  Allergen Reactions  . Fosinopril Other (See Comments)    Reaction: Hyperkalemia     Antimicrobials this admission: 2/24 azithromycin >>  2/24 ceftriaxone>>  2/25 Vancomycin   Dose adjustments this admission:  Microbiology results: 2/24 BCx: BCID: staph species, MecA (+) , 2 of 4 GPC  2/24  UCx: sent  2/24  MRSA PCR: negative   Thank you for allowing pharmacy to be a part of this patient's care.  Gardner CandleSheema M Azel Gumina, PharmD, BCPS Clinical Pharmacist 04/06/2016 4:58 PM

## 2016-04-06 NOTE — Progress Notes (Signed)
Physical Therapy Evaluation Patient Details Name: William Byrd MRN: [id number] DOB: 1932/01/31 Today's Date: 04/06/2016   History of Present Illness  Patient is an 83 y.o. male admitted on 24 FEB after experiencing altered mental status and SOB. Found to have sepsis d/t pneumonia. PMH includes DMII, HTN, CKD, HLD, and dementia.  Clinical Impression  Patient is a pleasant male admitted for above listed reasons. Patient oriented x1 with no family at bedside at time of evaluation. PLOF obtained from previous notes but unconfirmed. On evaluation, patient's oxygen saturation 92% on room air. Patient demonstrates need for maximal assistance to perform bed mobility and is unable to move from supine to sit due to decreased sitting balance/postural muscle strength. Because patient's baseline level of function cannot be confirmed but can be assumed that he doesn't likely have the level of care at home that would be necessary to safely assist him, it is believed that he will benefit from f/u at [facility] upon discharge. Patient will continue to benefit from skilled PT to improve functional balance/mobility and transfers during his hospital stay.   Follow Up Recommendations SNF    Equipment Recommendations  None recommended by PT    Recommendations for Other Services       Precautions / Restrictions Precautions Precautions: Fall Restrictions Weight Bearing Restrictions: No      Mobility  Bed Mobility Overal bed mobility: Needs Assistance Bed Mobility: Rolling;Supine to Sit Rolling: Max assist   Supine to sit: HOB elevated;Max assist     General bed mobility comments: Patient intermittently following commands to assess bed mobility. Moved LEs but then did not assist at all to complete transfer to EOB. Attempted rolling with maximal assistance from PT.  Transfers                 General transfer comment: Deferred due to assistance required for bed mobility.  Ambulation/Gait                 Stairs            Wheelchair Mobility    Modified Rankin (Stroke Patients Only)       Balance Overall balance assessment: Needs assistance Sitting-balance support: Bilateral upper extremity supported Sitting balance-Leahy Scale: Poor Sitting balance - Comments: Strong posterior lean towards back of bed when trying to perform supine to sit transfer                                     Pertinent Vitals/Pain Pain Assessment: No/denies pain    Home Living Family/patient expects to be discharged to:: Unsure                 Additional Comments: Patient oriented x1 at time of evaluation    Prior Function Level of Independence: Needs assistance         Comments: From prior notes, noted that patient uses manual WC primarily but able to ambulate short distances with RW. Relies on wife for most ADLs. (No one present to confirm at time of evaluation.)     Hand Dominance        Extremity/Trunk Assessment   Upper Extremity Assessment Upper Extremity Assessment: Generalized weakness    Lower Extremity Assessment Lower Extremity Assessment: Generalized weakness;Difficult to assess due to impaired cognition       Communication   Communication: No difficulties  Cognition Arousal/Alertness: Awake/alert Behavior During Therapy: WFL for tasks assessed/performed Overall Cognitive  Status: History of cognitive impairments - at baseline                 General Comments: Patient oriented x1    General Comments      Exercises     Assessment/Plan    PT Assessment Patient needs continued PT services  PT Problem List Decreased strength;Decreased range of motion;Decreased activity tolerance;Decreased balance;Decreased mobility;Decreased cognition;Decreased knowledge of use of DME;Decreased safety awareness;Cardiopulmonary status limiting activity       PT Treatment Interventions DME instruction;Gait training;Functional mobility  training;Therapeutic activities;Therapeutic exercise;Cognitive remediation;Patient/family education;Wheelchair mobility training;Balance training    PT Goals (Current goals can be found in the Care Plan section)  Acute Rehab PT Goals Patient Stated Goal: Unstated PT Goal Formulation: Patient unable to participate in goal setting Time For Goal Achievement: 04/20/16 Potential to Achieve Goals: Fair    Frequency Min 2X/week   Barriers to discharge Inaccessible home environment;Decreased caregiver support      Co-evaluation               End of Session   Activity Tolerance: Other (comment);No increased pain (Treatment limited by cognition) Patient left: in bed;with call bell/phone within reach;with bed alarm set Nurse Communication: Mobility status PT Visit Diagnosis: Muscle weakness (generalized) (M62.81);Difficulty in walking, not elsewhere classified (R26.2)         Time: 1241-1300 PT Time Calculation (min) (ACUTE ONLY): 19 min   Charges:   PT Evaluation $PT Eval Low Complexity: 1 Procedure     PT G Codes:         [name], PT, DPT 04/06/2016, 1:07 PM

## 2016-04-06 NOTE — Clinical Social Work Note (Signed)
CSW attempted to meet with patient and his family at bedside. The patient has AMS and cannot currently participate in an assessment. The patient had no family at bedside. CSW attempted to contact his spouse at the listed number with no answer and no ability to leave a voice mail. The CSW alerted the patient's RN to please call this CSW if family arrives so that an assessment can be completed.   Argentina PonderKaren Martha Manu Rubey, MSW, Theresia MajorsLCSWA [phone]

## 2016-04-06 NOTE — NC FL2 (Signed)
[place] MEDICAID FL2 LEVEL OF CARE SCREENING TOOL     IDENTIFICATION  Patient Name: William Byrd Birthdate: 1931/10/17 Sex: male Admission Date (Current Location): 04/05/2016  [place] and [place] Number:  Chiropodist and Address:  [facility], [address], [place], [place] [address]      Provider Number: [id number]  Attending Physician Name and Address:  [name], MD  Relative Name and Phone Number:       Current Level of Care: Hospital Recommended Level of Care: Skilled Nursing Facility Prior Approval Number:    Date Approved/Denied:   PASRR Number: [id number]  Discharge Plan: SNF    Current Diagnoses: Patient Active Problem List   Diagnosis Date Noted  . Sepsis due to pneumonia (HCC) 11/09/2015  . Pneumonia 10/17/2015  . CVA (cerebral infarction) 07/15/2015  . Dehydration 12/28/2014  . Acute on chronic renal failure (HCC) 12/28/2014  . Generalized weakness 12/28/2014  . Acute renal failure (ARF) (HCC) 12/28/2014  . Lower extremity weakness 09/05/2014  . DVT of lower extremity, bilateral (HCC) 09/05/2014  . DM2 (diabetes mellitus, type 2) (HCC) 09/05/2014  . HTN (hypertension) 09/05/2014  . Dementia 09/05/2014  . CKD (chronic kidney disease) stage 3, GFR 30-59 ml/min 09/05/2014  . HLD (hyperlipidemia) 09/05/2014  . Spinal stenosis 09/05/2014    Orientation RESPIRATION BLADDER Height & Weight     Self, Time, Situation, Place  O2 (2L o2 acute) Continent Weight: 169 lb (76.7 kg) Height:  5\' 11"  (180.3 cm)  BEHAVIORAL SYMPTOMS/MOOD NEUROLOGICAL BOWEL NUTRITION STATUS      Continent    AMBULATORY STATUS COMMUNICATION OF NEEDS Skin   Extensive Assist Verbally Normal                       Personal Care Assistance Level of Assistance  Bathing, Feeding, Dressing Bathing Assistance: Limited assistance Feeding assistance: Independent Dressing Assistance: Limited assistance     Functional Limitations Info   Sight, Hearing Sight Info: Impaired Hearing Info: Impaired      SPECIAL CARE FACTORS FREQUENCY  PT (By licensed PT)     PT Frequency: Up to 5X per day, 5 days per week              Contractures Contractures Info: Present    Additional Factors Info  Allergies   Allergies Info: Fosinopril           Current Medications (04/06/2016):  This is the current hospital active medication list Current Facility-Administered Medications  Medication Dose Route Frequency Provider Last Rate Last Dose  . acetaminophen (TYLENOL) tablet 650 mg  650 mg Oral Q6H PRN Oralia Manis, MD       Or  . acetaminophen (TYLENOL) suppository 650 mg  650 mg Rectal Q6H PRN Oralia Manis, MD      . atorvastatin (LIPITOR) tablet 20 mg  20 mg Oral Daily Oralia Manis, MD   20 mg at 04/06/16 1123  . azithromycin (ZITHROMAX) 500 mg in dextrose 5 % 250 mL IVPB  500 mg Intravenous Q24H Sheema M Hallaji, RPH      . benzonatate (TESSALON) capsule 200 mg  200 mg Oral TID PRN Oralia Manis, MD      . cefTRIAXone (ROCEPHIN) 1 g in dextrose 5 % 50 mL IVPB  1 g Intravenous Q24H Sheema M Hallaji, RPH      . docusate sodium (COLACE) capsule 100 mg  100 mg Oral Daily PRN Oralia Manis, MD      .  donepezil (ARICEPT) tablet 10 mg  10 mg Oral QHS Oralia Manisavid Willis, MD      . DULoxetine (CYMBALTA) DR capsule 60 mg  60 mg Oral Daily Oralia Manisavid Willis, MD   60 mg at 04/06/16 1123  . HYDROcodone-acetaminophen (NORCO/VICODIN) 5-325 MG per tablet 1 tablet  1 tablet Oral Q6H PRN Oralia Manisavid Willis, MD      . insulin aspart (novoLOG) injection 0-5 Units  0-5 Units Subcutaneous QHS Oralia Manisavid Willis, MD      . insulin aspart (novoLOG) injection 0-9 Units  0-9 Units Subcutaneous TID WC Oralia Manisavid Willis, MD   3 Units at 04/06/16 1231  . ipratropium-albuterol (DUONEB) 0.5-2.5 (3) MG/3ML nebulizer solution 3 mL  3 mL Nebulization Q4H PRN Oralia Manisavid Willis, MD      . memantine (NAMENDA XR) 24 hr capsule 28 mg  28 mg Oral Daily Oralia Manisavid Willis, MD   28 mg at 04/06/16 1123  .  metoprolol tartrate (LOPRESSOR) tablet 25 mg  25 mg Oral BID Oralia Manisavid Willis, MD   25 mg at 04/06/16 1124  . ondansetron (ZOFRAN) tablet 4 mg  4 mg Oral Q6H PRN Oralia Manisavid Willis, MD       Or  . ondansetron [facility](ZOFRAN) injection 4 mg  4 mg Intravenous Q6H PRN Oralia Manisavid Willis, MD      . pantoprazole (PROTONIX) EC tablet 40 mg  40 mg Oral Daily Oralia Manisavid Willis, MD   40 mg at 04/06/16 1123  . rivaroxaban (XARELTO) tablet 20 mg  20 mg Oral Daily Oralia Manisavid Willis, MD   20 mg at 04/06/16 1123  . senna (SENOKOT) tablet 8.6 mg  1 tablet Oral Daily Oralia Manisavid Willis, MD   8.6 mg at 04/06/16 1124  . timolol (TIMOPTIC) 0.5 % ophthalmic solution 1 drop  1 drop Both Eyes BID Oralia Manisavid Willis, MD   1 drop at 04/06/16 1000     Discharge Medications: Please see discharge summary for a list of discharge medications.  Relevant Imaging Results:  Relevant Lab Results:   Additional Information SS# [id number]  Judi CongKaren M Sabrinna Yearwood, LCSW

## 2016-04-06 NOTE — Clinical Social Work Note (Signed)
CSW has received multiple consults for discharge planning. CSW is following and will meet with the family when able.  Argentina PonderKaren Martha Shatera Rennert, MSW, Theresia MajorsLCSWA [phone]

## 2016-04-07 LAB — GLUCOSE, CAPILLARY
GLUCOSE-CAPILLARY: 226 mg/dL — AB (ref 65–99)
Glucose-Capillary: 170 mg/dL — ABNORMAL HIGH (ref 65–99)
Glucose-Capillary: 301 mg/dL — ABNORMAL HIGH (ref 65–99)
Glucose-Capillary: 221 mg/dL — ABNORMAL HIGH (ref 65–99)

## 2016-04-07 LAB — URINE CULTURE: CULTURE: NO GROWTH

## 2016-04-07 LAB — CREATININE, SERUM
Creatinine, Ser: 1.61 mg/dL — ABNORMAL HIGH (ref 0.61–1.24)
GFR calc Af Amer: 44 mL/min — ABNORMAL LOW (ref >60)
GFR, EST NON AFRICAN AMERICAN: 38 mL/min — AB (ref >60)

## 2016-04-07 MED ORDER — AZITHROMYCIN 250 MG PO TABS
500.0000 mg | ORAL_TABLET | Freq: Every day | ORAL | Status: DC
  Administered 2016-04-07: 500 mg via ORAL
  Filled 2016-04-07: qty 2
  Filled 2016-04-07: qty 1

## 2016-04-07 MED ORDER — RIVAROXABAN 20 MG PO TABS
20.0000 mg | ORAL_TABLET | Freq: Every day | ORAL | Status: DC
  Administered 2016-04-07: 20 mg via ORAL
  Filled 2016-04-07: qty 1

## 2016-04-07 NOTE — [place] FL2 (Signed)
[place] MEDICAID FL2 LEVEL OF CARE SCREENING TOOL     IDENTIFICATION  Patient Name: William Byrd Birthdate: 05/30/1931 Sex: male Admission Date (Current Location): 04/05/2016  [place]   Facility and Address:  [facility], [address], [place], [place] [address]      Provider Number: [id number]  Attending Physician Name and Address:  Ramonita LabAruna Onda Kattner, MD  Relative Name and Phone Number:       Current Level of Care: Hospital Recommended Level of Care: Skilled Nursing Facility Prior Approval Number:    Date Approved/Denied:   PASRR Number: [id number]  Discharge Plan: SNF    Current Diagnoses: Patient Active Problem List   Diagnosis Date Noted  . Sepsis due to pneumonia (HCC) 11/09/2015  . Pneumonia 10/17/2015  . CVA (cerebral infarction) 07/15/2015  . Dehydration 12/28/2014  . Acute on chronic renal failure (HCC) 12/28/2014  . Generalized weakness 12/28/2014  . Acute renal failure (ARF) (HCC) 12/28/2014  . Lower extremity weakness 09/05/2014  . DVT of lower extremity, bilateral (HCC) 09/05/2014  . DM2 (diabetes mellitus, type 2) (HCC) 09/05/2014  . HTN (hypertension) 09/05/2014  . Dementia 09/05/2014  . CKD (chronic kidney disease) stage 3, GFR 30-59 ml/min 09/05/2014  . HLD (hyperlipidemia) 09/05/2014  . Spinal stenosis 09/05/2014    Orientation RESPIRATION BLADDER Height & Weight     Self, Place  Normal Incontinent Weight: 169 lb (76.7 kg) Height:  5\' 11"  (180.3 cm)  BEHAVIORAL SYMPTOMS/MOOD NEUROLOGICAL BOWEL NUTRITION STATUS   (none)  (none) Incontinent Diet (heart healthy/carb modified)  AMBULATORY STATUS COMMUNICATION OF NEEDS Skin   Extensive Assist Verbally PU Stage and Appropriate Care                       Personal Care Assistance Level of Assistance  Bathing, Dressing Bathing Assistance: Limited assistance Feeding assistance: Limited assistance Dressing Assistance: Limited  assistance     Functional Limitations Info  Hearing Sight Info: Adequate Hearing Info: Impaired      SPECIAL CARE FACTORS FREQUENCY  PT (By licensed PT)     PT Frequency: Up to 5X per day, 5 days per week              Contractures Contractures Info: Not present    Additional Factors Info  Code Status Code Status Info: full Allergies Info: fosinopril           Current Medications (04/07/2016):  This is the current hospital active medication list Current Facility-Administered Medications  Medication Dose Route Frequency Provider Last Rate Last Dose  . acetaminophen (TYLENOL) tablet 650 mg  650 mg Oral Q6H PRN Oralia Manisavid Willis, MD       Or  . acetaminophen (TYLENOL) suppository 650 mg  650 mg Rectal Q6H PRN Oralia Manisavid Willis, MD      . atorvastatin (LIPITOR) tablet 20 mg  20 mg Oral Daily Oralia Manisavid Willis, MD   20 mg at 04/07/16 0957  . azithromycin (ZITHROMAX) 500 mg in dextrose 5 % 250 mL IVPB  500 mg Intravenous Q24H Sheema M Hallaji, RPH   500 mg at 04/06/16 1715  . benzonatate (TESSALON) capsule 200 mg  200 mg Oral TID PRN Oralia Manisavid Willis, MD      . cefTRIAXone (ROCEPHIN) 1 g in dextrose 5 % 50 mL IVPB  1 g Intravenous Q24H Sheema M Hallaji, RPH 100 mL/hr at 04/06/16 1723 1 g at 04/06/16 1723  . docusate sodium (COLACE) capsule 100 mg  100 mg Oral Daily PRN Oralia Manis, MD      . donepezil (ARICEPT) tablet 10 mg  10 mg Oral QHS Oralia Manis, MD   10 mg at 04/06/16 2114  . DULoxetine (CYMBALTA) DR capsule 60 mg  60 mg Oral Daily Oralia Manis, MD   60 mg at 04/07/16 0957  . HYDROcodone-acetaminophen (NORCO/VICODIN) 5-325 MG per tablet 1 tablet  1 tablet Oral Q6H PRN Oralia Manis, MD      . insulin aspart (novoLOG) injection 0-5 Units  0-5 Units Subcutaneous QHS Oralia Manis, MD   3 Units at 04/06/16 2114  . insulin aspart (novoLOG) injection 0-9 Units  0-9 Units Subcutaneous TID WC Oralia Manis, MD   2 Units at 04/07/16 [phone]  . ipratropium-albuterol (DUONEB) 0.5-2.5 (3) MG/3ML  nebulizer solution 3 mL  3 mL Nebulization Q4H PRN Oralia Manis, MD      . memantine (NAMENDA XR) 24 hr capsule 28 mg  28 mg Oral Daily Oralia Manis, MD   28 mg at 04/07/16 0957  . metoprolol tartrate (LOPRESSOR) tablet 25 mg  25 mg Oral BID Oralia Manis, MD   25 mg at 04/07/16 0957  . ondansetron (ZOFRAN) tablet 4 mg  4 mg Oral Q6H PRN Oralia Manis, MD       Or  . ondansetron [facility]) injection 4 mg  4 mg Intravenous Q6H PRN Oralia Manis, MD      . pantoprazole (PROTONIX) EC tablet 40 mg  40 mg Oral Daily Oralia Manis, MD   40 mg at 04/07/16 0957  . rivaroxaban (XARELTO) tablet 20 mg  20 mg Oral Q supper Ramonita Lab, MD      . senna (SENOKOT) tablet 8.6 mg  1 tablet Oral Daily Oralia Manis, MD   8.6 mg at 04/07/16 0957  . timolol (TIMOPTIC) 0.5 % ophthalmic solution 1 drop  1 drop Both Eyes BID Oralia Manis, MD   1 drop at 04/07/16 1007  . vancomycin (VANCOCIN) IVPB 1000 mg/200 mL premix  1,000 mg Intravenous Q18H Sheema M Hallaji, RPH   1,000 mg at 04/07/16 0116     Discharge Medications: Please see discharge summary for a list of discharge medications.  Relevant Imaging Results:  Relevant Lab Results:   Additional Information ss: [id number]  York Spaniel, LCSW

## 2016-04-07 NOTE — Progress Notes (Addendum)
Patient ID: William Byrd, male   DOB: [birth year], 83 y.o.   MRN: [id number]  [employer] PROGRESS NOTE  LARNIE HEART WUJ:[id number] DOB: 1931/06/25 DOA: 04/05/2016 PCP: Marisue Ivan, MD  HPI/Subjective: Patient with dementia  he says I don't know for every question  Objective: Vitals:   04/07/16 0726 04/07/16 0728  BP: (!) 169/65 (!) 166/65  Pulse: 77 75  Resp: 17   Temp: 98.8 F (37.1 C)     Filed Weights   04/05/16 1820  Weight: 76.7 kg (169 lb)    ROS: Review of Systems  Unable to perform ROS: Dementia  Respiratory: Positive for cough. Negative for shortness of breath.   Cardiovascular: Negative for chest pain.  Gastrointestinal: Negative for abdominal pain.  Musculoskeletal: Negative for joint pain.   Exam: Physical Exam  HENT:  Nose: No mucosal edema.  Mouth/Throat: No oropharyngeal exudate or posterior oropharyngeal edema.  Eyes: Conjunctivae, EOM and lids are normal. Pupils are equal, round, and reactive to light.  Neck: No JVD present. Carotid bruit is not present. No edema present. No thyroid mass and no thyromegaly present.  Cardiovascular: S1 normal and S2 normal.  Exam reveals no gallop.   No murmur heard. Pulses:      Dorsalis pedis pulses are 2+ on the right side, and 2+ on the left side.  Respiratory: No respiratory distress. He has no wheezes. He has rhonchi in the right lower field and the left lower field. He has no rales.  GI: Soft. Bowel sounds are normal. There is no tenderness.  Musculoskeletal:       Right ankle: He exhibits swelling.       Left ankle: He exhibits swelling.  Lymphadenopathy:    He has no cervical adenopathy.  Neurological: He is alert.  Moves his arms pretty good.  Skin: Skin is warm. No rash noted. Nails show no clubbing.  As per nursing stage II decubitus on buttock  Psychiatric: He has a normal mood and affect.      Data Reviewed: Basic Metabolic Panel:  Recent Labs Lab 04/05/16 1848 04/06/16 0431  04/07/16 0327  NA 133* 139  --   K 4.6 3.9  --   CL 99* 107  --   CO2 26 27  --   GLUCOSE 202* 144*  --   BUN 26* 23*  --   CREATININE 1.82* 1.38* 1.61*  CALCIUM 9.0 8.3*  --    Liver Function Tests:  Recent Labs Lab 04/05/16 1848  AST 27  ALT 18  ALKPHOS 66  BILITOT 0.5  PROT 7.9  ALBUMIN 3.4*   CBC:  Recent Labs Lab 04/05/16 1848 04/06/16 0431  WBC 11.8* 10.8*  HGB 11.4* 9.8*  HCT 33.8* 28.7*  MCV 92.4 92.8  PLT 255 221   Cardiac Enzymes:  Recent Labs Lab 04/05/16 1848  CKTOTAL 127  TROPONINI <0.03    CBG:  Recent Labs Lab 04/06/16 1129 04/06/16 1623 04/06/16 2107 04/07/16 0723 04/07/16 1130  GLUCAP 234* 208* 262* 170* 301*    Recent Results (from the past 240 hour(s))  Urine culture     Status: None   Collection Time: 04/05/16  6:48 PM  Result Value Ref Range Status   Specimen Description URINE, RANDOM  Final   Special Requests NONE  Final   Culture   Final    NO GROWTH Performed at [facility], [address] [place], [place] [address]    Report Status 04/07/2016 FINAL  Final  Blood Culture (routine x 2)     Status: None (Preliminary result)   Collection Time: 04/05/16  8:23 PM  Result Value Ref Range Status   Specimen Description BLOOD LEFT HAND  Final   Special Requests   Final    BOTTLES DRAWN AEROBIC AND ANAEROBIC 9CCAERO,6CCANA,BCAV VOLUME ADEQUATE   Culture  Setup Time   Final    GRAM POSITIVE COCCI ANAEROBIC BOTTLE ONLY CRITICAL RESULT CALLED TO, READ BACK BY AND VERIFIED WITH: Sheema Hallaji @ 1554 04/06/16 by [facility]    Culture GRAM POSITIVE COCCI  Final   Report Status PENDING  Incomplete  Blood Culture (routine x 2)     Status: None (Preliminary result)   Collection Time: 04/05/16  8:23 PM  Result Value Ref Range Status   Specimen Description BLOOD LEFT WRIST  Final   Special Requests   Final    BOTTLES DRAWN AEROBIC AND ANAEROBIC 4CCAERO,6CCANA,BCLV LOW VOLUME   Culture  Setup Time   Final    GRAM POSITIVE  COCCI AEROBIC BOTTLE ONLY CRITICAL VALUE NOTED.  VALUE IS CONSISTENT WITH PREVIOUSLY REPORTED AND CALLED VALUE.    Culture GRAM POSITIVE COCCI  Final   Report Status PENDING  Incomplete  Blood Culture ID Panel (Reflexed)     Status: Abnormal   Collection Time: 04/05/16  8:23 PM  Result Value Ref Range Status   Enterococcus species NOT DETECTED NOT DETECTED Final   Listeria monocytogenes NOT DETECTED NOT DETECTED Final   Staphylococcus species DETECTED (A) NOT DETECTED Final    Comment: Methicillin (oxacillin) resistant coagulase negative staphylococcus. Possible blood culture contaminant (unless isolated from more than one blood culture draw or clinical case suggests pathogenicity). No antibiotic treatment is indicated for blood  culture contaminants. CRITICAL RESULT CALLED TO, READ BACK BY AND VERIFIED WITH: Sheema Hallaji @ 1554 04/06/16 by [facility]    Staphylococcus aureus NOT DETECTED NOT DETECTED Final   Methicillin resistance DETECTED (A) NOT DETECTED Final    Comment: CRITICAL RESULT CALLED TO, READ BACK BY AND VERIFIED WITH: Sheema Hallaji @ 1554 04/06/16 by [facility]    Streptococcus species NOT DETECTED NOT DETECTED Final   Streptococcus agalactiae NOT DETECTED NOT DETECTED Final   Streptococcus pneumoniae NOT DETECTED NOT DETECTED Final   Streptococcus pyogenes NOT DETECTED NOT DETECTED Final   Acinetobacter baumannii NOT DETECTED NOT DETECTED Final   Enterobacteriaceae species NOT DETECTED NOT DETECTED Final   Enterobacter cloacae complex NOT DETECTED NOT DETECTED Final   Escherichia coli NOT DETECTED NOT DETECTED Final   Klebsiella oxytoca NOT DETECTED NOT DETECTED Final   Klebsiella pneumoniae NOT DETECTED NOT DETECTED Final   Proteus species NOT DETECTED NOT DETECTED Final   Serratia marcescens NOT DETECTED NOT DETECTED Final   Haemophilus influenzae NOT DETECTED NOT DETECTED Final   Neisseria meningitidis NOT DETECTED NOT DETECTED Final   Pseudomonas aeruginosa NOT DETECTED NOT  DETECTED Final   Candida albicans NOT DETECTED NOT DETECTED Final   Candida glabrata NOT DETECTED NOT DETECTED Final   Candida krusei NOT DETECTED NOT DETECTED Final   Candida parapsilosis NOT DETECTED NOT DETECTED Final   Candida tropicalis NOT DETECTED NOT DETECTED Final  MRSA PCR Screening     Status: None   Collection Time: 04/05/16  9:54 PM  Result Value Ref Range Status   MRSA by PCR NEGATIVE NEGATIVE Final    Comment:        The GeneXpert MRSA Assay (FDA approved for NASAL specimens only), is one component of a comprehensive MRSA colonization  surveillance program. It is not intended to diagnose MRSA infection nor to guide or monitor treatment for MRSA infections.      Studies: Ct Head Wo Contrast  Result Date: 04/05/2016 CLINICAL DATA:  Pt came to ED via EMS from home. Per family, pt is altered. Has been feeling sob. Per family, started with him shaking and trying to catch his breath EXAM: CT HEAD WITHOUT CONTRAST TECHNIQUE: Contiguous axial images were obtained from the base of the skull through the vertex without intravenous contrast. COMPARISON:  10/17/2015 FINDINGS: Brain: The ventricles are normal configuration. There is ventricular and sulcal enlargement reflecting moderate diffuse atrophy, greater than expected for age, but stable. No hydrocephalus. There are no parenchymal masses or mass effect. There is no evidence of a cortical infarct. Focal areas of hypoattenuation are noted in the pons consistent with old lacune infarcts. Patchy periventricular white matter hypoattenuation is noted consistent with mild chronic microvascular ischemic change. There are no extra-axial masses or abnormal fluid collections. There is no intracranial hemorrhage. Vascular: No hyperdense vessel or unexpected calcification. Skull: Normal. Negative for fracture or focal lesion. Sinuses/Orbits: Visualize globes and orbits are unremarkable. Visualized sinuses and mastoid air cells are clear. Other:  None. IMPRESSION: 1. No acute intracranial abnormalities. 2. 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 Electronically Signed   By: Amie Portland M.D.   On: 04/05/2016 20:19   Dg Chest Port 1 View  Result Date: 04/05/2016 CLINICAL DATA:  Per family Patient is altered. He has been feeling short of breath. Family noticed it started with him shaking and trying to catch his breath. HX ckd, dementia, diabetes, dvt, htn, pvd, cervical fusion EXAM: PORTABLE CHEST 1 VIEW COMPARISON:  11/13/2015.  CT, 12/28/2014 FINDINGS: Cardiac silhouette is normal in size. No mediastinal or hilar masses or convincing adenopathy. There are irregular interstitial type opacities at the left lung base which are new from the prior study. Remainder of the lungs is clear. No convincing pleural effusion. No pneumothorax. Multiple calcifications are noted in the spleen, stable. Skeletal structures are demineralized but grossly intact. IMPRESSION: 1. Irregular interstitial type opacities in the left lower lung consistent with bronchial inflammation or infection or possible bronchopneumonia. No other evidence of acute cardiopulmonary disease. Electronically Signed   By: Amie Portland M.D.   On: 04/05/2016 19:13    Scheduled Meds: . atorvastatin  20 mg Oral Daily  . azithromycin  500 mg Intravenous Q24H  . cefTRIAXone (ROCEPHIN) IVPB 1 gram/50 mL D5W  1 g Intravenous Q24H  . donepezil  10 mg Oral QHS  . DULoxetine  60 mg Oral Daily  . insulin aspart  0-5 Units Subcutaneous QHS  . insulin aspart  0-9 Units Subcutaneous TID WC  . memantine  28 mg Oral Daily  . metoprolol tartrate  25 mg Oral BID  . pantoprazole  40 mg Oral Daily  . rivaroxaban  20 mg Oral Q supper  . senna  1 tablet Oral Daily  . timolol  1 drop Both Eyes BID  . vancomycin  1,000 mg Intravenous Q18H   Assessment/Plan:   1. sepsis secondary to pneumonia/Bacteremia with MRSA. Patient was  treated with Rocephin and azithromycin will continue IV Rocephin and add vancomycin, discontinue azithromycin. Currently patient is afebrile and white count is trending down . Follow-up on the blood cultures and urine cultures which are pending at this time Lactic acid is normal at 1.1   2. Acute kidney  injury on chronic kidney disease stage III. Creatinine improved with IV fluid hydration 2. Dementia without behavioral disturbance. Continue Aricept and Cymbalta and Namenda.  3. Essential hypertension on metoprolol 4. Hyperlipidemia unspecified on atorvastatin 5. Type 2 diabetes mellitus on sliding scale while here 6. Glaucoma on timolol 7. History of DVT on Xarelto PT consult -skilled nursing facility  Code Status:     Code Status Orders        Start     Ordered   04/05/16 2206  Full code  Continuous     04/05/16 2205    Code Status History    Date Active Date Inactive Code Status Order ID Comments User Context   11/10/2015 12:25 AM 11/15/2015  6:25 PM Full Code [id number]  Tonye Royalty, DO Inpatient   10/17/2015  8:18 PM 10/19/2015  3:09 PM Full Code [id number]  Houston Siren, MD Inpatient   07/15/2015  8:30 AM 07/18/2015  9:00 PM Full Code [id number]  Auburn Bilberry, MD ED   12/28/2014  9:50 PM 12/31/2014  4:38 PM Full Code [id number]  Altamese Dilling, MD Inpatient   09/05/2014  4:09 AM 09/08/2014  6:11 PM Full Code [id number]  Crissie Figures, MD ED    Advance Directive Documentation   Flowsheet Row Most Recent Value  Type of Advance Directive  Living will  Pre-existing out of facility DNR order (yellow form or pink MOST form)  No data  "MOST" Form in Place?  No data     Family Communication: Spoke with the daughter on the phone, Plan of care updated. She informed me that the patient's wife who takes care of the patient is in a hospital in [place] and a she is going to be transferred to [place] soon. The daughter would like her father to be going to [facility] also   Disposition Plan: Rehabilitation after 3 nights stay  Antibiotics:  Time spent: 32 minutes  Jaeshawn Silvio, [employer]

## 2016-04-07 NOTE — Progress Notes (Signed)
Inpatient Diabetes Program Recommendations  AACE/ADA: New Consensus Statement on Inpatient Glycemic Control (2015)  Target Ranges:  Prepandial:   less than 140 mg/dL      Peak postprandial:   less than 180 mg/dL (1-2 hours)      Critically ill patients:  140 - 180 mg/dL   Results for William Byrd, William Byrd (MRN [id number]) as of 04/07/2016 10:40  Ref. Range 04/06/2016 08:03 04/06/2016 11:29 04/06/2016 16:23 04/06/2016 21:07 04/07/2016 07:23  Glucose-Capillary Latest Ref Range: 65 - 99 mg/dL 99 409234 (H) 811208 (H) 914262 (H) 170 (H)   Review of Glycemic Control  Diabetes history: DM2 Outpatient Diabetes medications: Levemir 28 units BID Current orders for Inpatient glycemic control: Novolog 0-9 units TID with meals, Novolog 0-5 units QHS  Inpatient Diabetes Program Recommendations: Insulin - Meal Coverage: Please consider ordering Novolog 4 units TID with meals for meal coverage if patient eats at least 50% of meals.  Thanks, Orlando PennerMarie Trindon Dorton, RN, MSN, CDE Diabetes Coordinator Inpatient Diabetes Program [phone] (Team Pager from 8am to 5pm)

## 2016-04-07 NOTE — Care Management Important Message (Signed)
Important Message  Patient Details  Name: William Byrd MRN: [id number] Date of Birth: 09/09/1931   Medicare Important Message Given:  Yes    Chapman FitchBOWEN, Shruthi Northrup T, RN 04/07/2016, 2:04 PM

## 2016-04-07 NOTE — Clinical Social Work Note (Signed)
Clinical Social Work Assessment  Patient Details  Name: William Byrd MRN: [id number] Date of Birth: [birth year]  Date of referral:  04/07/16               Reason for consult:  Facility Placement                Permission sought to share information with:  Facility Sport and exercise psychologist, Family Supports Permission granted to share information::  Yes, Verbal Permission Granted  Name::        Agency::     Relationship::     Contact Information:     Housing/Transportation Living arrangements for the past 2 months:  Single Family Home Source of Information:  Patient, Adult Children Patient Interpreter Needed:  None Criminal Activity/Legal Involvement Pertinent to Current Situation/Hospitalization:  No - Comment as needed Significant Relationships:  Adult Children, Spouse Lives with:  Spouse Do you feel safe going back to the place where you live?  Yes Need for family participation in patient care:  Yes (Comment)  Care giving concerns:  Patient lives with his wife who is currently at [facility] and going to rehab at [facility] at discharge.   Social Worker assessment / plan:  CSW met with patient and his daughter this morning in patient's hospital room. Patient is pleasantly confused and patient's daughter is somewhat fatigued from having to go back and [place] between hospitals. Patient stated he would comply with the rehab recommendation.Patient's daughter stated that she is in agreement with STR for her father and that ideally it would be good if [facility] offered due to patient's wife accepting a bed from [facility].  Bed search initiated today.  Employment status:  Retired Forensic scientist:  Medicare PT Recommendations:    Information / Referral to community resources:     Patient/Family's Response to care:  Patient and daughter expressed appreciation for CSW assistance.  Patient/Family's Understanding of and Emotional Response to Diagnosis, Current Treatment, and  Prognosis:  Patient is pleasantly confused and his short term memory is lacking. Patient's daughter states that patient keeps forgetting his wife is also in the hospital.  Emotional Assessment Appearance:  Appears stated age Attitude/Demeanor/Rapport:   (pleasant and cooperative) Affect (typically observed):  Accepting, Pleasant Orientation:  Oriented to Self Alcohol / Substance use:  Not Applicable Psych involvement (Current and /or in the community):  No (Comment)  Discharge Needs  Concerns to be addressed:  Care Coordination Readmission within the last 30 days:  No Current discharge risk:  None Barriers to Discharge:  No Barriers Identified   Shela Leff, LCSW 04/07/2016, 11:39 AM

## 2016-04-08 ENCOUNTER — Encounter
Admission: RE | Admit: 2016-04-08 | Discharge: 2016-04-08 | Disposition: A | Discharge status: Home / Self Care | Payer: Medicare Other | Source: Ambulatory Visit | Attending: Internal Medicine | Admitting: Internal Medicine

## 2016-04-08 LAB — CBC
HEMATOCRIT: 30.1 % — AB (ref 40.0–52.0)
Hemoglobin: 10.5 g/dL — ABNORMAL LOW (ref 13.0–18.0)
MCH: 31.9 pg (ref 26.0–34.0)
MCHC: 34.7 g/dL (ref 32.0–36.0)
MCV: 91.7 fL (ref 80.0–100.0)
Platelets: 243 10*3/uL (ref 150–440)
RBC: 3.28 MIL/uL — ABNORMAL LOW (ref 4.40–5.90)
RDW: 15.5 % — AB (ref 11.5–14.5)
WBC: 5.2 10*3/uL (ref 3.8–10.6)

## 2016-04-08 LAB — CULTURE, BLOOD (ROUTINE X 2): SPECIAL REQUESTS: ADEQUATE

## 2016-04-08 LAB — GLUCOSE, CAPILLARY
Glucose-Capillary: 183 mg/dL — ABNORMAL HIGH (ref 65–99)
Glucose-Capillary: 178 mg/dL — ABNORMAL HIGH (ref 65–99)
Glucose-Capillary: 213 mg/dL — ABNORMAL HIGH (ref 65–99)

## 2016-04-08 LAB — CREATININE, SERUM
Creatinine, Ser: 1.43 mg/dL — ABNORMAL HIGH (ref 0.61–1.24)
GFR calc non Af Amer: 44 mL/min — ABNORMAL LOW (ref >60)
GFR, EST AFRICAN AMERICAN: 51 mL/min — AB (ref >60)

## 2016-04-08 MED ORDER — INSULIN DETEMIR 100 UNIT/ML ~~LOC~~ SOLN: 10.0000 [IU] | Freq: Two times a day (BID) | SUBCUTANEOUS | 11 refills | Status: DC

## 2016-04-08 MED ORDER — INSULIN ASPART 100 UNIT/ML ~~LOC~~ SOLN: 4.0000 [IU] | Freq: Three times a day (TID) | SUBCUTANEOUS | Status: DC

## 2016-04-08 MED ORDER — AMOXICILLIN-POT CLAVULANATE 875-125 MG PO TABS: 1.0000 | ORAL_TABLET | Freq: Two times a day (BID) | ORAL | 0 refills | Status: DC

## 2016-04-08 MED ORDER — INSULIN ASPART 100 UNIT/ML ~~LOC~~ SOLN: 4.0000 [IU] | Freq: Three times a day (TID) | SUBCUTANEOUS | 11 refills | Status: DC

## 2016-04-08 MED ORDER — AMLODIPINE BESYLATE 5 MG PO TABS
5.0000 mg | ORAL_TABLET | Freq: Every day | ORAL | Status: DC
  Administered 2016-04-08: 5 mg via ORAL
  Filled 2016-04-08: qty 1

## 2016-04-08 NOTE — Progress Notes (Signed)
[facility] [place] [facility]. EMS was called for transport. IV was removed.

## 2016-04-08 NOTE — Discharge Summary (Signed)
[facility] NAME: William Byrd    MR#:  [id number]  DATE OF BIRTH:  10/29/1931  DATE OF ADMISSION:  04/05/2016 ADMITTING PHYSICIAN: Lance Coon, MD  DATE OF DISCHARGE: 04/08/2016  PRIMARY CARE PHYSICIAN: Dion Body, MD    ADMISSION DIAGNOSIS:  Community acquired pneumonia of left lower lobe of lung ([place]) [J18.1]  DISCHARGE DIAGNOSIS:  Principal Problem:   Sepsis due to pneumonia ([place]) Active Problems:   DM2 (diabetes mellitus, type 2) ([place])   HTN (hypertension)   Dementia   CKD (chronic kidney disease) stage 3, GFR 30-59 ml/min   HLD (hyperlipidemia)   SECONDARY DIAGNOSIS:   Past Medical History:  Diagnosis Date  . Arthritis   . Background retinopathy   . Cervicalgia   . Chronic kidney disease   . Chronic systolic CHF (congestive heart failure) ([place])   . Degeneration of lumbar or lumbosacral intervertebral disc   . Dementia   . Diabetes mellitus without complication ([place])   . DVT (deep venous thrombosis) ([place])   . Generalized weakness   . Glaucoma   . Hypercholesteremia   . Hypertension   . Long-term insulin use ([place])   . Peripheral polyneuropathy ([place])   . Peripheral vascular disease ([place])   . Spinal stenosis     HOSPITAL COURSE:   HPI : William Byrd  is a 83 y.o. male who presents with Confusion cough and shortness of breath.  Patient displayed confusion at home, weakness, cough. His daughter's report. He is unable to contribute much to his history of present illness. Here in the ED he was found to have pneumonia, and met sepsis criteria. Hospitalists were called for admission.  Hospital course   1. sepsis secondary to pneumonia  clinically improving according to the daughter at bedside Patient was treated with Rocephin and azithromycin will discharge patient home with by mouth Augmentin, discontinue azithromycin as patient has received 500 mg for 3 days . Currently patient is afebrile  and white count is trending down .  Follow-up on the blood cultures-coagulase-negative staph, probably a contaminant patient is clinically improving  urine cultures with no growth  Lactic acid is normal at 1.1   2. Acute kidney injury on chronic kidney disease stage III. Creatinine improved with IV fluid hydration 2. Dementia without behavioral disturbance. Continue Aricept and Cymbalta and Namenda.  3. Essential hypertension on metoprolol, amlodipine titrate as needed 4. Hyperlipidemia unspecified on atorvastatin 5. Type 2 diabetes mellitus with hypoglycemia  NovoLog 4 units 3 times a day with each meal if patient takes more than 50% of his meal. Decrease Levemir to 10 units twice a day 6. Glaucoma on timolol 7. History of DVT on Xarelto PT consult -skilled nursing facility; [facility] daughter is agreeable  8. 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 Fosinopril Other (See Comments)    Reaction: Hyperkalemia     DISCHARGE MEDICATIONS:   Current Discharge Medication List    START taking these medications   Details  amoxicillin-clavulanate (AUGMENTIN) 875-125 MG tablet Take 1 tablet by mouth 2 (two) times daily. Qty: 14 tablet, Refills: 0    insulin aspart (NOVOLOG) 100 UNIT/ML injection Inject 4 Units into the skin 3 (three) times daily with meals. Qty: 10 mL, Refills: 11  CONTINUE these medications which have CHANGED   Details  insulin detemir (LEVEMIR) 100 UNIT/ML injection Inject 0.1 mLs (10 Units total) into the skin 2 (two) times daily. Qty: 10 mL, Refills: 11      CONTINUE these medications which have NOT CHANGED   Details  acetaminophen (TYLENOL) 325 MG tablet Take 650 mg by mouth every 6 (six) hours as needed for mild pain, moderate pain, fever or headache.     albuterol (PROVENTIL HFA;VENTOLIN HFA) 108 (90 Base) MCG/ACT inhaler Inhale 2 puffs into the lungs every 4 (four) hours as needed for wheezing or shortness of breath.    amLODipine (NORVASC) 10 MG tablet Take 1 tablet (10 mg total) by mouth daily. Qty: 30 tablet, Refills: 0    atorvastatin (LIPITOR) 20 MG tablet Take 20 mg by mouth daily.    cholecalciferol (VITAMIN D) 1000 UNITS tablet Take 1,000 Units by mouth daily.    docusate sodium (STOOL SOFTENER) 100 MG capsule Take 100 mg by mouth daily as needed for mild constipation or moderate constipation.     donepezil (ARICEPT) 10 MG tablet Take 10 mg by mouth at bedtime.    DULoxetine (CYMBALTA) 60 MG capsule Take 60 mg by mouth daily.    finasteride (PROSCAR) 5 MG tablet Take 5 mg by mouth daily.    furosemide (LASIX) 20 MG tablet Take 40 mg by mouth daily. Take 2 tablets in the morning. Take an additional tablet 6 hours later.    HYDROcodone-acetaminophen (NORCO/VICODIN) 5-325 MG tablet Take 1 tablet by mouth every 6 (six) hours as needed for moderate pain. Qty: 20 tablet, Refills: 0    memantine (NAMENDA XR) 28 MG CP24 24 hr capsule Take 28 mg by mouth daily.    metoprolol tartrate (LOPRESSOR) 25 MG tablet Take 1 tablet (25 mg total) by mouth 2 (two) times daily.    pregabalin (LYRICA) 75 MG capsule Take 1 capsule (75 mg total) by mouth 2 (two) times daily. Qty: 30 capsule, Refills: 1    Saw Palmetto-Phytosterols (PROSTATE SR) 160-250 MG CAPS Take 1 capsule by mouth daily.    senna (SENOKOT) 8.6 MG TABS tablet Take 1 tablet (8.6 mg total) by mouth daily. Qty: 100 each, Refills: 0    timolol (TIMOPTIC) 0.5 % ophthalmic solution Place 1 drop into both eyes 2 (two) times daily.    benzonatate (TESSALON) 200 MG capsule Take 200 mg by mouth 3 (three) times daily as needed for cough.    bisacodyl (DULCOLAX) 5 MG EC tablet Take 1 tablet (5 mg total) by mouth daily as needed for moderate constipation. Qty: 30 tablet, Refills: 0     feeding supplement, ENSURE ENLIVE, (ENSURE ENLIVE) LIQD Take 237 mLs by mouth 2 (two) times daily between meals. Qty: 237 mL, Refills: 12    pantoprazole (PROTONIX) 40 MG tablet Take 1 tablet (40 mg total) by mouth daily. Qty: 30 tablet, Refills: 0    rivaroxaban (XARELTO) 20 MG TABS tablet Take 1 tablet by mouth daily.      STOP taking these medications     potassium chloride SA (K-DUR,KLOR-CON) 20 MEQ tablet          DISCHARGE INSTRUCTIONS:  Follow-up with primary care physician at the facility in 3-5 days Complete the antibiotic course    DIET:  Diabetic diet  DISCHARGE CONDITION:  Stable  ACTIVITY:  Activity as tolerated  OXYGEN:  Home Oxygen: No.   Oxygen Delivery: room air  DISCHARGE LOCATION:  nursing home   If you  experience worsening of your admission symptoms, develop shortness of breath, life threatening emergency, suicidal or homicidal thoughts you must seek medical attention immediately by calling 911 or calling your MD immediately  if symptoms less severe.  You Must read complete instructions/literature along with all the possible adverse reactions/side effects for all the Medicines you take and that have been prescribed to you. Take any new Medicines after you have completely understood and accpet all the possible adverse reactions/side effects.   Please note  You were cared for by a hospitalist during your hospital stay. If you have any questions about your discharge medications or the care you received while you were in the hospital after you are discharged, you can call the unit and asked to speak with the hospitalist on call if the hospitalist that took care of you is not available. Once you are discharged, your primary care physician will handle any further medical issues. Please note that NO REFILLS for any discharge medications will be authorized once you are discharged, as it is imperative that you return to your primary care physician (or  establish a relationship with a primary care physician if you do not have one) for your aftercare needs so that they can reassess your need for medications and monitor your lab values.     Today  Chief Complaint  Patient presents with  . Altered Mental Status  . Shortness of Breath   Patient is more awake and alert. Able to feed himself. Daughter at bedside, she thinks that that is almost at his baseline  ROS:  Unobtainable as the patient has baseline dementia  VITAL SIGNS:  Blood pressure (!) 160/73, pulse (!) 55, temperature 98.3 F (36.8 C), temperature source Oral, resp. rate 16, height 5' 11"  (1.803 m), weight 76.7 kg (169 lb), SpO2 100 %.  I/O:    Intake/Output Summary (Last 24 hours) at 04/08/16 1219 Last data filed at 04/07/16 1859  Gross per 24 hour  Intake              285 ml  Output                0 ml  Net              285 ml    PHYSICAL EXAMINATION:  GENERAL:  83 y.o.-year-old patient lying in the bed with no acute distress.  EYES: Pupils equal, round, reactive to light and accommodation. No scleral icterus. Extraocular muscles intact.  HEENT: Head atraumatic, normocephalic. Oropharynx and nasopharynx clear.  NECK:  Supple, no jugular venous distention. No thyroid enlargement, no tenderness.  LUNGS: Normal breath sounds bilaterally, no wheezing, rales,rhonchi or crepitation. No use of accessory muscles of respiration.  CARDIOVASCULAR: S1, S2 normal. No murmurs, rubs, or gallops.  ABDOMEN: Soft, non-tender, non-distended. Bowel sounds present. No organomegaly or mass.  EXTREMITIES: No pedal edema, cyanosis, or clubbing.  Neurologic-awake and alert. Has chronic baseline dementia   R:ecent Labs Lab 04/08/16 0310  WBC 5.2  HGB 10.5*  HCT 30.1*  PLT 243    Chemistries   Recent Labs Lab 04/05/16 1848 04/06/16 0431  04/08/16 0310  NA 133* 139  --   --   K 4.6 3.9  --   --   CL 99* 107  --   --   CO2 26 27  --   --   GLUCOSE 202* 144*  --   --    BUN 26* 23*  --   --   CREATININE  1.82* 1.38*  < > 1.43*  CALCIUM 9.0 8.3*  --   --   AST 27  --   --   --   ALT 18  --   --   --   ALKPHOS 66  --   --   --   BILITOT 0.5  --   --   --   < > = values in this interval not displayed.  Cardiac Enzymes  Recent Labs Lab 04/05/16 1848  TROPONINI <0.03    Microbiology Results  Results for orders placed or performed during the hospital encounter of 04/05/16  Urine culture     Status: None   Collection Time: 04/05/16  6:48 PM  Result Value Ref Range Status   Specimen Description URINE, RANDOM  Final   Special Requests NONE  Final   Culture   Final    NO GROWTH Performed at [facility], [place] [address] [place], [place] [address]    Report Status 04/07/2016 FINAL  Final  Blood Culture (routine x 2)     Status: Abnormal   Collection Time: 04/05/16  8:23 PM  Result Value Ref Range Status   Specimen Description BLOOD LEFT HAND  Final   Special Requests   Final    BOTTLES DRAWN AEROBIC AND ANAEROBIC 9CCAERO,6CCANA,BCAV VOLUME ADEQUATE   Culture  Setup Time   Final    GRAM POSITIVE COCCI ANAEROBIC BOTTLE ONLY CRITICAL RESULT CALLED TO, READ BACK BY AND VERIFIED WITH: Sheema Hallaji @ [phone] 04/06/16 by [facility]    Culture STAPHYLOCOCCUS SPECIES (COAGULASE NEGATIVE) (A)  Final   Report Status 04/08/2016 FINAL  Final   Organism ID, Bacteria STAPHYLOCOCCUS SPECIES (COAGULASE NEGATIVE)  Final      Susceptibility   Staphylococcus species (coagulase negative) - MIC*    CIPROFLOXACIN <=0.5 SENSITIVE Sensitive     ERYTHROMYCIN >=8 RESISTANT Resistant     GENTAMICIN <=0.5 SENSITIVE Sensitive     OXACILLIN RESISTANT Resistant     TETRACYCLINE <=1 SENSITIVE Sensitive     VANCOMYCIN 1 SENSITIVE Sensitive     TRIMETH/SULFA <=10 SENSITIVE Sensitive     CLINDAMYCIN 2 INTERMEDIATE Intermediate     RIFAMPIN <=0.5 SENSITIVE Sensitive     Inducible Clindamycin NEGATIVE Sensitive     * STAPHYLOCOCCUS SPECIES (COAGULASE NEGATIVE)  Blood Culture  (routine x 2)     Status: Abnormal   Collection Time: 04/05/16  8:23 PM  Result Value Ref Range Status   Specimen Description BLOOD LEFT WRIST  Final   Special Requests   Final    BOTTLES DRAWN AEROBIC AND ANAEROBIC 4CCAERO,6CCANA,BCLV LOW VOLUME   Culture  Setup Time   Final    GRAM POSITIVE COCCI AEROBIC BOTTLE ONLY CRITICAL VALUE NOTED.  VALUE IS CONSISTENT WITH PREVIOUSLY REPORTED AND CALLED VALUE.    Culture (A)  Final    STAPHYLOCOCCUS SPECIES (COAGULASE NEGATIVE) SUSCEPTIBILITIES PERFORMED ON PREVIOUS CULTURE WITHIN THE LAST 5 DAYS. Performed at [facility], [place] [address] [place], [place] [address]    Report Status 04/08/2016 FINAL  Final  Blood Culture ID Panel (Reflexed)     Status: Abnormal   Collection Time: 04/05/16  8:23 PM  Result Value Ref Range Status   Enterococcus species NOT DETECTED NOT DETECTED Final   Listeria monocytogenes NOT DETECTED NOT DETECTED Final   Staphylococcus species DETECTED (A) NOT DETECTED Final    Comment: Methicillin (oxacillin) resistant coagulase negative staphylococcus. Possible blood culture contaminant (unless isolated from more than one blood culture  draw or clinical case suggests pathogenicity). No antibiotic treatment is indicated for blood  culture contaminants. CRITICAL RESULT CALLED TO, READ BACK BY AND VERIFIED WITH: Sheema Hallaji @ [phone] 04/06/16 by [place]    Staphylococcus aureus NOT DETECTED NOT DETECTED Final   Methicillin resistance DETECTED (A) NOT DETECTED Final    Comment: CRITICAL RESULT CALLED TO, READ BACK BY AND VERIFIED WITH: Sheema Hallaji @ 2725 04/06/16 by [place]    Streptococcus species NOT DETECTED NOT DETECTED Final   Streptococcus agalactiae NOT DETECTED NOT DETECTED Final   Streptococcus pneumoniae NOT DETECTED NOT DETECTED Final   Streptococcus pyogenes NOT DETECTED NOT DETECTED Final   Acinetobacter baumannii NOT DETECTED NOT DETECTED Final   Enterobacteriaceae species NOT DETECTED NOT DETECTED Final    Enterobacter cloacae complex NOT DETECTED NOT DETECTED Final   Escherichia coli NOT DETECTED NOT DETECTED Final   Klebsiella oxytoca NOT DETECTED NOT DETECTED Final   Klebsiella pneumoniae NOT DETECTED NOT DETECTED Final   Proteus species NOT DETECTED NOT DETECTED Final   Serratia marcescens NOT DETECTED NOT DETECTED Final   Haemophilus influenzae NOT DETECTED NOT DETECTED Final   Neisseria meningitidis NOT DETECTED NOT DETECTED Final   Pseudomonas aeruginosa NOT DETECTED NOT DETECTED Final   Candida albicans NOT DETECTED NOT DETECTED Final   Candida glabrata NOT DETECTED NOT DETECTED Final   Candida krusei NOT DETECTED NOT DETECTED Final   Candida parapsilosis NOT DETECTED NOT DETECTED Final   Candida tropicalis NOT DETECTED NOT DETECTED Final  MRSA PCR Screening     Status: None   Collection Time: 04/05/16  9:54 PM  Result Value Ref Range Status   MRSA by PCR NEGATIVE NEGATIVE Final    Comment:        The GeneXpert MRSA Assay (FDA approved for NASAL specimens only), is one component of a comprehensive MRSA colonization surveillance program. It is not intended to diagnose MRSA infection nor to guide or monitor treatment for MRSA infections.     RADIOLOGY:  Ct Head Wo Contrast  Result Date: 04/05/2016 CLINICAL DATA:  Pt came to ED via EMS from home. Per family, pt is altered. Has been feeling sob. Per family, started with him shaking and trying to catch his breath EXAM: CT HEAD WITHOUT CONTRAST TECHNIQUE: Contiguous axial images were obtained from the base of the skull through the vertex without intravenous contrast. COMPARISON:  10/17/2015 FINDINGS: Brain: The ventricles are normal configuration. There is ventricular and sulcal enlargement reflecting moderate diffuse atrophy, greater than expected for age, but stable. No hydrocephalus. There are no parenchymal masses or mass effect. There is no evidence of a cortical infarct. Focal areas of hypoattenuation are noted in the pons  consistent with old lacune infarcts. Patchy periventricular white matter hypoattenuation is noted consistent with mild chronic microvascular ischemic change. There are no extra-axial masses or abnormal fluid collections. There is no intracranial hemorrhage. Vascular: No hyperdense vessel or unexpected calcification. Skull: Normal. Negative for fracture or focal lesion. Sinuses/Orbits: Visualize globes and orbits are unremarkable. Visualized sinuses and mastoid air cells are clear. Other: None. IMPRESSION: 1. No acute intracranial abnormalities. 2. 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 Electronically Signed   By: Lajean Manes M.D.   On: 04/05/2016 20:19   Dg Chest Port 1 View  Result Date: 04/05/2016 CLINICAL DATA:  Per family Patient is altered. He has been feeling short of breath. Family noticed it started with him shaking and trying to  catch his breath. HX ckd, dementia, diabetes, dvt, htn, pvd, cervical fusion EXAM: PORTABLE CHEST 1 VIEW COMPARISON:  11/13/2015.  CT, 12/28/2014 FINDINGS: Cardiac silhouette is normal in size. No mediastinal or hilar masses or convincing adenopathy. There are irregular interstitial type opacities at the left lung base which are new from the prior study. Remainder of the lungs is clear. No convincing pleural effusion. No pneumothorax. Multiple calcifications are noted in the spleen, stable. Skeletal structures are demineralized but grossly intact. IMPRESSION: 1. Irregular interstitial type opacities in the left lower lung consistent with bronchial inflammation or infection or possible bronchopneumonia. No other evidence of acute cardiopulmonary disease. Electronically Signed   By: Lajean Manes M.D.   On: 04/05/2016 19:13    EKG:   Orders placed or performed during the hospital encounter of 11/09/15  . ED EKG 12-Lead  . ED EKG 12-Lead  . EKG 12-Lead  . EKG 12-Lead  . EKG 12-Lead  . EKG  12-Lead      Management plans discussed with the patient and patient's daughter Ms.Tiwana  and they are in agreement.  CODE STATUS:     Code Status Orders        Start     Ordered   04/05/16 2206  Full code  Continuous     04/05/16 2205    Code Status History    Date Active Date Inactive Code Status Order ID Comments User Context   11/10/2015 12:25 AM 11/15/2015  6:25 PM Full Code [id number]  Harvie Bridge, DO Inpatient   10/17/2015  8:18 PM 10/19/2015  3:09 PM Full Code [id number]  Henreitta Leber, MD Inpatient   07/15/2015  8:30 AM 07/18/2015  9:00 PM Full Code [id number]  Dustin Flock, MD ED   12/28/2014  9:50 PM 12/31/2014  4:38 PM Full Code [id number]  Vaughan Basta, MD Inpatient   09/05/2014  4:09 AM 09/08/2014  6:11 PM Full Code [id number]  Juluis Mire, MD ED    Advance Directive Documentation   [place] Most Recent Value  Type of Advance Directive  Living will  Pre-existing out of facility DNR order (yellow form or pink MOST form)  No data  "MOST" Form in Place?  No data      TOTAL TIME TAKING CARE OF THIS PATIENT: 43  minutes.   Note: This dictation was prepared with Dragon dictation along with smaller phrase technology. Any transcriptional errors that result from this process are unintentional.   @M[facility] @  on 04/08/2016 at 12:19 PM  Between 7am to 6pm - Pager - [phone]  After 6pm go to [email or web address] - password [email or web address] [facility]  Office  [phone]  CC: Primary care physician; Dion Body, MD

## 2016-04-08 NOTE — Discharge Instructions (Signed)
° °  Community-Acquired Pneumonia, Adult Pneumonia is an infection of the lungs. One type of pneumonia can happen while a person is in a hospital. A different type can happen when a person is not in a hospital (community-acquired pneumonia). It is easy for this kind to spread from person to person. It can spread to you if you breathe near an infected person who coughs or sneezes. Some symptoms include:  A dry cough.  A wet (productive) cough.  Fever.  Sweating.  Chest pain. Follow these instructions at home:  Take over-the-counter and prescription medicines only as told by your doctor.  Only take cough medicine if you are losing sleep.  If you were prescribed an antibiotic medicine, take it as told by your doctor. Do not stop taking the antibiotic even if you start to feel better.  Sleep with your head and neck raised (elevated). You can do this by putting a few pillows under your head, or you can sleep in a recliner.  Do not use tobacco products. These include cigarettes, chewing tobacco, and e-cigarettes. If you need help quitting, ask your doctor.  Drink enough water to keep your pee (urine) clear or pale yellow. A shot (vaccine) can help prevent pneumonia. Shots are often suggested for:  People older than 81 years of age.  People older than 81 years of age:  Who are having cancer treatment.  Who have long-term (chronic) lung disease.  Who have problems with their body's defense system (immune system). You may also prevent pneumonia if you take these actions:  Get the flu (influenza) shot every year.  Go to the dentist as often as told.  Wash your hands often. If soap and water are not available, use hand sanitizer. Contact a doctor if:  You have a fever.  You lose sleep because your cough medicine does not help. Get help right away if:  You are short of breath and it gets worse.  You have more chest pain.  Your sickness gets worse. This is very serious  if:  You are an older adult.  Your body's defense system is weak.  You cough up blood. This information is not intended to replace advice given to you by your health care provider. Make sure you discuss any questions you have with your health care provider. Document Released: 07/16/2007 Document Revised: 07/05/2015 Document Reviewed: 05/24/2014 Elsevier Interactive Patient Education  2017 [employer] Follow-up with primary care physician at the facility in 3-5 days Complete the antibiotic course

## 2016-04-08 NOTE — Progress Notes (Signed)
MD paged regarding BP. Awaiting response.

## 2016-04-08 NOTE — Progress Notes (Signed)
Inpatient Diabetes Program Recommendations  AACE/ADA: New Consensus Statement on Inpatient Glycemic Control (2015)  Target Ranges:  Prepandial:   less than 140 mg/dL      Peak postprandial:   less than 180 mg/dL (1-2 hours)      Critically ill patients:  140 - 180 mg/dL   Lab Results  Component Value Date   GLUCAP 183 (H) 04/08/2016   HGBA1C 9.5 (H) 09/05/2014    Review of Glycemic Control  Results for William Byrd, [name] (MRN [id number]) as of 04/08/2016 09:32  Ref. Range 04/07/2016 07:23 04/07/2016 11:30 04/07/2016 16:26 04/07/2016 21:09 04/08/2016 07:32  Glucose-Capillary Latest Ref Range: 65 - 99 mg/dL 841170 (H) 324301 (H) 401221 (H) 226 (H) 183 (H)   Diabetes history: DM2 Outpatient Diabetes medications: Levemir 28 units BID  Current orders for Inpatient glycemic control: Novolog 0-9 units TID with meals, Novolog 0-5 units QHS  Inpatient Diabetes Program Recommendations: Insulin - Meal Coverage: Please consider ordering Novolog 4 units TID with meals for meal coverage if patient eats at least 50% of meals.   Per ADA recommendations "consider performing an A1C on all patients with diabetes or hyperglycemia admitted to the hospital if not performed in the prior 3 months".  Susette RacerJulie Nicolae Vasek, RN, BA, MHA, CDE Diabetes Coordinator Inpatient Diabetes Program  [phone] (Team Pager) [phone] [facility] Office) 04/08/2016 9:38 AM

## 2016-04-08 NOTE — Progress Notes (Signed)
RN attempted to reach pt's daughter to inform that pt was leaving, no answer.

## 2016-04-09 LAB — GLUCOSE, CAPILLARY
GLUCOSE-CAPILLARY: 224 mg/dL — AB (ref 65–99)
GLUCOSE-CAPILLARY: 220 mg/dL — AB (ref 65–99)
Glucose-Capillary: 297 mg/dL — ABNORMAL HIGH (ref 65–99)
Glucose-Capillary: 139 mg/dL — ABNORMAL HIGH (ref 65–99)

## 2016-04-10 ENCOUNTER — Encounter
Admission: RE | Admit: 2016-04-10 | Discharge: 2016-04-10 | Disposition: A | Discharge status: Home / Self Care | Payer: Medicare Other | Source: Ambulatory Visit | Attending: Internal Medicine | Admitting: Internal Medicine

## 2016-04-10 LAB — GLUCOSE, CAPILLARY
GLUCOSE-CAPILLARY: 245 mg/dL — AB (ref 65–99)
Glucose-Capillary: 245 mg/dL — ABNORMAL HIGH (ref 65–99)
Glucose-Capillary: 281 mg/dL — ABNORMAL HIGH (ref 65–99)
Glucose-Capillary: 322 mg/dL — ABNORMAL HIGH (ref 65–99)

## 2016-04-11 LAB — GLUCOSE, CAPILLARY
GLUCOSE-CAPILLARY: 62 mg/dL — AB (ref 65–99)
GLUCOSE-CAPILLARY: 138 mg/dL — AB (ref 65–99)
GLUCOSE-CAPILLARY: 297 mg/dL — AB (ref 65–99)
Glucose-Capillary: 341 mg/dL — ABNORMAL HIGH (ref 65–99)
Glucose-Capillary: 364 mg/dL — ABNORMAL HIGH (ref 65–99)
Glucose-Capillary: 334 mg/dL — ABNORMAL HIGH (ref 65–99)

## 2016-04-12 LAB — GLUCOSE, CAPILLARY
GLUCOSE-CAPILLARY: 208 mg/dL — AB (ref 65–99)
GLUCOSE-CAPILLARY: 211 mg/dL — AB (ref 65–99)
Glucose-Capillary: 298 mg/dL — ABNORMAL HIGH (ref 65–99)
Glucose-Capillary: 336 mg/dL — ABNORMAL HIGH (ref 65–99)

## 2016-04-13 LAB — GLUCOSE, CAPILLARY
Glucose-Capillary: 223 mg/dL — ABNORMAL HIGH (ref 65–99)
Glucose-Capillary: 255 mg/dL — ABNORMAL HIGH (ref 65–99)
Glucose-Capillary: 241 mg/dL — ABNORMAL HIGH (ref 65–99)
Glucose-Capillary: 264 mg/dL — ABNORMAL HIGH (ref 65–99)

## 2016-04-14 LAB — GLUCOSE, CAPILLARY
GLUCOSE-CAPILLARY: 105 mg/dL — AB (ref 65–99)
GLUCOSE-CAPILLARY: 177 mg/dL — AB (ref 65–99)
GLUCOSE-CAPILLARY: 240 mg/dL — AB (ref 65–99)
GLUCOSE-CAPILLARY: 333 mg/dL — AB (ref 65–99)

## 2016-04-16 LAB — GLUCOSE, CAPILLARY
GLUCOSE-CAPILLARY: 151 mg/dL — AB (ref 65–99)
GLUCOSE-CAPILLARY: 295 mg/dL — AB (ref 65–99)
GLUCOSE-CAPILLARY: 289 mg/dL — AB (ref 65–99)
GLUCOSE-CAPILLARY: 441 mg/dL — AB (ref 65–99)
Glucose-Capillary: 305 mg/dL — ABNORMAL HIGH (ref 65–99)
Glucose-Capillary: 292 mg/dL — ABNORMAL HIGH (ref 65–99)
Glucose-Capillary: 314 mg/dL — ABNORMAL HIGH (ref 65–99)
Glucose-Capillary: 376 mg/dL — ABNORMAL HIGH (ref 65–99)

## 2016-04-17 LAB — GLUCOSE, CAPILLARY
GLUCOSE-CAPILLARY: 223 mg/dL — AB (ref 65–99)
Glucose-Capillary: 174 mg/dL — ABNORMAL HIGH (ref 65–99)
Glucose-Capillary: 314 mg/dL — ABNORMAL HIGH (ref 65–99)
Glucose-Capillary: 412 mg/dL — ABNORMAL HIGH (ref 65–99)

## 2016-04-18 LAB — GLUCOSE, CAPILLARY
GLUCOSE-CAPILLARY: 263 mg/dL — AB (ref 65–99)
Glucose-Capillary: 234 mg/dL — ABNORMAL HIGH (ref 65–99)
Glucose-Capillary: 296 mg/dL — ABNORMAL HIGH (ref 65–99)
Glucose-Capillary: 373 mg/dL — ABNORMAL HIGH (ref 65–99)

## 2016-04-19 LAB — GLUCOSE, CAPILLARY: Glucose-Capillary: 391 mg/dL — ABNORMAL HIGH (ref 65–99)

## 2016-04-20 LAB — GLUCOSE, CAPILLARY
GLUCOSE-CAPILLARY: 382 mg/dL — AB (ref 65–99)
GLUCOSE-CAPILLARY: 418 mg/dL — AB (ref 65–99)
Glucose-Capillary: 236 mg/dL — ABNORMAL HIGH (ref 65–99)
Glucose-Capillary: 265 mg/dL — ABNORMAL HIGH (ref 65–99)
Glucose-Capillary: 280 mg/dL — ABNORMAL HIGH (ref 65–99)

## 2016-04-21 LAB — GLUCOSE, CAPILLARY
GLUCOSE-CAPILLARY: 297 mg/dL — AB (ref 65–99)
GLUCOSE-CAPILLARY: 151 mg/dL — AB (ref 65–99)
GLUCOSE-CAPILLARY: 417 mg/dL — AB (ref 65–99)
GLUCOSE-CAPILLARY: 505 mg/dL — AB (ref 65–99)
GLUCOSE-CAPILLARY: 429 mg/dL — AB (ref 65–99)
Glucose-Capillary: 252 mg/dL — ABNORMAL HIGH (ref 65–99)
Glucose-Capillary: 519 mg/dL (ref 65–99)

## 2016-04-22 LAB — GLUCOSE, CAPILLARY
GLUCOSE-CAPILLARY: 403 mg/dL — AB (ref 65–99)
GLUCOSE-CAPILLARY: 438 mg/dL — AB (ref 65–99)
Glucose-Capillary: 359 mg/dL — ABNORMAL HIGH (ref 65–99)
Glucose-Capillary: 185 mg/dL — ABNORMAL HIGH (ref 65–99)
Glucose-Capillary: 280 mg/dL — ABNORMAL HIGH (ref 65–99)

## 2016-04-23 LAB — GLUCOSE, CAPILLARY: Glucose-Capillary: 433 mg/dL — ABNORMAL HIGH (ref 65–99)

## 2016-04-24 ENCOUNTER — Other Ambulatory Visit
Admission: RE | Admit: 2016-04-24 | Discharge: 2016-04-24 | Disposition: A | Discharge status: Home / Self Care | Payer: Medicare Other | Source: Skilled Nursing Facility | Attending: Internal Medicine | Admitting: Internal Medicine

## 2016-04-24 DIAGNOSIS — R739 Hyperglycemia, unspecified: Secondary | ICD-10-CM | POA: Diagnosis present

## 2016-04-24 DIAGNOSIS — R410 Disorientation, unspecified: Secondary | ICD-10-CM | POA: Diagnosis present

## 2016-04-24 LAB — CBC WITH DIFFERENTIAL/PLATELET
BASOS ABS: 0 10*3/uL (ref 0–0.1)
BASOS PCT: 0 %
Eosinophils Absolute: 0.1 10*3/uL (ref 0–0.7)
Eosinophils Relative: 1 %
HCT: 30.2 % — ABNORMAL LOW (ref 40.0–52.0)
Hemoglobin: 10.3 g/dL — ABNORMAL LOW (ref 13.0–18.0)
Lymphocytes Relative: 18 %
Lymphs Abs: 1.2 10*3/uL (ref 1.0–3.6)
MCH: 31.6 pg (ref 26.0–34.0)
MCHC: 34.3 g/dL (ref 32.0–36.0)
MCV: 92.4 fL (ref 80.0–100.0)
Monocytes Absolute: 0.6 10*3/uL (ref 0.2–1.0)
Monocytes Relative: 8 %
NEUTROS ABS: 4.9 10*3/uL (ref 1.4–6.5)
NEUTROS PCT: 73 %
Platelets: 322 10*3/uL (ref 150–440)
RBC: 3.27 MIL/uL — ABNORMAL LOW (ref 4.40–5.90)
RDW: 15.3 % — ABNORMAL HIGH (ref 11.5–14.5)
WBC: 6.8 10*3/uL (ref 3.8–10.6)

## 2016-04-24 LAB — COMPREHENSIVE METABOLIC PANEL
ALBUMIN: 3 g/dL — AB (ref 3.5–5.0)
ALK PHOS: 92 U/L (ref 38–126)
ALT: 33 U/L (ref 17–63)
AST: 24 U/L (ref 15–41)
Anion gap: 9 (ref 5–15)
BUN: 41 mg/dL — ABNORMAL HIGH (ref 6–20)
CALCIUM: 9.1 mg/dL (ref 8.9–10.3)
CO2: 26 mmol/L (ref 22–32)
CREATININE: 1.83 mg/dL — AB (ref 0.61–1.24)
Chloride: 98 mmol/L — ABNORMAL LOW (ref 101–111)
GFR calc Af Amer: 38 mL/min — ABNORMAL LOW (ref >60)
GFR calc non Af Amer: 33 mL/min — ABNORMAL LOW (ref >60)
GLUCOSE: 408 mg/dL — AB (ref 65–99)
Potassium: 4.9 mmol/L (ref 3.5–5.1)
SODIUM: 133 mmol/L — AB (ref 135–145)
Total Bilirubin: 0.2 mg/dL — ABNORMAL LOW (ref 0.3–1.2)
Total Protein: 7.8 g/dL (ref 6.5–8.1)

## 2016-04-24 LAB — GLUCOSE, CAPILLARY
GLUCOSE-CAPILLARY: 422 mg/dL — AB (ref 65–99)
GLUCOSE-CAPILLARY: 452 mg/dL — AB (ref 65–99)
GLUCOSE-CAPILLARY: 269 mg/dL — AB (ref 65–99)
Glucose-Capillary: 313 mg/dL — ABNORMAL HIGH (ref 65–99)
Glucose-Capillary: 472 mg/dL — ABNORMAL HIGH (ref 65–99)
Glucose-Capillary: 310 mg/dL — ABNORMAL HIGH (ref 65–99)
Glucose-Capillary: 331 mg/dL — ABNORMAL HIGH (ref 65–99)
Glucose-Capillary: 437 mg/dL — ABNORMAL HIGH (ref 65–99)
Glucose-Capillary: 403 mg/dL — ABNORMAL HIGH (ref 65–99)

## 2016-04-24 LAB — URINALYSIS, COMPLETE (UACMP) WITH MICROSCOPIC
BACTERIA UA: NONE SEEN
Bilirubin Urine: NEGATIVE
Glucose, UA: >=500 mg/dL — AB
Hgb urine dipstick: NEGATIVE
KETONES UR: NEGATIVE mg/dL
Leukocytes, UA: NEGATIVE
Nitrite: NEGATIVE
Protein, ur: 100 mg/dL — AB
Specific Gravity, Urine: 1.014 (ref 1.005–1.030)
pH: 5 (ref 5.0–8.0)

## 2016-04-26 LAB — URINE CULTURE: Culture: NO GROWTH

## 2016-04-28 LAB — GLUCOSE, CAPILLARY
GLUCOSE-CAPILLARY: 274 mg/dL — AB (ref 65–99)
GLUCOSE-CAPILLARY: 38 mg/dL — AB (ref 65–99)
GLUCOSE-CAPILLARY: 212 mg/dL — AB (ref 65–99)
GLUCOSE-CAPILLARY: 326 mg/dL — AB (ref 65–99)
GLUCOSE-CAPILLARY: 230 mg/dL — AB (ref 65–99)
Glucose-Capillary: 209 mg/dL — ABNORMAL HIGH (ref 65–99)
Glucose-Capillary: 316 mg/dL — ABNORMAL HIGH (ref 65–99)
Glucose-Capillary: 308 mg/dL — ABNORMAL HIGH (ref 65–99)
Glucose-Capillary: 92 mg/dL (ref 65–99)
Glucose-Capillary: 349 mg/dL — ABNORMAL HIGH (ref 65–99)
Glucose-Capillary: 96 mg/dL (ref 65–99)
Glucose-Capillary: 158 mg/dL — ABNORMAL HIGH (ref 65–99)
Glucose-Capillary: 219 mg/dL — ABNORMAL HIGH (ref 65–99)

## 2016-04-29 LAB — GLUCOSE, CAPILLARY
GLUCOSE-CAPILLARY: 81 mg/dL (ref 65–99)
GLUCOSE-CAPILLARY: 251 mg/dL — AB (ref 65–99)
Glucose-Capillary: 47 mg/dL — ABNORMAL LOW (ref 65–99)
Glucose-Capillary: 216 mg/dL — ABNORMAL HIGH (ref 65–99)
Glucose-Capillary: 297 mg/dL — ABNORMAL HIGH (ref 65–99)
Glucose-Capillary: 112 mg/dL — ABNORMAL HIGH (ref 65–99)
Glucose-Capillary: 183 mg/dL — ABNORMAL HIGH (ref 65–99)
Glucose-Capillary: 170 mg/dL — ABNORMAL HIGH (ref 65–99)

## 2016-04-30 LAB — GLUCOSE, CAPILLARY: Glucose-Capillary: 295 mg/dL — ABNORMAL HIGH (ref 65–99)

## 2016-05-01 LAB — GLUCOSE, CAPILLARY
GLUCOSE-CAPILLARY: 29 mg/dL — AB (ref 65–99)
Glucose-Capillary: 167 mg/dL — ABNORMAL HIGH (ref 65–99)
Glucose-Capillary: 271 mg/dL — ABNORMAL HIGH (ref 65–99)
Glucose-Capillary: 78 mg/dL (ref 65–99)

## 2016-05-02 LAB — GLUCOSE, CAPILLARY
Glucose-Capillary: 261 mg/dL — ABNORMAL HIGH (ref 65–99)
Glucose-Capillary: 216 mg/dL — ABNORMAL HIGH (ref 65–99)
Glucose-Capillary: 201 mg/dL — ABNORMAL HIGH (ref 65–99)

## 2016-05-05 LAB — GLUCOSE, CAPILLARY
GLUCOSE-CAPILLARY: 272 mg/dL — AB (ref 65–99)
GLUCOSE-CAPILLARY: 88 mg/dL (ref 65–99)
GLUCOSE-CAPILLARY: 104 mg/dL — AB (ref 65–99)
GLUCOSE-CAPILLARY: 106 mg/dL — AB (ref 65–99)
GLUCOSE-CAPILLARY: 102 mg/dL — AB (ref 65–99)
Glucose-Capillary: 294 mg/dL — ABNORMAL HIGH (ref 65–99)
Glucose-Capillary: 61 mg/dL — ABNORMAL LOW (ref 65–99)
Glucose-Capillary: 161 mg/dL — ABNORMAL HIGH (ref 65–99)
Glucose-Capillary: 43 mg/dL — CL (ref 65–99)
Glucose-Capillary: 75 mg/dL (ref 65–99)
Glucose-Capillary: 299 mg/dL — ABNORMAL HIGH (ref 65–99)
Glucose-Capillary: 128 mg/dL — ABNORMAL HIGH (ref 65–99)
Glucose-Capillary: 242 mg/dL — ABNORMAL HIGH (ref 65–99)

## 2016-05-06 LAB — GLUCOSE, CAPILLARY
GLUCOSE-CAPILLARY: 275 mg/dL — AB (ref 65–99)
GLUCOSE-CAPILLARY: 98 mg/dL (ref 65–99)
Glucose-Capillary: 264 mg/dL — ABNORMAL HIGH (ref 65–99)
Glucose-Capillary: 109 mg/dL — ABNORMAL HIGH (ref 65–99)
Glucose-Capillary: 179 mg/dL — ABNORMAL HIGH (ref 65–99)

## 2016-05-07 LAB — GLUCOSE, CAPILLARY
Glucose-Capillary: 177 mg/dL — ABNORMAL HIGH (ref 65–99)
Glucose-Capillary: 262 mg/dL — ABNORMAL HIGH (ref 65–99)
Glucose-Capillary: 192 mg/dL — ABNORMAL HIGH (ref 65–99)
Glucose-Capillary: 123 mg/dL — ABNORMAL HIGH (ref 65–99)

## 2016-05-08 ENCOUNTER — Non-Acute Institutional Stay (SKILLED_NURSING_FACILITY): Payer: Medicare Other | Admitting: Gerontology

## 2016-05-08 DIAGNOSIS — N183 Chronic kidney disease, stage 3 (moderate): Secondary | ICD-10-CM

## 2016-05-08 DIAGNOSIS — Z794 Long term (current) use of insulin: Secondary | ICD-10-CM

## 2016-05-08 DIAGNOSIS — J189 Pneumonia, unspecified organism: Secondary | ICD-10-CM

## 2016-05-08 DIAGNOSIS — E1122 Type 2 diabetes mellitus with diabetic chronic kidney disease: Secondary | ICD-10-CM

## 2016-05-08 LAB — GLUCOSE, CAPILLARY
GLUCOSE-CAPILLARY: 180 mg/dL — AB (ref 65–99)
GLUCOSE-CAPILLARY: 44 mg/dL — AB (ref 65–99)
Glucose-Capillary: 55 mg/dL — ABNORMAL LOW (ref 65–99)

## 2016-05-09 LAB — GLUCOSE, CAPILLARY
GLUCOSE-CAPILLARY: 102 mg/dL — AB (ref 65–99)
GLUCOSE-CAPILLARY: 160 mg/dL — AB (ref 65–99)
GLUCOSE-CAPILLARY: 159 mg/dL — AB (ref 65–99)
GLUCOSE-CAPILLARY: 210 mg/dL — AB (ref 65–99)
Glucose-Capillary: 154 mg/dL — ABNORMAL HIGH (ref 65–99)
Glucose-Capillary: 252 mg/dL — ABNORMAL HIGH (ref 65–99)
Glucose-Capillary: 228 mg/dL — ABNORMAL HIGH (ref 65–99)
Glucose-Capillary: 106 mg/dL — ABNORMAL HIGH (ref 65–99)

## 2016-05-11 ENCOUNTER — Encounter
Admission: RE | Admit: 2016-05-11 | Discharge: 2016-05-11 | Disposition: A | Discharge status: Home / Self Care | Payer: Medicare Other | Source: Ambulatory Visit | Attending: Internal Medicine | Admitting: Internal Medicine

## 2016-05-11 LAB — GLUCOSE, CAPILLARY: Glucose-Capillary: 188 mg/dL — ABNORMAL HIGH (ref 65–99)

## 2016-05-12 LAB — GLUCOSE, CAPILLARY
GLUCOSE-CAPILLARY: 211 mg/dL — AB (ref 65–99)
GLUCOSE-CAPILLARY: 200 mg/dL — AB (ref 65–99)
GLUCOSE-CAPILLARY: 74 mg/dL (ref 65–99)
GLUCOSE-CAPILLARY: 168 mg/dL — AB (ref 65–99)
GLUCOSE-CAPILLARY: 275 mg/dL — AB (ref 65–99)
Glucose-Capillary: 82 mg/dL (ref 65–99)

## 2016-05-13 LAB — GLUCOSE, CAPILLARY
GLUCOSE-CAPILLARY: 139 mg/dL — AB (ref 65–99)
GLUCOSE-CAPILLARY: 231 mg/dL — AB (ref 65–99)
GLUCOSE-CAPILLARY: 229 mg/dL — AB (ref 65–99)
GLUCOSE-CAPILLARY: 218 mg/dL — AB (ref 65–99)

## 2016-05-14 LAB — GLUCOSE, CAPILLARY
GLUCOSE-CAPILLARY: 126 mg/dL — AB (ref 65–99)
GLUCOSE-CAPILLARY: 209 mg/dL — AB (ref 65–99)
GLUCOSE-CAPILLARY: 172 mg/dL — AB (ref 65–99)
Glucose-Capillary: 253 mg/dL — ABNORMAL HIGH (ref 65–99)
Glucose-Capillary: 239 mg/dL — ABNORMAL HIGH (ref 65–99)

## 2016-05-15 LAB — GLUCOSE, CAPILLARY
GLUCOSE-CAPILLARY: 173 mg/dL — AB (ref 65–99)
Glucose-Capillary: 233 mg/dL — ABNORMAL HIGH (ref 65–99)
Glucose-Capillary: 277 mg/dL — ABNORMAL HIGH (ref 65–99)

## 2016-05-16 LAB — GLUCOSE, CAPILLARY
GLUCOSE-CAPILLARY: 178 mg/dL — AB (ref 65–99)
Glucose-Capillary: 295 mg/dL — ABNORMAL HIGH (ref 65–99)
Glucose-Capillary: 291 mg/dL — ABNORMAL HIGH (ref 65–99)
Glucose-Capillary: 266 mg/dL — ABNORMAL HIGH (ref 65–99)

## 2016-05-17 LAB — GLUCOSE, CAPILLARY
GLUCOSE-CAPILLARY: 153 mg/dL — AB (ref 65–99)
Glucose-Capillary: 166 mg/dL — ABNORMAL HIGH (ref 65–99)
Glucose-Capillary: 186 mg/dL — ABNORMAL HIGH (ref 65–99)
Glucose-Capillary: 236 mg/dL — ABNORMAL HIGH (ref 65–99)

## 2016-05-18 LAB — GLUCOSE, CAPILLARY
GLUCOSE-CAPILLARY: 137 mg/dL — AB (ref 65–99)
GLUCOSE-CAPILLARY: 162 mg/dL — AB (ref 65–99)
GLUCOSE-CAPILLARY: 276 mg/dL — AB (ref 65–99)
Glucose-Capillary: 263 mg/dL — ABNORMAL HIGH (ref 65–99)

## 2016-05-18 NOTE — Progress Notes (Signed)
Location:      Place of Service:  SNF (31) Provider:  Lorenso Quarry, NP-C  Marisue Ivan, MD  Patient Care Team: Marisue Ivan, MD as PCP - General (Family Medicine)  Extended Emergency Contact Information Primary Emergency Contact: [name] Address: [phone]          [place], [place] [address] [place] Home Phone: [phone] Relation: Spouse  Code Status:  full Goals of care: Advanced Directive information Advanced Directives 04/06/2016  Does Patient Have a Medical Advance Directive? Yes  Type of Advance Directive Living will  Does patient want to make changes to medical advance directive? No - Patient declined  Copy of Healthcare Power of Attorney in Chart? -  Would patient like information on creating a medical advance directive? No - Patient declined     Chief Complaint  Patient presents with  . Follow-up    HPI:  Pt is a 83 y.o. male seen today for medical management of chronic diseases. Pt was admitted to the facility for rehab following hospitalization for Pneumonia. Pt has been participating in PT/OT for deconditioning. Pt has a diagnosis of diabetes. Pt's blood glucose has been fairly stable. He has had several low readings, so I will decrease the amount of Levemir received BID. Pt is confused, diagnosis of dementia. Unable to obtain complete ROS. VSS. No other complaints.    Past Medical History:  Diagnosis Date  . Arthritis   . Background retinopathy   . Cervicalgia   . Chronic kidney disease   . Chronic systolic CHF (congestive heart failure) (HCC)   . Degeneration of lumbar or lumbosacral intervertebral disc   . Dementia   . Diabetes mellitus without complication (HCC)   . DVT (deep venous thrombosis) (HCC)   . Generalized weakness   . Glaucoma   . Hypercholesteremia   . Hypertension   . Long-term insulin use (HCC)   . Peripheral polyneuropathy (HCC)   . Peripheral vascular disease (HCC)   . Spinal stenosis      Past Surgical History:  Procedure Laterality Date  . c-spine fusion N/A     Allergies  Allergen Reactions  . Fosinopril Other (See Comments)    Reaction: Hyperkalemia     Allergies as of 05/08/2016      Reactions   Fosinopril Other (See Comments)   Reaction: Hyperkalemia       Medication List       Accurate as of 05/08/16 11:59 PM. Always use your most recent med list.          acetaminophen 325 MG tablet Commonly known as:  TYLENOL Take 650 mg by mouth every 6 (six) hours as needed for mild pain, moderate pain, fever or headache.   albuterol 108 (90 Base) MCG/ACT inhaler Commonly known as:  PROVENTIL HFA;VENTOLIN HFA Inhale 2 puffs into the lungs every 4 (four) hours as needed for wheezing or shortness of breath.   amLODipine 10 MG tablet Commonly known as:  NORVASC Take 1 tablet (10 mg total) by mouth daily.   amoxicillin-clavulanate 875-125 MG tablet Commonly known as:  AUGMENTIN Take 1 tablet by mouth 2 (two) times daily.   atorvastatin 20 MG tablet Commonly known as:  LIPITOR Take 20 mg by mouth daily.   benzonatate 200 MG capsule Commonly known as:  TESSALON Take 200 mg by mouth 3 (three) times daily as needed for cough.   bisacodyl 5 MG EC tablet Commonly known as:  DULCOLAX Take 1 tablet (5 mg  total) by mouth daily as needed for moderate constipation.   cholecalciferol 1000 units tablet Commonly known as:  VITAMIN D Take 1,000 Units by mouth daily.   donepezil 10 MG tablet Commonly known as:  ARICEPT Take 10 mg by mouth at bedtime.   DULoxetine 60 MG capsule Commonly known as:  CYMBALTA Take 60 mg by mouth daily.   feeding supplement (ENSURE ENLIVE) Liqd Take 237 mLs by mouth 2 (two) times daily between meals.   finasteride 5 MG tablet Commonly known as:  PROSCAR Take 5 mg by mouth daily.   furosemide 20 MG tablet Commonly known as:  LASIX Take 40 mg by mouth daily. Take 2 tablets in the morning. Take an additional tablet 6 hours  later.   HYDROcodone-acetaminophen 5-325 MG tablet Commonly known as:  NORCO/VICODIN Take 1 tablet by mouth every 6 (six) hours as needed for moderate pain.   insulin aspart 100 UNIT/ML injection Commonly known as:  novoLOG Inject 4 Units into the skin 3 (three) times daily with meals.   insulin detemir 100 UNIT/ML injection Commonly known as:  LEVEMIR Inject 0.1 mLs (10 Units total) into the skin 2 (two) times daily.   metoprolol tartrate 25 MG tablet Commonly known as:  LOPRESSOR Take 1 tablet (25 mg total) by mouth 2 (two) times daily.   NAMENDA XR 28 MG Cp24 24 hr capsule Generic drug:  memantine Take 28 mg by mouth daily.   pantoprazole 40 MG tablet Commonly known as:  PROTONIX Take 1 tablet (40 mg total) by mouth daily.   pregabalin 75 MG capsule Commonly known as:  LYRICA Take 1 capsule (75 mg total) by mouth 2 (two) times daily.   PROSTATE SR 160-250 MG Caps Take 1 capsule by mouth daily.   senna 8.6 MG Tabs tablet Commonly known as:  SENOKOT Take 1 tablet (8.6 mg total) by mouth daily.   STOOL SOFTENER 100 MG capsule Generic drug:  docusate sodium Take 100 mg by mouth daily as needed for mild constipation or moderate constipation.   timolol 0.5 % ophthalmic solution Commonly known as:  TIMOPTIC Place 1 drop into both eyes 2 (two) times daily.   XARELTO 20 MG Tabs tablet Generic drug:  rivaroxaban Take 1 tablet by mouth daily.       Review of Systems  Unable to perform ROS: Dementia  Constitutional: Negative for activity change, appetite change, chills, diaphoresis and fever.  HENT: Negative for congestion, sneezing, sore throat, trouble swallowing and voice change.   Eyes: Negative.   Respiratory: Negative for apnea, cough, choking, chest tightness, shortness of breath and wheezing.   Cardiovascular: Negative for chest pain, palpitations and leg swelling.  Gastrointestinal: Negative for abdominal distention, abdominal pain, constipation, diarrhea  and nausea.  Genitourinary: Negative for difficulty urinating, dysuria, frequency and urgency.  Musculoskeletal: Negative for back pain, gait problem and myalgias. Arthralgias: typical arthritis.  Skin: Negative for color change, pallor, rash and wound.  Neurological: Positive for weakness. Negative for dizziness, tremors, syncope, speech difficulty, numbness and headaches.  Psychiatric/Behavioral: Negative for agitation and behavioral problems.  All other systems reviewed and are negative.   Immunization History  Administered Date(s) Administered  . Influenza,inj,Quad PF,36+ Mos 11/11/2015   Pertinent  Health Maintenance Due  Topic Date Due  . FOOT EXAM  07/16/1941  . OPHTHALMOLOGY EXAM  07/16/1941  . URINE MICROALBUMIN  07[birth year]16/1941  . PNA vac Low Risk Adult (1 of 2 - PCV13) 07/16/1998  . HEMOGLOBIN A1C  03/08/2015  . INFLUENZA  VACCINE  09/10/2016   No flowsheet data found. Functional Status Survey:    Vitals:   05/03/16 1130  BP: 128/61  Pulse: 62  Temp: 97.1 F (36.2 C)  Weight: 168 lb 6.4 oz (76.4 kg)   Body mass index is 23.49 kg/m. Physical Exam  Constitutional: Vital signs are normal. He appears well-developed and well-nourished. He is active and cooperative. He does not appear ill. No distress.  HENT:  Head: Normocephalic and atraumatic.  Mouth/Throat: Uvula is midline, oropharynx is clear and moist and mucous membranes are normal. Mucous membranes are not pale, not dry and not cyanotic.  Eyes: Conjunctivae, EOM and lids are normal. Pupils are equal, round, and reactive to light.  Neck: Trachea normal, normal range of motion and full passive range of motion without pain. Neck supple. No JVD present. No tracheal deviation, no edema and no erythema present. No thyromegaly present.  Cardiovascular: Normal rate, regular rhythm, normal heart sounds, intact distal pulses and normal pulses.  Exam reveals no gallop, no distant heart sounds and no friction rub.   No murmur  heard. Pulmonary/Chest: Effort normal. No accessory muscle usage. No respiratory distress. He has decreased breath sounds in the right lower field and the left lower field. He has no wheezes. He has no rhonchi. He has no rales. He exhibits no tenderness.  Abdominal: Normal appearance and bowel sounds are normal. He exhibits no distension and no ascites. There is no tenderness.  Musculoskeletal: Normal range of motion. He exhibits no edema or tenderness.  Expected osteoarthritis, stiffness; generalized weakness and deconditioning  Neurological: He is alert. He is disoriented (dementia, oriented to person and place).  Skin: Skin is warm, dry and intact. No rash noted. He is not diaphoretic. No cyanosis or erythema. No pallor. Nails show no clubbing.  Psychiatric: He has a normal mood and affect. His speech is normal and behavior is normal. Judgment and thought content normal. Cognition and memory are normal.  Nursing note and vitals reviewed.   Labs reviewed:  Recent Labs  10/17/15 1434  11/13/15 0426  04/05/16 1848 04/06/16 0431 04/07/16 0327 04/08/16 0310 04/24/16 1340  NA 135  < > 139  < > 133* 139  --   --  133*  K 4.4  < > 3.2*  < > 4.6 3.9  --   --  4.9  CL 99*  < > 106  < > 99* 107  --   --  98*  CO2 26  < > 26  < > 26 27  --   --  26  GLUCOSE 124*  < > 100*  < > 202* 144*  --   --  408*  BUN 30*  < > 23*  < > 26* 23*  --   --  41*  CREATININE 2.17*  < > 1.61*  < > 1.82* 1.38* 1.61* 1.43* 1.83*  CALCIUM 8.9  < > 9.0  < > 9.0 8.3*  --   --  9.1  MG 2.0  --  1.9  --   --   --   --   --   --   < > = values in this interval not displayed.  Recent Labs  11/10/15 0326 04/05/16 1848 04/24/16 1340  AST ALT 12* 18 33  ALKPHOS 67 66 92  BILITOT 0.7 0.5 0.2*  PROT 7.1 7.9 7.8  ALBUMIN 2.7* 3.4* 3.0*    Recent Labs  10/17/15 1434  11/09/15 1549  04/06/16 0431 04/08/16 0310 04/24/16 1340  WBC 10.9*  < > 13.7*  < > 10.8* 5.2 6.8  NEUTROABS 9.4*  --  11.9*  --    --   --  4.9  HGB 11.5*  < > 11.7*  < > 9.8* 10.5* 10.3*  HCT 33.5*  < > 34.3*  < > 28.7* 30.1* 30.2*  MCV 92.0  < > 90.1  < > 92.8 91.7 92.4  PLT 276  < > 275  < > 221 243 322  < > = values in this interval not displayed. Lab Results  Component Value Date   TSH 2.074 12/28/2014   Lab Results  Component Value Date   HGBA1C 9.5 (H) 09/05/2014   No results found for: CHOL, HDL, LDLCALC, LDLDIRECT, TRIG, CHOLHDL  Significant Diagnostic Results in last 30 days:  No results found.  Assessment/Plan 1. Type 2 diabetes mellitus with stage 3 chronic kidney disease, with long-term current use of insulin (HCC)  Decrease Levemir to 22 units BID  Continue to monitor FSBS QID  2. Pneumonia due to infectious organism, unspecified laterality, unspecified part of lung  Resolved    Family/ staff Communication:   Total Time:  Documentation:  Face to Face:  Family/Phone:   Labs/tests ordered:    Medication list reviewed and assessed for continued appropriateness. Monthly medication orders reviewed and signed.  Brynda Rim, NP-C Geriatrics [facility] [phone], [place] [address] Cell Phone (Mon-Fri 8am-5pm):  [phone] On Call:  [phone] & follow prompts after 5pm & weekends Office Phone:  [phone] Office Fax:  [phone]

## 2016-05-19 LAB — GLUCOSE, CAPILLARY
GLUCOSE-CAPILLARY: 195 mg/dL — AB (ref 65–99)
Glucose-Capillary: 101 mg/dL — ABNORMAL HIGH (ref 65–99)
Glucose-Capillary: 218 mg/dL — ABNORMAL HIGH (ref 65–99)
Glucose-Capillary: 325 mg/dL — ABNORMAL HIGH (ref 65–99)

## 2016-05-20 LAB — GLUCOSE, CAPILLARY
GLUCOSE-CAPILLARY: 210 mg/dL — AB (ref 65–99)
GLUCOSE-CAPILLARY: 207 mg/dL — AB (ref 65–99)
GLUCOSE-CAPILLARY: 307 mg/dL — AB (ref 65–99)

## 2016-05-21 LAB — GLUCOSE, CAPILLARY
GLUCOSE-CAPILLARY: 145 mg/dL — AB (ref 65–99)
GLUCOSE-CAPILLARY: 170 mg/dL — AB (ref 65–99)
Glucose-Capillary: 202 mg/dL — ABNORMAL HIGH (ref 65–99)

## 2016-05-22 LAB — GLUCOSE, CAPILLARY
GLUCOSE-CAPILLARY: 90 mg/dL (ref 65–99)
GLUCOSE-CAPILLARY: 55 mg/dL — AB (ref 65–99)
GLUCOSE-CAPILLARY: 89 mg/dL (ref 65–99)
GLUCOSE-CAPILLARY: 175 mg/dL — AB (ref 65–99)
Glucose-Capillary: 180 mg/dL — ABNORMAL HIGH (ref 65–99)
Glucose-Capillary: 209 mg/dL — ABNORMAL HIGH (ref 65–99)

## 2016-05-23 LAB — GLUCOSE, CAPILLARY
Glucose-Capillary: 115 mg/dL — ABNORMAL HIGH (ref 65–99)
Glucose-Capillary: 96 mg/dL (ref 65–99)
Glucose-Capillary: 200 mg/dL — ABNORMAL HIGH (ref 65–99)
Glucose-Capillary: 233 mg/dL — ABNORMAL HIGH (ref 65–99)

## 2016-05-24 LAB — GLUCOSE, CAPILLARY
GLUCOSE-CAPILLARY: 133 mg/dL — AB (ref 65–99)
GLUCOSE-CAPILLARY: 145 mg/dL — AB (ref 65–99)
Glucose-Capillary: 86 mg/dL (ref 65–99)
Glucose-Capillary: 185 mg/dL — ABNORMAL HIGH (ref 65–99)

## 2016-05-25 LAB — GLUCOSE, CAPILLARY
GLUCOSE-CAPILLARY: 236 mg/dL — AB (ref 65–99)
Glucose-Capillary: 71 mg/dL (ref 65–99)
Glucose-Capillary: 240 mg/dL — ABNORMAL HIGH (ref 65–99)

## 2016-05-26 LAB — GLUCOSE, CAPILLARY
GLUCOSE-CAPILLARY: 149 mg/dL — AB (ref 65–99)
GLUCOSE-CAPILLARY: 208 mg/dL — AB (ref 65–99)
Glucose-Capillary: 159 mg/dL — ABNORMAL HIGH (ref 65–99)

## 2016-05-27 LAB — GLUCOSE, CAPILLARY
GLUCOSE-CAPILLARY: 254 mg/dL — AB (ref 65–99)
GLUCOSE-CAPILLARY: 170 mg/dL — AB (ref 65–99)
GLUCOSE-CAPILLARY: 152 mg/dL — AB (ref 65–99)
Glucose-Capillary: 151 mg/dL — ABNORMAL HIGH (ref 65–99)
Glucose-Capillary: 184 mg/dL — ABNORMAL HIGH (ref 65–99)

## 2016-05-28 LAB — GLUCOSE, CAPILLARY
Glucose-Capillary: 127 mg/dL — ABNORMAL HIGH (ref 65–99)
Glucose-Capillary: 176 mg/dL — ABNORMAL HIGH (ref 65–99)
Glucose-Capillary: 257 mg/dL — ABNORMAL HIGH (ref 65–99)
Glucose-Capillary: 322 mg/dL — ABNORMAL HIGH (ref 65–99)

## 2016-05-29 LAB — GLUCOSE, CAPILLARY
GLUCOSE-CAPILLARY: 185 mg/dL — AB (ref 65–99)
GLUCOSE-CAPILLARY: 258 mg/dL — AB (ref 65–99)
Glucose-Capillary: 248 mg/dL — ABNORMAL HIGH (ref 65–99)
Glucose-Capillary: 395 mg/dL — ABNORMAL HIGH (ref 65–99)

## 2016-05-30 LAB — GLUCOSE, CAPILLARY
Glucose-Capillary: 241 mg/dL — ABNORMAL HIGH (ref 65–99)
Glucose-Capillary: 237 mg/dL — ABNORMAL HIGH (ref 65–99)

## 2016-05-31 LAB — GLUCOSE, CAPILLARY
GLUCOSE-CAPILLARY: 331 mg/dL — AB (ref 65–99)
GLUCOSE-CAPILLARY: 132 mg/dL — AB (ref 65–99)
GLUCOSE-CAPILLARY: 205 mg/dL — AB (ref 65–99)
Glucose-Capillary: 221 mg/dL — ABNORMAL HIGH (ref 65–99)

## 2016-06-01 LAB — GLUCOSE, CAPILLARY
GLUCOSE-CAPILLARY: 105 mg/dL — AB (ref 65–99)
GLUCOSE-CAPILLARY: 170 mg/dL — AB (ref 65–99)
Glucose-Capillary: 251 mg/dL — ABNORMAL HIGH (ref 65–99)
Glucose-Capillary: 269 mg/dL — ABNORMAL HIGH (ref 65–99)

## 2016-06-02 LAB — GLUCOSE, CAPILLARY
GLUCOSE-CAPILLARY: 276 mg/dL — AB (ref 65–99)
Glucose-Capillary: 111 mg/dL — ABNORMAL HIGH (ref 65–99)
Glucose-Capillary: 294 mg/dL — ABNORMAL HIGH (ref 65–99)
Glucose-Capillary: 373 mg/dL — ABNORMAL HIGH (ref 65–99)

## 2016-06-03 LAB — GLUCOSE, CAPILLARY
GLUCOSE-CAPILLARY: 308 mg/dL — AB (ref 65–99)
GLUCOSE-CAPILLARY: 235 mg/dL — AB (ref 65–99)
GLUCOSE-CAPILLARY: 327 mg/dL — AB (ref 65–99)
GLUCOSE-CAPILLARY: 369 mg/dL — AB (ref 65–99)

## 2016-06-04 LAB — GLUCOSE, CAPILLARY
GLUCOSE-CAPILLARY: 143 mg/dL — AB (ref 65–99)
GLUCOSE-CAPILLARY: 256 mg/dL — AB (ref 65–99)
Glucose-Capillary: 128 mg/dL — ABNORMAL HIGH (ref 65–99)

## 2016-06-05 LAB — GLUCOSE, CAPILLARY
GLUCOSE-CAPILLARY: 137 mg/dL — AB (ref 65–99)
GLUCOSE-CAPILLARY: 182 mg/dL — AB (ref 65–99)
GLUCOSE-CAPILLARY: 277 mg/dL — AB (ref 65–99)
Glucose-Capillary: 232 mg/dL — ABNORMAL HIGH (ref 65–99)

## 2016-06-06 LAB — GLUCOSE, CAPILLARY
GLUCOSE-CAPILLARY: 245 mg/dL — AB (ref 65–99)
Glucose-Capillary: 130 mg/dL — ABNORMAL HIGH (ref 65–99)
Glucose-Capillary: 179 mg/dL — ABNORMAL HIGH (ref 65–99)

## 2016-06-07 LAB — GLUCOSE, CAPILLARY
GLUCOSE-CAPILLARY: 243 mg/dL — AB (ref 65–99)
GLUCOSE-CAPILLARY: 310 mg/dL — AB (ref 65–99)
Glucose-Capillary: 101 mg/dL — ABNORMAL HIGH (ref 65–99)
Glucose-Capillary: 367 mg/dL — ABNORMAL HIGH (ref 65–99)

## 2016-06-08 LAB — GLUCOSE, CAPILLARY
GLUCOSE-CAPILLARY: 255 mg/dL — AB (ref 65–99)
GLUCOSE-CAPILLARY: 313 mg/dL — AB (ref 65–99)
GLUCOSE-CAPILLARY: 378 mg/dL — AB (ref 65–99)

## 2016-06-09 LAB — GLUCOSE, CAPILLARY
GLUCOSE-CAPILLARY: 314 mg/dL — AB (ref 65–99)
Glucose-Capillary: 135 mg/dL — ABNORMAL HIGH (ref 65–99)
Glucose-Capillary: 239 mg/dL — ABNORMAL HIGH (ref 65–99)
Glucose-Capillary: 249 mg/dL — ABNORMAL HIGH (ref 65–99)

## 2016-06-10 ENCOUNTER — Encounter
Admission: RE | Admit: 2016-06-10 | Discharge: 2016-06-10 | Disposition: A | Discharge status: Home / Self Care | Payer: Medicare Other | Source: Ambulatory Visit | Attending: Internal Medicine | Admitting: Internal Medicine

## 2016-06-10 LAB — GLUCOSE, CAPILLARY
GLUCOSE-CAPILLARY: 127 mg/dL — AB (ref 65–99)
Glucose-Capillary: 152 mg/dL — ABNORMAL HIGH (ref 65–99)
Glucose-Capillary: 237 mg/dL — ABNORMAL HIGH (ref 65–99)
Glucose-Capillary: 342 mg/dL — ABNORMAL HIGH (ref 65–99)

## 2016-06-11 LAB — GLUCOSE, CAPILLARY
GLUCOSE-CAPILLARY: 235 mg/dL — AB (ref 65–99)
Glucose-Capillary: 153 mg/dL — ABNORMAL HIGH (ref 65–99)
Glucose-Capillary: 176 mg/dL — ABNORMAL HIGH (ref 65–99)

## 2016-06-12 LAB — GLUCOSE, CAPILLARY
GLUCOSE-CAPILLARY: 158 mg/dL — AB (ref 65–99)
GLUCOSE-CAPILLARY: 164 mg/dL — AB (ref 65–99)
GLUCOSE-CAPILLARY: 221 mg/dL — AB (ref 65–99)
Glucose-Capillary: 276 mg/dL — ABNORMAL HIGH (ref 65–99)

## 2016-06-13 LAB — GLUCOSE, CAPILLARY
GLUCOSE-CAPILLARY: 268 mg/dL — AB (ref 65–99)
GLUCOSE-CAPILLARY: 226 mg/dL — AB (ref 65–99)
GLUCOSE-CAPILLARY: 194 mg/dL — AB (ref 65–99)

## 2016-06-16 LAB — GLUCOSE, CAPILLARY
GLUCOSE-CAPILLARY: 130 mg/dL — AB (ref 65–99)
GLUCOSE-CAPILLARY: 161 mg/dL — AB (ref 65–99)
GLUCOSE-CAPILLARY: 183 mg/dL — AB (ref 65–99)
GLUCOSE-CAPILLARY: 216 mg/dL — AB (ref 65–99)
GLUCOSE-CAPILLARY: 157 mg/dL — AB (ref 65–99)
GLUCOSE-CAPILLARY: 191 mg/dL — AB (ref 65–99)
GLUCOSE-CAPILLARY: 265 mg/dL — AB (ref 65–99)
GLUCOSE-CAPILLARY: 137 mg/dL — AB (ref 65–99)
GLUCOSE-CAPILLARY: 141 mg/dL — AB (ref 65–99)
Glucose-Capillary: 227 mg/dL — ABNORMAL HIGH (ref 65–99)
Glucose-Capillary: 229 mg/dL — ABNORMAL HIGH (ref 65–99)
Glucose-Capillary: 205 mg/dL — ABNORMAL HIGH (ref 65–99)
Glucose-Capillary: 51 mg/dL — ABNORMAL LOW (ref 65–99)
Glucose-Capillary: 86 mg/dL (ref 65–99)

## 2016-06-17 LAB — GLUCOSE, CAPILLARY
GLUCOSE-CAPILLARY: 276 mg/dL — AB (ref 65–99)
Glucose-Capillary: 225 mg/dL — ABNORMAL HIGH (ref 65–99)
Glucose-Capillary: 102 mg/dL — ABNORMAL HIGH (ref 65–99)
Glucose-Capillary: 261 mg/dL — ABNORMAL HIGH (ref 65–99)
Glucose-Capillary: 275 mg/dL — ABNORMAL HIGH (ref 65–99)

## 2016-06-18 LAB — GLUCOSE, CAPILLARY
GLUCOSE-CAPILLARY: 309 mg/dL — AB (ref 65–99)
GLUCOSE-CAPILLARY: 205 mg/dL — AB (ref 65–99)
Glucose-Capillary: 221 mg/dL — ABNORMAL HIGH (ref 65–99)

## 2016-06-19 LAB — GLUCOSE, CAPILLARY
GLUCOSE-CAPILLARY: 252 mg/dL — AB (ref 65–99)
Glucose-Capillary: 101 mg/dL — ABNORMAL HIGH (ref 65–99)

## 2016-06-20 ENCOUNTER — Non-Acute Institutional Stay (SKILLED_NURSING_FACILITY): Payer: Medicare Other | Admitting: Gerontology

## 2016-06-20 DIAGNOSIS — F329 Major depressive disorder, single episode, unspecified: Secondary | ICD-10-CM

## 2016-06-20 DIAGNOSIS — I110 Hypertensive heart disease with heart failure: Secondary | ICD-10-CM

## 2016-06-20 DIAGNOSIS — I5022 Chronic systolic (congestive) heart failure: Secondary | ICD-10-CM

## 2016-06-20 DIAGNOSIS — F0280 Dementia in other diseases classified elsewhere without behavioral disturbance: Secondary | ICD-10-CM | POA: Diagnosis not present

## 2016-06-20 DIAGNOSIS — K219 Gastro-esophageal reflux disease without esophagitis: Secondary | ICD-10-CM

## 2016-06-20 DIAGNOSIS — E1142 Type 2 diabetes mellitus with diabetic polyneuropathy: Secondary | ICD-10-CM

## 2016-06-20 DIAGNOSIS — G301 Alzheimer's disease with late onset: Secondary | ICD-10-CM

## 2016-06-20 DIAGNOSIS — E11319 Type 2 diabetes mellitus with unspecified diabetic retinopathy without macular edema: Secondary | ICD-10-CM | POA: Diagnosis not present

## 2016-06-20 DIAGNOSIS — E1139 Type 2 diabetes mellitus with other diabetic ophthalmic complication: Secondary | ICD-10-CM | POA: Diagnosis not present

## 2016-06-20 DIAGNOSIS — I82502 Chronic embolism and thrombosis of unspecified deep veins of left lower extremity: Secondary | ICD-10-CM | POA: Diagnosis not present

## 2016-06-20 DIAGNOSIS — M51369 Other intervertebral disc degeneration, lumbar region without mention of lumbar back pain or lower extremity pain: Secondary | ICD-10-CM

## 2016-06-20 DIAGNOSIS — R1311 Dysphagia, oral phase: Secondary | ICD-10-CM

## 2016-06-20 DIAGNOSIS — H409 Unspecified glaucoma: Secondary | ICD-10-CM

## 2016-06-20 DIAGNOSIS — E7800 Pure hypercholesterolemia, unspecified: Secondary | ICD-10-CM

## 2016-06-20 DIAGNOSIS — L308 Other specified dermatitis: Secondary | ICD-10-CM

## 2016-06-20 DIAGNOSIS — M5136 Other intervertebral disc degeneration, lumbar region: Secondary | ICD-10-CM | POA: Diagnosis not present

## 2016-06-20 DIAGNOSIS — I10 Essential (primary) hypertension: Secondary | ICD-10-CM

## 2016-06-20 DIAGNOSIS — E1122 Type 2 diabetes mellitus with diabetic chronic kidney disease: Secondary | ICD-10-CM | POA: Diagnosis not present

## 2016-06-20 DIAGNOSIS — Z794 Long term (current) use of insulin: Secondary | ICD-10-CM

## 2016-06-20 DIAGNOSIS — N400 Enlarged prostate without lower urinary tract symptoms: Secondary | ICD-10-CM

## 2016-06-20 LAB — GLUCOSE, CAPILLARY
GLUCOSE-CAPILLARY: 310 mg/dL — AB (ref 65–99)
GLUCOSE-CAPILLARY: 253 mg/dL — AB (ref 65–99)
GLUCOSE-CAPILLARY: 168 mg/dL — AB (ref 65–99)
GLUCOSE-CAPILLARY: 195 mg/dL — AB (ref 65–99)
Glucose-Capillary: 314 mg/dL — ABNORMAL HIGH (ref 65–99)
Glucose-Capillary: 319 mg/dL — ABNORMAL HIGH (ref 65–99)

## 2016-06-20 NOTE — Progress Notes (Signed)
Location:      Place of Service:  SNF (31) Provider:  Lorenso Quarry, NP-C  Marisue Ivan, MD  Patient Care Team: Marisue Ivan, MD as PCP - General (Family Medicine)  Extended Emergency Contact Information Primary Emergency Contact: [name] Address: [phone]          [place], [place] [address] [place] Home Phone: [phone] Relation: Spouse  Code Status:  full Goals of care: Advanced Directive information Advanced Directives 04/06/2016  Does Patient Have a Medical Advance Directive? Yes  Type of Advance Directive Living will  Does patient want to make changes to medical advance directive? No - Patient declined  Copy of Healthcare Power of Attorney in Chart? -  Would patient like information on creating a medical advance directive? No - Patient declined     Chief Complaint  Patient presents with  . Medical Management of Chronic Issues    HPI:  Pt is a 83 y.o. male seen today for medical management of chronic diseases. Pt has been in the facility for long term care since February. Since them, his FSBS have been uncontrolled with current home regimen. Will change medications to obtain better control of the glucose and monitor for needed adjustments. Pt has CHF with chronic BLE pitting edema. L>R. + pulses in BLE. H/O DVTs. Negative Homan's sign in B-calves. Pt does have dementia. Pt has remained safe without falls. No c/o pain. Depression well managed. No c/o n/v/d/f/c/cp/sob/ha/abd pain/dizziness/cough. Pt was smiling, pleasantly confused. Pt reports appetite is good and denies difficulty swallowing. Having regular BMs. VSS. No other complaints.     Past Medical History:  Diagnosis Date  . Arthritis   . Background retinopathy   . Cervicalgia   . Chronic kidney disease   . Chronic systolic CHF (congestive heart failure) (HCC)   . Degeneration of lumbar or lumbosacral intervertebral disc   . Dementia   . Diabetes mellitus without  complication (HCC)   . DVT (deep venous thrombosis) (HCC)   . Generalized weakness   . Glaucoma   . Hypercholesteremia   . Hypertension   . Long-term insulin use (HCC)   . Peripheral polyneuropathy (HCC)   . Peripheral vascular disease (HCC)   . Spinal stenosis    Past Surgical History:  Procedure Laterality Date  . c-spine fusion N/A     Allergies  Allergen Reactions  . Fosinopril Other (See Comments)    Reaction: Hyperkalemia     Allergies as of 06/20/2016      Reactions   Fosinopril Other (See Comments)   Reaction: Hyperkalemia       Medication List       Accurate as of 06/20/16  3:17 PM. Always use your most recent med list.          acetaminophen 325 MG tablet Commonly known as:  TYLENOL Take 650 mg by mouth every 6 (six) hours as needed for mild pain, moderate pain, fever or headache.   albuterol 108 (90 Base) MCG/ACT inhaler Commonly known as:  PROVENTIL HFA;VENTOLIN HFA Inhale 2 puffs into the lungs every 4 (four) hours as needed for wheezing or shortness of breath.   amLODipine 10 MG tablet Commonly known as:  NORVASC Take 1 tablet (10 mg total) by mouth daily.   amoxicillin-clavulanate 875-125 MG tablet Commonly known as:  AUGMENTIN Take 1 tablet by mouth 2 (two) times daily.   atorvastatin 20 MG tablet Commonly known as:  LIPITOR Take 20 mg by mouth daily.  benzonatate 200 MG capsule Commonly known as:  TESSALON Take 200 mg by mouth 3 (three) times daily as needed for cough.   bisacodyl 5 MG EC tablet Commonly known as:  DULCOLAX Take 1 tablet (5 mg total) by mouth daily as needed for moderate constipation.   cholecalciferol 1000 units tablet Commonly known as:  VITAMIN D Take 1,000 Units by mouth daily.   donepezil 10 MG tablet Commonly known as:  ARICEPT Take 10 mg by mouth at bedtime.   DULoxetine 60 MG capsule Commonly known as:  CYMBALTA Take 60 mg by mouth daily.   feeding supplement (ENSURE ENLIVE) Liqd Take 237 mLs by  mouth 2 (two) times daily between meals.   finasteride 5 MG tablet Commonly known as:  PROSCAR Take 5 mg by mouth daily.   furosemide 20 MG tablet Commonly known as:  LASIX Take 40 mg by mouth daily. Take 2 tablets in the morning. Take an additional tablet 6 hours later.   HYDROcodone-acetaminophen 5-325 MG tablet Commonly known as:  NORCO/VICODIN Take 1 tablet by mouth every 6 (six) hours as needed for moderate pain.   insulin aspart 100 UNIT/ML injection Commonly known as:  novoLOG Inject 4 Units into the skin 3 (three) times daily with meals.   insulin detemir 100 UNIT/ML injection Commonly known as:  LEVEMIR Inject 0.1 mLs (10 Units total) into the skin 2 (two) times daily.   metoprolol tartrate 25 MG tablet Commonly known as:  LOPRESSOR Take 1 tablet (25 mg total) by mouth 2 (two) times daily.   NAMENDA XR 28 MG Cp24 24 hr capsule Generic drug:  memantine Take 28 mg by mouth daily.   pantoprazole 40 MG tablet Commonly known as:  PROTONIX Take 1 tablet (40 mg total) by mouth daily.   pregabalin 75 MG capsule Commonly known as:  LYRICA Take 1 capsule (75 mg total) by mouth 2 (two) times daily.   PROSTATE SR 160-250 MG Caps Take 1 capsule by mouth daily.   senna 8.6 MG Tabs tablet Commonly known as:  SENOKOT Take 1 tablet (8.6 mg total) by mouth daily.   STOOL SOFTENER 100 MG capsule Generic drug:  docusate sodium Take 100 mg by mouth daily as needed for mild constipation or moderate constipation.   timolol 0.5 % ophthalmic solution Commonly known as:  TIMOPTIC Place 1 drop into both eyes 2 (two) times daily.   XARELTO 20 MG Tabs tablet Generic drug:  rivaroxaban Take 1 tablet by mouth daily.       Review of Systems  Unable to perform ROS: Dementia  Constitutional: Negative for activity change, appetite change, chills, diaphoresis and fever.  HENT: Negative for congestion, sneezing, sore throat, trouble swallowing and voice change.   Eyes: Negative.     Respiratory: Negative for apnea, cough, choking, chest tightness, shortness of breath and wheezing.   Cardiovascular: Negative for chest pain, palpitations and leg swelling.  Gastrointestinal: Negative for abdominal distention, abdominal pain, constipation, diarrhea and nausea.  Genitourinary: Negative for difficulty urinating, dysuria, frequency and urgency.  Musculoskeletal: Negative for back pain, gait problem and myalgias. Arthralgias: typical arthritis.  Skin: Negative for color change, pallor, rash and wound.  Neurological: Positive for weakness. Negative for dizziness, tremors, syncope, speech difficulty, numbness and headaches.  Psychiatric/Behavioral: Negative for agitation and behavioral problems.  All other systems reviewed and are negative.   Immunization History  Administered Date(s) Administered  . Influenza,inj,Quad PF,36+ Mos 11/11/2015   Pertinent  Health Maintenance Due  Topic Date Due  .  FOOT EXAM  07/16/1941  . OPHTHALMOLOGY EXAM  07[birth year]16/1941  . URINE MICROALBUMIN  07[birth year]16/1941  . PNA vac Low Risk Adult (1 of 2 - PCV13) 07/16/1998  . HEMOGLOBIN A1C  03/08/2015  . INFLUENZA VACCINE  09/10/2016   No flowsheet data found. Functional Status Survey:    Vitals:   05/30/16 0800  BP: (!) 176/73  Pulse: 64  Resp: 18  Temp: 97.9 F (36.6 C)  SpO2: 98%  Weight: 177 lb 12.8 oz (80.6 kg)   Body mass index is 24.8 kg/m. Physical Exam  Constitutional: Vital signs are normal. He appears well-developed and well-nourished. He is active and cooperative. He does not appear ill. No distress.  HENT:  Head: Normocephalic and atraumatic.  Mouth/Throat: Uvula is midline, oropharynx is clear and moist and mucous membranes are normal. Mucous membranes are not pale, not dry and not cyanotic.  Eyes: Conjunctivae, EOM and lids are normal. Pupils are equal, round, and reactive to light.  Neck: Trachea normal, normal range of motion and full passive range of motion without pain. Neck  supple. No JVD present. No tracheal deviation, no edema and no erythema present. No thyromegaly present.  Cardiovascular: Normal rate, regular rhythm, normal heart sounds, intact distal pulses and normal pulses.  Exam reveals no gallop, no distant heart sounds and no friction rub.   No murmur heard. Pulmonary/Chest: Effort normal and breath sounds normal. No accessory muscle usage. No respiratory distress. He has no wheezes. He has no rhonchi. He has no rales. He exhibits no tenderness.  Abdominal: Normal appearance and bowel sounds are normal. He exhibits no distension and no ascites. There is no tenderness.  Musculoskeletal: Normal range of motion. He exhibits no edema or tenderness.  Expected osteoarthritis, stiffness  Neurological: He is alert. He has normal strength. He is disoriented (to place).  Skin: Skin is warm, dry and intact. No rash noted. He is not diaphoretic. No cyanosis or erythema. No pallor. Nails show no clubbing.  Psychiatric: He has a normal mood and affect. His speech is normal and behavior is normal. Judgment and thought content normal. Cognition and memory are normal.  Nursing note and vitals reviewed.   Labs reviewed:  Recent Labs  10/17/15 1434  11/13/15 0426  04/05/16 1848 04/06/16 0431 04/07/16 0327 04/08/16 0310 04/24/16 1340  NA 135  < > 139  < > 133* 139  --   --  133*  K 4.4  < > 3.2*  < > 4.6 3.9  --   --  4.9  CL 99*  < > 106  < > 99* 107  --   --  98*  CO2 26  < > 26  < > 26 27  --   --  26  GLUCOSE 124*  < > 100*  < > 202* 144*  --   --  408*  BUN 30*  < > 23*  < > 26* 23*  --   --  41*  CREATININE 2.17*  < > 1.61*  < > 1.82* 1.38* 1.61* 1.43* 1.83*  CALCIUM 8.9  < > 9.0  < > 9.0 8.3*  --   --  9.1  MG 2.0  --  1.9  --   --   --   --   --   --   < > = values in this interval not displayed.  Recent Labs  11/10/15 0326 04/05/16 1848 04/24/16 1340  AST 18 27 24   ALT 12* 18 33  ALKPHOS  67 66 92  BILITOT 0.7 0.5 0.2*  PROT 7.1 7.9 7.8    ALBUMIN 2.7* 3.4* 3.0*    Recent Labs  10/17/15 1434  11/09/15 1549  04/06/16 0431 04/08/16 0310 04/24/16 1340  WBC 10.9*  < > 13.7*  < > 10.8* 5.2 6.8  NEUTROABS 9.4*  --  11.9*  --   --   --  4.9  HGB 11.5*  < > 11.7*  < > 9.8* 10.5* 10.3*  HCT 33.5*  < > 34.3*  < > 28.7* 30.1* 30.2*  MCV 92.0  < > 90.1  < > 92.8 91.7 92.4  PLT 276  < > 275  < > 221 243 322  < > = values in this interval not displayed. Lab Results  Component Value Date   TSH 2.074 12/28/2014   Lab Results  Component Value Date   HGBA1C 9.5 (H) 09/05/2014   No results found for: CHOL, HDL, LDLCALC, LDLDIRECT, TRIG, CHOLHDL  Significant Diagnostic Results in last 30 days:  No results found.  Assessment/Plan 1. Late onset Alzheimer's disease without behavioral disturbance 2. Dementia in other diseases classified elsewhere without behavioral disturbance  Stable  Continue Donepezil 10 mg po Q HS  Continue Namenda 28 mg XR po Q Day  3. Other intervertebral disc degeneration, lumbar region  Stable  Continue Tylenol 650 mg po Q 4 hours prn pain  4. Glaucoma, unspecified glaucoma type, unspecified laterality  Stable   Continue Timolol 0.5%- 1 drop in both eyes BID  5. 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 Type 2 diabetes mellitus with other diabetic ophthalmic complication (HCC) 7. Type 2 diabetes mellitus with chronic kidney disease, with long-term current use of insulin, unspecified CKD stage (HCC) 8. Type 2 diabetes mellitus with diabetic polyneuropathy, with long-term current use of insulin (HCC)  Stable  Discontinue Levemir 25 units SQ BID  Discontinue Novolog 13 units TID with meals  Toujeo 35 units SQ in the AM  Continue Lyrica 75 mg po BID  9. 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 Hypertensive heart disease with heart failure (HCC)  stable  11. Major depressive disorder with single episode, remission status unspecified  Stable  Continue Cholecalciferol 1,000 units po Q Day  Continue Duloxetine 60 mg po Q Day  12. Chronic systolic (congestive) heart failure (HCC)  Stable  Continue Furosemide 40 mg po Q AM  Continue Furosemide 20 mg po Q afternoon  13. Benign prostatic hyperplasia without lower urinary tract symptoms  Stable  Continue Finasteride 5 mg po Q Day  Continue Prostate SR 160-250 po Q Day  14. Pure hypercholesterolemia, unspecified  Stable  Continue Atorvastatin 20 mg po Q Day  15. Gastroesophageal reflux disease without esophagitis  Stable  Continue Protonix 40 mg po Q Day  16. Dysphagia, oral phase  Stable  Continue Glucerna 1 can BID  17. Essential hypertension  Stable  Continue Amlodipine 10 mg po Q Day  Continue Metoprolol 25 mg po BID  18. Dermatitis associated with moisture  D/C Triad Cream- wound has remained unchanged  Boudreaux's Butt Paste- liberal amount to area of skin irritation BID and prn   Family/ staff Communication:   Total Time:  Documentation:  Face to Face:  Family/Phone:   Labs/tests ordered:    Medication  list reviewed and assessed for continued appropriateness. Monthly medication orders reviewed and signed.  Vikki Ports, NP-C Geriatrics [facility] [phone], [place] [address] Cell Phone (Mon-Fri 8am-5pm):  [phone] On Call:  [phone] & follow prompts after 5pm & weekends Office Phone:  [phone] Office Fax:  [phone]

## 2016-06-23 LAB — GLUCOSE, CAPILLARY
GLUCOSE-CAPILLARY: 392 mg/dL — AB (ref 65–99)
GLUCOSE-CAPILLARY: 189 mg/dL — AB (ref 65–99)
GLUCOSE-CAPILLARY: 153 mg/dL — AB (ref 65–99)
Glucose-Capillary: 240 mg/dL — ABNORMAL HIGH (ref 65–99)
Glucose-Capillary: 336 mg/dL — ABNORMAL HIGH (ref 65–99)
Glucose-Capillary: 175 mg/dL — ABNORMAL HIGH (ref 65–99)
Glucose-Capillary: 197 mg/dL — ABNORMAL HIGH (ref 65–99)
Glucose-Capillary: 309 mg/dL — ABNORMAL HIGH (ref 65–99)
Glucose-Capillary: 246 mg/dL — ABNORMAL HIGH (ref 65–99)
Glucose-Capillary: 336 mg/dL — ABNORMAL HIGH (ref 65–99)
Glucose-Capillary: 299 mg/dL — ABNORMAL HIGH (ref 65–99)
Glucose-Capillary: 355 mg/dL — ABNORMAL HIGH (ref 65–99)

## 2016-06-24 LAB — GLUCOSE, CAPILLARY
GLUCOSE-CAPILLARY: 313 mg/dL — AB (ref 65–99)
GLUCOSE-CAPILLARY: 263 mg/dL — AB (ref 65–99)
Glucose-Capillary: 268 mg/dL — ABNORMAL HIGH (ref 65–99)
Glucose-Capillary: 398 mg/dL — ABNORMAL HIGH (ref 65–99)
Glucose-Capillary: 300 mg/dL — ABNORMAL HIGH (ref 65–99)
Glucose-Capillary: 378 mg/dL — ABNORMAL HIGH (ref 65–99)

## 2016-06-25 LAB — GLUCOSE, CAPILLARY
GLUCOSE-CAPILLARY: 405 mg/dL — AB (ref 65–99)
GLUCOSE-CAPILLARY: 252 mg/dL — AB (ref 65–99)
GLUCOSE-CAPILLARY: 316 mg/dL — AB (ref 65–99)

## 2016-06-26 LAB — GLUCOSE, CAPILLARY
GLUCOSE-CAPILLARY: 370 mg/dL — AB (ref 65–99)
GLUCOSE-CAPILLARY: 362 mg/dL — AB (ref 65–99)
Glucose-Capillary: 481 mg/dL — ABNORMAL HIGH (ref 65–99)
Glucose-Capillary: 233 mg/dL — ABNORMAL HIGH (ref 65–99)
Glucose-Capillary: 351 mg/dL — ABNORMAL HIGH (ref 65–99)

## 2016-06-27 LAB — GLUCOSE, CAPILLARY
Glucose-Capillary: 374 mg/dL — ABNORMAL HIGH (ref 65–99)
Glucose-Capillary: 448 mg/dL — ABNORMAL HIGH (ref 65–99)
Glucose-Capillary: 266 mg/dL — ABNORMAL HIGH (ref 65–99)
Glucose-Capillary: 356 mg/dL — ABNORMAL HIGH (ref 65–99)

## 2016-06-30 LAB — GLUCOSE, CAPILLARY
GLUCOSE-CAPILLARY: 132 mg/dL — AB (ref 65–99)
GLUCOSE-CAPILLARY: 192 mg/dL — AB (ref 65–99)
GLUCOSE-CAPILLARY: 276 mg/dL — AB (ref 65–99)
GLUCOSE-CAPILLARY: 315 mg/dL — AB (ref 65–99)
GLUCOSE-CAPILLARY: 345 mg/dL — AB (ref 65–99)
GLUCOSE-CAPILLARY: 449 mg/dL — AB (ref 65–99)
GLUCOSE-CAPILLARY: 471 mg/dL — AB (ref 65–99)
Glucose-Capillary: 389 mg/dL — ABNORMAL HIGH (ref 65–99)
Glucose-Capillary: 360 mg/dL — ABNORMAL HIGH (ref 65–99)
Glucose-Capillary: 224 mg/dL — ABNORMAL HIGH (ref 65–99)
Glucose-Capillary: 231 mg/dL — ABNORMAL HIGH (ref 65–99)
Glucose-Capillary: 265 mg/dL — ABNORMAL HIGH (ref 65–99)
Glucose-Capillary: 148 mg/dL — ABNORMAL HIGH (ref 65–99)

## 2016-07-01 LAB — GLUCOSE, CAPILLARY
GLUCOSE-CAPILLARY: 316 mg/dL — AB (ref 65–99)
GLUCOSE-CAPILLARY: 153 mg/dL — AB (ref 65–99)

## 2016-07-02 LAB — GLUCOSE, CAPILLARY
GLUCOSE-CAPILLARY: 435 mg/dL — AB (ref 65–99)
Glucose-Capillary: 255 mg/dL — ABNORMAL HIGH (ref 65–99)
Glucose-Capillary: 314 mg/dL — ABNORMAL HIGH (ref 65–99)
Glucose-Capillary: 317 mg/dL — ABNORMAL HIGH (ref 65–99)
Glucose-Capillary: 250 mg/dL — ABNORMAL HIGH (ref 65–99)
Glucose-Capillary: 299 mg/dL — ABNORMAL HIGH (ref 65–99)
Glucose-Capillary: 332 mg/dL — ABNORMAL HIGH (ref 65–99)

## 2016-07-03 LAB — GLUCOSE, CAPILLARY
GLUCOSE-CAPILLARY: 342 mg/dL — AB (ref 65–99)
GLUCOSE-CAPILLARY: 366 mg/dL — AB (ref 65–99)

## 2016-07-04 LAB — GLUCOSE, CAPILLARY
GLUCOSE-CAPILLARY: 347 mg/dL — AB (ref 65–99)
GLUCOSE-CAPILLARY: 426 mg/dL — AB (ref 65–99)
Glucose-Capillary: 447 mg/dL — ABNORMAL HIGH (ref 65–99)
Glucose-Capillary: 225 mg/dL — ABNORMAL HIGH (ref 65–99)
Glucose-Capillary: 284 mg/dL — ABNORMAL HIGH (ref 65–99)
Glucose-Capillary: 434 mg/dL — ABNORMAL HIGH (ref 65–99)

## 2016-07-05 LAB — GLUCOSE, CAPILLARY
GLUCOSE-CAPILLARY: 116 mg/dL — AB (ref 65–99)
GLUCOSE-CAPILLARY: 244 mg/dL — AB (ref 65–99)
GLUCOSE-CAPILLARY: 262 mg/dL — AB (ref 65–99)
Glucose-Capillary: 268 mg/dL — ABNORMAL HIGH (ref 65–99)

## 2016-07-06 LAB — GLUCOSE, CAPILLARY
GLUCOSE-CAPILLARY: 113 mg/dL — AB (ref 65–99)
Glucose-Capillary: 190 mg/dL — ABNORMAL HIGH (ref 65–99)
Glucose-Capillary: 170 mg/dL — ABNORMAL HIGH (ref 65–99)
Glucose-Capillary: 311 mg/dL — ABNORMAL HIGH (ref 65–99)
Glucose-Capillary: 259 mg/dL — ABNORMAL HIGH (ref 65–99)

## 2016-07-07 LAB — GLUCOSE, CAPILLARY
GLUCOSE-CAPILLARY: 430 mg/dL — AB (ref 65–99)
Glucose-Capillary: 108 mg/dL — ABNORMAL HIGH (ref 65–99)
Glucose-Capillary: 323 mg/dL — ABNORMAL HIGH (ref 65–99)
Glucose-Capillary: 398 mg/dL — ABNORMAL HIGH (ref 65–99)

## 2016-07-08 LAB — GLUCOSE, CAPILLARY
GLUCOSE-CAPILLARY: 130 mg/dL — AB (ref 65–99)
GLUCOSE-CAPILLARY: 230 mg/dL — AB (ref 65–99)
GLUCOSE-CAPILLARY: 305 mg/dL — AB (ref 65–99)
Glucose-Capillary: 366 mg/dL — ABNORMAL HIGH (ref 65–99)

## 2016-07-09 LAB — GLUCOSE, CAPILLARY
GLUCOSE-CAPILLARY: 107 mg/dL — AB (ref 65–99)
GLUCOSE-CAPILLARY: 345 mg/dL — AB (ref 65–99)
Glucose-Capillary: 171 mg/dL — ABNORMAL HIGH (ref 65–99)

## 2016-07-10 LAB — GLUCOSE, CAPILLARY
GLUCOSE-CAPILLARY: 56 mg/dL — AB (ref 65–99)
Glucose-Capillary: 72 mg/dL (ref 65–99)
Glucose-Capillary: 187 mg/dL — ABNORMAL HIGH (ref 65–99)

## 2016-07-11 ENCOUNTER — Encounter
Admission: RE | Admit: 2016-07-11 | Discharge: 2016-07-11 | Disposition: A | Discharge status: Home / Self Care | Payer: Medicare Other | Source: Ambulatory Visit | Attending: Internal Medicine | Admitting: Internal Medicine

## 2016-07-11 DIAGNOSIS — E1122 Type 2 diabetes mellitus with diabetic chronic kidney disease: Secondary | ICD-10-CM | POA: Insufficient documentation

## 2016-07-11 DIAGNOSIS — F0280 Dementia in other diseases classified elsewhere without behavioral disturbance: Secondary | ICD-10-CM | POA: Insufficient documentation

## 2016-07-11 DIAGNOSIS — G301 Alzheimer's disease with late onset: Secondary | ICD-10-CM | POA: Insufficient documentation

## 2016-07-11 DIAGNOSIS — I5022 Chronic systolic (congestive) heart failure: Secondary | ICD-10-CM | POA: Insufficient documentation

## 2016-07-11 DIAGNOSIS — N189 Chronic kidney disease, unspecified: Secondary | ICD-10-CM | POA: Insufficient documentation

## 2016-07-11 DIAGNOSIS — Z7901 Long term (current) use of anticoagulants: Secondary | ICD-10-CM | POA: Insufficient documentation

## 2016-07-11 DIAGNOSIS — Z79899 Other long term (current) drug therapy: Secondary | ICD-10-CM | POA: Insufficient documentation

## 2016-07-11 DIAGNOSIS — I130 Hypertensive heart and chronic kidney disease with heart failure and stage 1 through stage 4 chronic kidney disease, or unspecified chronic kidney disease: Secondary | ICD-10-CM | POA: Insufficient documentation

## 2016-07-17 ENCOUNTER — Non-Acute Institutional Stay (SKILLED_NURSING_FACILITY): Payer: Medicare Other | Admitting: Gerontology

## 2016-07-17 DIAGNOSIS — L308 Other specified dermatitis: Secondary | ICD-10-CM

## 2016-07-17 DIAGNOSIS — I5022 Chronic systolic (congestive) heart failure: Secondary | ICD-10-CM | POA: Diagnosis not present

## 2016-07-17 DIAGNOSIS — E1122 Type 2 diabetes mellitus with diabetic chronic kidney disease: Secondary | ICD-10-CM | POA: Diagnosis not present

## 2016-07-17 DIAGNOSIS — R1311 Dysphagia, oral phase: Secondary | ICD-10-CM

## 2016-07-17 DIAGNOSIS — E1139 Type 2 diabetes mellitus with other diabetic ophthalmic complication: Secondary | ICD-10-CM

## 2016-07-17 DIAGNOSIS — G301 Alzheimer's disease with late onset: Secondary | ICD-10-CM | POA: Diagnosis not present

## 2016-07-17 DIAGNOSIS — M5136 Other intervertebral disc degeneration, lumbar region: Secondary | ICD-10-CM

## 2016-07-17 DIAGNOSIS — E11319 Type 2 diabetes mellitus with unspecified diabetic retinopathy without macular edema: Secondary | ICD-10-CM | POA: Diagnosis not present

## 2016-07-17 DIAGNOSIS — H409 Unspecified glaucoma: Secondary | ICD-10-CM | POA: Diagnosis not present

## 2016-07-17 DIAGNOSIS — F329 Major depressive disorder, single episode, unspecified: Secondary | ICD-10-CM | POA: Diagnosis not present

## 2016-07-17 DIAGNOSIS — F0280 Dementia in other diseases classified elsewhere without behavioral disturbance: Secondary | ICD-10-CM | POA: Diagnosis not present

## 2016-07-17 DIAGNOSIS — E7800 Pure hypercholesterolemia, unspecified: Secondary | ICD-10-CM

## 2016-07-17 DIAGNOSIS — N400 Enlarged prostate without lower urinary tract symptoms: Secondary | ICD-10-CM

## 2016-07-17 DIAGNOSIS — K219 Gastro-esophageal reflux disease without esophagitis: Secondary | ICD-10-CM

## 2016-07-17 DIAGNOSIS — I10 Essential (primary) hypertension: Secondary | ICD-10-CM

## 2016-07-17 DIAGNOSIS — I110 Hypertensive heart disease with heart failure: Secondary | ICD-10-CM | POA: Diagnosis not present

## 2016-07-17 DIAGNOSIS — Z794 Long term (current) use of insulin: Secondary | ICD-10-CM

## 2016-07-17 DIAGNOSIS — I82502 Chronic embolism and thrombosis of unspecified deep veins of left lower extremity: Secondary | ICD-10-CM

## 2016-07-17 DIAGNOSIS — E1142 Type 2 diabetes mellitus with diabetic polyneuropathy: Secondary | ICD-10-CM | POA: Diagnosis not present

## 2016-07-17 NOTE — Progress Notes (Signed)
Location:      Place of Service:  SNF (31) Provider:  Toni Arthurs, NP-C  Dion Body, MD  Patient Care Team: Dion Body, MD as PCP - General (Family Medicine)  Extended Emergency Contact Information Primary Emergency Contact: Vida Rigger Address: [address]          [place], [place] [address] [place] Phone: [phone] Relation: Spouse  Code Status:  full Goals of care: Advanced Directive information Advanced Directives 04/06/2016  Does Patient Have a Medical Advance Directive? Yes  Type of Advance Directive Living will  Does patient want to make changes to medical advance directive? No - Patient declined  Copy of [facility] in Chart? -  Would patient like information on creating a medical advance directive? No - Patient declined     Chief Complaint  Patient presents with  . Medical Management of Chronic Issues    HPI:  Pt is a 83 y.o. male seen today for medical management of chronic diseases. Pt has been in the facility for long term care since February. Since them, his FSBS have been uncontrolled with current home regimen. Will change medications to obtain better control of the glucose and monitor for needed adjustments. Pt has CHF with chronic BLE pitting edema. L>R. + pulses in BLE. H/O DVTs. Negative Homan's sign in [place]. Pt does have dementia. Pt has remained safe without falls. No c/o pain. Depression well managed. No c/o n/v/d/f/c/cp/sob/ha/abd pain/dizziness/cough. Pt was smiling, pleasantly confused. Pt reports appetite is good and denies difficulty swallowing. Having regular BMs. However, his abdomen is rounded and taut. Will check liver values to r/o hepatic disease. Likely abdominal edema from CHF. VSS. No other complaints.     Past Medical History:  Diagnosis Date  . Arthritis   . Background retinopathy   . Cervicalgia   . Chronic kidney disease   . Chronic systolic CHF (congestive heart  failure) ([place])   . Degeneration of lumbar or lumbosacral intervertebral disc   . Dementia   . Diabetes mellitus without complication ([place])   . DVT (deep venous thrombosis) ([place])   . Generalized weakness   . Glaucoma   . Hypercholesteremia   . Hypertension   . Long-term insulin use ([place])   . Peripheral polyneuropathy ([place])   . Peripheral vascular disease ([place])   . Spinal stenosis    Past Surgical History:  Procedure Laterality Date  . c-spine fusion N/A     Allergies  Allergen Reactions  . Fosinopril Other (See Comments)    Reaction: Hyperkalemia     Allergies as of 07/17/2016      Reactions   Fosinopril Other (See Comments)   Reaction: Hyperkalemia       Medication List       Accurate as of 07/17/16  6:27 PM. Always use your most recent med list.          acetaminophen 325 MG tablet Commonly known as:  TYLENOL Take 650 mg by mouth every 6 (six) hours as needed for mild pain, moderate pain, fever or headache.   albuterol 108 (90 Base) MCG/ACT inhaler Commonly known as:  PROVENTIL HFA;VENTOLIN HFA Inhale 2 puffs into the lungs every 4 (four) hours as needed for wheezing or shortness of breath.   amLODipine 10 MG tablet Commonly known as:  NORVASC Take 1 tablet (10 mg total) by mouth daily.   amoxicillin-clavulanate 875-125 MG tablet Commonly known as:  AUGMENTIN Take 1 tablet by mouth 2 (two)  times daily.   atorvastatin 20 MG tablet Commonly known as:  LIPITOR Take 20 mg by mouth daily.   benzonatate 200 MG capsule Commonly known as:  TESSALON Take 200 mg by mouth 3 (three) times daily as needed for cough.   bisacodyl 5 MG EC tablet Commonly known as:  DULCOLAX Take 1 tablet (5 mg total) by mouth daily as needed for moderate constipation.   cholecalciferol 1000 units tablet Commonly known as:  VITAMIN D Take 1,000 Units by mouth daily.   donepezil 10 MG tablet Commonly known as:  ARICEPT Take 10 mg by mouth at bedtime.   DULoxetine 60 MG  capsule Commonly known as:  CYMBALTA Take 60 mg by mouth daily.   feeding supplement (ENSURE ENLIVE) Liqd Take 237 mLs by mouth 2 (two) times daily between meals.   finasteride 5 MG tablet Commonly known as:  PROSCAR Take 5 mg by mouth daily.   furosemide 20 MG tablet Commonly known as:  LASIX Take 40 mg by mouth daily. Take 2 tablets in the morning. Take an additional tablet 6 hours later.   HYDROcodone-acetaminophen 5-325 MG tablet Commonly known as:  NORCO/VICODIN Take 1 tablet by mouth every 6 (six) hours as needed for moderate pain.   insulin aspart 100 UNIT/ML injection Commonly known as:  novoLOG Inject 4 Units into the skin 3 (three) times daily with meals.   insulin detemir 100 UNIT/ML injection Commonly known as:  LEVEMIR Inject 0.1 mLs (10 Units total) into the skin 2 (two) times daily.   metoprolol tartrate 25 MG tablet Commonly known as:  LOPRESSOR Take 1 tablet (25 mg total) by mouth 2 (two) times daily.   NAMENDA XR 28 MG Cp24 24 hr capsule Generic drug:  memantine Take 28 mg by mouth daily.   pantoprazole 40 MG tablet Commonly known as:  PROTONIX Take 1 tablet (40 mg total) by mouth daily.   pregabalin 75 MG capsule Commonly known as:  LYRICA Take 1 capsule (75 mg total) by mouth 2 (two) times daily.   PROSTATE SR 160-250 MG Caps Take 1 capsule by mouth daily.   senna 8.6 MG Tabs tablet Commonly known as:  SENOKOT Take 1 tablet (8.6 mg total) by mouth daily.   STOOL SOFTENER 100 MG capsule Generic drug:  docusate sodium Take 100 mg by mouth daily as needed for mild constipation or moderate constipation.   timolol 0.5 % ophthalmic solution Commonly known as:  TIMOPTIC Place 1 drop into both eyes 2 (two) times daily.   XARELTO 20 MG Tabs tablet Generic drug:  rivaroxaban Take 1 tablet by mouth daily.       Review of Systems  Unable to perform ROS: Dementia  Constitutional: Negative for activity change, appetite change, chills,  diaphoresis and fever.  HENT: Negative for congestion, sneezing, sore throat, trouble swallowing and voice change.   Eyes: Negative.   Respiratory: Negative for apnea, cough, choking, chest tightness, shortness of breath and wheezing.   Cardiovascular: Negative for chest pain, palpitations and leg swelling.  Gastrointestinal: Negative for abdominal distention, abdominal pain, constipation, diarrhea and nausea.  Genitourinary: Negative for difficulty urinating, dysuria, frequency and urgency.  Musculoskeletal: Negative for back pain, gait problem and myalgias. Arthralgias: typical arthritis.  Skin: Negative for color change, pallor, rash and wound.  Neurological: Positive for weakness. Negative for dizziness, tremors, syncope, speech difficulty, numbness and headaches.  Psychiatric/Behavioral: Negative for agitation and behavioral problems.  All other systems reviewed and are negative.   Immunization History  Administered Date(s) Administered  . Influenza,inj,Quad PF,36+ Mos 11/11/2015   Pertinent  Health Maintenance Due  Topic Date Due  . FOOT EXAM  07/16/1941  . OPHTHALMOLOGY EXAM  07/16/1941  . URINE MICROALBUMIN  07/16/1941  . PNA vac Low Risk Adult (1 of 2 - PCV13) 07/16/1998  . HEMOGLOBIN A1C  03/08/2015  . INFLUENZA VACCINE  09/10/2016   No flowsheet data found. Functional Status Survey:    Vitals:   07/11/16 0800  BP: 140/67  Pulse: 88  Resp: 20  Temp: (!) 96.7 F (35.9 C)  SpO2: 98%  Weight: 188 lb 4.8 oz (85.4 kg)   Body mass index is 26.26 kg/m. Physical Exam  Constitutional: Vital signs are normal. He appears well-developed and well-nourished. He is active and cooperative. He does not appear ill. No distress.  HENT:  Head: Normocephalic and atraumatic.  Mouth/Throat: Uvula is midline, oropharynx is clear and moist and mucous membranes are normal. Mucous membranes are not pale, not dry and not cyanotic.  Eyes: Conjunctivae, EOM and lids are normal. Pupils are  equal, round, and reactive to light.  Neck: Trachea normal, normal range of motion and full passive range of motion without pain. Neck supple. No JVD present. No tracheal deviation, no edema and no erythema present. No thyromegaly present.  Cardiovascular: Normal rate, regular rhythm, normal heart sounds, intact distal pulses and normal pulses.  Exam reveals no gallop, no distant heart sounds and no friction rub.   No murmur heard. Pulses:      Dorsalis pedis pulses are 2+ on the right side, and 2+ on the left side.  Pulmonary/Chest: Effort normal and breath sounds normal. No accessory muscle usage. No respiratory distress. He has no wheezes. He has no rhonchi. He has no rales. He exhibits no tenderness.  Abdominal: Normal appearance. He exhibits no distension and no ascites. Bowel sounds are decreased. There is no tenderness. There is no CVA tenderness.  Abdomen distended and taut. Non-tender. Sluggish bowel sounds.  Musculoskeletal: Normal range of motion. He exhibits no edema or tenderness.  Expected osteoarthritis, stiffness  Neurological: He is alert. He has normal strength. He is disoriented (to place).  Skin: Skin is warm, dry and intact. No rash noted. He is not diaphoretic. No cyanosis or erythema. No pallor. Nails show no clubbing.  Psychiatric: He has a normal mood and affect. His speech is normal and behavior is normal. Judgment and thought content normal. Cognition and memory are normal.  Nursing note and vitals reviewed.   Labs reviewed:  Recent Labs  10/17/15 1434  11/13/15 0426  04/05/16 1848 04/06/16 0431 04/07/16 0327 04/08/16 0310 04/24/16 1340  NA 135  < > 139  < > 133* 139  --   --  133*  K 4.4  < > 3.2*  < > 4.6 3.9  --   --  4.9  CL 99*  < > 106  < > 99* 107  --   --  98*  CO2 26  < > 26  < > 26 27  --   --  26  GLUCOSE 124*  < > 100*  < > 202* 144*  --   --  408*  BUN 30*  < > 23*  < > 26* 23*  --   --  41*  CREATININE 2.17*  < > 1.61*  < > 1.82* 1.38* 1.61*  1.43* 1.83*  CALCIUM 8.9  < > 9.0  < > 9.0 8.3*  --   --  9.1  MG 2.0  --  1.9  --   --   --   --   --   --   < > = values in this interval not displayed.  Recent Labs  11/10/15 0326 04/05/16 1848 04/24/16 1340  AST _0 ALT 12* 18 33  ALKPHOS 67 66 92  BILITOT 0.7 0.5 0.2*  PROT 7.1 7.9 7.8  ALBUMIN 2.7* 3.4* 3.0*    Recent Labs  10/17/15 1434  11/09/15 1549  04/06/16 0431 04/08/16 0310 04/24/16 1340  WBC 10.9*  < > 13.7*  < > 10.8* 5.2 6.8  NEUTROABS 9.4*  --  11.9*  --   --   --  4.9  HGB 11.5*  < > 11.7*  < > 9.8* 10.5* 10.3*  HCT 33.5*  < > 34.3*  < > 28.7* 30.1* 30.2*  MCV 92.0  < > 90.1  < > 92.8 91.7 92.4  PLT 276  < > 275  < > 221 243 322  < > = values in this interval not displayed. Lab Results  Component Value Date   TSH 2.074 12/28/2014   Lab Results  Component Value Date   HGBA1C 9.5 (H) 09/05/2014   No results found for: CHOL, HDL, LDLCALC, LDLDIRECT, TRIG, CHOLHDL  Significant Diagnostic Results in last 30 days:  No results found.  Assessment/Plan 1. Late onset Alzheimer's disease without behavioral disturbance 2. Dementia in other diseases classified elsewhere without behavioral disturbance  Stable  Continue Donepezil 10 mg po Q HS  Continue Namenda 28 mg XR po Q Day  3. Other intervertebral disc degeneration, lumbar region  Stable  Continue Tylenol 650 mg po Q 4 hours prn pain  4. Glaucoma, unspecified glaucoma type, unspecified laterality  Stable   Continue Timolol 0.5%- 1 drop in both eyes BID  5. Type 2 diabetes mellitus with retinopathy without macular edema, with long-term current use of insulin, unspecified laterality, unspecified retinopathy severity ([place]) 6. Type 2 diabetes mellitus with other diabetic ophthalmic complication (H[facility]) 7. Type 2 diabetes mellitus with chronic kidney disease, with long-term current use of insulin, unspecified CKD stage ([place]) 8. Type 2 diabetes mellitus with diabetic polyneuropathy, with  long-term current use of insulin (H[facility])  Stable  Toujeo 65 units SQ in the AM  Novolin R- SSI prn  FSBS AC/HS  Continue Lyrica 75 mg po BID  9. 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 Hypertensive heart disease with heart failure (H[facility])  Stable  Add Entresto 24-25 1 tablet po BID  11. Major depressive disorder with single episode, remission status unspecified  Stable  Continue Cholecalciferol 1,000 units po Q Day  Continue Duloxetine 60 mg po Q Day  12. Chronic systolic (congestive) heart failure (H[facility])  Stable  Continue Furosemide 40 mg po Q AM  Continue Furosemide 20 mg po Q afternoon  13. Benign prostatic hyperplasia without lower urinary tract symptoms  Stable  Continue Finasteride 5 mg po Q Day  Continue Prostate SR 160-250 po Q Day  14. Pure hypercholesterolemia, unspecified  Stable  Continue Atorvastatin 20 mg po Q Day  15. Gastroesophageal reflux disease without esophagitis  Stable  Continue Protonix 40 mg po Q Day  16. Dysphagia, oral phase  Stable  Continue Glucerna 1 can BID  17. Essential hypertension  Stable  Continue Amlodipine 10 mg po Q Day  Continue Metoprolol  25 mg po BID  18. Dermatitis associated with moisture  Boudreaux's Butt Paste- liberal amount to area of skin irritation BID and prn   Family/ staff Communication:   Total Time:  Documentation:  Face to Face:  Family/Phone:   Labs/tests ordered:  Cbc, met c, lipid panel, Mag+, TSH, Vit B12, Vit D, amylase, lipase, BNP  Medication list reviewed and assessed for continued appropriateness. Monthly medication orders reviewed and signed.  Vikki Ports, NP-C Geriatrics [facility] [phone], [place] [address] Cell Phone (Mon-Fri 8am-5pm):  [phone] On Call:  [phone] & follow prompts after 5pm &  weekends Office Phone:  [phone] Office Fax:  [phone]

## 2016-07-18 DIAGNOSIS — F0280 Dementia in other diseases classified elsewhere without behavioral disturbance: Secondary | ICD-10-CM | POA: Diagnosis not present

## 2016-07-18 DIAGNOSIS — I5022 Chronic systolic (congestive) heart failure: Secondary | ICD-10-CM | POA: Diagnosis not present

## 2016-07-18 DIAGNOSIS — Z7901 Long term (current) use of anticoagulants: Secondary | ICD-10-CM | POA: Diagnosis not present

## 2016-07-18 DIAGNOSIS — Z79899 Other long term (current) drug therapy: Secondary | ICD-10-CM | POA: Diagnosis not present

## 2016-07-18 DIAGNOSIS — I130 Hypertensive heart and chronic kidney disease with heart failure and stage 1 through stage 4 chronic kidney disease, or unspecified chronic kidney disease: Secondary | ICD-10-CM | POA: Diagnosis not present

## 2016-07-18 DIAGNOSIS — E1122 Type 2 diabetes mellitus with diabetic chronic kidney disease: Secondary | ICD-10-CM | POA: Diagnosis not present

## 2016-07-18 DIAGNOSIS — N189 Chronic kidney disease, unspecified: Secondary | ICD-10-CM | POA: Diagnosis not present

## 2016-07-18 DIAGNOSIS — G301 Alzheimer's disease with late onset: Secondary | ICD-10-CM | POA: Diagnosis not present

## 2016-07-18 LAB — COMPREHENSIVE METABOLIC PANEL
ALK PHOS: 83 U/L (ref 38–126)
ALT: 80 U/L — AB (ref 17–63)
ANION GAP: 8 (ref 5–15)
AST: 46 U/L — ABNORMAL HIGH (ref 15–41)
Albumin: 3.2 g/dL — ABNORMAL LOW (ref 3.5–5.0)
BILIRUBIN TOTAL: 0.6 mg/dL (ref 0.3–1.2)
BUN: 35 mg/dL — ABNORMAL HIGH (ref 6–20)
CALCIUM: 9 mg/dL (ref 8.9–10.3)
CO2: 28 mmol/L (ref 22–32)
CREATININE: 1.95 mg/dL — AB (ref 0.61–1.24)
Chloride: 102 mmol/L (ref 101–111)
GFR calc Af Amer: 35 mL/min — ABNORMAL LOW (ref >60)
GFR calc non Af Amer: 30 mL/min — ABNORMAL LOW (ref >60)
Glucose, Bld: 174 mg/dL — ABNORMAL HIGH (ref 65–99)
Potassium: 4.2 mmol/L (ref 3.5–5.1)
SODIUM: 138 mmol/L (ref 135–145)
TOTAL PROTEIN: 7.5 g/dL (ref 6.5–8.1)

## 2016-07-18 LAB — CBC WITH DIFFERENTIAL/PLATELET
Basophils Absolute: 0 10*3/uL (ref 0–0.1)
Basophils Relative: 1 %
Eosinophils Absolute: 0.1 10*3/uL (ref 0–0.7)
Eosinophils Relative: 3 %
HEMATOCRIT: 30.5 % — AB (ref 40.0–52.0)
HEMOGLOBIN: 10.3 g/dL — AB (ref 13.0–18.0)
LYMPHS ABS: 1 10*3/uL (ref 1.0–3.6)
Lymphocytes Relative: 22 %
MCH: 31.8 pg (ref 26.0–34.0)
MCHC: 33.8 g/dL (ref 32.0–36.0)
MCV: 93.9 fL (ref 80.0–100.0)
MONOS PCT: 10 %
Monocytes Absolute: 0.5 10*3/uL (ref 0.2–1.0)
NEUTROS ABS: 3 10*3/uL (ref 1.4–6.5)
NEUTROS PCT: 64 %
Platelets: 219 10*3/uL (ref 150–440)
RBC: 3.24 MIL/uL — AB (ref 4.40–5.90)
RDW: 16.5 % — ABNORMAL HIGH (ref 11.5–14.5)
WBC: 4.7 10*3/uL (ref 3.8–10.6)

## 2016-07-18 LAB — BRAIN NATRIURETIC PEPTIDE: B Natriuretic Peptide: 43 pg/mL (ref 0.0–100.0)

## 2016-07-18 LAB — LIPID PANEL
Cholesterol: 177 mg/dL (ref 0–200)
HDL: 34 mg/dL — AB (ref >40)
LDL CALC: 122 mg/dL — AB (ref 0–99)
Total CHOL/HDL Ratio: 5.2 RATIO
Triglycerides: 105 mg/dL (ref <150)
VLDL: 21 mg/dL (ref 0–40)

## 2016-07-18 LAB — MAGNESIUM: MAGNESIUM: 2.2 mg/dL (ref 1.7–2.4)

## 2016-07-18 LAB — AMYLASE: AMYLASE: 123 U/L — AB (ref 28–100)

## 2016-07-18 LAB — LIPASE, BLOOD: LIPASE: 29 U/L (ref 11–51)

## 2016-07-18 LAB — TSH: TSH: 4.41 u[IU]/mL (ref 0.350–4.500)

## 2016-07-18 LAB — VITAMIN B12: VITAMIN B 12: 789 pg/mL (ref 180–914)

## 2016-07-19 LAB — VITAMIN D 25 HYDROXY (VIT D DEFICIENCY, FRACTURES): Vit D, 25-Hydroxy: 35.1 ng/mL (ref 30.0–100.0)

## 2016-08-10 ENCOUNTER — Encounter
Admission: RE | Admit: 2016-08-10 | Discharge: 2016-08-10 | Disposition: A | Discharge status: Home / Self Care | Payer: Medicare Other | Source: Ambulatory Visit | Attending: Internal Medicine | Admitting: Internal Medicine

## 2016-08-26 ENCOUNTER — Non-Acute Institutional Stay (SKILLED_NURSING_FACILITY): Payer: Medicare Other | Admitting: Gerontology

## 2016-08-26 DIAGNOSIS — I110 Hypertensive heart disease with heart failure: Secondary | ICD-10-CM | POA: Diagnosis not present

## 2016-08-26 DIAGNOSIS — E1122 Type 2 diabetes mellitus with diabetic chronic kidney disease: Secondary | ICD-10-CM

## 2016-08-26 DIAGNOSIS — Z794 Long term (current) use of insulin: Secondary | ICD-10-CM | POA: Diagnosis not present

## 2016-08-27 ENCOUNTER — Encounter: Payer: Self-pay | Admitting: Gerontology

## 2016-08-27 NOTE — Progress Notes (Signed)
Location:    [facility] Room Number: 352P Place of Service:  SNF [id number]) Provider:  Lorenso Quarry, NP-C  Einar Crow, MD  Patient Care Team: Einar Crow, MD as PCP - General (Family Medicine)  Extended Emergency Contact Information Primary Emergency Contact: [name] Address: [phone]          [place], [place] [address] [place] Home Phone: [phone] Relation: Spouse  Code Status:  Full Goals of care: Advanced Directive information Advanced Directives 08/26/2016  Does Patient Have a Medical Advance Directive? No  Type of Advance Directive -  Does patient want to make changes to medical advance directive? -  Copy of Healthcare Power of Attorney in Chart? -  Would patient like information on creating a medical advance directive? -     Chief Complaint  Patient presents with  . Acute Visit    Follow up on congestion    HPI:  Pt is a 83 y.o. male seen today for an acute visit for congestion and hyperglycemia. I received a call over the weekend the patient was c/o congested cough with thin, watery secretions. Pt was afebrile. No s/s of aspiration. Staff also reported 2-3+ B- pedal edema. I gave verbal order for IM Lasix and 2 view CXR. Today, pt seems to be back to baseline. No edema of the legs/ feet. Pt reports he is feeling well. Denies cough or congestion. Pt was pre-occupied with lunch and not interested in answering many questions. Staff reports he seems back to baseline. FSBS continue trending high. VSS. No other complaints.     Past Medical History:  Diagnosis Date  . Arthritis   . Background retinopathy   . Cervicalgia   . Chronic kidney disease    follow up with a Dr.   . Chronic systolic CHF (congestive heart failure) (HCC)   . Congenital heart failure (HCC)   . Degeneration of lumbar or lumbosacral intervertebral disc   . Dementia   . Diabetes mellitus type 2, uncomplicated (HCC) 1996   on  insulin since 2011, last eye exam with Dr. Inez Pilgrim 07/2013  . Diabetes mellitus without complication (HCC)   . DVT (deep venous thrombosis) (HCC)   . Essential hypertension    unspecified  . Generalized weakness   . Glaucoma   . Hypercholesteremia   . Hypertension   . Long-term insulin use (HCC)   . Moderate dementia   . Peripheral polyneuropathy   . Peripheral vascular disease (HCC)   . Pneumonia, unspecified organism 04/01/2014  . Pure hypercholesterolemia   . Spinal stenosis   . Spinal stenosis, lumbar region without neurogenic claudication    Past Surgical History:  Procedure Laterality Date  . ANGIOPLASTY / STENTING FEMORAL  1999  . ANTERIOR FUSION CERVICAL SPINE  07/2009  . c-spine fusion N/A   . COLONOSCOPY  05/23/2004   Dr. Maryruth Bun, normal, PH colon polpys    Allergies  Allergen Reactions  . Fosinopril Other (See Comments)    Other reaction(s): Other (See Comments) Hyperkalemia Reaction: Hyperkalemia     Allergies as of 08/26/2016      Reactions   Fosinopril Other (See Comments)   Reaction: Hyperkalemia       Medication List       Accurate as of 08/26/16 11:59 PM. Always use your most recent med list.          acetaminophen 325 MG tablet Commonly known as:  TYLENOL Take 650 mg by mouth every  6 (six) hours as needed for mild pain, moderate pain, fever or headache.   albuterol 108 (90 Base) MCG/ACT inhaler Commonly known as:  PROVENTIL HFA;VENTOLIN HFA Inhale 2 puffs into the lungs every 4 (four) hours as needed for wheezing or shortness of breath.   amLODipine 10 MG tablet Commonly known as:  NORVASC Take 1 tablet (10 mg total) by mouth daily.   benzonatate 100 MG capsule Commonly known as:  TESSALON Take 200 mg by mouth every 8 (eight) hours as needed for cough.   bisacodyl 5 MG EC tablet Commonly known as:  DULCOLAX Take 1 tablet (5 mg total) by mouth daily as needed for moderate constipation.   BOUDREAUXS BUTT PASTE 16 % Oint Generic drug:   Zinc Oxide Apply liberal amount topically to area of skin breakdown by mouth 2 times daily and as needed. Ok to leave at bedside   cholecalciferol 1000 units tablet Commonly known as:  VITAMIN D Take 1,000 Units by mouth daily.   donepezil 10 MG tablet Commonly known as:  ARICEPT Take 10 mg by mouth at bedtime.   DULoxetine 60 MG capsule Commonly known as:  CYMBALTA Take 60 mg by mouth daily.   ENTRESTO 24-26 MG Generic drug:  sacubitril-valsartan Take 1 tablet by mouth 2 (two) times daily.   finasteride 5 MG tablet Commonly known as:  PROSCAR Take 5 mg by mouth daily. *staff : do not handle without gloves*   furosemide 20 MG tablet Commonly known as:  LASIX Take 2 tablets (40 mg) by mouth daily each morning. Take an additional 1 tablet (20 mg) by mouth  6 hours later.   GLUCERNA Liqd Take 1 Can by mouth 2 (two) times daily between meals.   HYDROcodone-acetaminophen 5-325 MG tablet Commonly known as:  NORCO/VICODIN Take 1 tablet by mouth every 6 (six) hours as needed for moderate pain.   metoprolol tartrate 25 MG tablet Commonly known as:  LOPRESSOR Take 1 tablet (25 mg total) by mouth 2 (two) times daily.   NAMENDA XR 28 MG Cp24 24 hr capsule Generic drug:  memantine Take 28 mg by mouth daily.   NOVOLIN R 100 units/mL injection Generic drug:  insulin regular Inject 3-12 Units into the skin daily as needed for high blood sugar. Per sliding Scale If BS is less than 60 call NP/PA. If BS is 175 - 250 give 3 u, If BS is 251 - 325 give 6 units, If BS is 326 - 450, give 10 units, If BS is > 450 give 12 units and call NP/PA.   pantoprazole 40 MG tablet Commonly known as:  PROTONIX Take 40 mg by mouth daily.   PROSTATE SR 160-250 MG Caps Take 1 capsule by mouth daily.   senna 8.6 MG Tabs tablet Commonly known as:  SENOKOT Take 1 tablet (8.6 mg total) by mouth daily.   STOOL SOFTENER 100 MG capsule Generic drug:  docusate sodium Take 100 mg by mouth daily as needed  for mild constipation or moderate constipation.   timolol 0.5 % ophthalmic solution Commonly known as:  TIMOPTIC Place 1 drop into both eyes 2 (two) times daily.   TOUJEO SOLOSTAR 300 UNIT/ML Sopn Generic drug:  Insulin Glargine Inject 67 Units into the skin every morning. 8 am   XARELTO 20 MG Tabs tablet Generic drug:  rivaroxaban Take 1 tablet by mouth daily.       Review of Systems  Unable to perform ROS: Dementia  Constitutional: Negative for activity change, appetite  change, chills, diaphoresis and fever.  HENT: Negative for congestion, sneezing, sore throat, trouble swallowing and voice change.   Eyes: Negative.   Respiratory: Negative for apnea, cough, choking, chest tightness, shortness of breath and wheezing.   Cardiovascular: Negative for chest pain, palpitations and leg swelling.  Gastrointestinal: Negative for abdominal distention, abdominal pain, constipation, diarrhea and nausea.  Genitourinary: Negative for difficulty urinating, dysuria, frequency and urgency.  Musculoskeletal: Negative for back pain, gait problem and myalgias. Arthralgias: typical arthritis.  Skin: Negative for color change, pallor, rash and wound.  Neurological: Positive for weakness. Negative for dizziness, tremors, syncope, speech difficulty, numbness and headaches.  Psychiatric/Behavioral: Negative for agitation and behavioral problems.  All other systems reviewed and are negative.   Immunization History  Administered Date(s) Administered  . Influenza Split 12/04/2014  . Influenza,inj,Quad PF,36+ Mos 11/11/2015  . Influenza-Unspecified 11/17/2011, 12/04/2014  . PPD Test 02/25/2014, 03/11/2014, 04/08/2016  . Pneumococcal Polysaccharide-23 02/11/2008   Pertinent  Health Maintenance Due  Topic Date Due  . FOOT EXAM  07/16/1941  . OPHTHALMOLOGY EXAM  07/16/1941  . URINE MICROALBUMIN  07/16/1941  . PNA vac Low Risk Adult (1 of 2 - PCV13) 07/16/1998  . HEMOGLOBIN A1C  03/08/2015  .  INFLUENZA VACCINE  09/10/2016   No flowsheet data found. Functional Status Survey:    Vitals:   08/26/16 0848  BP: (!) 147/62  Pulse: 82  Resp: 18  Temp: 97.7 F (36.5 C)  SpO2: 99%  Weight: 187 lb 9.6 oz (85.1 kg)  Height: 5\' 6"  (1.676 m)   Body mass index is 30.28 kg/m. Physical Exam  Constitutional: Vital signs are normal. He appears well-developed and well-nourished. He is active and cooperative. He does not appear ill. No distress.  HENT:  Head: Normocephalic and atraumatic.  Mouth/Throat: Uvula is midline, oropharynx is clear and moist and mucous membranes are normal. Mucous membranes are not pale, not dry and not cyanotic.  Eyes: Pupils are equal, round, and reactive to light. Conjunctivae, EOM and lids are normal.  Neck: Trachea normal, normal range of motion and full passive range of motion without pain. Neck supple. No JVD present. No tracheal deviation, no edema and no erythema present. No thyromegaly present.  Cardiovascular: Normal rate, regular rhythm, normal heart sounds, intact distal pulses and normal pulses.  Exam reveals no gallop, no distant heart sounds and no friction rub.   No murmur heard. Pulses:      Dorsalis pedis pulses are 2+ on the right side, and 2+ on the left side.  Pulmonary/Chest: Effort normal and breath sounds normal. No accessory muscle usage. No respiratory distress. He has no wheezes. He has no rhonchi. He has no rales. He exhibits no tenderness.  Abdominal: Normal appearance. He exhibits no distension and no ascites. Bowel sounds are decreased. There is no tenderness. There is no CVA tenderness.  Abdomen distended and taut. Non-tender. Sluggish bowel sounds.  Musculoskeletal: Normal range of motion. He exhibits no edema or tenderness.  Expected osteoarthritis, stiffness  Neurological: He is alert. He has normal strength. He is disoriented (to place).  Skin: Skin is warm, dry and intact. No rash noted. He is not diaphoretic. No cyanosis or  erythema. No pallor. Nails show no clubbing.  Psychiatric: He has a normal mood and affect. His speech is normal and behavior is normal. Judgment and thought content normal. Cognition and memory are normal.  Nursing note and vitals reviewed.   Labs reviewed:  Recent Labs  10/17/15 1434  11/13/15 0426  04/06/16 0431  04/08/16 0310 04/24/16 1340 07/18/16 1100  NA 135  < > 139  < > 139  --   --  133* 138  K 4.4  < > 3.2*  < > 3.9  --   --  4.9 4.2  CL 99*  < > 106  < > 107  --   --  98* 102  CO2 26  < > 26  < > 27  --   --  26 28  GLUCOSE 124*  < > 100*  < > 144*  --   --  408* 174*  BUN 30*  < > 23*  < > 23*  --   --  41* 35*  CREATININE 2.17*  < > 1.61*  < > 1.38*  < > 1.43* 1.83* 1.95*  CALCIUM 8.9  < > 9.0  < > 8.3*  --   --  9.1 9.0  MG 2.0  --  1.9  --   --   --   --   --  2.2  < > = values in this interval not displayed.  Recent Labs  04/05/16 1848 04/24/16 1340 07/18/16 1100  AST 27 24 46*  ALT 18 33 80*  ALKPHOS 66 92 83  BILITOT 0.5 0.2* 0.6  PROT 7.9 7.8 7.5  ALBUMIN 3.4* 3.0* 3.2*    Recent Labs  11/09/15 1549  04/08/16 0310 04/24/16 1340 07/18/16 1100  WBC 13.7*  < > 5.2 6.8 4.7  NEUTROABS 11.9*  --   --  4.9 3.0  HGB 11.7*  < > 10.5* 10.3* 10.3*  HCT 34.3*  < > 30.1* 30.2* 30.5*  MCV 90.1  < > 91.7 92.4 93.9  PLT 275  < > 243 322 219  < > = values in this interval not displayed. Lab Results  Component Value Date   TSH 4.410 07/18/2016   Lab Results  Component Value Date   HGBA1C 9.5 (H) 09/05/2014   Lab Results  Component Value Date   CHOL 177 07/18/2016   HDL 34 (L) 07/18/2016   LDLCALC 122 (H) 07/18/2016   TRIG 105 07/18/2016   CHOLHDL 5.2 07/18/2016    Significant Diagnostic Results in last 30 days:  No results found.  Assessment/Plan 1. Hypertensive heart disease with heart failure  Lasix 40 mg IM x 1 (on 7/15)  CXR (7/15)  Increase Entresto to 49-51 mg I tablet po BID  2. Type 2 diabetes mellitus with chronic kidney  disease, with long-term current use of insulin, unspecified CKD stage (HCC)  Increase Toujeo to 67 units SQ Q Day  Continue FSBS AC/HS  Continue HS snack  Family/ staff Communication:   Total Time:  Documentation:  Face to Face:  Family/Phone:   Labs/tests ordered:    Medication list reviewed and assessed for continued appropriateness.  Brynda Rim, NP-C Geriatrics [facility] [phone] [address], [place] [address] Cell Phone (Mon-Fri 8am-5pm):  [phone] On Call:  [phone] & follow prompts after 5pm & weekends Office Phone:  [phone] Office Fax:  [phone]

## 2016-09-09 ENCOUNTER — Non-Acute Institutional Stay (SKILLED_NURSING_FACILITY): Payer: Medicare Other | Admitting: Gerontology

## 2016-09-09 ENCOUNTER — Encounter: Payer: Self-pay | Admitting: Gerontology

## 2016-09-09 DIAGNOSIS — I110 Hypertensive heart disease with heart failure: Secondary | ICD-10-CM | POA: Diagnosis not present

## 2016-09-09 DIAGNOSIS — K219 Gastro-esophageal reflux disease without esophagitis: Secondary | ICD-10-CM

## 2016-09-09 DIAGNOSIS — H409 Unspecified glaucoma: Secondary | ICD-10-CM | POA: Diagnosis not present

## 2016-09-09 DIAGNOSIS — F329 Major depressive disorder, single episode, unspecified: Secondary | ICD-10-CM | POA: Diagnosis not present

## 2016-09-09 DIAGNOSIS — M5136 Other intervertebral disc degeneration, lumbar region: Secondary | ICD-10-CM

## 2016-09-09 DIAGNOSIS — I5022 Chronic systolic (congestive) heart failure: Secondary | ICD-10-CM | POA: Diagnosis not present

## 2016-09-09 DIAGNOSIS — N400 Enlarged prostate without lower urinary tract symptoms: Secondary | ICD-10-CM

## 2016-09-09 DIAGNOSIS — G301 Alzheimer's disease with late onset: Secondary | ICD-10-CM

## 2016-09-09 DIAGNOSIS — R1311 Dysphagia, oral phase: Secondary | ICD-10-CM

## 2016-09-09 DIAGNOSIS — E7800 Pure hypercholesterolemia, unspecified: Secondary | ICD-10-CM

## 2016-09-09 DIAGNOSIS — E11319 Type 2 diabetes mellitus with unspecified diabetic retinopathy without macular edema: Secondary | ICD-10-CM | POA: Diagnosis not present

## 2016-09-09 DIAGNOSIS — I10 Essential (primary) hypertension: Secondary | ICD-10-CM

## 2016-09-09 DIAGNOSIS — I82502 Chronic embolism and thrombosis of unspecified deep veins of left lower extremity: Secondary | ICD-10-CM

## 2016-09-09 DIAGNOSIS — F0280 Dementia in other diseases classified elsewhere without behavioral disturbance: Secondary | ICD-10-CM

## 2016-09-09 DIAGNOSIS — M51369 Other intervertebral disc degeneration, lumbar region without mention of lumbar back pain or lower extremity pain: Secondary | ICD-10-CM

## 2016-09-09 DIAGNOSIS — E1122 Type 2 diabetes mellitus with diabetic chronic kidney disease: Secondary | ICD-10-CM

## 2016-09-09 DIAGNOSIS — E1142 Type 2 diabetes mellitus with diabetic polyneuropathy: Secondary | ICD-10-CM

## 2016-09-09 DIAGNOSIS — Z794 Long term (current) use of insulin: Secondary | ICD-10-CM

## 2016-09-09 DIAGNOSIS — E1139 Type 2 diabetes mellitus with other diabetic ophthalmic complication: Secondary | ICD-10-CM

## 2016-09-09 DIAGNOSIS — L308 Other specified dermatitis: Secondary | ICD-10-CM

## 2016-09-09 NOTE — Progress Notes (Signed)
Location:   [facility] Room Number: 212A Place of Service:  SNF [phone]) Provider:  Lorenso Quarry, NP-C  Marisue Ivan, MD  Patient Care Team: Marisue Ivan, MD as PCP - General (Family Medicine)  Extended Emergency Contact Information Primary Emergency Contact: [name] Address: [phone]          [place], [place] [address] [place] Home Phone: [phone] Relation: Spouse  Code Status:  FULL Goals of care: Advanced Directive information Advanced Directives 09/09/2016  Does Patient Have a Medical Advance Directive? No  Type of Advance Directive -  Does patient want to make changes to medical advance directive? -  Copy of Healthcare Power of Attorney in Chart? -  Would patient like information on creating a medical advance directive? -     Chief Complaint  Patient presents with  . Medical Management of Chronic Issues    Routine Visit    HPI:  Pt is a 83 y.o. male seen today for medical management of chronic diseases. Pt has been in the facility for long term care since February. Since them, his FSBS have been better controlled with Toujeo Insulin. Pt has CHF with chronic BLE pitting edema. L>R. + pulses in BLE. H/O DVTs. Negative Homan's sign in B-calves. Pt does have dementia. Pt has remained safe without falls. No c/o pain. Depression well managed. No c/o n/v/d/f/c/cp/sob/ha/abd pain/dizziness/cough. Pt was smiling, pleasantly confused. Pt reports appetite is good and denies difficulty swallowing. Having regular BMs. VSS. No other complaints.     Past Medical History:  Diagnosis Date  . Arthritis   . Background retinopathy   . Cervicalgia   . Chronic kidney disease    follow up with a Dr.   . Chronic systolic CHF (congestive heart failure) (HCC)   . Congenital heart failure (HCC)   . Degeneration of lumbar or lumbosacral intervertebral disc   . Dementia   . Diabetes mellitus type 2, uncomplicated (HCC)  1996   on insulin since 2011, last eye exam with Dr. Inez Pilgrim 07/2013  . Diabetes mellitus without complication (HCC)   . DVT (deep venous thrombosis) (HCC)   . Essential hypertension    unspecified  . Generalized weakness   . Glaucoma   . Hypercholesteremia   . Hypertension   . Long-term insulin use (HCC)   . Moderate dementia   . Peripheral polyneuropathy   . Peripheral vascular disease (HCC)   . Pneumonia, unspecified organism 04/01/2014  . Pure hypercholesterolemia   . Spinal stenosis   . Spinal stenosis, lumbar region without neurogenic claudication    Past Surgical History:  Procedure Laterality Date  . ANGIOPLASTY / STENTING FEMORAL  1999  . ANTERIOR FUSION CERVICAL SPINE  07/2009  . c-spine fusion N/A   . COLONOSCOPY  05/23/2004   Dr. Maryruth Bun, normal, PH colon polpys    Allergies  Allergen Reactions  . Fosinopril Other (See Comments)    Other reaction(s): Other (See Comments) Hyperkalemia Reaction: Hyperkalemia     Allergies as of 09/09/2016      Reactions   Fosinopril Other (See Comments)   Other reaction(s): Other (See Comments) Hyperkalemia Reaction: Hyperkalemia       Medication List       Accurate as of 09/09/16 10:20 AM. Always use your most recent med list.          acetaminophen 325 MG tablet Commonly known as:  TYLENOL Take 650 mg by mouth every 6 (six) hours as needed  for mild pain, moderate pain, fever or headache.   albuterol 108 (90 Base) MCG/ACT inhaler Commonly known as:  PROVENTIL HFA;VENTOLIN HFA Inhale 2 puffs into the lungs every 4 (four) hours as needed for wheezing or shortness of breath.   amLODipine 10 MG tablet Commonly known as:  NORVASC Take 1 tablet (10 mg total) by mouth daily.   benzonatate 100 MG capsule Commonly known as:  TESSALON Take 200 mg by mouth every 8 (eight) hours as needed for cough.   bisacodyl 5 MG EC tablet Commonly known as:  DULCOLAX Take 1 tablet (5 mg total) by mouth daily as needed for  moderate constipation.   BOUDREAUXS BUTT PASTE 16 % Oint Generic drug:  Zinc Oxide Apply liberal amount topically to area of skin breakdown  2 times daily and as needed. Ok to leave at bedside   cholecalciferol 1000 units tablet Commonly known as:  VITAMIN D Take 1,000 Units by mouth daily.   donepezil 10 MG tablet Commonly known as:  ARICEPT Take 10 mg by mouth at bedtime.   DULoxetine 60 MG capsule Commonly known as:  CYMBALTA Take 60 mg by mouth daily.   ENTRESTO 49-51 MG Generic drug:  sacubitril-valsartan Take 1 tablet by mouth 2 (two) times daily.   finasteride 5 MG tablet Commonly known as:  PROSCAR Take 5 mg by mouth daily. *staff : do not handle without gloves*   furosemide 20 MG tablet Commonly known as:  LASIX Take 2 tablets (40 mg) by mouth daily each morning. Take an additional 1 tablet (20 mg) by mouth  6 hours later.   GLUCERNA Liqd Take 1 Can by mouth 2 (two) times daily between meals.   HYDROcodone-acetaminophen 5-325 MG tablet Commonly known as:  NORCO/VICODIN Take 1 tablet by mouth every 6 (six) hours as needed for moderate pain.   metoprolol tartrate 25 MG tablet Commonly known as:  LOPRESSOR Take 1 tablet (25 mg total) by mouth 2 (two) times daily.   NAMENDA XR 28 MG Cp24 24 hr capsule Generic drug:  memantine Take 28 mg by mouth daily.   NOVOLIN R 100 units/mL injection Generic drug:  insulin regular Inject 3-12 Units into the skin daily as needed for high blood sugar. Per sliding Scale If BS is less than 60 call NP/PA. If BS is 175 - 250 give 3 u, If BS is 251 - 325 give 6 units, If BS is 326 - 450, give 10 units, If BS is > 450 give 12 units and call NP/PA.   pantoprazole 40 MG tablet Commonly known as:  PROTONIX Take 40 mg by mouth daily.   pregabalin 75 MG capsule Commonly known as:  LYRICA Take 75 mg by mouth 2 (two) times daily.   PROSTATE SR 160-250 MG Caps Take 1 capsule by mouth daily.   senna 8.6 MG Tabs tablet Commonly  known as:  SENOKOT Take 1 tablet (8.6 mg total) by mouth daily.   STOOL SOFTENER 100 MG capsule Generic drug:  docusate sodium Take 100 mg by mouth daily as needed for mild constipation or moderate constipation.   timolol 0.5 % ophthalmic solution Commonly known as:  TIMOPTIC Place 1 drop into both eyes 2 (two) times daily.   TOUJEO SOLOSTAR 300 UNIT/ML Sopn Generic drug:  Insulin Glargine Inject 67 Units into the skin every morning. 8 am   XARELTO 20 MG Tabs tablet Generic drug:  rivaroxaban Take 1 tablet by mouth daily.       Review of  Systems  Unable to perform ROS: Dementia  Constitutional: Negative for activity change, appetite change, chills, diaphoresis and fever.  HENT: Negative for congestion, sneezing, sore throat, trouble swallowing and voice change.   Eyes: Negative.   Respiratory: Negative for apnea, cough, choking, chest tightness, shortness of breath and wheezing.   Cardiovascular: Negative for chest pain, palpitations and leg swelling.  Gastrointestinal: Negative for abdominal distention, abdominal pain, constipation, diarrhea and nausea.  Genitourinary: Negative for difficulty urinating, dysuria, frequency and urgency.  Musculoskeletal: Negative for back pain, gait problem and myalgias. Arthralgias: typical arthritis.  Skin: Negative for color change, pallor, rash and wound.  Neurological: Positive for weakness. Negative for dizziness, tremors, syncope, speech difficulty, numbness and headaches.  Psychiatric/Behavioral: Negative for agitation and behavioral problems.  All other systems reviewed and are negative.   Immunization History  Administered Date(s) Administered  . Influenza Split 12/04/2014  . Influenza,inj,Quad PF,36+ Mos 11/11/2015  . Influenza-Unspecified 11/17/2011, 12/04/2014  . PPD Test 02/25/2014, 03/11/2014, 04/08/2016  . Pneumococcal Polysaccharide-23 02/11/2008   Pertinent  Health Maintenance Due  Topic Date Due  . FOOT EXAM   07/16/1941  . OPHTHALMOLOGY EXAM  07/16/1941  . URINE MICROALBUMIN  07/16/1941  . PNA vac Low Risk Adult (2 of 2 - PCV13) 02/10/2009  . HEMOGLOBIN A1C  03/08/2015  . INFLUENZA VACCINE  09/10/2016   No flowsheet data found. Functional Status Survey:    Vitals:   09/09/16 1009  BP: (!) 147/64  Pulse: 65  Resp: 18  Temp: 97.7 F (36.5 C)  SpO2: 95%  Weight: 181 lb 4.8 oz (82.2 kg)  Height: 5\' 6"  (1.676 m)   Body mass index is 29.26 kg/m. Physical Exam  Constitutional: Vital signs are normal. He appears well-developed and well-nourished. He is active and cooperative. He does not appear ill. No distress.  HENT:  Head: Normocephalic and atraumatic.  Mouth/Throat: Uvula is midline, oropharynx is clear and moist and mucous membranes are normal. Mucous membranes are not pale, not dry and not cyanotic.  Eyes: Pupils are equal, round, and reactive to light. Conjunctivae, EOM and lids are normal.  Neck: Trachea normal, normal range of motion and full passive range of motion without pain. Neck supple. No JVD present. No tracheal deviation, no edema and no erythema present. No thyromegaly present.  Cardiovascular: Normal rate, regular rhythm, normal heart sounds, intact distal pulses and normal pulses.  Exam reveals no gallop, no distant heart sounds and no friction rub.   No murmur heard. Pulses:      Dorsalis pedis pulses are 2+ on the right side, and 2+ on the left side.  Pulmonary/Chest: Effort normal and breath sounds normal. No accessory muscle usage. No respiratory distress. He has no wheezes. He has no rhonchi. He has no rales. He exhibits no tenderness.  Abdominal: Normal appearance. He exhibits no distension and no ascites. Bowel sounds are decreased. There is no tenderness. There is no CVA tenderness.  Abdomen distended and taut. Non-tender. Sluggish bowel sounds.  Musculoskeletal: Normal range of motion. He exhibits no edema or tenderness.  Expected osteoarthritis, stiffness    Neurological: He is alert. He has normal strength. He is disoriented (to place).  Skin: Skin is warm, dry and intact. No rash noted. He is not diaphoretic. No cyanosis or erythema. No pallor. Nails show no clubbing.  Psychiatric: He has a normal mood and affect. His speech is normal and behavior is normal. Judgment and thought content normal. Cognition and memory are normal.  Nursing note and vitals  reviewed.   Labs reviewed:  Recent Labs  10/17/15 1434  11/13/15 0426  04/06/16 0431  04/08/16 0310 04/24/16 1340 07/18/16 1100  NA 135  < > 139  < > 139  --   --  133* 138  K 4.4  < > 3.2*  < > 3.9  --   --  4.9 4.2  CL 99*  < > 106  < > 107  --   --  98* 102  CO2 26  < > 26  < > 27  --   --  26 28  GLUCOSE 124*  < > 100*  < > 144*  --   --  408* 174*  BUN 30*  < > 23*  < > 23*  --   --  41* 35*  CREATININE 2.17*  < > 1.61*  < > 1.38*  < > 1.43* 1.83* 1.95*  CALCIUM 8.9  < > 9.0  < > 8.3*  --   --  9.1 9.0  MG 2.0  --  1.9  --   --   --   --   --  2.2  < > = values in this interval not displayed.  Recent Labs  04/05/16 1848 04/24/16 1340 07/18/16 1100  AST 27 24 46*  ALT 18 33 80*  ALKPHOS 66 92 83  BILITOT 0.5 0.2* 0.6  PROT 7.9 7.8 7.5  ALBUMIN 3.4* 3.0* 3.2*    Recent Labs  11/09/15 1549  04/08/16 0310 04/24/16 1340 07/18/16 1100  WBC 13.7*  < > 5.2 6.8 4.7  NEUTROABS 11.9*  --   --  4.9 3.0  HGB 11.7*  < > 10.5* 10.3* 10.3*  HCT 34.3*  < > 30.1* 30.2* 30.5*  MCV 90.1  < > 91.7 92.4 93.9  PLT 275  < > 243 322 219  < > = values in this interval not displayed. Lab Results  Component Value Date   TSH 4.410 07/18/2016   Lab Results  Component Value Date   HGBA1C 9.5 (H) 09/05/2014   Lab Results  Component Value Date   CHOL 177 07/18/2016   HDL 34 (L) 07/18/2016   LDLCALC 122 (H) 07/18/2016   TRIG 105 07/18/2016   CHOLHDL 5.2 07/18/2016    Significant Diagnostic Results in last 30 days:  No results found.  Assessment/Plan 1. Late onset Alzheimer's  disease without behavioral disturbance 2. Dementia in other diseases classified elsewhere without behavioral disturbance  Stable  Continue Donepezil 10 mg po Q HS  Continue Namenda 28 mg XR po Q Day  3. Other intervertebral disc degeneration, lumbar region  Stable  Continue Tylenol 650 mg po Q 4 hours prn pain  4. Glaucoma, unspecified glaucoma type, unspecified laterality  Stable   Continue Timolol 0.5%- 1 drop in both eyes BID  5. 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 Type 2 diabetes mellitus with other diabetic ophthalmic complication (HCC) 7. Type 2 diabetes mellitus with chronic kidney disease, with long-term current use of insulin, unspecified CKD stage (HCC) 8. Type 2 diabetes mellitus with diabetic polyneuropathy, with long-term current use of insulin (HCC)  Stable  Toujeo 67 units SQ in the AM  Novolin R- SSI prn  FSBS AC/HS  Continue Lyrica 75 mg po BID  9. 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 Hypertensive heart disease with heart failure (HCC)  Stable  Increase Entresto 49-51 1 tablet po BID  11. Major depressive disorder with single episode, remission status unspecified  Stable  Continue Cholecalciferol 1,000 units po Q Day  Continue Duloxetine 60 mg po Q Day  12. Chronic systolic (congestive) heart failure (HCC)  Stable  Continue Furosemide 40 mg po Q AM  Continue Furosemide 20 mg po Q afternoon  13. Benign prostatic hyperplasia without lower urinary tract symptoms  Stable  Continue Finasteride 5 mg po Q Day  Continue Prostate SR 160-250 po Q Day  14. Pure hypercholesterolemia, unspecified  Stable  Continue Atorvastatin 20 mg po Q Day  15. Gastroesophageal reflux disease without  esophagitis  Stable  Decrease Protonix 20 mg po Q Day  16. Dysphagia, oral phase  Stable  Continue Glucerna 1 can BID  17. Essential hypertension  Stable  Continue Amlodipine 10 mg po Q Day  Continue Metoprolol 25 mg po BID  18. Dermatitis associated with moisture  Boudreaux's Butt Paste- liberal amount to area of skin irritation BID and prn   Family/ staff Communication:   Total Time:  Documentation:  Face to Face:  Family/Phone:   Labs/tests ordered:   Medication list reviewed and assessed for continued appropriateness. Monthly medication orders reviewed and signed.  Brynda RimShannon H. Matheson Vandehei, NP-C Geriatrics [facility] [phone] [place], [place] [address] Cell Phone (Mon-Fri 8am-5pm):  [phone] On Call:  [phone] & follow prompts after 5pm & weekends Office Phone:  [phone] Office Fax:  [phone]

## 2016-09-10 ENCOUNTER — Encounter
Admission: RE | Admit: 2016-09-10 | Discharge: 2016-09-10 | Disposition: A | Discharge status: Home / Self Care | Payer: Medicare Other | Source: Ambulatory Visit | Attending: Internal Medicine | Admitting: Internal Medicine

## 2016-10-09 ENCOUNTER — Encounter: Payer: Self-pay | Admitting: Gerontology

## 2016-10-09 ENCOUNTER — Non-Acute Institutional Stay (SKILLED_NURSING_FACILITY): Payer: Medicare Other | Admitting: Gerontology

## 2016-10-09 DIAGNOSIS — I5022 Chronic systolic (congestive) heart failure: Secondary | ICD-10-CM

## 2016-10-09 DIAGNOSIS — L308 Other specified dermatitis: Secondary | ICD-10-CM

## 2016-10-09 DIAGNOSIS — H409 Unspecified glaucoma: Secondary | ICD-10-CM | POA: Diagnosis not present

## 2016-10-09 DIAGNOSIS — R1311 Dysphagia, oral phase: Secondary | ICD-10-CM

## 2016-10-09 DIAGNOSIS — M5136 Other intervertebral disc degeneration, lumbar region: Secondary | ICD-10-CM | POA: Diagnosis not present

## 2016-10-09 DIAGNOSIS — I110 Hypertensive heart disease with heart failure: Secondary | ICD-10-CM | POA: Diagnosis not present

## 2016-10-09 DIAGNOSIS — G301 Alzheimer's disease with late onset: Secondary | ICD-10-CM

## 2016-10-09 DIAGNOSIS — F329 Major depressive disorder, single episode, unspecified: Secondary | ICD-10-CM

## 2016-10-09 DIAGNOSIS — J189 Pneumonia, unspecified organism: Secondary | ICD-10-CM

## 2016-10-09 DIAGNOSIS — E1139 Type 2 diabetes mellitus with other diabetic ophthalmic complication: Secondary | ICD-10-CM | POA: Diagnosis not present

## 2016-10-09 DIAGNOSIS — E11319 Type 2 diabetes mellitus with unspecified diabetic retinopathy without macular edema: Secondary | ICD-10-CM

## 2016-10-09 DIAGNOSIS — E1122 Type 2 diabetes mellitus with diabetic chronic kidney disease: Secondary | ICD-10-CM | POA: Diagnosis not present

## 2016-10-09 DIAGNOSIS — E1142 Type 2 diabetes mellitus with diabetic polyneuropathy: Secondary | ICD-10-CM | POA: Diagnosis not present

## 2016-10-09 DIAGNOSIS — N400 Enlarged prostate without lower urinary tract symptoms: Secondary | ICD-10-CM

## 2016-10-09 DIAGNOSIS — Z794 Long term (current) use of insulin: Secondary | ICD-10-CM

## 2016-10-09 DIAGNOSIS — I10 Essential (primary) hypertension: Secondary | ICD-10-CM

## 2016-10-09 DIAGNOSIS — F0280 Dementia in other diseases classified elsewhere without behavioral disturbance: Secondary | ICD-10-CM | POA: Diagnosis not present

## 2016-10-09 DIAGNOSIS — I82502 Chronic embolism and thrombosis of unspecified deep veins of left lower extremity: Secondary | ICD-10-CM | POA: Diagnosis not present

## 2016-10-09 DIAGNOSIS — K219 Gastro-esophageal reflux disease without esophagitis: Secondary | ICD-10-CM

## 2016-10-09 DIAGNOSIS — E7800 Pure hypercholesterolemia, unspecified: Secondary | ICD-10-CM

## 2016-10-09 NOTE — Progress Notes (Signed)
Location:   [facility] Room Number: 212A Place of Service:  SNF [phone]) Provider:  Lorenso QuarryShannon Espiridion Supinski, NP-C  [name], [name], MD  Patient Care Team: [name], [name], MD as PCP - General (Family Medicine)  Extended Emergency Contact Information Primary Emergency Contact: [name] AreaStephens,[name] A Address: [phone] [facility] [address] [address]          [place], [place] [id number] [place] Home Phone: [phone] Relation: Spouse  Code Status:  FULL Goals of care: Advanced Directive information Advanced Directives 10/09/2016  Does Patient Have a Medical Advance Directive? No  Type of Advance Directive -  Does patient want to make changes to medical advance directive? -  Copy of Healthcare Power of Attorney in Chart? -  Would patient like information on creating a medical advance directive? -     Chief Complaint  Patient presents with  . Medical Management of Chronic Issues    Routine Visit    HPI:  Pt is a 83 y.o. male seen today for medical management of chronic diseases. Pt has been in the facility for long term care since February. Since them, his FSBS have been better controlled with Toujeo Insulin. Pt has CHF with chronic BLE pitting edema. L>R. + pulses in BLE. H/O DVTs. Negative Homan's sign in B-calves. Pt does have dementia. Pt has remained safe without falls. No c/o pain. Depression well managed. No c/o n/v/d/f/c/cp/sob/ha/abd pain/dizziness. Pt reports cough, congestion.Staff noticed decreased O2 sats. CXR obtained. PNA. Otherwise, Pt reports appetite is good and denies difficulty swallowing. Having regular BMs. VSS. No other complaints.     Past Medical History:  Diagnosis Date  . Arthritis   . Background retinopathy   . Cervicalgia   . Chronic kidney disease    follow up with a Dr.   . Chronic systolic CHF (congestive heart failure) (HCC)   . Congenital heart failure (HCC)   . Degeneration of lumbar or lumbosacral intervertebral disc   . Dementia   .  Diabetes mellitus type 2, uncomplicated (HCC) 1996   on insulin since 2011, last eye exam with Dr. Inez PilgrimBrasington 07/2013  . Diabetes mellitus without complication (HCC)   . DVT (deep venous thrombosis) (HCC)   . Essential hypertension    unspecified  . Generalized weakness   . Glaucoma   . Hypercholesteremia   . Hypertension   . Long-term insulin use (HCC)   . Moderate dementia   . Peripheral polyneuropathy   . Peripheral vascular disease (HCC)   . Pneumonia, unspecified organism 04/01/2014  . Pure hypercholesterolemia   . Spinal stenosis   . Spinal stenosis, lumbar region without neurogenic claudication    Past Surgical History:  Procedure Laterality Date  . ANGIOPLASTY / STENTING FEMORAL  1999  . ANTERIOR FUSION CERVICAL SPINE  07/2009  . c-spine fusion N/A   . COLONOSCOPY  05/23/2004   Dr. Maryruth BunKapur, normal, PH colon polpys    Allergies  Allergen Reactions  . Fosinopril Other (See Comments)    Other reaction(s): Other (See Comments) Hyperkalemia Reaction: Hyperkalemia     Allergies as of 10/09/2016      Reactions   Fosinopril Other (See Comments)   Other reaction(s): Other (See Comments) Hyperkalemia Reaction: Hyperkalemia       Medication List       Accurate as of 10/09/16  3:08 PM. Always use your most recent med list.          acetaminophen 325 MG tablet Commonly known as:  TYLENOL Take 650 mg  by mouth every 6 (six) hours as needed for mild pain, moderate pain, fever or headache.   albuterol 108 (90 Base) MCG/ACT inhaler Commonly known as:  PROVENTIL HFA;VENTOLIN HFA Inhale 2 puffs into the lungs every 4 (four) hours as needed for wheezing or shortness of breath.   amLODipine 10 MG tablet Commonly known as:  NORVASC Take 1 tablet (10 mg total) by mouth daily.   benzonatate 100 MG capsule Commonly known as:  TESSALON Take 200 mg by mouth every 8 (eight) hours as needed for cough.   bisacodyl 5 MG EC tablet Commonly known as:  DULCOLAX Take 1 tablet (5  mg total) by mouth daily as needed for moderate constipation.   BOUDREAUXS BUTT PASTE 16 % Oint Generic drug:  Zinc Oxide Apply liberal amount topically to area of skin breakdown  2 times daily and as needed. Ok to leave at bedside   cholecalciferol 1000 units tablet Commonly known as:  VITAMIN D Take 1,000 Units by mouth daily.   donepezil 10 MG tablet Commonly known as:  ARICEPT Take 10 mg by mouth at bedtime.   DULoxetine 60 MG capsule Commonly known as:  CYMBALTA Take 60 mg by mouth daily.   ENTRESTO 49-51 MG Generic drug:  sacubitril-valsartan Take 1 tablet by mouth 2 (two) times daily.   finasteride 5 MG tablet Commonly known as:  PROSCAR Take 5 mg by mouth daily. *staff : do not handle without gloves*   furosemide 20 MG tablet Commonly known as:  LASIX Take 2 tablets (40 mg) by mouth daily each morning. Take an additional 1 tablet (20 mg) by mouth  6 hours later.   GLUCERNA Liqd Take 1 Can by mouth daily.   HYDROcodone-acetaminophen 5-325 MG tablet Commonly known as:  NORCO/VICODIN Take 1 tablet by mouth every 6 (six) hours as needed for moderate pain.   metoprolol tartrate 25 MG tablet Commonly known as:  LOPRESSOR Take 1 tablet (25 mg total) by mouth 2 (two) times daily.   NAMENDA XR 28 MG Cp24 24 hr capsule Generic drug:  memantine Take 28 mg by mouth daily.   NOVOLIN R 100 units/mL injection Generic drug:  insulin regular Inject 3-12 Units into the skin daily as needed for high blood sugar. Per sliding Scale If BS is less than 60 call NP/PA. If BS is 175 - 250 give 3 u, If BS is 251 - 325 give 6 units, If BS is 326 - 450, give 10 units, If BS is > 450 give 12 units and call NP/PA.   pantoprazole 20 MG tablet Commonly known as:  PROTONIX Take 20 mg by mouth daily.   pregabalin 75 MG capsule Commonly known as:  LYRICA Take 75 mg by mouth 2 (two) times daily.   senna 8.6 MG Tabs tablet Commonly known as:  SENOKOT Take 1 tablet (8.6 mg total) by mouth  daily.   STOOL SOFTENER 100 MG capsule Generic drug:  docusate sodium Take 100 mg by mouth daily as needed for mild constipation or moderate constipation.   tamsulosin 0.4 MG Caps capsule Commonly known as:  FLOMAX Take 0.4 mg by mouth at bedtime.   timolol 0.5 % ophthalmic solution Commonly known as:  TIMOPTIC Place 1 drop into both eyes 2 (two) times daily.   TOUJEO SOLOSTAR 300 UNIT/ML Sopn Generic drug:  Insulin Glargine Inject 67 Units into the skin every morning. 8 am   XARELTO 20 MG Tabs tablet Generic drug:  rivaroxaban Take 1 tablet by mouth  daily.       Review of Systems  Unable to perform ROS: Dementia  Constitutional: Negative for activity change, appetite change, chills, diaphoresis and fever.  HENT: Negative for congestion, sneezing, sore throat, trouble swallowing and voice change.   Eyes: Negative.   Respiratory: Positive for cough. Negative for apnea, choking, chest tightness, shortness of breath and wheezing.   Cardiovascular: Negative for chest pain, palpitations and leg swelling.  Gastrointestinal: Negative for abdominal distention, abdominal pain, constipation, diarrhea and nausea.  Genitourinary: Negative for difficulty urinating, dysuria, frequency and urgency.  Musculoskeletal: Negative for back pain, gait problem and myalgias. Arthralgias: typical arthritis.  Skin: Negative for color change, pallor, rash and wound.  Neurological: Positive for weakness. Negative for dizziness, tremors, syncope, speech difficulty, numbness and headaches.  Psychiatric/Behavioral: Negative for agitation and behavioral problems.  All other systems reviewed and are negative.   Immunization History  Administered Date(s) Administered  . Influenza Split 12/04/2014  . Influenza,inj,Quad PF,6+ Mos 11/11/2015  . Influenza-Unspecified 11/17/2011, 12/04/2014  . PPD Test 02/25/2014, 03/11/2014, 04/08/2016  . Pneumococcal Polysaccharide-23 02/11/2008   Pertinent  Health  Maintenance Due  Topic Date Due  . FOOT EXAM  07/16/1941  . OPHTHALMOLOGY EXAM  07/16/1941  . URINE MICROALBUMIN  07/16/1941  . PNA vac Low Risk Adult (2 of 2 - PCV13) 02/10/2009  . HEMOGLOBIN A1C  03/08/2015  . INFLUENZA VACCINE  09/10/2016   No flowsheet data found. Functional Status Survey:    Vitals:   10/09/16 1456  BP: 113/68  Pulse: 75  Resp: 19  Temp: 98.7 F (37.1 C)  SpO2: 93%  Weight: 185 lb 14.4 oz (84.3 kg)  Height: 5\' 6"  (1.676 m)   Body mass index is 30.01 kg/m. Physical Exam  Constitutional: Vital signs are normal. He appears well-developed and well-nourished. He is active and cooperative. He does not appear ill. No distress.  HENT:  Head: Normocephalic and atraumatic.  Mouth/Throat: Uvula is midline, oropharynx is clear and moist and mucous membranes are normal. Mucous membranes are not pale, not dry and not cyanotic.  Eyes: Pupils are equal, round, and reactive to light. Conjunctivae, EOM and lids are normal.  Neck: Trachea normal, normal range of motion and full passive range of motion without pain. Neck supple. No JVD present. No tracheal deviation, no edema and no erythema present. No thyromegaly present.  Cardiovascular: Normal rate, regular rhythm, normal heart sounds, intact distal pulses and normal pulses.  Exam reveals no gallop, no distant heart sounds and no friction rub.   No murmur heard. Pulses:      Dorsalis pedis pulses are 2+ on the right side, and 2+ on the left side.  Pulmonary/Chest: Effort normal. No accessory muscle usage. No respiratory distress. He has no decreased breath sounds. He has wheezes in the right lower field and the left lower field. He has rhonchi in the right lower field and the left lower field. He has no rales. He exhibits no tenderness.  Abdominal: Normal appearance. He exhibits no distension and no ascites. Bowel sounds are decreased. There is no tenderness. There is no CVA tenderness.  Abdomen distended and taut.  Non-tender. Sluggish bowel sounds.  Musculoskeletal: Normal range of motion. He exhibits no edema or tenderness.  Expected osteoarthritis, stiffness  Neurological: He is alert. He has normal strength. He is disoriented (to place).  Skin: Skin is warm, dry and intact. No rash noted. He is not diaphoretic. No cyanosis or erythema. No pallor. Nails show no clubbing.  Psychiatric: He  has a normal mood and affect. His speech is normal and behavior is normal. Judgment and thought content normal. Cognition and memory are normal.  Nursing note and vitals reviewed.   Labs reviewed:  Recent Labs  10/17/15 1434  11/13/15 0426  04/06/16 0431  04/08/16 0310 04/24/16 1340 07/18/16 1100  NA 135  < > 139  < > 139  --   --  133* 138  K 4.4  < > 3.2*  < > 3.9  --   --  4.9 4.2  CL 99*  < > 106  < > 107  --   --  98* 102  CO2 26  < > 26  < > 27  --   --  26 28  GLUCOSE 124*  < > 100*  < > 144*  --   --  408* 174*  BUN 30*  < > 23*  < > 23*  --   --  41* 35*  CREATININE 2.17*  < > 1.61*  < > 1.38*  < > 1.43* 1.83* 1.95*  CALCIUM 8.9  < > 9.0  < > 8.3*  --   --  9.1 9.0  MG 2.0  --  1.9  --   --   --   --   --  2.2  < > = values in this interval not displayed.  Recent Labs  04/05/16 1848 04/24/16 1340 07/18/16 1100  AST 27 24 46*  ALT 18 33 80*  ALKPHOS 66 92 83  BILITOT 0.5 0.2* 0.6  PROT 7.9 7.8 7.5  ALBUMIN 3.4* 3.0* 3.2*    Recent Labs  11/09/15 1549  04/08/16 0310 04/24/16 1340 07/18/16 1100  WBC 13.7*  < > 5.2 6.8 4.7  NEUTROABS 11.9*  --   --  4.9 3.0  HGB 11.7*  < > 10.5* 10.3* 10.3*  HCT 34.3*  < > 30.1* 30.2* 30.5*  MCV 90.1  < > 91.7 92.4 93.9  PLT 275  < > 243 322 219  < > = values in this interval not displayed. Lab Results  Component Value Date   TSH 4.410 07/18/2016   Lab Results  Component Value Date   HGBA1C 9.5 (H) 09/05/2014   Lab Results  Component Value Date   CHOL 177 07/18/2016   HDL 34 (L) 07/18/2016   LDLCALC 122 (H) 07/18/2016   TRIG 105  07/18/2016   CHOLHDL 5.2 07/18/2016    Significant Diagnostic Results in last 30 days:  No results found.  Assessment/Plan 1. Late onset Alzheimer's disease without behavioral disturbance 2. Dementia in other diseases classified elsewhere without behavioral disturbance  Stable  Continue Donepezil 10 mg po Q HS  Continue Namenda 28 mg XR po Q Day  3. Other intervertebral disc degeneration, lumbar region  Stable  Continue Tylenol 650 mg po Q 4 hours prn pain  4. Glaucoma, unspecified glaucoma type, unspecified laterality  Stable   Continue Timolol 0.5%- 1 drop in both eyes BID  5. 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 Type 2 diabetes mellitus with other diabetic ophthalmic complication (HCC) 7. Type 2 diabetes mellitus with chronic kidney disease, with long-term current use of insulin, unspecified CKD stage (HCC) 8. Type 2 diabetes mellitus with diabetic polyneuropathy, with long-term current use of insulin (HCC)  Stable  Toujeo 67 units SQ in the AM  Novolin R- SSI prn  FSBS AC/HS  Continue Lyrica 75 mg po BID  9. 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 Hypertensive heart disease with heart failure (HCC)  Stable  Increase Entresto 49-51 1 tablet po BID  11. Major depressive disorder with single episode, remission status unspecified  Stable  Continue Cholecalciferol 1,000 units po Q Day  Continue Duloxetine 60 mg po Q Day  12. Chronic systolic (congestive) heart failure (HCC)  Stable  Continue Furosemide 40 mg po Q AM  Continue Furosemide 20 mg po Q afternoon  13. Benign prostatic hyperplasia without lower urinary tract symptoms  Stable  Continue Finasteride 5 mg po Q Day  Continue Prostate SR 160-250 po Q  Day  Continue Tamslosin 0.4 mg po Q HS  14. Pure hypercholesterolemia, unspecified  Stable  Continue Atorvastatin 20 mg po Q Day  15. Gastroesophageal reflux disease without esophagitis  Stable  Decrease Protonix 20 mg po Q Day  16. Dysphagia, oral phase  Stable  Continue Glucerna 1 can BID  17. Essential hypertension  Stable  Continue Amlodipine 10 mg po Q Day  Continue Metoprolol 25 mg po BID  18. Dermatitis associated with moisture  Boudreaux's Butt Paste- liberal amount to area of skin irritation BID and prn  19. Pneumonia due to infectious organism, unspecified laterality, unspecified part of lung  Solumedrol 125 mg IM x 1  Augmentin 875-125 mg po Q 12 hours x 7 days  Duonebs TID x 3 days  Family/ staff Communication:   Total Time:  Documentation:  Face to Face:  Family/Phone:   Labs/tests ordered: 2 view CXR  Medication list reviewed and assessed for continued appropriateness. Monthly medication orders reviewed and signed.  Brynda Rim, NP-C [facility]iatric[facility] [phone] [address] [place] [place], [place] [address] Cell Phone (Mon-Fri 8am-5pm):  [phone] On Call:  [phone] & follow prompts after 5pm & weekends Office Phone:  [phone] Office Fax:  [phone]

## 2016-10-11 ENCOUNTER — Encounter
Admission: RE | Admit: 2016-10-11 | Discharge: 2016-10-11 | Disposition: A | Discharge status: Home / Self Care | Payer: Medicare Other | Source: Ambulatory Visit | Attending: Internal Medicine | Admitting: Internal Medicine

## 2016-10-20 ENCOUNTER — Other Ambulatory Visit: Payer: Self-pay

## 2016-10-20 MED ORDER — PREGABALIN 75 MG PO CAPS: 75.0000 mg | ORAL_CAPSULE | Freq: Two times a day (BID) | ORAL | 4 refills | Status: DC

## 2016-10-20 NOTE — Telephone Encounter (Signed)
Rx sent to [facility] phone : [phone] , fax : [phone]  

## 2016-10-29 ENCOUNTER — Encounter: Payer: Self-pay | Admitting: Emergency Medicine

## 2016-10-29 ENCOUNTER — Emergency Department
Admission: EM | Admit: 2016-10-29 | Discharge: 2016-10-30 | Disposition: A | Discharge status: Home / Self Care | Payer: Medicare Other | Attending: Emergency Medicine | Admitting: Emergency Medicine

## 2016-10-29 ENCOUNTER — Emergency Department: Payer: Medicare Other

## 2016-10-29 ENCOUNTER — Non-Acute Institutional Stay (SKILLED_NURSING_FACILITY): Payer: Medicare Other | Admitting: Gerontology

## 2016-10-29 ENCOUNTER — Encounter: Payer: Self-pay | Admitting: Gerontology

## 2016-10-29 DIAGNOSIS — R739 Hyperglycemia, unspecified: Secondary | ICD-10-CM

## 2016-10-29 DIAGNOSIS — G309 Alzheimer's disease, unspecified: Secondary | ICD-10-CM | POA: Insufficient documentation

## 2016-10-29 DIAGNOSIS — I130 Hypertensive heart and chronic kidney disease with heart failure and stage 1 through stage 4 chronic kidney disease, or unspecified chronic kidney disease: Secondary | ICD-10-CM | POA: Insufficient documentation

## 2016-10-29 DIAGNOSIS — E1122 Type 2 diabetes mellitus with diabetic chronic kidney disease: Secondary | ICD-10-CM | POA: Diagnosis not present

## 2016-10-29 DIAGNOSIS — I5022 Chronic systolic (congestive) heart failure: Secondary | ICD-10-CM | POA: Diagnosis not present

## 2016-10-29 DIAGNOSIS — R401 Stupor: Secondary | ICD-10-CM | POA: Diagnosis not present

## 2016-10-29 DIAGNOSIS — Z87891 Personal history of nicotine dependence: Secondary | ICD-10-CM | POA: Insufficient documentation

## 2016-10-29 DIAGNOSIS — E860 Dehydration: Secondary | ICD-10-CM | POA: Insufficient documentation

## 2016-10-29 DIAGNOSIS — N183 Chronic kidney disease, stage 3 (moderate): Secondary | ICD-10-CM | POA: Diagnosis not present

## 2016-10-29 DIAGNOSIS — E1165 Type 2 diabetes mellitus with hyperglycemia: Secondary | ICD-10-CM | POA: Insufficient documentation

## 2016-10-29 LAB — COMPREHENSIVE METABOLIC PANEL
ALT: 15 U/L — ABNORMAL LOW (ref 17–63)
ANION GAP: 7 (ref 5–15)
AST: 23 U/L (ref 15–41)
Albumin: 3 g/dL — ABNORMAL LOW (ref 3.5–5.0)
Alkaline Phosphatase: 61 U/L (ref 38–126)
BUN: 40 mg/dL — AB (ref 6–20)
CHLORIDE: 107 mmol/L (ref 101–111)
CO2: 25 mmol/L (ref 22–32)
Calcium: 8.9 mg/dL (ref 8.9–10.3)
Creatinine, Ser: 2.29 mg/dL — ABNORMAL HIGH (ref 0.61–1.24)
GFR calc Af Amer: 29 mL/min — ABNORMAL LOW (ref >60)
GFR, EST NON AFRICAN AMERICAN: 25 mL/min — AB (ref >60)
Glucose, Bld: 347 mg/dL — ABNORMAL HIGH (ref 65–99)
POTASSIUM: 4.9 mmol/L (ref 3.5–5.1)
Sodium: 139 mmol/L (ref 135–145)
Total Bilirubin: 0.7 mg/dL (ref 0.3–1.2)
Total Protein: 7 g/dL (ref 6.5–8.1)

## 2016-10-29 LAB — CBC WITH DIFFERENTIAL/PLATELET
BASOS ABS: 0 10*3/uL (ref 0–0.1)
BASOS PCT: 1 %
EOS PCT: 7 %
Eosinophils Absolute: 0.4 10*3/uL (ref 0–0.7)
HCT: 28.2 % — ABNORMAL LOW (ref 40.0–52.0)
Hemoglobin: 9.6 g/dL — ABNORMAL LOW (ref 13.0–18.0)
LYMPHS PCT: 18 %
Lymphs Abs: 1 10*3/uL (ref 1.0–3.6)
MCH: 31.6 pg (ref 26.0–34.0)
MCHC: 34 g/dL (ref 32.0–36.0)
MCV: 93.1 fL (ref 80.0–100.0)
MONO ABS: 0.5 10*3/uL (ref 0.2–1.0)
MONOS PCT: 8 %
NEUTROS ABS: 3.9 10*3/uL (ref 1.4–6.5)
NEUTROS PCT: 66 %
PLATELETS: 215 10*3/uL (ref 150–440)
RBC: 3.03 MIL/uL — ABNORMAL LOW (ref 4.40–5.90)
RDW: 15.9 % — AB (ref 11.5–14.5)
WBC: 5.8 10*3/uL (ref 3.8–10.6)

## 2016-10-29 LAB — GLUCOSE, CAPILLARY
GLUCOSE-CAPILLARY: 345 mg/dL — AB (ref 65–99)
GLUCOSE-CAPILLARY: 353 mg/dL — AB (ref 65–99)
GLUCOSE-CAPILLARY: 434 mg/dL — AB (ref 65–99)
Glucose-Capillary: 388 mg/dL — ABNORMAL HIGH (ref 65–99)
Glucose-Capillary: 356 mg/dL — ABNORMAL HIGH (ref 65–99)

## 2016-10-29 LAB — TROPONIN I

## 2016-10-29 MED ORDER — SODIUM CHLORIDE 0.9 % IV SOLN
1.0000 g | Freq: Once | INTRAVENOUS | Status: DC
  Filled 2016-10-29: qty 10

## 2016-10-29 MED ORDER — AMIODARONE HCL IN DEXTROSE 360-4.14 MG/200ML-% IV SOLN: 60.0000 mg/h | Freq: Once | INTRAVENOUS | Status: DC

## 2016-10-29 MED ORDER — ONDANSETRON HCL 4 MG/2ML IJ SOLN
4.0000 mg | Freq: Once | INTRAMUSCULAR | Status: AC
  Administered 2016-10-29: 4 mg via INTRAVENOUS
  Filled 2016-10-29: qty 2

## 2016-10-29 MED ORDER — SODIUM CHLORIDE 0.9 % IV BOLUS (SEPSIS)
500.0000 mL | Freq: Once | INTRAVENOUS | Status: AC
  Administered 2016-10-29: 500 mL via INTRAVENOUS

## 2016-10-29 MED ORDER — DEXTROSE 5 % IV SOLN: 60.0000 mg/h | Freq: Once | INTRAVENOUS | Status: DC

## 2016-10-29 MED ORDER — AMIODARONE IV BOLUS ONLY 150 MG/100ML: 150.0000 mg | Freq: Once | INTRAVENOUS | Status: DC

## 2016-10-29 MED ORDER — INSULIN ASPART 100 UNIT/ML ~~LOC~~ SOLN
10.0000 [IU] | Freq: Once | SUBCUTANEOUS | Status: AC
  Administered 2016-10-29: 10 [IU] via INTRAVENOUS
  Filled 2016-10-29: qty 1

## 2016-10-29 MED ORDER — INSULIN ASPART 100 UNIT/ML ~~LOC~~ SOLN
6.0000 [IU] | Freq: Once | SUBCUTANEOUS | Status: AC
  Administered 2016-10-29: 6 [IU] via SUBCUTANEOUS
  Filled 2016-10-29: qty 1

## 2016-10-29 NOTE — ED Notes (Signed)
Pt wife at bedside. Pt wife reports pt had an episode of vomiting while eating lunch at the facility prior to arrival.

## 2016-10-29 NOTE — ED Notes (Signed)
CBG 356 

## 2016-10-29 NOTE — Progress Notes (Signed)
Location:   [facility] Room Number: [phone] Place of Service:  SNF [phone]) Provider:  Lorenso Quarry, NP-C  Marisue Ivan, MD  Patient Care Team: Marisue Ivan, MD as PCP - General (Family Medicine)  Extended Emergency Contact Information Primary Emergency Contact: [name] Address: [id number]          [place], [place] [address] [place] Home Phone: [phone] Relation: Spouse  Code Status:  FULL Goals of care: Advanced Directive information Advanced Directives 10/29/2016  Does Patient Have a Medical Advance Directive? No  Type of Advance Directive -  Does patient want to make changes to medical advance directive? -  Copy of Healthcare Power of Attorney in Chart? -  Would patient like information on creating a medical advance directive? -     Chief Complaint  Patient presents with  . Acute Visit    Follow up for unresponsiveness    HPI:  Pt is a 83 y.o. male seen today for an acute visit for altered mental status. Called urgently to evaluate pt. Pt was eating lunch without issue in the common area. Pt suddenly started to vomit several times. He became lethargic/ stuporous, slurring words. Unable to follow simple commands. Minimally responsive. Skin was cool, clammy. Pt became pale. Respirations were tachypneic. BGs trending higher than usual in past few days. Wife was present and agreeable to transport to ED for evaluation.     Past Medical History:  Diagnosis Date  . Arthritis   . Background retinopathy   . Cervicalgia   . Chronic kidney disease    follow up with a Dr.   . Chronic systolic CHF (congestive heart failure) (HCC)   . Congenital heart failure (HCC)   . Degeneration of lumbar or lumbosacral intervertebral disc   . Dementia   . Diabetes mellitus type 2, uncomplicated (HCC) 1996   on insulin since 2011, last eye exam with Dr. Inez Pilgrim 07/2013  . Diabetes mellitus without complication (HCC)   .  DVT (deep venous thrombosis) (HCC)   . Essential hypertension    unspecified  . Generalized weakness   . Glaucoma   . Hypercholesteremia   . Hypertension   . Long-term insulin use (HCC)   . Moderate dementia   . Peripheral polyneuropathy   . Peripheral vascular disease (HCC)   . Pneumonia, unspecified organism 04/01/2014  . Pure hypercholesterolemia   . Spinal stenosis   . Spinal stenosis, lumbar region without neurogenic claudication    Past Surgical History:  Procedure Laterality Date  . ANGIOPLASTY / STENTING FEMORAL  1999  . ANTERIOR FUSION CERVICAL SPINE  07/2009  . c-spine fusion N/A   . COLONOSCOPY  05/23/2004   Dr. Maryruth Bun, normal, PH colon polpys    Allergies  Allergen Reactions  . Fosinopril Other (See Comments)    Other reaction(s): Other (See Comments) Hyperkalemia Reaction: Hyperkalemia     Allergies as of 10/29/2016      Reactions   Fosinopril Other (See Comments)   Other reaction(s): Other (See Comments) Hyperkalemia Reaction: Hyperkalemia       Medication List       Accurate as of 10/29/16  2:05 PM. Always use your most recent med list.          acetaminophen 325 MG tablet Commonly known as:  TYLENOL Take 650 mg by mouth every 6 (six) hours as needed for mild pain, moderate pain, fever or headache.   albuterol 108 (90 Base) MCG/ACT inhaler Commonly  known as:  PROVENTIL HFA;VENTOLIN HFA Inhale 2 puffs into the lungs every 4 (four) hours as needed for wheezing or shortness of breath.   amLODipine 10 MG tablet Commonly known as:  NORVASC Take 1 tablet (10 mg total) by mouth daily.   benzonatate 100 MG capsule Commonly known as:  TESSALON Take 200 mg by mouth every 8 (eight) hours as needed for cough.   bisacodyl 5 MG EC tablet Commonly known as:  DULCOLAX Take 1 tablet (5 mg total) by mouth daily as needed for moderate constipation.   BOUDREAUXS BUTT PASTE 16 % Oint Generic drug:  Zinc Oxide Apply liberal amount topically to area of skin  breakdown  2 times daily and as needed. Ok to leave at bedside   cholecalciferol 1000 units tablet Commonly known as:  VITAMIN D Take 1,000 Units by mouth daily.   donepezil 10 MG tablet Commonly known as:  ARICEPT Take 10 mg by mouth at bedtime.   DULoxetine 60 MG capsule Commonly known as:  CYMBALTA Take 60 mg by mouth daily.   ENTRESTO 49-51 MG Generic drug:  sacubitril-valsartan Take 1 tablet by mouth 2 (two) times daily.   finasteride 5 MG tablet Commonly known as:  PROSCAR Take 5 mg by mouth daily. *staff : do not handle without gloves*   furosemide 20 MG tablet Commonly known as:  LASIX Take 2 tablets (40 mg) by mouth daily each morning. Take an additional 1 tablet (20 mg) by mouth  6 hours later.   GLUCERNA Liqd Take 1 Can by mouth daily.   HYDROcodone-acetaminophen 5-325 MG tablet Commonly known as:  NORCO/VICODIN Take 1 tablet by mouth every 6 (six) hours as needed for moderate pain.   metoprolol tartrate 25 MG tablet Commonly known as:  LOPRESSOR Take 1 tablet (25 mg total) by mouth 2 (two) times daily.   NAMENDA XR 28 MG Cp24 24 hr capsule Generic drug:  memantine Take 28 mg by mouth daily.   pantoprazole 20 MG tablet Commonly known as:  PROTONIX Take 20 mg by mouth daily.   pregabalin 75 MG capsule Commonly known as:  LYRICA Take 1 capsule (75 mg total) by mouth 2 (two) times daily.   senna 8.6 MG Tabs tablet Commonly known as:  SENOKOT Take 1 tablet (8.6 mg total) by mouth daily.   STOOL SOFTENER 100 MG capsule Generic drug:  docusate sodium Take 100 mg by mouth daily as needed for mild constipation or moderate constipation.   tamsulosin 0.4 MG Caps capsule Commonly known as:  FLOMAX Take 0.4 mg by mouth at bedtime.   timolol 0.5 % ophthalmic solution Commonly known as:  TIMOPTIC Place 1 drop into both eyes 2 (two) times daily.   TOUJEO SOLOSTAR 300 UNIT/ML Sopn Generic drug:  Insulin Glargine Inject 60 Units into the skin every  morning. 8 am   XARELTO 20 MG Tabs tablet Generic drug:  rivaroxaban Take 1 tablet by mouth daily.       Review of Systems  Unable to perform ROS: Dementia  Constitutional: Negative for activity change, appetite change, chills, diaphoresis and fever.  HENT: Negative for congestion, sneezing, sore throat, trouble swallowing and voice change.   Eyes: Negative.   Respiratory: Positive for cough. Negative for apnea, choking, chest tightness, shortness of breath and wheezing.   Cardiovascular: Negative for chest pain, palpitations and leg swelling.  Gastrointestinal: Negative for abdominal distention, abdominal pain, constipation, diarrhea and nausea.  Genitourinary: Negative for difficulty urinating, dysuria, frequency and urgency.  Musculoskeletal: Negative for back pain, gait problem and myalgias. Arthralgias: typical arthritis.  Skin: Negative for color change, pallor, rash and wound.  Neurological: Positive for syncope and weakness. Negative for dizziness, tremors, facial asymmetry, speech difficulty, numbness and headaches.  Psychiatric/Behavioral: Negative for agitation and behavioral problems.  All other systems reviewed and are negative.   Immunization History  Administered Date(s) Administered  . Influenza Split 12/04/2014  . Influenza,inj,Quad PF,6+ Mos 11/11/2015  . Influenza-Unspecified 11/17/2011, 12/04/2014  . PPD Test 02/25/2014, 03/11/2014, 04/08/2016  . Pneumococcal Polysaccharide-23 02/11/2008   Pertinent  Health Maintenance Due  Topic Date Due  . FOOT EXAM  07/16/1941  . OPHTHALMOLOGY EXAM  07/16/1941  . URINE MICROALBUMIN  07/16/1941  . PNA vac Low Risk Adult (2 of 2 - PCV13) 02/10/2009  . HEMOGLOBIN A1C  03/08/2015  . INFLUENZA VACCINE  09/10/2016   No flowsheet data found. Functional Status Survey:    Vitals:   10/29/16 1355  BP: (!) 120/54  Pulse: 72  Resp: 16  Temp: 98.1 F (36.7 C)  SpO2: 96%  Weight: 183 lb 4.8 oz (83.1 kg)  Height:   (1.676 m)   Body mass index is 29.59 kg/m. Physical Exam  Constitutional: He appears well-developed and well-nourished. He appears listless. He is active and cooperative. He appears ill. No distress.  HENT:  Head: Normocephalic and atraumatic.  Mouth/Throat: Uvula is midline, oropharynx is clear and moist and mucous membranes are normal. Mucous membranes are not pale, not dry and not cyanotic.  Eyes: Pupils are equal, round, and reactive to light. Conjunctivae, EOM and lids are normal.  Neck: Trachea normal, normal range of motion and full passive range of motion without pain. Neck supple. No JVD present. No tracheal deviation, no edema and no erythema present. No thyromegaly present.  Cardiovascular: Normal rate, regular rhythm, normal heart sounds, intact distal pulses and normal pulses.  Exam reveals no gallop, no distant heart sounds and no friction rub.   No murmur heard. Pulses:      Dorsalis pedis pulses are 2+ on the right side, and 2+ on the left side.  Pulmonary/Chest: Effort normal. No accessory muscle usage. Tachypnea noted. No respiratory distress. He has decreased breath sounds in the right lower field and the left lower field. He has no wheezes. He has no rhonchi. He has no rales. He exhibits no tenderness.  Abdominal: Normal appearance. He exhibits no distension and no ascites. Bowel sounds are decreased. There is no tenderness. There is no CVA tenderness.  Abdomen distended and taut. Non-tender. Sluggish bowel sounds.  Musculoskeletal: Normal range of motion. He exhibits no edema or tenderness.  Expected osteoarthritis, stiffness  Neurological: He has normal strength. He appears listless. He is disoriented (to place). He exhibits abnormal muscle tone.  Skin: Skin is warm, dry and intact. No rash noted. He is not diaphoretic. No cyanosis or erythema. No pallor. Nails show no clubbing.  Psychiatric: He has a normal mood and affect. His speech is normal and behavior is normal.  Judgment and thought content normal. Cognition and memory are normal.  Nursing note and vitals reviewed.   Labs reviewed:  Recent Labs  11/13/15 0426  04/06/16 0431  04/08/16 0310 04/24/16 1340 07/18/16 1100  NA 139  < > 139  --   --  133* 138  K 3.2*  < > 3.9  --   --  4.9 4.2  CL 106  < > 107  --   --  98* 102  CO2  26  < > 27  --   --  26 28  GLUCOSE 100*  < > 144*  --   --  408* 174*  BUN 23*  < > 23*  --   --  41* 35*  CREATININE 1.61*  < > 1.38*  < > 1.43* 1.83* 1.95*  CALCIUM 9.0  < > 8.3*  --   --  9.1 9.0  MG 1.9  --   --   --   --   --  2.2  < > = values in this interval not displayed.  Recent Labs  04/05/16 1848 04/24/16 1340 07/18/16 1100  AST 27 24 46*  ALT 18 33 80*  ALKPHOS 66 92 83  BILITOT 0.5 0.2* 0.6  PROT 7.9 7.8 7.5  ALBUMIN 3.4* 3.0* 3.2*    Recent Labs  11/09/15 1549  04/08/16 0310 04/24/16 1340 07/18/16 1100  WBC 13.7*  < > 5.2 6.8 4.7  NEUTROABS 11.9*  --   --  4.9 3.0  HGB 11.7*  < > 10.5* 10.3* 10.3*  HCT 34.3*  < > 30.1* 30.2* 30.5*  MCV 90.1  < > 91.7 92.4 93.9  PLT 275  < > 243 322 219  < > = values in this interval not displayed. Lab Results  Component Value Date   TSH 4.410 07/18/2016   Lab Results  Component Value Date   HGBA1C 9.5 (H) 09/05/2014   Lab Results  Component Value Date   CHOL 177 07/18/2016   HDL 34 (L) 07/18/2016   LDLCALC 122 (H) 07/18/2016   TRIG 105 07/18/2016   CHOLHDL 5.2 07/18/2016    Significant Diagnostic Results in last 30 days:  No results found.  Assessment/Plan 1. Stupor  Send to ED for evaluation  Family/ staff Communication:   Total Time:  Documentation:  Face to Face:  Family/Phone:   Labs/tests ordered:    Medication list reviewed and assessed for continued appropriateness.  Brynda Rim, NP-C Geriatrics [facility] [phone] [address], [place] [address] Cell Phone (Mon-Fri 8am-5pm):  [phone] On Call:  [phone] &  follow prompts after 5pm & weekends Office Phone:  [phone] Office Fax:  [phone]

## 2016-10-29 NOTE — ED Triage Notes (Signed)
Pt arrived via EMS from [facility] for elevated blood sugar and low blood pressure. EMS reports 78/36, CBG 433, SB on monitor. Pt alert on arrival.

## 2016-10-29 NOTE — Discharge Instructions (Signed)
Please recheck William Byrd glucose every 2  hours after he gets home. He will also need to have his creatinine rechecked. Return to the emergency department for any new or worrisome symptoms.

## 2016-10-29 NOTE — ED Provider Notes (Signed)
[facility] Emergency Department Provider Note       Time seen: ----------------------------------------- 2:39 PM on 10/29/2016 -----------------------------------------  Level V caveat: History/ROS limited by dementia   I have reviewed the triage vital signs and the nursing notes.   HISTORY   Chief Complaint Hyperglycemia and Hypotension    HPI William Byrd is a 83 y.o. male who presents to the ED for high blood sugar and low blood pressure. Patient arrived by EMS from [facility] for these findings. It is unclear what prompted the initial call for EMS. EMS reports a blood pressure of 78/36 with a blood sugar of 433. Patient is complaining that the bed is making his back sore otherwise he denies complaints. He has a history of dementia.   Past Medical History:  Diagnosis Date  . Arthritis   . Background retinopathy   . Cervicalgia   . Chronic kidney disease    follow up with a Dr.   . Chronic systolic CHF (congestive heart failure) (HCC)   . Congenital heart failure (HCC)   . Degeneration of lumbar or lumbosacral intervertebral disc   . Dementia   . Diabetes mellitus type 2, uncomplicated (HCC) 1996   on insulin since 2011, last eye exam with Dr. Inez Pilgrim 07/2013  . Diabetes mellitus without complication (HCC)   . DVT (deep venous thrombosis) (HCC)   . Essential hypertension    unspecified  . Generalized weakness   . Glaucoma   . Hypercholesteremia   . Hypertension   . Long-term insulin use (HCC)   . Moderate dementia   . Peripheral polyneuropathy   . Peripheral vascular disease (HCC)   . Pneumonia, unspecified organism 04/01/2014  . Pure hypercholesterolemia   . Spinal stenosis   . Spinal stenosis, lumbar region without neurogenic claudication     Patient Active Problem List   Diagnosis Date Noted  . Dementia in other diseases classified elsewhere without behavioral disturbance 06/20/2016  . Other intervertebral disc  degeneration, lumbar region 06/20/2016  . Type 2 diabetes mellitus with unspecified diabetic retinopathy without macular edema (HCC) 06/20/2016  . Type 2 diabetes mellitus with other diabetic ophthalmic complication (HCC) 06/20/2016  . Glaucoma 06/20/2016  . Type 2 diabetes mellitus with diabetic chronic kidney disease (HCC) 06/20/2016  . Type 2 diabetes mellitus with diabetic polyneuropathy (HCC) 06/20/2016  . 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 Hypertensive heart disease with heart failure (HCC) 06/20/2016  . Major depression, single episode 06/20/2016  . Chronic systolic (congestive) heart failure (HCC) 06/20/2016  . Benign prostatic hyperplasia without lower urinary tract symptoms 06/20/2016  . Pure hypercholesterolemia, unspecified 06/20/2016  . Gastroesophageal reflux disease without esophagitis 06/20/2016  . Dysphagia, oral phase 06/20/2016  . Dermatitis associated with moisture 06/20/2016  . CVA (cerebral infarction) 07/15/2015  . HTN (hypertension) 09/05/2014  . Alzheimer's disease 09/05/2014  . Chronic kidney disease, stage III (moderate) 09/05/2014    Past Surgical History:  Procedure Laterality Date  . ANGIOPLASTY / STENTING FEMORAL  1999  . ANTERIOR FUSION CERVICAL SPINE  07/2009  . c-spine fusion N/A   . COLONOSCOPY  05/23/2004   Dr. Maryruth Bun, normal, PH colon polpys    Allergies Fosinopril  Social History Social History  Substance Use Topics  . Smoking status: Former Smoker    Packs/day: 0.50    Years: 46.00    Types: Cigarettes    Quit date: 06/11/1998  . Smokeless tobacco: Never Used  . Alcohol  use No    Review of Systems unknown, history of dementia   ____________________________________________   PHYSICAL EXAM:  VITAL SIGNS: ED Triage Vitals [10/29/16 1434]  Enc Vitals Group     BP      Pulse      Resp      Temp      Temp src      SpO2      Weight 183 lb (83 kg)     Height   (1.676 m)     Head Circumference      Peak Flow      Pain Score      Pain Loc      Pain Edu?      Excl. in GC?     Constitutional: Alert, no distress Eyes: Conjunctivae are normal. Normal extraocular movements. ENT   Head: Normocephalic and atraumatic.   Nose: No congestion/rhinnorhea.   Mouth/Throat: Mucous membranes are moist. There is food around his teeth and on his tongue   Neck: No stridor. Cardiovascular: Normal rate, regular rhythm. No murmurs, rubs, or gallops. Respiratory: Normal respiratory effort without tachypnea nor retractions. Breath sounds are clear and equal bilaterally. No wheezes/rales/rhonchi. Gastrointestinal: Soft and nontender. Normal bowel sounds Musculoskeletal: Limited range of motion of the extremities Neurologic:  No gross focal neurologic deficits are appreciated.  Skin:  Skin is warm, dry and intact. No rash noted. ____________________________________________  ED COURSE:  Pertinent labs & imaging results that were available during my care of the patient were reviewed by me and considered in my medical decision making (see chart for details). Patient presents for hyperglycemia and hypotension although when we checked his blood pressure was 114/78 and his blood sugar was 345, we will assess with labs and imaging as indicated.   Procedures ____________________________________________   LABS (pertinent positives/negatives)  Labs Reviewed  CBC WITH DIFFERENTIAL/PLATELET  COMPREHENSIVE METABOLIC PANEL  TROPONIN I  URINALYSIS, COMPLETE (UACMP) WITH MICROSCOPIC   ____________________________________________  FINAL ASSESSMENT AND PLAN  hyperglycemia   Plan: Patient's labs and workup are pending at this time. Patient had received an IV fluid bolus by EMS.    Emily Filbert, MD   Note: This note was generated in part or whole with voice recognition software. Voice recognition is usually quite accurate but there are  transcription errors that can and very often do occur. I apologize for any typographical errors that were not detected and corrected.     Emily Filbert, MD 10/29/16 [phone]

## 2016-10-29 NOTE — ED Notes (Signed)
Pt given diet coke, graham crackers, peanut butter and applesauce per Dr. Alphonzo Lemmings order to PO challenge pt. Pt wife at bedside.

## 2016-10-29 NOTE — ED Notes (Signed)
CBG 353 

## 2016-10-29 NOTE — ED Notes (Signed)
Pt tolerated two packs of graham crackers, two packages of peanut butter, applesauce and diet coke without difficulty.

## 2016-10-30 DIAGNOSIS — E1165 Type 2 diabetes mellitus with hyperglycemia: Secondary | ICD-10-CM | POA: Diagnosis not present

## 2016-10-30 LAB — BASIC METABOLIC PANEL
ANION GAP: 6 (ref 5–15)
BUN: 45 mg/dL — ABNORMAL HIGH (ref 6–20)
CHLORIDE: 107 mmol/L (ref 101–111)
CO2: 26 mmol/L (ref 22–32)
Calcium: 8.8 mg/dL — ABNORMAL LOW (ref 8.9–10.3)
Creatinine, Ser: 2.35 mg/dL — ABNORMAL HIGH (ref 0.61–1.24)
GFR calc non Af Amer: 24 mL/min — ABNORMAL LOW (ref >60)
GFR, EST AFRICAN AMERICAN: 28 mL/min — AB (ref >60)
GLUCOSE: 321 mg/dL — AB (ref 65–99)
Potassium: 4.9 mmol/L (ref 3.5–5.1)
Sodium: 139 mmol/L (ref 135–145)

## 2016-10-30 LAB — URINALYSIS, COMPLETE (UACMP) WITH MICROSCOPIC
BILIRUBIN URINE: NEGATIVE
Bacteria, UA: NONE SEEN
Hgb urine dipstick: NEGATIVE
KETONES UR: NEGATIVE mg/dL
Leukocytes, UA: NEGATIVE
Nitrite: NEGATIVE
PH: 5 (ref 5.0–8.0)
Protein, ur: 100 mg/dL — AB
SPECIFIC GRAVITY, URINE: 1.014 (ref 1.005–1.030)

## 2016-10-30 LAB — GLUCOSE, CAPILLARY
GLUCOSE-CAPILLARY: 284 mg/dL — AB (ref 65–99)
Glucose-Capillary: 323 mg/dL — ABNORMAL HIGH (ref 65–99)

## 2016-10-30 MED ORDER — INSULIN ASPART 100 UNIT/ML ~~LOC~~ SOLN
10.0000 [IU] | Freq: Once | SUBCUTANEOUS | Status: AC
Start: 2016-10-30 — End: 2016-10-30
  Administered 2016-10-30: 10 [IU] via INTRAVENOUS
  Filled 2016-10-30: qty 1

## 2016-10-30 NOTE — ED Notes (Signed)
Prior to EMS arrival, Pt cleaned, brief changed, and full linen changed.

## 2016-10-30 NOTE — ED Notes (Signed)
CBG 284 

## 2016-10-30 NOTE — ED Notes (Signed)
Patient discharge and follow up information reviewed with patient by ED nursing staff and patient given the opportunity to ask questions pertaining to ED visit and discharge plan of care. Patient advised that should symptoms not continue to improve, resolve entirely, or should new symptoms develop then a follow up visit with their PCP or a return visit to the ED may be warranted. Patient verbalized consent and understanding of discharge plan of care including potential need for further evaluation. Patient being discharged in stable condition per attending ED physician on duty.   Report called to [place] at [facility] [place], [employer].

## 2016-10-30 NOTE — ED Notes (Signed)
Unable to obtain Discharge signature; pt has Alzheimer's; report called into [address] of [place], [place], spoke with [place].

## 2016-11-10 ENCOUNTER — Encounter
Admission: RE | Admit: 2016-11-10 | Discharge: 2016-11-10 | Disposition: A | Discharge status: Home / Self Care | Payer: Medicare Other | Source: Ambulatory Visit | Attending: Internal Medicine | Admitting: Internal Medicine

## 2016-11-11 ENCOUNTER — Encounter: Payer: Self-pay | Admitting: Gerontology

## 2016-11-11 ENCOUNTER — Non-Acute Institutional Stay (SKILLED_NURSING_FACILITY): Payer: Medicare Other | Admitting: Gerontology

## 2016-11-11 DIAGNOSIS — H409 Unspecified glaucoma: Secondary | ICD-10-CM | POA: Diagnosis not present

## 2016-11-11 DIAGNOSIS — F329 Major depressive disorder, single episode, unspecified: Secondary | ICD-10-CM | POA: Diagnosis not present

## 2016-11-11 DIAGNOSIS — N400 Enlarged prostate without lower urinary tract symptoms: Secondary | ICD-10-CM

## 2016-11-11 DIAGNOSIS — E11319 Type 2 diabetes mellitus with unspecified diabetic retinopathy without macular edema: Secondary | ICD-10-CM

## 2016-11-11 DIAGNOSIS — K219 Gastro-esophageal reflux disease without esophagitis: Secondary | ICD-10-CM

## 2016-11-11 DIAGNOSIS — L308 Other specified dermatitis: Secondary | ICD-10-CM

## 2016-11-11 DIAGNOSIS — Z794 Long term (current) use of insulin: Secondary | ICD-10-CM

## 2016-11-11 DIAGNOSIS — I5022 Chronic systolic (congestive) heart failure: Secondary | ICD-10-CM

## 2016-11-11 DIAGNOSIS — F0280 Dementia in other diseases classified elsewhere without behavioral disturbance: Secondary | ICD-10-CM | POA: Diagnosis not present

## 2016-11-11 DIAGNOSIS — E7800 Pure hypercholesterolemia, unspecified: Secondary | ICD-10-CM

## 2016-11-11 DIAGNOSIS — E1122 Type 2 diabetes mellitus with diabetic chronic kidney disease: Secondary | ICD-10-CM | POA: Diagnosis not present

## 2016-11-11 DIAGNOSIS — I82502 Chronic embolism and thrombosis of unspecified deep veins of left lower extremity: Secondary | ICD-10-CM

## 2016-11-11 DIAGNOSIS — R1311 Dysphagia, oral phase: Secondary | ICD-10-CM

## 2016-11-11 DIAGNOSIS — E1142 Type 2 diabetes mellitus with diabetic polyneuropathy: Secondary | ICD-10-CM | POA: Diagnosis not present

## 2016-11-11 DIAGNOSIS — M5136 Other intervertebral disc degeneration, lumbar region: Secondary | ICD-10-CM | POA: Diagnosis not present

## 2016-11-11 DIAGNOSIS — G301 Alzheimer's disease with late onset: Secondary | ICD-10-CM | POA: Diagnosis not present

## 2016-11-11 DIAGNOSIS — I10 Essential (primary) hypertension: Secondary | ICD-10-CM

## 2016-11-11 DIAGNOSIS — M51369 Other intervertebral disc degeneration, lumbar region without mention of lumbar back pain or lower extremity pain: Secondary | ICD-10-CM

## 2016-11-11 DIAGNOSIS — E1139 Type 2 diabetes mellitus with other diabetic ophthalmic complication: Secondary | ICD-10-CM

## 2016-11-11 DIAGNOSIS — I110 Hypertensive heart disease with heart failure: Secondary | ICD-10-CM

## 2016-11-11 NOTE — Progress Notes (Signed)
Location:   [facility] Room Number: [phone] Place of Service:  SNF [phone]) Provider:  Lorenso Quarry, NP-C  Marisue Ivan, MD  Patient Care Team: Marisue Ivan, MD as PCP - General (Family Medicine)  Extended Emergency Contact Information Primary Emergency Contact: [name] Address: [phone]          [place], [place] [address] [place] Home Phone: [phone] Relation: Spouse  Code Status:  FULL Goals of care: Advanced Directive information Advanced Directives 11/11/2016  Does Patient Have a Medical Advance Directive? No  Type of Advance Directive -  Does patient want to make changes to medical advance directive? -  Copy of Healthcare Power of Attorney in Chart? -  Would patient like information on creating a medical advance directive? -     Chief Complaint  Patient presents with  . Medical Management of Chronic Issues    Routine Visit    HPI:  Pt is a 83 y.o. male seen today for medical management of chronic diseases. Pt has had several episodes recently of PNA, AMS having pt visit the ED. He seems to be having a slow decline. Pt has CHF with chronic BLE edema. Pt tends to spend a lot of time in bed and does not social often. No falls. No c/o pain. Depression is well managed. Good appetite, has regular BMs. Pt has dementia, making ROS limited. VSS. No other complaints.     Past Medical History:  Diagnosis Date  . Arthritis   . Background retinopathy   . Cervicalgia   . Chronic kidney disease    follow up with a Dr.   . Chronic systolic CHF (congestive heart failure) (HCC)   . Congenital heart failure (HCC)   . Degeneration of lumbar or lumbosacral intervertebral disc   . Dementia   . Diabetes mellitus type 2, uncomplicated (HCC) 1996   on insulin since 2011, last eye exam with Dr. Inez Pilgrim 07/2013  . Diabetes mellitus without complication (HCC)   . DVT (deep venous thrombosis) (HCC)   . Essential  hypertension    unspecified  . Generalized weakness   . Glaucoma   . Hypercholesteremia   . Hypertension   . Long-term insulin use (HCC)   . Moderate dementia   . Peripheral polyneuropathy   . Peripheral vascular disease (HCC)   . Pneumonia, unspecified organism 04/01/2014  . Pure hypercholesterolemia   . Spinal stenosis   . Spinal stenosis, lumbar region without neurogenic claudication    Past Surgical History:  Procedure Laterality Date  . ANGIOPLASTY / STENTING FEMORAL  1999  . ANTERIOR FUSION CERVICAL SPINE  07/2009  . c-spine fusion N/A   . COLONOSCOPY  05/23/2004   Dr. Maryruth Bun, normal, PH colon polpys    Allergies  Allergen Reactions  . Fosinopril Other (See Comments)    Other reaction(s): Other (See Comments) Hyperkalemia Reaction: Hyperkalemia     Allergies as of 11/11/2016      Reactions   Fosinopril Other (See Comments)   Other reaction(s): Other (See Comments) Hyperkalemia Reaction: Hyperkalemia       Medication List       Accurate as of 11/11/16  4:23 PM. Always use your most recent med list.          acetaminophen 325 MG tablet Commonly known as:  TYLENOL Take 650 mg by mouth every 6 (six) hours as needed for mild pain, moderate pain, fever or headache.   albuterol 108 (90 Base) MCG/ACT  inhaler Commonly known as:  PROVENTIL HFA;VENTOLIN HFA Inhale 2 puffs into the lungs every 4 (four) hours as needed for wheezing or shortness of breath.   amLODipine 10 MG tablet Commonly known as:  NORVASC Take 1 tablet (10 mg total) by mouth daily.   benzonatate 100 MG capsule Commonly known as:  TESSALON Take 200 mg by mouth every 8 (eight) hours as needed for cough.   bisacodyl 5 MG EC tablet Commonly known as:  DULCOLAX Take 1 tablet (5 mg total) by mouth daily as needed for moderate constipation.   cholecalciferol 1000 units tablet Commonly known as:  VITAMIN D Take 1,000 Units by mouth daily.   donepezil 10 MG tablet Commonly known as:   ARICEPT Take 10 mg by mouth at bedtime.   DULoxetine 60 MG capsule Commonly known as:  CYMBALTA Take 60 mg by mouth daily.   ENTRESTO 49-51 MG Generic drug:  sacubitril-valsartan Take 1 tablet by mouth 2 (two) times daily.   finasteride 5 MG tablet Commonly known as:  PROSCAR Take 5 mg by mouth daily. *staff : do not handle without gloves*   furosemide 20 MG tablet Commonly known as:  LASIX Take 2 tablets (40 mg) by mouth daily each morning. Take an additional 1 tablet (20 mg) by mouth  6 hours later.   GLUCERNA Liqd Take 1 Can by mouth daily.   HYDROcodone-acetaminophen 5-325 MG tablet Commonly known as:  NORCO/VICODIN Take 1 tablet by mouth every 6 (six) hours as needed for moderate pain.   insulin regular 100 units/mL injection Commonly known as:  NOVOLIN R,HUMULIN R Inject 3-12 Units into the skin 3 (three) times daily before meals. If BS < 60 call NP/PA, If BS is 175 to 250 give 3 units, 251 to 325 give 6 units, 326 to 450, give 10 units, if BS > 450 give 12 units and call NP/PA, check bs ac&hs, if < 175 document 0 units   metoprolol tartrate 25 MG tablet Commonly known as:  LOPRESSOR Take 1 tablet (25 mg total) by mouth 2 (two) times daily.   NAMENDA XR 28 MG Cp24 24 hr capsule Generic drug:  memantine Take 28 mg by mouth daily.   pantoprazole 20 MG tablet Commonly known as:  PROTONIX Take 20 mg by mouth daily.   pregabalin 75 MG capsule Commonly known as:  LYRICA Take 1 capsule (75 mg total) by mouth 2 (two) times daily.   senna 8.6 MG Tabs tablet Commonly known as:  SENOKOT Take 1 tablet (8.6 mg total) by mouth daily.   STOOL SOFTENER 100 MG capsule Generic drug:  docusate sodium Take 100 mg by mouth daily as needed for mild constipation or moderate constipation.   tamsulosin 0.4 MG Caps capsule Commonly known as:  FLOMAX Take 0.4 mg by mouth at bedtime.   timolol 0.5 % ophthalmic solution Commonly known as:  TIMOPTIC Place 1 drop into both eyes 2  (two) times daily.   TOUJEO SOLOSTAR 300 UNIT/ML Sopn Generic drug:  Insulin Glargine Inject 60 Units into the skin every morning. 8 am   Triad Hydrophilic Wound Dressi Pste Apply cream topically to macerated areas of buttocks daily and prn   XARELTO 20 MG Tabs tablet Generic drug:  rivaroxaban Take 1 tablet by mouth daily.       Review of Systems  Unable to perform ROS: Dementia  Constitutional: Negative for activity change, appetite change, chills, diaphoresis and fever.  HENT: Negative for congestion, sneezing, sore throat, trouble swallowing  and voice change.   Eyes: Negative.   Respiratory: Negative for apnea, cough, choking, chest tightness, shortness of breath and wheezing.   Cardiovascular: Negative for chest pain, palpitations and leg swelling.  Gastrointestinal: Negative for abdominal distention, abdominal pain, constipation, diarrhea and nausea.  Genitourinary: Negative for difficulty urinating, dysuria, frequency and urgency.  Musculoskeletal: Negative for back pain, gait problem and myalgias. Arthralgias: typical arthritis.  Skin: Negative for color change, pallor, rash and wound.  Neurological: Positive for weakness. Negative for dizziness, tremors, syncope, speech difficulty, numbness and headaches.  Psychiatric/Behavioral: Negative for agitation and behavioral problems.  All other systems reviewed and are negative.   Immunization History  Administered Date(s) Administered  . Influenza Split 12/04/2014  . Influenza,inj,Quad PF,6+ Mos 11/11/2015  . Influenza-Unspecified 11/17/2011, 12/04/2014  . PPD Test 02/25/2014, 03/11/2014, 04/08/2016  . Pneumococcal Polysaccharide-23 02/11/2008   Pertinent  Health Maintenance Due  Topic Date Due  . FOOT EXAM  07/16/1941  . OPHTHALMOLOGY EXAM  07/16/1941  . URINE MICROALBUMIN  07/16/1941  . PNA vac Low Risk Adult (2 of 2 - PCV13) 02/10/2009  . HEMOGLOBIN A1C  03/08/2015  . INFLUENZA VACCINE  09/10/2016   No flowsheet  data found. Functional Status Survey:    Vitals:   11/11/16 1609  BP: 128/61  Pulse: 72  Resp: 16  Temp: 98.5 F (36.9 C)  SpO2: 98%  Weight: 184 lb 12.8 oz (83.8 kg)  Height:  (1.676 m)   Body mass index is 29.83 kg/m. Physical Exam  Constitutional: Vital signs are normal. He appears well-developed and well-nourished. He is active and cooperative. He does not appear ill. No distress.  HENT:  Head: Normocephalic and atraumatic.  Mouth/Throat: Uvula is midline, oropharynx is clear and moist and mucous membranes are normal. Mucous membranes are not pale, not dry and not cyanotic.  Eyes: Pupils are equal, round, and reactive to light. Conjunctivae, EOM and lids are normal.  Neck: Trachea normal, normal range of motion and full passive range of motion without pain. Neck supple. No JVD present. No tracheal deviation, no edema and no erythema present. No thyromegaly present.  Cardiovascular: Normal rate, regular rhythm, normal heart sounds, intact distal pulses and normal pulses.  Exam reveals no gallop, no distant heart sounds and no friction rub.   No murmur heard. Pulses:      Dorsalis pedis pulses are 2+ on the right side, and 2+ on the left side.  Pulmonary/Chest: Effort normal. No accessory muscle usage. No respiratory distress. He has no decreased breath sounds. He has no wheezes. He has no rhonchi. He has no rales. He exhibits no tenderness.  Abdominal: Normal appearance. He exhibits no distension and no ascites. Bowel sounds are decreased. There is no tenderness. There is no CVA tenderness.  Abdomen distended and taut. Non-tender. Sluggish bowel sounds.  Musculoskeletal: Normal range of motion. He exhibits no edema or tenderness.  Expected osteoarthritis, stiffness  Neurological: He is alert. He has normal strength. He is disoriented (to place).  Skin: Skin is warm, dry and intact. No rash noted. He is not diaphoretic. No cyanosis or erythema. No pallor. Nails show no  clubbing.  Psychiatric: He has a normal mood and affect. His speech is normal and behavior is normal. Judgment and thought content normal. Cognition and memory are normal.  Nursing note and vitals reviewed.   Labs reviewed:  Recent Labs  11/13/15 0426  07/18/16 1100 10/29/16 1440 10/30/16 0015  NA 139  < > 138 139 139  K  3.2*  < > 4.2 4.9 4.9  CL 106  < > 102 107 107  CO2 26  < > GLUCOSE 100*  < > 174* 347* 321*  BUN 23*  < > 35* 40* 45*  CREATININE 1.61*  < > 1.95* 2.29* 2.35*  CALCIUM 9.0  < > 9.0 8.9 8.8*  MG 1.9  --  2.2  --   --   < > = values in this interval not displayed.  Recent Labs  04/24/16 1340 07/18/16 1100 10/29/16 1440  AST 24 46* 23  ALT 33 80* 15*  ALKPHOS 92 83 61  BILITOT 0.2* 0.6 0.7  PROT 7.8 7.5 7.0  ALBUMIN 3.0* 3.2* 3.0*    Recent Labs  04/24/16 1340 07/18/16 1100 10/29/16 1440  WBC 6.8 4.7 5.8  NEUTROABS 4.9 3.0 3.9  HGB 10.3* 10.3* 9.6*  HCT 30.2* 30.5* 28.2*  MCV 92.4 93.9 93.1  PLT 322 219 215   Lab Results  Component Value Date   TSH 4.410 07/18/2016   Lab Results  Component Value Date   HGBA1C 9.5 (H) 09/05/2014   Lab Results  Component Value Date   CHOL 177 07/18/2016   HDL 34 (L) 07/18/2016   LDLCALC 122 (H) 07/18/2016   TRIG 105 07/18/2016   CHOLHDL 5.2 07/18/2016    Significant Diagnostic Results in last 30 days:  Dg Chest 1 View  Result Date: 10/29/2016 CLINICAL DATA:  Hyperglycemia and hypotension today. Vomiting today. EXAM: CHEST 1 VIEW COMPARISON:  Single-view of the chest 04/05/2016 and PA and lateral chest 11/13/2015. FINDINGS: The lungs are clear. Heart size is upper normal. No pneumothorax or pleural fluid. Aortic atherosclerosis is noted. IMPRESSION: No acute disease. Electronically Signed   By: Drusilla Kanner M.D.   On: 10/29/2016 15:28    Assessment/Plan 1. Late onset Alzheimer's disease without behavioral disturbance 2. Dementia in other diseases classified elsewhere without  behavioral disturbance  Stable  Continue Donepezil 10 mg po Q HS  Continue Namenda 28 mg XR po Q Day  3. Other intervertebral disc degeneration, lumbar region  Stable   Continue Tylenol 650 mg po Q 4 hours prn pain  4. Glaucoma, unspecified glaucoma type, unspecified laterality  Stable    Continue Timolol 0.5%- 1 drop in both eyes BID  5. 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 Type 2 diabetes mellitus with other diabetic ophthalmic complication (HCC) 7. Type 2 diabetes mellitus with chronic kidney disease, with long-term current use of insulin, unspecified CKD stage (HCC) 8. Type 2 diabetes mellitus with diabetic polyneuropathy, with long-term current use of insulin (HCC)  Stable  Continue Toujeo 60 units SQ in the AM  Continue Novolin R- SSI prn  FSBS AC/HS  Continue Lyrica 75 mg po BID  9. 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 Hypertensive heart disease with heart failure (HCC)  Stable   Continue Entresto 49-51 1 tablet po BID  11. Major depressive disorder with single episode, remission status unspecified  Stable   Continue Cholecalciferol 1,000 units po Q Day  Change Duloxetine 20 mg po Q Day  12. Chronic systolic (congestive) heart failure (HCC)  Stable   Continue Furosemide 40 mg po Q AM  Continue Furosemide 20 mg po Q afternoon  13. Benign prostatic hyperplasia  without lower urinary tract symptoms  stable  Continue Finasteride 5 mg po Q Day  Continue Prostate SR 160-250 po Q Day  Continue Tamslosin 0.4 mg po Q HS  14. Pure hypercholesterolemia, unspecified  Stable   Continue Atorvastatin 20 mg po Q Day  15. Gastroesophageal reflux disease without esophagitis  Stable   Continue Protonix 20 mg po Q  Day  16. Dysphagia, oral phase  Stable   Continue Glucerna 1 can BID  Continue Guaifenesin 10 mL po TID scheduled  17. Essential hypertension  Stable   Continue Amlodipine 10 mg po Q Day  Continue Metoprolol 25 mg po BID  18. Dermatitis associated with moisture  Triad Wound Dressing- liberal amount to area of skin irritation BID and prn   Family/ staff Communication:   Total Time:  Documentation:  Face to Face:  Family/Phone:   Labs/tests ordered: not due  Medication list reviewed and assessed for continued appropriateness. Monthly medication orders reviewed and signed.  Brynda Rim, NP-C Geriatrics [facility] [phone] [address], [place] [address] Cell Phone (Mon-Fri 8am-5pm):  [phone] On Call:  [phone] & follow prompts after 5pm & weekends Office Phone:  [phone] Office Fax:  [phone]

## 2016-11-14 ENCOUNTER — Encounter: Payer: Self-pay | Admitting: Emergency Medicine

## 2016-11-14 ENCOUNTER — Emergency Department: Payer: Medicare Other

## 2016-11-14 ENCOUNTER — Encounter: Payer: Self-pay | Admitting: Gerontology

## 2016-11-14 ENCOUNTER — Non-Acute Institutional Stay (SKILLED_NURSING_FACILITY): Payer: Medicare Other | Admitting: Gerontology

## 2016-11-14 ENCOUNTER — Emergency Department
Admission: EM | Admit: 2016-11-14 | Discharge: 2016-11-14 | Disposition: A | Discharge status: Home / Self Care | Payer: Medicare Other | Attending: Emergency Medicine | Admitting: Emergency Medicine

## 2016-11-14 DIAGNOSIS — N189 Chronic kidney disease, unspecified: Secondary | ICD-10-CM | POA: Diagnosis not present

## 2016-11-14 DIAGNOSIS — F0390 Unspecified dementia without behavioral disturbance: Secondary | ICD-10-CM | POA: Diagnosis not present

## 2016-11-14 DIAGNOSIS — R401 Stupor: Secondary | ICD-10-CM | POA: Diagnosis not present

## 2016-11-14 DIAGNOSIS — Z79899 Other long term (current) drug therapy: Secondary | ICD-10-CM | POA: Insufficient documentation

## 2016-11-14 DIAGNOSIS — E11319 Type 2 diabetes mellitus with unspecified diabetic retinopathy without macular edema: Secondary | ICD-10-CM | POA: Diagnosis not present

## 2016-11-14 DIAGNOSIS — Z87891 Personal history of nicotine dependence: Secondary | ICD-10-CM | POA: Diagnosis not present

## 2016-11-14 DIAGNOSIS — Z794 Long term (current) use of insulin: Secondary | ICD-10-CM | POA: Diagnosis not present

## 2016-11-14 DIAGNOSIS — I5022 Chronic systolic (congestive) heart failure: Secondary | ICD-10-CM | POA: Insufficient documentation

## 2016-11-14 DIAGNOSIS — Z8673 Personal history of transient ischemic attack (TIA), and cerebral infarction without residual deficits: Secondary | ICD-10-CM | POA: Diagnosis not present

## 2016-11-14 DIAGNOSIS — R531 Weakness: Secondary | ICD-10-CM | POA: Diagnosis not present

## 2016-11-14 DIAGNOSIS — Z7901 Long term (current) use of anticoagulants: Secondary | ICD-10-CM | POA: Insufficient documentation

## 2016-11-14 DIAGNOSIS — I130 Hypertensive heart and chronic kidney disease with heart failure and stage 1 through stage 4 chronic kidney disease, or unspecified chronic kidney disease: Secondary | ICD-10-CM | POA: Diagnosis not present

## 2016-11-14 DIAGNOSIS — R4182 Altered mental status, unspecified: Secondary | ICD-10-CM | POA: Diagnosis present

## 2016-11-14 LAB — CBC WITH DIFFERENTIAL/PLATELET
BASOS ABS: 0 10*3/uL (ref 0–0.1)
BASOS PCT: 1 %
EOS ABS: 0.2 10*3/uL (ref 0–0.7)
Eosinophils Relative: 2 %
HCT: 27 % — ABNORMAL LOW (ref 40.0–52.0)
HEMOGLOBIN: 9.4 g/dL — AB (ref 13.0–18.0)
Lymphocytes Relative: 9 %
Lymphs Abs: 0.8 10*3/uL — ABNORMAL LOW (ref 1.0–3.6)
MCH: 33 pg (ref 26.0–34.0)
MCHC: 34.7 g/dL (ref 32.0–36.0)
MCV: 94.9 fL (ref 80.0–100.0)
Monocytes Absolute: 0.7 10*3/uL (ref 0.2–1.0)
Monocytes Relative: 7 %
NEUTROS PCT: 81 %
Neutro Abs: 7.8 10*3/uL — ABNORMAL HIGH (ref 1.4–6.5)
PLATELETS: 225 10*3/uL (ref 150–440)
RBC: 2.85 MIL/uL — AB (ref 4.40–5.90)
RDW: 16.4 % — ABNORMAL HIGH (ref 11.5–14.5)
WBC: 9.6 10*3/uL (ref 3.8–10.6)

## 2016-11-14 LAB — PROTIME-INR
INR: 1.45
PROTHROMBIN TIME: 17.5 s — AB (ref 11.4–15.2)

## 2016-11-14 LAB — COMPREHENSIVE METABOLIC PANEL
ALT: 17 U/L (ref 17–63)
ANION GAP: 11 (ref 5–15)
AST: 21 U/L (ref 15–41)
Albumin: 3.2 g/dL — ABNORMAL LOW (ref 3.5–5.0)
Alkaline Phosphatase: 76 U/L (ref 38–126)
BILIRUBIN TOTAL: 0.7 mg/dL (ref 0.3–1.2)
BUN: 55 mg/dL — ABNORMAL HIGH (ref 6–20)
CO2: 26 mmol/L (ref 22–32)
Calcium: 9.1 mg/dL (ref 8.9–10.3)
Chloride: 101 mmol/L (ref 101–111)
Creatinine, Ser: 2.39 mg/dL — ABNORMAL HIGH (ref 0.61–1.24)
GFR, EST AFRICAN AMERICAN: 27 mL/min — AB (ref >60)
GFR, EST NON AFRICAN AMERICAN: 24 mL/min — AB (ref >60)
Glucose, Bld: 206 mg/dL — ABNORMAL HIGH (ref 65–99)
Potassium: 5 mmol/L (ref 3.5–5.1)
SODIUM: 138 mmol/L (ref 135–145)
TOTAL PROTEIN: 7.6 g/dL (ref 6.5–8.1)

## 2016-11-14 LAB — URINALYSIS, COMPLETE (UACMP) WITH MICROSCOPIC
Bacteria, UA: NONE SEEN
Bilirubin Urine: NEGATIVE
GLUCOSE, UA: NEGATIVE mg/dL
HGB URINE DIPSTICK: NEGATIVE
Ketones, ur: NEGATIVE mg/dL
LEUKOCYTES UA: NEGATIVE
NITRITE: NEGATIVE
Protein, ur: 30 mg/dL — AB
SPECIFIC GRAVITY, URINE: 1.013 (ref 1.005–1.030)
pH: 5 (ref 5.0–8.0)

## 2016-11-14 LAB — LACTIC ACID, PLASMA: LACTIC ACID, VENOUS: 1.9 mmol/L (ref 0.5–1.9)

## 2016-11-14 NOTE — ED Triage Notes (Signed)
Pt to ED via EMS from [facility] c/o wet cough, worsening AMS from baseline today.  Nurse states patient ate around 1000, possible aspiration.  Hx of Alzheimer's but worse from baseline per EMS.  Patient following some commands but not verbally answering questions.  EMS vitals 136/70 BP, 93 HR, 93% RA, 199 CBG, 98.2 temp.

## 2016-11-14 NOTE — Discharge Instructions (Signed)
You were evaluated for possible altered mental status and generalized weakness, and although no certain cause was found, your exam and evaluation are overall reassuring in the emergency department today.  return to the emergency departmentimmediately for any new or worsening condition including altered mental status, fever, trouble breathing, or any other symptoms concerning to you.

## 2016-11-14 NOTE — ED Notes (Signed)
EKG performed. Pt placed on monitor. Vitals taken

## 2016-11-14 NOTE — Progress Notes (Signed)
Location:   [facility] Room Number: [phone] Place of Service:  SNF [phone]) Provider:  Lorenso Quarry, NP-C  Marisue Ivan, MD  Patient Care Team: Marisue Ivan, MD as PCP - General (Family Medicine)  Extended Emergency Contact Information Primary Emergency Contact: [name] Address: [phone]          [place], [place] [address] [place] Home Phone: [phone] Relation: Spouse  Code Status:  FULL Goals of care: Advanced Directive information Advanced Directives 11/14/2016  Does Patient Have a Medical Advance Directive? No  Type of Advance Directive -  Does patient want to make changes to medical advance directive? -  Copy of Healthcare Power of Attorney in Chart? -  Would patient like information on creating a medical advance directive? -     Chief Complaint  Patient presents with  . Acute Visit    Follow up on altered mental status    HPI:  Pt is a 83 y.o. male seen today for an acute visit for Altered mental status. Called urgently to the room by nursing. Pt was minimally responsive. Lethargic. Minimal response to sternal rub. Delayed response to noxious pressure to nailbeds of B- great toes. Negative Plantar Reflex and Babinski. Unable to perform SLR of BLE. Minimal movement with command to plantar and dorsi-flex feet. Left hand in Decorticate posturing. Abnormal coordination of LUE. Moderate strength hand grips/push/pulls. Right stronger than left. Unable to consistently identify number fingers in front of face. Unable to follow 6 fields of gaze. Pupils small and constricted. No use of Opioids. CBG 189. Speech slurred. Disoriented. Nursing reports pt had a coarse cough earlier and then expectorated large amount of thick, yellow secretions when pt was laid back in bed. Temp 99.0. O2 sat 93% on RA. Otherwise, VSS. Pt unable to verbalize ROS or follow simple commands. Nursing to notify family of change of condition and  obtain permission to send to the ED.     Past Medical History:  Diagnosis Date  . Arthritis   . Background retinopathy   . Cervicalgia   . Chronic kidney disease    follow up with a Dr.   . Chronic systolic CHF (congestive heart failure) (HCC)   . Congenital heart failure (HCC)   . Degeneration of lumbar or lumbosacral intervertebral disc   . Dementia   . Diabetes mellitus type 2, uncomplicated (HCC) 1996   on insulin since 2011, last eye exam with Dr. Inez Pilgrim 07/2013  . Diabetes mellitus without complication (HCC)   . DVT (deep venous thrombosis) (HCC)   . Essential hypertension    unspecified  . Generalized weakness   . Glaucoma   . Hypercholesteremia   . Hypertension   . Long-term insulin use (HCC)   . Moderate dementia   . Peripheral polyneuropathy   . Peripheral vascular disease (HCC)   . Pneumonia, unspecified organism 04/01/2014  . Pure hypercholesterolemia   . Spinal stenosis   . Spinal stenosis, lumbar region without neurogenic claudication    Past Surgical History:  Procedure Laterality Date  . ANGIOPLASTY / STENTING FEMORAL  1999  . ANTERIOR FUSION CERVICAL SPINE  07/2009  . c-spine fusion N/A   . COLONOSCOPY  05/23/2004   Dr. Maryruth Bun, normal, PH colon polpys    Allergies  Allergen Reactions  . Fosinopril Other (See Comments)    Other reaction(s): Other (See Comments) Hyperkalemia Reaction: Hyperkalemia     Allergies as of 11/14/2016      Reactions  Fosinopril Other (See Comments)   Other reaction(s): Other (See Comments) Hyperkalemia Reaction: Hyperkalemia       Medication List       Accurate as of 11/14/16 12:45 PM. Always use your most recent med list.          acetaminophen 325 MG tablet Commonly known as:  TYLENOL Take 650 mg by mouth every 6 (six) hours as needed for mild pain, moderate pain, fever or headache.   albuterol 108 (90 Base) MCG/ACT inhaler Commonly known as:  PROVENTIL HFA;VENTOLIN HFA Inhale 2 puffs into the lungs  every 4 (four) hours as needed for wheezing or shortness of breath.   amLODipine 10 MG tablet Commonly known as:  NORVASC Take 1 tablet (10 mg total) by mouth daily.   benzonatate 100 MG capsule Commonly known as:  TESSALON Take 200 mg by mouth every 8 (eight) hours as needed for cough.   bisacodyl 5 MG EC tablet Commonly known as:  DULCOLAX Take 1 tablet (5 mg total) by mouth daily as needed for moderate constipation.   cholecalciferol 1000 units tablet Commonly known as:  VITAMIN D Take 1,000 Units by mouth daily.   donepezil 10 MG tablet Commonly known as:  ARICEPT Take 10 mg by mouth at bedtime.   DULoxetine 60 MG capsule Commonly known as:  CYMBALTA Take 60 mg by mouth daily.   ENTRESTO 49-51 MG Generic drug:  sacubitril-valsartan Take 1 tablet by mouth 2 (two) times daily.   finasteride 5 MG tablet Commonly known as:  PROSCAR Take 5 mg by mouth daily. *staff : do not handle without gloves*   furosemide 20 MG tablet Commonly known as:  LASIX Take 2 tablets (40 mg) by mouth daily each morning. Take an additional 1 tablet (20 mg) by mouth  6 hours later.   GLUCERNA Liqd Take 1 Can by mouth daily.   HYDROcodone-acetaminophen 5-325 MG tablet Commonly known as:  NORCO/VICODIN Take 1 tablet by mouth every 6 (six) hours as needed for moderate pain.   insulin regular 100 units/mL injection Commonly known as:  NOVOLIN R,HUMULIN R Inject 3-12 Units into the skin 3 (three) times daily before meals. If BS < 60 call NP/PA, If BS is 175 to 250 give 3 units, 251 to 325 give 6 units, 326 to 450, give 10 units, if BS > 450 give 12 units and call NP/PA, check bs ac&hs, if < 175 document 0 units   metoprolol tartrate 25 MG tablet Commonly known as:  LOPRESSOR Take 1 tablet (25 mg total) by mouth 2 (two) times daily.   NAMENDA XR 28 MG Cp24 24 hr capsule Generic drug:  memantine Take 28 mg by mouth daily.   pantoprazole 20 MG tablet Commonly known as:  PROTONIX Take 20 mg  by mouth daily.   pregabalin 75 MG capsule Commonly known as:  LYRICA Take 1 capsule (75 mg total) by mouth 2 (two) times daily.   senna 8.6 MG Tabs tablet Commonly known as:  SENOKOT Take 1 tablet (8.6 mg total) by mouth daily.   STOOL SOFTENER 100 MG capsule Generic drug:  docusate sodium Take 100 mg by mouth daily as needed for mild constipation or moderate constipation.   tamsulosin 0.4 MG Caps capsule Commonly known as:  FLOMAX Take 0.4 mg by mouth at bedtime.   timolol 0.5 % ophthalmic solution Commonly known as:  TIMOPTIC Place 1 drop into both eyes 2 (two) times daily.   TOUJEO SOLOSTAR 300 UNIT/ML Sopn Generic drug:  Insulin Glargine Inject 60 Units into the skin every morning. 8 am   Triad Hydrophilic Wound Dressi Pste Apply cream topically to macerated areas of buttocks daily and prn   XARELTO 20 MG Tabs tablet Generic drug:  rivaroxaban Take 1 tablet by mouth daily.       Review of Systems  Unable to perform ROS: Mental status change  Constitutional: Positive for activity change and fatigue. Negative for appetite change, chills, diaphoresis and fever.  HENT: Negative for congestion, sneezing, sore throat, trouble swallowing and voice change.   Respiratory: Positive for cough and shortness of breath. Negative for apnea, choking, chest tightness and wheezing.   Cardiovascular: Negative for chest pain, palpitations and leg swelling.  Gastrointestinal: Positive for diarrhea. Negative for abdominal distention, abdominal pain, constipation and nausea.  Genitourinary: Negative.  Negative for difficulty urinating, dysuria, frequency and urgency.  Musculoskeletal: Positive for gait problem. Negative for back pain and myalgias. Arthralgias: typical arthritis.  Skin: Negative for color change, pallor, rash and wound.  Neurological: Positive for syncope and weakness. Negative for dizziness, tremors, seizures, facial asymmetry, speech difficulty, numbness and headaches.    Psychiatric/Behavioral: Negative.  Negative for agitation and behavioral problems.  All other systems reviewed and are negative.   Immunization History  Administered Date(s) Administered  . Influenza Split 12/04/2014  . Influenza,inj,Quad PF,6+ Mos 11/11/2015  . Influenza-Unspecified 11/17/2011, 12/04/2014, 10/30/2016  . PPD Test 02/25/2014, 03/11/2014, 04/08/2016  . Pneumococcal Polysaccharide-23 02/11/2008   Pertinent  Health Maintenance Due  Topic Date Due  . FOOT EXAM  07/16/1941  . OPHTHALMOLOGY EXAM  07/16/1941  . URINE MICROALBUMIN  07/16/1941  . PNA vac Low Risk Adult (2 of 2 - PCV13) 02/10/2009  . HEMOGLOBIN A1C  03/08/2015  . INFLUENZA VACCINE  Completed   No flowsheet data found. Functional Status Survey:    Vitals:   11/14/16 1000  BP: (!) 125/54  Pulse: 96  Resp: 20  Temp: 99.7 F (37.6 C)  SpO2: 91%  Weight: 186 lb (84.4 kg)  Height:  (1.676 m)   Body mass index is 30.02 kg/m. Physical Exam  Constitutional: He appears well-developed and well-nourished. He appears listless. He appears ill. No distress.  HENT:  Head: Normocephalic and atraumatic.  Mouth/Throat: Uvula is midline, oropharynx is clear and moist and mucous membranes are normal. Mucous membranes are not pale, not dry and not cyanotic.  Eyes: Conjunctivae, EOM and lids are normal. Right pupil is not reactive. Left pupil is not reactive.  Neck: Trachea normal, normal range of motion and full passive range of motion without pain. Neck supple. No JVD present. No tracheal deviation, no edema and no erythema present. No thyromegaly present.  Cardiovascular: Normal rate, regular rhythm, normal heart sounds and intact distal pulses.  Exam reveals no gallop, no distant heart sounds and no friction rub.   No murmur heard. Pulses:      Dorsalis pedis pulses are 1+ on the right side, and 1+ on the left side.  No edema  Pulmonary/Chest: Effort normal. No accessory muscle usage. No respiratory  distress. He has decreased breath sounds in the right upper field, the right middle field, the left upper field and the left middle field. He has no wheezes. He has rhonchi (very faint) in the right lower field and the left lower field. He exhibits no tenderness.  Abdominal: Soft. Normal appearance and bowel sounds are normal. He exhibits distension (mild). He exhibits no ascites. There is no tenderness.  Musculoskeletal: Normal range of motion.  He exhibits no edema or tenderness.  Expected osteoarthritis, stiffness  Neurological: He has normal strength. He appears listless. He is disoriented. He displays no atrophy and no tremor. A sensory deficit is present. He exhibits abnormal muscle tone. He displays no seizure activity. Coordination abnormal.  Skin: Skin is warm, dry and intact. He is not diaphoretic. No cyanosis. No pallor. Nails show no clubbing.  Psychiatric: He has a normal mood and affect. His speech is normal and behavior is normal. Judgment and thought content normal. Cognition and memory are normal.  Nursing note and vitals reviewed.   Labs reviewed:  Recent Labs  07/18/16 1100 10/29/16 1440 10/30/16 0015  NA 138 139 139  K 4.2 4.9 4.9  CL 102 107 107  CO2 GLUCOSE 174* 347* 321*  BUN 35* 40* 45*  CREATININE 1.95* 2.29* 2.35*  CALCIUM 9.0 8.9 8.8*  MG 2.2  --   --     Recent Labs  04/24/16 1340 07/18/16 1100 10/29/16 1440  AST 24 46* 23  ALT 33 80* 15*  ALKPHOS 92 83 61  BILITOT 0.2* 0.6 0.7  PROT 7.8 7.5 7.0  ALBUMIN 3.0* 3.2* 3.0*    Recent Labs  04/24/16 1340 07/18/16 1100 10/29/16 1440  WBC 6.8 4.7 5.8  NEUTROABS 4.9 3.0 3.9  HGB 10.3* 10.3* 9.6*  HCT 30.2* 30.5* 28.2*  MCV 92.4 93.9 93.1  PLT 322 219 215   Lab Results  Component Value Date   TSH 4.410 07/18/2016   Lab Results  Component Value Date   HGBA1C 9.5 (H) 09/05/2014   Lab Results  Component Value Date   CHOL 177 07/18/2016   HDL 34 (L) 07/18/2016   LDLCALC 122 (H)  07/18/2016   TRIG 105 07/18/2016   CHOLHDL 5.2 07/18/2016    Significant Diagnostic Results in last 30 days:  Dg Chest 1 View  Result Date: 10/29/2016 CLINICAL DATA:  Hyperglycemia and hypotension today. Vomiting today. EXAM: CHEST 1 VIEW COMPARISON:  Single-view of the chest 04/05/2016 and PA and lateral chest 11/13/2015. FINDINGS: The lungs are clear. Heart size is upper normal. No pneumothorax or pleural fluid. Aortic atherosclerosis is noted. IMPRESSION: No acute disease. Electronically Signed   By: Drusilla Kanner M.D.   On: 10/29/2016 15:28    Assessment/Plan 1. Stupor  Send to ED to eval and treat  Family/ staff Communication:   Total Time:  Documentation:  Face to Face:  Family/Phone: nursing notified wife via telephone   Labs/tests ordered:    Medication list reviewed and assessed for continued appropriateness.  Brynda Rim, NP-C [facility] [phone] [address] [place], [place] [address] Cell Phone (Mon-Fri 8am-5pm):  [phone] On Call:  [phone] & follow prompts after 5pm & weekends Office Phone:  [phone] Office Fax:  [phone]

## 2016-11-14 NOTE — ED Notes (Signed)
Notified by secretary Misty Stanley of EMS arrival at this time; nurse was on the floor with another patient. Gave report to Diplomatic Services operational officer for EMS. Per Diplomatic Services operational officer EMS discontinued IV's.

## 2016-11-14 NOTE — ED Provider Notes (Addendum)
[facility] Emergency Department Provider Note ____________________________________________   I have reviewed the triage vital signs and the triage nursing note.  HISTORY  Chief Complaint Altered Mental Status   Historian Patient is a poor historian overall History obtained from wife at bedside and  nh staff by phone  HPI William Byrd is a 83 y.o. male presents from [facility] nursing home, is mostly bedbound, but is able to be transferred to a wheelchair with a lift, by chart review was recently started on Augmentin for pneumonia, based on the notes indicated to nursing staff when patient was dropped off by EMS, there was concern about patient having decreased level of consciousness or responsiveness to his own baseline, and possibly was coughing or had possible aspiration event during lunch.  There is no reported low oxygen saturations. Patient was apparently not complaining of any pains. Next the next line patient is able to talk and speak and interact with me and states he has some chronic low back pain, and has trouble moving his legs at baseline but does not have any focal weakness or numbness. Wife states he is speaking and interacting with her normally and is essentially at his baseline.   I was able to speak with both a nurse was present as well as the nurse practitioner who evaluated the patient earlier and she was to send the patient over. The nurse practitioner noted that the patient was kind of slow to respond which was a little unusual for him, and seemed to have left-sided upper extremity weakness and bilateral lower extremity weakness worse than baseline. They noted that when they tried to turn him side to side he was more stiff than usual. She had apparently called the wife and the wife opted to have the patient evaluated in the emergency department. The patient is a 42 on Xarelto.   Past Medical History:  Diagnosis Date  . Arthritis   . Background  retinopathy   . Cervicalgia   . Chronic kidney disease    follow up with a Dr.   . Chronic systolic CHF (congestive heart failure) (HCC)   . Congenital heart failure (HCC)   . Degeneration of lumbar or lumbosacral intervertebral disc   . Dementia   . Diabetes mellitus type 2, uncomplicated (HCC) 1996   on insulin since 2011, last eye exam with Dr. Inez Pilgrim 07/2013  . Diabetes mellitus without complication (HCC)   . DVT (deep venous thrombosis) (HCC)   . Essential hypertension    unspecified  . Generalized weakness   . Glaucoma   . Hypercholesteremia   . Hypertension   . Long-term insulin use (HCC)   . Moderate dementia   . Peripheral polyneuropathy   . Peripheral vascular disease (HCC)   . Pneumonia, unspecified organism 04/01/2014  . Pure hypercholesterolemia   . Spinal stenosis   . Spinal stenosis, lumbar region without neurogenic claudication     Patient Active Problem List   Diagnosis Date Noted  . Dementia in other diseases classified elsewhere without behavioral disturbance 06/20/2016  . Other intervertebral disc degeneration, lumbar region 06/20/2016  . Type 2 diabetes mellitus with unspecified diabetic retinopathy without macular edema (HCC) 06/20/2016  . Type 2 diabetes mellitus with other diabetic ophthalmic complication (HCC) 06/20/2016  . Glaucoma 06/20/2016  . Type 2 diabetes mellitus with diabetic chronic kidney disease (HCC) 06/20/2016  . Type 2 diabetes mellitus with diabetic polyneuropathy (HCC) 06/20/2016  . 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 Hypertensive heart disease with heart failure (HCC) 06/20/2016  . Major depression, single episode 06/20/2016  . Chronic systolic (congestive) heart failure (HCC) 06/20/2016  . Benign prostatic hyperplasia without lower urinary tract symptoms 06/20/2016  . Pure hypercholesterolemia, unspecified 06/20/2016  . Gastroesophageal reflux disease without  esophagitis 06/20/2016  . Dysphagia, oral phase 06/20/2016  . Dermatitis associated with moisture 06/20/2016  . CVA (cerebral infarction) 07/15/2015  . HTN (hypertension) 09/05/2014  . Alzheimer's disease 09/05/2014  . Chronic kidney disease, stage III (moderate) (HCC) 09/05/2014    Past Surgical History:  Procedure Laterality Date  . ANGIOPLASTY / STENTING FEMORAL  1999  . ANTERIOR FUSION CERVICAL SPINE  07/2009  . c-spine fusion N/A   . COLONOSCOPY  05/23/2004   Dr. Maryruth Bun, normal, PH colon polpys    Prior to Admission medications   Medication Sig Start Date End Date Taking? Authorizing Provider  acetaminophen (TYLENOL) 325 MG tablet Take 650 mg by mouth every 6 (six) hours as needed for mild pain, moderate pain, fever or headache.    [provider]  albuterol (PROVENTIL HFA;VENTOLIN HFA) 108 (90 Base) MCG/ACT inhaler Inhale 2 puffs into the lungs every 4 (four) hours as needed for wheezing or shortness of breath.    [provider]  amLODipine (NORVASC) 10 MG tablet Take 1 tablet (10 mg total) by mouth daily. 11/15/15   Altamese Dilling, MD  benzonatate (TESSALON) 100 MG capsule Take 200 mg by mouth every 8 (eight) hours as needed for cough.    [provider]  bisacodyl (DULCOLAX) 5 MG EC tablet Take 1 tablet (5 mg total) by mouth daily as needed for moderate constipation. 11/14/15   Altamese Dilling, MD  cholecalciferol (VITAMIN D) 1000 UNITS tablet Take 1,000 Units by mouth daily.    [provider]  docusate sodium (STOOL SOFTENER) 100 MG capsule Take 100 mg by mouth daily as needed for mild constipation or moderate constipation.     [provider]  donepezil (ARICEPT) 10 MG tablet Take 10 mg by mouth at bedtime.  07/05/14   [provider]  DULoxetine (CYMBALTA) 60 MG capsule Take 60 mg by mouth daily.  06/01/14   [provider]  finasteride (PROSCAR) 5 MG tablet Take 5 mg by mouth daily. *staff : do not  handle without gloves* 07/19/14   [provider]  furosemide (LASIX) 20 MG tablet Take 2 tablets (40 mg) by mouth daily each morning. Take an additional 1 tablet (20 mg) by mouth  6 hours later.    [provider]  GLUCERNA Frederica Kuster) LIQD Take 1 Can by mouth daily.     [provider]  HYDROcodone-acetaminophen (NORCO/VICODIN) 5-325 MG tablet Take 1 tablet by mouth every 6 (six) hours as needed for moderate pain. 11/14/15   Altamese Dilling, MD  Insulin Glargine (TOUJEO SOLOSTAR) 300 UNIT/ML SOPN Inject 60 Units into the skin every morning. 8 am     [provider]  insulin regular (NOVOLIN R,HUMULIN R) 100 units/mL injection Inject 3-12 Units into the skin 3 (three) times daily before meals. If BS < 60 call NP/PA, If BS is 175 to 250 give 3 units, 251 to 325 give 6 units, 326 to 450, give 10 units, if BS > 450 give 12 units and call NP/PA, check bs ac&hs, if < 175 document 0 units    [provider]  memantine (NAMENDA XR) 28 MG CP24 24 hr capsule Take 28 mg by mouth  daily.     [provider]  metoprolol tartrate (LOPRESSOR) 25 MG tablet Take 1 tablet (25 mg total) by mouth 2 (two) times daily. 09/08/14   [name], MD  pantoprazole (PROTONIX) 20 MG tablet Take 20 mg by mouth daily.    [provider]  pregabalin (LYRICA) 75 MG capsule Take 1 capsule (75 mg total) by mouth 2 (two) times daily. 10/20/16   Lorenso Quarry, NP  rivaroxaban (XARELTO) 20 MG TABS tablet Take 1 tablet by mouth daily.  07/18/14   [provider]  sacubitril-valsartan (ENTRESTO) 49-51 MG Take 1 tablet by mouth 2 (two) times daily.    [provider]  senna (SENOKOT) 8.6 MG TABS tablet Take 1 tablet (8.6 mg total) by mouth daily. 11/14/15   Altamese Dilling, MD  tamsulosin (FLOMAX) 0.4 MG CAPS capsule Take 0.4 mg by mouth at bedtime.    [provider]  timolol (TIMOPTIC) 0.5 % ophthalmic solution Place 1 drop into both eyes 2  (two) times daily.     [provider]  Wound Dressings (TRIAD HYDROPHILIC WOUND DRESSI) PSTE Apply cream topically to macerated areas of buttocks daily and prn    [provider]    Allergies  Allergen Reactions  . Fosinopril Other (See Comments)    Other reaction(s): Other (See Comments) Hyperkalemia Reaction: Hyperkalemia     Family History  Problem Relation Age of Onset  . Diabetes Mother   . Liver cancer Father   . Liver disease Father   . Arthritis Sister     Social History Social History  Substance Use Topics  . Smoking status: Former Smoker    Packs/day: 0.50    Years: 46.00    Types: Cigarettes    Quit date: 06/11/1998  . Smokeless tobacco: Never Used  . Alcohol use No    Review of Systems  Constitutional: recent diagnosis of pneumonia, currently on antibiotics. Eyes: Negative for visual changes. ENT: Negative for sore throat. Cardiovascular: Negative for chest pain. Respiratory: Negative for shortness of breath. Gastrointestinal: Negative for abdominal pain, vomiting and diarrhea. Genitourinary: Negative for dysuria. Musculoskeletal: patient does report chronic low back pain. Skin: Negative for rash. Neurological: Negative for headache.  ____________________________________________   PHYSICAL EXAM:  VITAL SIGNS: ED Triage Vitals  Enc Vitals Group     BP 11/14/16 1233 133/63     Pulse Rate 11/14/16 1233 94     Resp 11/14/16 1233 (!) 21     Temp 11/14/16 1233 99.2 F (37.3 C)     Temp Source 11/14/16 1233 Oral     SpO2 11/14/16 1233 94 %     Weight 11/14/16 1234 206 lb (93.4 kg)     Height 11/14/16 1234  (1.676 m)     Head Circumference --      Peak Flow --      Pain Score --      Pain Loc --      Pain Edu? --      Excl. in GC? --      Constitutional: Alert and cooperative although a poor historian. Well appearing and in no distress. HEENT   Head: Normocephalic and atraumatic.      Eyes: Conjunctivae are normal.  Pupils equal and round.       Ears:         Nose: No congestion/rhinnorhea.   Mouth/Throat: Mucous membranes are moist.   Neck: No stridor. Cardiovascular/Chest: Normal rate, regular rhythm.  No murmurs, rubs, or gallops.  Respiratory: Normal respiratory effort without tachypnea nor retractions. Breath sounds are clear and equal bilaterally. No wheezes/rales/rhonchi. Gastrointestinal: Soft. No distention, no guarding, no rebound. Nontender.    Genitourinary/rectal:Deferred Musculoskeletal: Nontender with normal range of motion in all extremities. No joint effusions.  No lower extremity tenderness.  No edema. Neurologic:  somewhat slow to interpret and answer questions. For example when I ask him to raise both of his arms up into the air, he raises his left arm. However when I raise both of his arms into the air he is able to hold them up with no drift. Patient moves both of his feet and legs, but cannot hold them up on his own which the wife states is pretty typical. No slurred speech. No facial droop. denies sensory changes to the face and upper and lower extremities. Skin:  Skin is warm, dry and intact. No rash noted. Psychiatric: no agitation.   ____________________________________________  LABS (pertinent positives/negatives) I, Governor Rooks, MD the attending physician have reviewed the labs noted below.  Labs Reviewed  COMPREHENSIVE METABOLIC PANEL - Abnormal; Notable for the following:       Result Value   Glucose, Bld 206 (*)    BUN 55 (*)    Creatinine, Ser 2.39 (*)    Albumin 3.2 (*)    GFR calc non Af Amer 24 (*)    GFR calc Af Amer 27 (*)    All other components within normal limits  CBC WITH DIFFERENTIAL/PLATELET - Abnormal; Notable for the following:    RBC 2.85 (*)    Hemoglobin 9.4 (*)    HCT 27.0 (*)    RDW 16.4 (*)    Neutro Abs 7.8 (*)    Lymphs Abs 0.8 (*)    All other components within normal limits  PROTIME-INR - Abnormal; Notable for the following:     Prothrombin Time 17.5 (*)    All other components within normal limits  URINALYSIS, COMPLETE (UACMP) WITH MICROSCOPIC - Abnormal; Notable for the following:    Color, Urine YELLOW (*)    APPearance CLEAR (*)    Protein, ur 30 (*)    Squamous Epithelial / LPF 0-5 (*)    All other components within normal limits  CULTURE, BLOOD (ROUTINE X 2)  CULTURE, BLOOD (ROUTINE X 2)  LACTIC ACID, PLASMA    ____________________________________________    EKG I, Governor Rooks, MD, the attending physician have personally viewed and interpreted all ECGs.  94 bpm normal sinus rhythm. Narrow QRS. Normal axis. Normal ST and T-wave ____________________________________________  RADIOLOGY All Xrays were viewed by me.  Imaging interpreted by Radiologist, and I, Governor Rooks, MD the attending physician have reviewed the radiologist interpretation noted below.  chest x-ray portable:  chest x-ray portable:  CT head without contrast:  IMPRESSION: 1. No acute intracranial pathology. 2. Chronic microvascular disease and cerebral atrophy. __________________________________________  PROCEDURES  Procedure(s) performed: None  Critical Care performed: None  ____________________________________________  No current facility-administered medications on file prior to encounter.    Current Outpatient Prescriptions on File Prior to Encounter  Medication Sig Dispense Refill  . acetaminophen (TYLENOL) 325 MG tablet Take 650 mg by mouth every 6 (six) hours as needed for mild pain, moderate pain, fever or headache.    . albuterol (PROVENTIL HFA;VENTOLIN HFA) 108 (90 Base) MCG/ACT inhaler Inhale 2 puffs into the lungs every 4 (four) hours as needed for wheezing or shortness of breath.    Marland Kitchen amLODipine (NORVASC) 10 MG tablet Take 1 tablet (10  mg total) by mouth daily. 30 tablet 0  . benzonatate (TESSALON) 100 MG capsule Take 200 mg by mouth every 8 (eight) hours as needed for cough.    . bisacodyl (DULCOLAX) 5 MG EC  tablet Take 1 tablet (5 mg total) by mouth daily as needed for moderate constipation. 30 tablet 0  . cholecalciferol (VITAMIN D) 1000 UNITS tablet Take 1,000 Units by mouth daily.    Marland Kitchen docusate sodium (STOOL SOFTENER) 100 MG capsule Take 100 mg by mouth daily as needed for mild constipation or moderate constipation.     Marland Kitchen donepezil (ARICEPT) 10 MG tablet Take 10 mg by mouth at bedtime.     . DULoxetine (CYMBALTA) 60 MG capsule Take 60 mg by mouth daily.     . finasteride (PROSCAR) 5 MG tablet Take 5 mg by mouth daily. *staff : do not handle without gloves*    . furosemide (LASIX) 20 MG tablet Take 2 tablets (40 mg) by mouth daily each morning. Take an additional 1 tablet (20 mg) by mouth  6 hours later.    Marland Kitchen GLUCERNA (GLUCERNA) LIQD Take 1 Can by mouth daily.     Marland Kitchen HYDROcodone-acetaminophen (NORCO/VICODIN) 5-325 MG tablet Take 1 tablet by mouth every 6 (six) hours as needed for moderate pain. 20 tablet 0  . Insulin Glargine (TOUJEO SOLOSTAR) 300 UNIT/ML SOPN Inject 60 Units into the skin every morning. 8 am     . insulin regular (NOVOLIN R,HUMULIN R) 100 units/mL injection Inject 3-12 Units into the skin 3 (three) times daily before meals. If BS < 60 call NP/PA, If BS is 175 to 250 give 3 units, 251 to 325 give 6 units, 326 to 450, give 10 units, if BS > 450 give 12 units and call NP/PA, check bs ac&hs, if < 175 document 0 units    . memantine (NAMENDA XR) 28 MG CP24 24 hr capsule Take 28 mg by mouth daily.     . metoprolol tartrate (LOPRESSOR) 25 MG tablet Take 1 tablet (25 mg total) by mouth 2 (two) times daily.    . pantoprazole (PROTONIX) 20 MG tablet Take 20 mg by mouth daily.    . pregabalin (LYRICA) 75 MG capsule Take 1 capsule (75 mg total) by mouth 2 (two) times daily. 60 capsule 4  . rivaroxaban (XARELTO) 20 MG TABS tablet Take 1 tablet by mouth daily.     . sacubitril-valsartan (ENTRESTO) 49-51 MG Take 1 tablet by mouth 2 (two) times daily.    Marland Kitchen senna (SENOKOT) 8.6 MG TABS tablet Take 1  tablet (8.6 mg total) by mouth daily. 100 each 0  . tamsulosin (FLOMAX) 0.4 MG CAPS capsule Take 0.4 mg by mouth at bedtime.    . timolol (TIMOPTIC) 0.5 % ophthalmic solution Place 1 drop into both eyes 2 (two) times daily.     . Wound Dressings (TRIAD HYDROPHILIC WOUND DRESSI) PSTE Apply cream topically to macerated areas of buttocks daily and prn      ____________________________________________  ED COURSE / ASSESSMENT AND PLAN  Pertinent labs & imaging results that were available during my care of the patient were reviewed by me and considered in my medical decision making (see chart for details).    Mr. Danzer is alert and interactive although he is a little slow to answer some questions and follow some commands he does participate. Wife believes he is at his mental status baseline and physical baseline. He is weak in both legs and can't hold them up against  gravity rapid today on his own, but he can move them on the bed. He is reporting no sensory changes. He does not have any focal one sided weakness. I don't see any signs of acute stroke. Report from the nursing home nurse practitioner was that perhaps he is having some one-sided left upper extremity weakness, and for this I am going to go ahead and obtain a head CT although I suspect acute finding is unlikely for all. He is also already on Xarelto and so given his debilitated condition overall, and clinical picture not extremely reflective by history or clinical exam now for acute stroke, I think it's okay to go ahead and discharge him home.  Additional laboratory and metabolic workup is overall reassuring for no certain cause to make him weak or stiff or altered and family believes he is at baseline now.  UA reassuring.  Discussed with wife, will dishcarge home, will need ambulance transport.  DIFFERENTIAL DIAGNOSIS: Differential diagnosis includes, but is not limited to, alcohol, illicit or prescription medications, or other toxic  ingestion; intracranial pathology such as stroke or intracerebral hemorrhage; fever or infectious causes including sepsis; hypoxemia and/or hypercarbia; uremia; trauma; endocrine related disorders such as diabetes, hypoglycemia, and thyroid-related diseases; hypertensive encephalopathy; etc.  CONSULTATIONS:  I spoke by phone with the patient's own nurse practitioner from the nursing home.   Patient / Family / Caregiver informed of clinical course, medical decision-making process, and agree with plan.   I discussed return precautions, follow-up instructions, and discharge instructions with patient and/or family.  Discharge Instructions : You were evaluated for possible altered mental status and generalized weakness, and although no certain cause was found, your exam and evaluation are overall reassuring in the emergency department today.  return to the emergency departmentimmediately for any new or worsening condition including altered mental status, fever, trouble breathing, or any other symptoms concerning to you.  ___________________________________________   FINAL CLINICAL IMPRESSION(S) / ED DIAGNOSES   Final diagnoses:  Generalized weakness              Note: This dictation was prepared with Dragon dictation. Any transcriptional errors that result from this process are unintentional    Governor Rooks, MD 11/14/16 1459    Governor Rooks, MD 11/14/16 [id number]

## 2016-11-18 ENCOUNTER — Non-Acute Institutional Stay (SKILLED_NURSING_FACILITY): Payer: Medicare Other | Admitting: Gerontology

## 2016-11-18 DIAGNOSIS — J069 Acute upper respiratory infection, unspecified: Secondary | ICD-10-CM

## 2016-11-19 LAB — CULTURE, BLOOD (ROUTINE X 2)
Culture: NO GROWTH
Culture: NO GROWTH
SPECIAL REQUESTS: ADEQUATE

## 2016-11-19 NOTE — Progress Notes (Deleted)
[facility]:  [facility] Room Number: [phone] Place of Service:  SNF [phone]) Provider:  Lorenso Quarry, NP-C  Marisue Ivan, MD  Patient Care Team: Marisue Ivan, MD as PCP - General (Family Medicine)  Extended Emergency Contact Information Primary Emergency Contact: [name] Address: [phone]          [place], [place] [address] Darden Amber of [place] Home Phone: [phone] Relation: Spouse Secondary Emergency Contact: Christoper Fabian [place] of [place] Mobile Phone: [phone] Relation: Relative  Code Status:  FULL Goals of care: Advanced Directive information Advanced Directives 11/18/2016  Does Patient Have a Medical Advance Directive? No  Type of Advance Directive -  Does patient want to make changes to medical advance directive? -  Copy of Healthcare Power of Attorney in Chart? -  Would patient like information on creating a medical advance directive? -     Chief Complaint  Patient presents with  . Acute Visit    Follow up on congestion/cough    HPI:  Pt is a 83 y.o. male seen today for an acute visit for    Past Medical History:  Diagnosis Date  . Arthritis   . Background retinopathy   . Cervicalgia   . Chronic kidney disease    follow up with a Dr.   . Chronic systolic CHF (congestive heart failure) (HCC)   . Congenital heart failure (HCC)   . Degeneration of lumbar or lumbosacral intervertebral disc   . Dementia   . Diabetes mellitus type 2, uncomplicated (HCC) 1996   on insulin since 2011, last eye exam with Dr. Inez Pilgrim 07/2013  . Diabetes mellitus without complication (HCC)   . DVT (deep venous thrombosis) (HCC)   . Essential hypertension    unspecified  . Generalized weakness   . Glaucoma   . Hypercholesteremia   . Hypertension   . Long-term insulin use (HCC)   . Moderate dementia   . Peripheral polyneuropathy   . Peripheral vascular disease (HCC)   . Pneumonia, unspecified organism  04/01/2014  . Pure hypercholesterolemia   . Spinal stenosis   . Spinal stenosis, lumbar region without neurogenic claudication    Past Surgical History:  Procedure Laterality Date  . ANGIOPLASTY / STENTING FEMORAL  1999  . ANTERIOR FUSION CERVICAL SPINE  07/2009  . c-spine fusion N/A   . COLONOSCOPY  05/23/2004   Dr. Maryruth Bun, normal, PH colon polpys    Allergies  Allergen Reactions  . Fosinopril Other (See Comments)    Other reaction(s): Other (See Comments) Hyperkalemia Reaction: Hyperkalemia     Allergies as of 11/18/2016      Reactions   Fosinopril Other (See Comments)   Other reaction(s): Other (See Comments) Hyperkalemia Reaction: Hyperkalemia       Medication List       Accurate as of 11/18/16 11:59 PM. Always use your most recent med list.          acetaminophen 325 MG tablet Commonly known as:  TYLENOL Take 650 mg by mouth every 6 (six) hours as needed for mild pain, moderate pain, fever or headache.   albuterol 108 (90 Base) MCG/ACT inhaler Commonly known as:  PROVENTIL HFA;VENTOLIN HFA Inhale 2 puffs into the lungs every 4 (four) hours as needed for wheezing or shortness of breath.   amLODipine 10 MG tablet Commonly known as:  NORVASC Take 1 tablet (10 mg total) by mouth daily.   benzonatate 100 MG capsule Commonly known as:  TESSALON Take 200  mg by mouth every 8 (eight) hours as needed for cough.   bisacodyl 5 MG EC tablet Commonly known as:  DULCOLAX Take 1 tablet (5 mg total) by mouth daily as needed for moderate constipation.   cholecalciferol 1000 units tablet Commonly known as:  VITAMIN D Take 1,000 Units by mouth daily.   donepezil 10 MG tablet Commonly known as:  ARICEPT Take 10 mg by mouth at bedtime.   DULoxetine 60 MG capsule Commonly known as:  CYMBALTA Take 60 mg by mouth daily.   ENTRESTO 49-51 MG Generic drug:  sacubitril-valsartan Take 1 tablet by mouth 2 (two) times daily.   finasteride 5 MG tablet Commonly known as:   PROSCAR Take 5 mg by mouth daily. *staff : do not handle without gloves*   furosemide 20 MG tablet Commonly known as:  LASIX Take 2 tablets (40 mg) by mouth daily each morning. Take an additional 1 tablet (20 mg) by mouth  6 hours later.   GLUCERNA Liqd Take 1 Can by mouth daily.   HYDROcodone-acetaminophen 5-325 MG tablet Commonly known as:  NORCO/VICODIN Take 1 tablet by mouth every 6 (six) hours as needed for moderate pain.   insulin regular 100 units/mL injection Commonly known as:  NOVOLIN R,HUMULIN R Inject 3-12 Units into the skin 3 (three) times daily before meals. If BS < 60 call NP/PA, If BS is 175 to 250 give 3 units, 251 to 325 give 6 units, 326 to 450, give 10 units, if BS > 450 give 12 units and call NP/PA, check bs ac&hs, if < 175 document 0 units   metoprolol tartrate 25 MG tablet Commonly known as:  LOPRESSOR Take 1 tablet (25 mg total) by mouth 2 (two) times daily.   NAMENDA XR 28 MG Cp24 24 hr capsule Generic drug:  memantine Take 28 mg by mouth daily.   pantoprazole 20 MG tablet Commonly known as:  PROTONIX Take 20 mg by mouth daily.   pregabalin 75 MG capsule Commonly known as:  LYRICA Take 1 capsule (75 mg total) by mouth 2 (two) times daily.   senna 8.6 MG Tabs tablet Commonly known as:  SENOKOT Take 1 tablet (8.6 mg total) by mouth daily.   STOOL SOFTENER 100 MG capsule Generic drug:  docusate sodium Take 100 mg by mouth daily as needed for mild constipation or moderate constipation.   tamsulosin 0.4 MG Caps capsule Commonly known as:  FLOMAX Take 0.4 mg by mouth at bedtime.   timolol 0.5 % ophthalmic solution Commonly known as:  TIMOPTIC Place 1 drop into both eyes 2 (two) times daily.   TOUJEO SOLOSTAR 300 UNIT/ML Sopn Generic drug:  Insulin Glargine Inject 60 Units into the skin every morning. 8 am   Triad Hydrophilic Wound Dressi Pste Apply cream topically to macerated areas of buttocks daily and prn   XARELTO 20 MG Tabs  tablet Generic drug:  rivaroxaban Take 1 tablet by mouth daily.       Review of Systems  Immunization History  Administered Date(s) Administered  . Influenza Split 12/04/2014  . Influenza,inj,Quad PF,6+ Mos 11/11/2015  . Influenza-Unspecified 11/17/2011, 12/04/2014, 10/30/2016  . PPD Test 02/25/2014, 03/11/2014, 04/08/2016  . Pneumococcal Polysaccharide-23 02/11/2008   Pertinent  Health Maintenance Due  Topic Date Due  . FOOT EXAM  07/16/1941  . OPHTHALMOLOGY EXAM  07/16/1941  . URINE MICROALBUMIN  07[birth year]16/1941  . PNA vac Low Risk Adult (2 of 2 - PCV13) 02/10/2009  . HEMOGLOBIN A1C  03/08/2015  . INFLUENZA  VACCINE  Completed   No flowsheet data found. Functional Status Survey:    Vitals:   11/18/16 1211  BP: (!) 107/56  Pulse: 72  Resp: 20  Temp: 98.4 F (36.9 C)  SpO2: 97%  Weight: 182 lb 4.8 oz (82.7 kg)  Height:  (1.676 m)   Body mass index is 29.42 kg/m. Physical Exam  Labs reviewed:  Recent Labs  07/18/16 1100 10/29/16 1440 10/30/16 0015 11/14/16 1236  NA 138 139 139 138  K 4.2 4.9 4.9 5.0  CL 102 107 107 101  CO2 GLUCOSE 174* 347* 321* 206*  BUN 35* 40* 45* 55*  CREATININE 1.95* 2.29* 2.35* 2.39*  CALCIUM 9.0 8.9 8.8* 9.1  MG 2.2  --   --   --     Recent Labs  07/18/16 1100 10/29/16 1440 11/14/16 1236  AST 46* 23 21  ALT 80* 15* 17  ALKPHOS 83 61 76  BILITOT 0.6 0.7 0.7  PROT 7.5 7.0 7.6  ALBUMIN 3.2* 3.0* 3.2*    Recent Labs  07/18/16 1100 10/29/16 1440 11/14/16 1236  WBC 4.7 5.8 9.6  NEUTROABS 3.0 3.9 7.8*  HGB 10.3* 9.6* 9.4*  HCT 30.5* 28.2* 27.0*  MCV 93.9 93.1 94.9  PLT 219 215 225   Lab Results  Component Value Date   TSH 4.410 07/18/2016   Lab Results  Component Value Date   HGBA1C 9.5 (H) 09/05/2014   Lab Results  Component Value Date   CHOL 177 07/18/2016   HDL 34 (L) 07/18/2016   LDLCALC 122 (H) 07/18/2016   TRIG 105 07/18/2016   CHOLHDL 5.2 07/18/2016    Significant Diagnostic  Results in last 30 days:  Dg Chest 1 View  Result Date: 10/29/2016 CLINICAL DATA:  Hyperglycemia and hypotension today. Vomiting today. EXAM: CHEST 1 VIEW COMPARISON:  Single-view of the chest 04/05/2016 and PA and lateral chest 11/13/2015. FINDINGS: The lungs are clear. Heart size is upper normal. No pneumothorax or pleural fluid. Aortic atherosclerosis is noted. IMPRESSION: No acute disease. Electronically Signed   By: Drusilla Kanner M.D.   On: 10/29/2016 15:28   Ct Head Wo Contrast  Result Date: 11/14/2016 CLINICAL DATA:  Wet cough, worsening AMS from baseline today. Nurse states patient ate around 1000, possible aspiration. EXAM: CT HEAD WITHOUT CONTRAST TECHNIQUE: Contiguous axial images were obtained from the base of the skull through the vertex without intravenous contrast. COMPARISON:  04/05/2016 FINDINGS: Brain: No evidence of acute infarction, hemorrhage, extra-axial collection, ventriculomegaly, or mass effect. Generalized cerebral atrophy. Periventricular white matter low attenuation likely secondary to microangiopathy. Vascular: Cerebrovascular atherosclerotic calcifications are noted. Skull: Negative for fracture or focal lesion. Sinuses/Orbits: Visualized portions of the orbits are unremarkable. Visualized portions of the paranasal sinuses and mastoid air cells are unremarkable. Other: None. IMPRESSION: 1. No acute intracranial pathology. 2. Chronic microvascular disease and cerebral atrophy. Electronically Signed   By: Elige Ko   On: 11/14/2016 14:52   Dg Chest Portable 1 View  Result Date: 11/14/2016 CLINICAL DATA:  Cough. EXAM: PORTABLE CHEST 1 VIEW COMPARISON:  One-view chest x-ray 10/29/2016 FINDINGS: The heart size is normal. Lung volumes are low. Bibasilar airspace disease likely reflects atelectasis. There is no edema or effusion. The visualized soft tissues and bony thorax are otherwise unremarkable. IMPRESSION: 1. Low lung volumes and mild bibasilar airspace disease, likely  atelectasis. Electronically Signed   By: Marin Roberts M.D.   On: 11/14/2016 12:59    Assessment/Plan There are no  diagnoses linked to this encounter.    Family/ staff Communication: ***  Total Time:  Documentation:  Face to Face:  Family/Phone:   Labs/tests ordered:  ***  Medication list reviewed and assessed for continued appropriateness.  Brynda Rim, NP-C Geriatrics [facility] [phone], [place] [address] Cell Phone (Mon-Fri 8am-5pm):  [phone] On Call:  [phone] & follow prompts after 5pm & weekends Office Phone:  [phone] Office Fax:  [phone]

## 2016-11-25 ENCOUNTER — Encounter: Payer: Self-pay | Admitting: Gerontology

## 2016-11-25 NOTE — Progress Notes (Signed)
Opened in error; Disregard.

## 2016-11-25 NOTE — Progress Notes (Signed)
Location:   [facility] Room Number: [phone] Place of Service:  SNF [phone]) Provider:  Lorenso Quarry, NP-C  Marisue Ivan, MD  Patient Care Team: Marisue Ivan, MD as PCP - General (Family Medicine)  Extended Emergency Contact Information Primary Emergency Contact: [name] Address: [phone]          [place], [place] [address] Darden [place] of [place] Home Phone: [phone] Relation: Spouse Secondary Emergency Contact: Christoper Fabian [place] Mobile Phone: [phone] Relation: Relative  Code Status:  FULL Goals of care: Advanced Directive information Advanced Directives 11/25/2016  Does Patient Have a Medical Advance Directive? No  Type of Advance Directive -  Does patient want to make changes to medical advance directive? -  Copy of Healthcare Power of Attorney in Chart? -  Would patient like information on creating a medical advance directive? -     Chief Complaint  Patient presents with  . Acute Visit    Follow up on congestion/cough    HPI:  Pt is a 83 y.o. male seen today for an acute visit for viral URI.  Asked to see patient by staff due to increased cough, congestion.  Patient has been afebrile.  Nonproductive cough.  Patient has dementia, was able to answer simple questions.  Patient denies n/v/d/f/c/cp/sob/ha/abd pain/dizziness.  Will obtain chest x-ray to rule out aspiration pneumonia due to patient's dementia and generalized weakness.  Otherwise, vital signs stable.  No other complaints.   Past Medical History:  Diagnosis Date  . Arthritis   . Background retinopathy   . Cervicalgia   . Chronic kidney disease    follow up with a Dr.   . Chronic systolic CHF (congestive heart failure) (HCC)   . Congenital heart failure (HCC)   . Degeneration of lumbar or lumbosacral intervertebral disc   . Dementia   . Diabetes mellitus type 2, uncomplicated (HCC) 1996   on insulin since 2011, last eye exam  with Dr. Inez Pilgrim 07/2013  . Diabetes mellitus without complication (HCC)   . DVT (deep venous thrombosis) (HCC)   . Essential hypertension    unspecified  . Generalized weakness   . Glaucoma   . Hypercholesteremia   . Hypertension   . Long-term insulin use (HCC)   . Moderate dementia   . Peripheral polyneuropathy   . Peripheral vascular disease (HCC)   . Pneumonia, unspecified organism 04/01/2014  . Pure hypercholesterolemia   . Spinal stenosis   . Spinal stenosis, lumbar region without neurogenic claudication    Past Surgical History:  Procedure Laterality Date  . ANGIOPLASTY / STENTING FEMORAL  1999  . ANTERIOR FUSION CERVICAL SPINE  07/2009  . c-spine fusion N/A   . COLONOSCOPY  05/23/2004   Dr. Maryruth Bun, normal, PH colon polpys    Allergies  Allergen Reactions  . Fosinopril Other (See Comments)    Other reaction(s): Other (See Comments) Hyperkalemia Reaction: Hyperkalemia     Allergies as of 11/18/2016      Reactions   Fosinopril Other (See Comments)   Other reaction(s): Other (See Comments) Hyperkalemia Reaction: Hyperkalemia       Medication List       Accurate as of 11/18/16 11:59 PM. Always use your most recent med list.          acetaminophen 325 MG tablet Commonly known as:  TYLENOL Take 650 mg by mouth every 6 (six) hours as needed for mild pain, moderate pain, fever or headache.   albuterol  108 (90 Base) MCG/ACT inhaler Commonly known as:  PROVENTIL HFA;VENTOLIN HFA Inhale 2 puffs into the lungs every 4 (four) hours as needed for wheezing or shortness of breath.   amLODipine 10 MG tablet Commonly known as:  NORVASC Take 1 tablet (10 mg total) by mouth daily.   benzonatate 100 MG capsule Commonly known as:  TESSALON Take 200 mg by mouth every 8 (eight) hours as needed for cough.   bisacodyl 5 MG EC tablet Commonly known as:  DULCOLAX Take 1 tablet (5 mg total) by mouth daily as needed for moderate constipation.   cholecalciferol 1000  units tablet Commonly known as:  VITAMIN D Take 1,000 Units by mouth daily.   donepezil 10 MG tablet Commonly known as:  ARICEPT Take 10 mg by mouth at bedtime.   DULoxetine 60 MG capsule Commonly known as:  CYMBALTA Take 60 mg by mouth daily.   ENTRESTO 49-51 MG Generic drug:  sacubitril-valsartan Take 1 tablet by mouth 2 (two) times daily.   finasteride 5 MG tablet Commonly known as:  PROSCAR Take 5 mg by mouth daily. *staff : do not handle without gloves*   furosemide 20 MG tablet Commonly known as:  LASIX Take 2 tablets (40 mg) by mouth daily each morning. Take an additional 1 tablet (20 mg) by mouth  6 hours later.   GLUCERNA Liqd Take 1 Can by mouth daily.   HYDROcodone-acetaminophen 5-325 MG tablet Commonly known as:  NORCO/VICODIN Take 1 tablet by mouth every 6 (six) hours as needed for moderate pain.   insulin regular 100 units/mL injection Commonly known as:  NOVOLIN R,HUMULIN R Inject 3-12 Units into the skin 3 (three) times daily before meals. If BS < 60 call NP/PA, If BS is 175 to 250 give 3 units, 251 to 325 give 6 units, 326 to 450, give 10 units, if BS > 450 give 12 units and call NP/PA, check bs ac&hs, if < 175 document 0 units   metoprolol tartrate 25 MG tablet Commonly known as:  LOPRESSOR Take 1 tablet (25 mg total) by mouth 2 (two) times daily.   NAMENDA XR 28 MG Cp24 24 hr capsule Generic drug:  memantine Take 28 mg by mouth daily.   pantoprazole 20 MG tablet Commonly known as:  PROTONIX Take 20 mg by mouth daily.   pregabalin 75 MG capsule Commonly known as:  LYRICA Take 1 capsule (75 mg total) by mouth 2 (two) times daily.   senna 8.6 MG Tabs tablet Commonly known as:  SENOKOT Take 1 tablet (8.6 mg total) by mouth daily.   STOOL SOFTENER 100 MG capsule Generic drug:  docusate sodium Take 100 mg by mouth daily as needed for mild constipation or moderate constipation.   tamsulosin 0.4 MG Caps capsule Commonly known as:  FLOMAX Take  0.4 mg by mouth at bedtime.   timolol 0.5 % ophthalmic solution Commonly known as:  TIMOPTIC Place 1 drop into both eyes 2 (two) times daily.   TOUJEO SOLOSTAR 300 UNIT/ML Sopn Generic drug:  Insulin Glargine Inject 60 Units into the skin every morning. 8 am   Triad Hydrophilic Wound Dressi Pste Apply cream topically to macerated areas of buttocks daily and prn   XARELTO 20 MG Tabs tablet Generic drug:  rivaroxaban Take 1 tablet by mouth daily.       Review of Systems  Unable to perform ROS: Dementia  Constitutional: Negative for activity change, appetite change, chills, diaphoresis and fever.  HENT: Negative for congestion, sneezing,  sore throat, trouble swallowing and voice change.   Eyes: Negative for pain, redness and visual disturbance.  Respiratory: Positive for cough. Negative for apnea, choking, chest tightness, shortness of breath and wheezing.   Cardiovascular: Negative for chest pain, palpitations and leg swelling.  Gastrointestinal: Negative for abdominal distention, abdominal pain, constipation, diarrhea and nausea.  Genitourinary: Negative for difficulty urinating, dysuria, frequency and urgency.  Musculoskeletal: Positive for arthralgias (typical arthritis). Negative for back pain, gait problem and myalgias.  Skin: Negative for color change, pallor, rash and wound.  Neurological: Negative for dizziness, tremors, syncope, speech difficulty, weakness, numbness and headaches.  Psychiatric/Behavioral: Negative for agitation and behavioral problems.  All other systems reviewed and are negative.   Immunization History  Administered Date(s) Administered  . Influenza Split 12/04/2014  . Influenza,inj,Quad PF,6+ Mos 11/11/2015  . Influenza-Unspecified 11/17/2011, 12/04/2014, 10/30/2016  . PPD Test 02/25/2014, 03/11/2014, 04/08/2016  . Pneumococcal Polysaccharide-23 02/11/2008   Pertinent  Health Maintenance Due  Topic Date Due  . FOOT EXAM  07/16/1941  .  OPHTHALMOLOGY EXAM  07/16/1941  . URINE MICROALBUMIN  07[birth year]16/1941  . PNA vac Low Risk Adult (2 of 2 - PCV13) 02/10/2009  . HEMOGLOBIN A1C  03/08/2015  . INFLUENZA VACCINE  Completed   No flowsheet data found. Functional Status Survey:    Vitals:   11/18/16 0853  BP: (!) 107/56  Pulse: 72  Resp: 20  Temp: 98.4 F (36.9 C)  SpO2: 97%  Weight: 182 lb 5.1 oz (82.7 kg)  Height:  (1.676 m)   Body mass index is 29.43 kg/m. Physical Exam  Constitutional: He is oriented to person, place, and time. Vital signs are normal. He appears well-developed and well-nourished. He is active and cooperative. He does not appear ill. No distress. Nasal cannula in place.  HENT:  Head: Normocephalic and atraumatic.  Mouth/Throat: Uvula is midline, oropharynx is clear and moist and mucous membranes are normal. Mucous membranes are not pale, not dry and not cyanotic.  Eyes: Pupils are equal, round, and reactive to light. Conjunctivae, EOM and lids are normal.  Neck: Trachea normal, normal range of motion and full passive range of motion without pain. Neck supple. No JVD present. No tracheal deviation, no edema and no erythema present. No thyromegaly present.  Cardiovascular: Normal rate, regular rhythm, normal heart sounds, intact distal pulses and normal pulses.  Exam reveals no gallop, no distant heart sounds and no friction rub.   No murmur heard. Pulses:      Dorsalis pedis pulses are 2+ on the right side, and 2+ on the left side.  2+ chronic bilateral lower extremity edema  Pulmonary/Chest: Effort normal. No accessory muscle usage. No respiratory distress. He has decreased breath sounds in the right lower field and the left lower field. He has no wheezes. He has no rhonchi. He has no rales. He exhibits no tenderness.  Abdominal: Normal appearance and bowel sounds are normal. He exhibits no distension and no ascites. There is no tenderness.  Musculoskeletal: Normal range of motion. He exhibits no  edema or tenderness.  Expected osteoarthritis, stiffness  Neurological: He is alert and oriented to person, place, and time. He has normal strength.  Skin: Skin is warm, dry and intact. He is not diaphoretic. No cyanosis. No pallor. Nails show no clubbing.  Psychiatric: He has a normal mood and affect. His speech is normal and behavior is normal. Judgment and thought content normal. Cognition and memory are normal.  Nursing note and vitals reviewed.   Labs  reviewed:  Recent Labs  07/18/16 1100 10/29/16 1440 10/30/16 0015 11/14/16 1236  NA 138 139 139 138  K 4.2 4.9 4.9 5.0  CL 102 107 107 101  CO2 GLUCOSE 174* 347* 321* 206*  BUN 35* 40* 45* 55*  CREATININE 1.95* 2.29* 2.35* 2.39*  CALCIUM 9.0 8.9 8.8* 9.1  MG 2.2  --   --   --     Recent Labs  07/18/16 1100 10/29/16 1440 11/14/16 1236  AST 46* 23 21  ALT 80* 15* 17  ALKPHOS 83 61 76  BILITOT 0.6 0.7 0.7  PROT 7.5 7.0 7.6  ALBUMIN 3.2* 3.0* 3.2*    Recent Labs  07/18/16 1100 10/29/16 1440 11/14/16 1236  WBC 4.7 5.8 9.6  NEUTROABS 3.0 3.9 7.8*  HGB 10.3* 9.6* 9.4*  HCT 30.5* 28.2* 27.0*  MCV 93.9 93.1 94.9  PLT 219 215 225   Lab Results  Component Value Date   TSH 4.410 07/18/2016   Lab Results  Component Value Date   HGBA1C 9.5 (H) 09/05/2014   Lab Results  Component Value Date   CHOL 177 07/18/2016   HDL 34 (L) 07/18/2016   LDLCALC 122 (H) 07/18/2016   TRIG 105 07/18/2016   CHOLHDL 5.2 07/18/2016    Significant Diagnostic Results in last 30 days:  Dg Chest 1 View  Result Date: 10/29/2016 CLINICAL DATA:  Hyperglycemia and hypotension today. Vomiting today. EXAM: CHEST 1 VIEW COMPARISON:  Single-view of the chest 04/05/2016 and PA and lateral chest 11/13/2015. FINDINGS: The lungs are clear. Heart size is upper normal. No pneumothorax or pleural fluid. Aortic atherosclerosis is noted. IMPRESSION: No acute disease. Electronically Signed   By: Drusilla Kanner M.D.   On: 10/29/2016  15:28   Ct Head Wo Contrast  Result Date: 11/14/2016 CLINICAL DATA:  Wet cough, worsening AMS from baseline today. Nurse states patient ate around 1000, possible aspiration. EXAM: CT HEAD WITHOUT CONTRAST TECHNIQUE: Contiguous axial images were obtained from the base of the skull through the vertex without intravenous contrast. COMPARISON:  04/05/2016 FINDINGS: Brain: No evidence of acute infarction, hemorrhage, extra-axial collection, ventriculomegaly, or mass effect. Generalized cerebral atrophy. Periventricular white matter low attenuation likely secondary to microangiopathy. Vascular: Cerebrovascular atherosclerotic calcifications are noted. Skull: Negative for fracture or focal lesion. Sinuses/Orbits: Visualized portions of the orbits are unremarkable. Visualized portions of the paranasal sinuses and mastoid air cells are unremarkable. Other: None. IMPRESSION: 1. No acute intracranial pathology. 2. Chronic microvascular disease and cerebral atrophy. Electronically Signed   By: Elige Ko   On: 11/14/2016 14:52   Dg Chest Portable 1 View  Result Date: 11/14/2016 CLINICAL DATA:  Cough. EXAM: PORTABLE CHEST 1 VIEW COMPARISON:  One-view chest x-ray 10/29/2016 FINDINGS: The heart size is normal. Lung volumes are low. Bibasilar airspace disease likely reflects atelectasis. There is no edema or effusion. The visualized soft tissues and bony thorax are otherwise unremarkable. IMPRESSION: 1. Low lung volumes and mild bibasilar airspace disease, likely atelectasis. Electronically Signed   By: Marin Roberts M.D.   On: 11/14/2016 12:59    Assessment/Plan 1.  Viral URI  2 view chest x-ray  Guaifenesin 10 mL p.o. every 4 hours as needed cough/congestion  Guaifenesin 10 mls p.o. 3 times daily scheduled   Family/ staff Communication:   Total Time:  Documentation:  Face to Face:  Family/Phone:   Labs/tests ordered: 2 view chest x-ray  Medication list reviewed and assessed for continued  appropriateness.  Brynda Rim, NP-C  [facility] [address], [place] [address] Cell Phone (Mon-Fri 8am-5pm):  [phone] On Call:  [phone] & follow prompts after 5pm & weekends Office Phone:  [phone] Office Fax:  [phone]

## 2016-12-11 ENCOUNTER — Encounter
Admission: RE | Admit: 2016-12-11 | Discharge: 2016-12-11 | Disposition: A | Discharge status: Home / Self Care | Payer: Medicare Other | Source: Ambulatory Visit | Attending: Internal Medicine | Admitting: Internal Medicine

## 2016-12-11 ENCOUNTER — Non-Acute Institutional Stay (SKILLED_NURSING_FACILITY): Payer: Medicare Other | Admitting: Gerontology

## 2016-12-11 ENCOUNTER — Encounter: Payer: Self-pay | Admitting: Gerontology

## 2016-12-11 DIAGNOSIS — N400 Enlarged prostate without lower urinary tract symptoms: Secondary | ICD-10-CM

## 2016-12-11 DIAGNOSIS — H409 Unspecified glaucoma: Secondary | ICD-10-CM | POA: Diagnosis not present

## 2016-12-11 DIAGNOSIS — E1122 Type 2 diabetes mellitus with diabetic chronic kidney disease: Secondary | ICD-10-CM | POA: Diagnosis not present

## 2016-12-11 DIAGNOSIS — I5022 Chronic systolic (congestive) heart failure: Secondary | ICD-10-CM | POA: Diagnosis not present

## 2016-12-11 DIAGNOSIS — G301 Alzheimer's disease with late onset: Secondary | ICD-10-CM | POA: Diagnosis not present

## 2016-12-11 DIAGNOSIS — M5136 Other intervertebral disc degeneration, lumbar region: Secondary | ICD-10-CM

## 2016-12-11 DIAGNOSIS — L308 Other specified dermatitis: Secondary | ICD-10-CM

## 2016-12-11 DIAGNOSIS — R1311 Dysphagia, oral phase: Secondary | ICD-10-CM

## 2016-12-11 DIAGNOSIS — F329 Major depressive disorder, single episode, unspecified: Secondary | ICD-10-CM

## 2016-12-11 DIAGNOSIS — E11319 Type 2 diabetes mellitus with unspecified diabetic retinopathy without macular edema: Secondary | ICD-10-CM | POA: Diagnosis not present

## 2016-12-11 DIAGNOSIS — E1139 Type 2 diabetes mellitus with other diabetic ophthalmic complication: Secondary | ICD-10-CM

## 2016-12-11 DIAGNOSIS — I10 Essential (primary) hypertension: Secondary | ICD-10-CM

## 2016-12-11 DIAGNOSIS — I82502 Chronic embolism and thrombosis of unspecified deep veins of left lower extremity: Secondary | ICD-10-CM | POA: Diagnosis not present

## 2016-12-11 DIAGNOSIS — E1142 Type 2 diabetes mellitus with diabetic polyneuropathy: Secondary | ICD-10-CM | POA: Diagnosis not present

## 2016-12-11 DIAGNOSIS — Z794 Long term (current) use of insulin: Secondary | ICD-10-CM

## 2016-12-11 DIAGNOSIS — F0280 Dementia in other diseases classified elsewhere without behavioral disturbance: Secondary | ICD-10-CM | POA: Diagnosis not present

## 2016-12-11 DIAGNOSIS — E7800 Pure hypercholesterolemia, unspecified: Secondary | ICD-10-CM

## 2016-12-11 DIAGNOSIS — I110 Hypertensive heart disease with heart failure: Secondary | ICD-10-CM

## 2016-12-11 DIAGNOSIS — K219 Gastro-esophageal reflux disease without esophagitis: Secondary | ICD-10-CM

## 2016-12-26 NOTE — Progress Notes (Signed)
Location:   [facility] Room Number: [phone] Place of Service:  SNF [phone]) Provider:  Lorenso Quarry, NP-C  Marisue Ivan, MD  Patient Care Team: Marisue Ivan, MD as PCP - General (Family Medicine)  Extended Emergency Contact Information Primary Emergency Contact: [name] Address: [phone]          [place], [place] [address] [place] Home Phone: [phone] Relation: Spouse Secondary Emergency Contact: Christoper Fabian [place] of [place] Mobile Phone: [phone] Relation: Relative  Code Status:  FULL Goals of care: Advanced Directive information Advanced Directives 12/11/2016  Does Patient Have a Medical Advance Directive? No  Type of Advance Directive -  Does patient want to make changes to medical advance directive? -  Copy of Healthcare Power of Attorney in Chart? -  Would patient like information on creating a medical advance directive? -     Chief Complaint  Patient presents with  . Medical Management of Chronic Issues    Routine Visit    HPI:  Pt is a 83 y.o. male seen today for medical management of chronic diseases.  Patient has had several episodes recently of pneumonia, altered mental status having to visit the ED, and URI.  He seems to be having a slow decline.  Patient has CHF with chronic bilateral lower extremity edema.  Patient has a spent a lot of time in bed and does not socialize with others often.  However, he is getting out of the room more as of late due to staff encouragement.  No falls this past month.  No complaint of pain.  Depression is well managed.  Good appetite, has regular BMs.  Patient has dementia making ROS limited.  Vital signs stable.  No other complaints.  Past Medical History:  Diagnosis Date  . Arthritis   . Background retinopathy   . Cervicalgia   . Chronic kidney disease    follow up with a Dr.   . Chronic systolic CHF (congestive heart failure) (HCC)   .  Congenital heart failure (HCC)   . Degeneration of lumbar or lumbosacral intervertebral disc   . Dementia   . Diabetes mellitus type 2, uncomplicated (HCC) 1996   on insulin since 2011, last eye exam with Dr. Inez Pilgrim 07/2013  . Diabetes mellitus without complication (HCC)   . DVT (deep venous thrombosis) (HCC)   . Essential hypertension    unspecified  . Generalized weakness   . Glaucoma   . Hypercholesteremia   . Hypertension   . Long-term insulin use (HCC)   . Moderate dementia   . Peripheral polyneuropathy   . Peripheral vascular disease (HCC)   . Pneumonia, unspecified organism 04/01/2014  . Pure hypercholesterolemia   . Spinal stenosis   . Spinal stenosis, lumbar region without neurogenic claudication    Past Surgical History:  Procedure Laterality Date  . ANGIOPLASTY / STENTING FEMORAL  1999  . ANTERIOR FUSION CERVICAL SPINE  07/2009  . c-spine fusion N/A   . COLONOSCOPY  05/23/2004   Dr. Maryruth Bun, normal, PH colon polpys    Allergies  Allergen Reactions  . Fosinopril Other (See Comments)    Other reaction(s): Other (See Comments) Hyperkalemia Reaction: Hyperkalemia     Allergies as of 12/11/2016      Reactions   Fosinopril Other (See Comments)   Other reaction(s): Other (See Comments) Hyperkalemia Reaction: Hyperkalemia       Medication List        Accurate as of 12/11/16  11:59 PM. Always use your most recent med list.          acetaminophen 325 MG tablet Commonly known as:  TYLENOL Take 650 mg by mouth every 6 (six) hours as needed for mild pain, moderate pain, fever or headache.   albuterol 108 (90 Base) MCG/ACT inhaler Commonly known as:  PROVENTIL HFA;VENTOLIN HFA Inhale 2 puffs into the lungs every 4 (four) hours as needed for wheezing or shortness of breath.   amLODipine 10 MG tablet Commonly known as:  NORVASC Take 1 tablet (10 mg total) by mouth daily.   benzonatate 100 MG capsule Commonly known as:  TESSALON Take 200 mg by mouth every  8 (eight) hours as needed for cough.   bisacodyl 5 MG EC tablet Commonly known as:  DULCOLAX Take 1 tablet (5 mg total) by mouth daily as needed for moderate constipation.   cholecalciferol 1000 units tablet Commonly known as:  VITAMIN D Take 1,000 Units by mouth daily.   donepezil 10 MG tablet Commonly known as:  ARICEPT Take 10 mg by mouth at bedtime.   DULoxetine 20 MG capsule Commonly known as:  CYMBALTA Take 20 mg by mouth daily.   ENTRESTO 49-51 MG Generic drug:  sacubitril-valsartan Take 1 tablet by mouth 2 (two) times daily.   finasteride 5 MG tablet Commonly known as:  PROSCAR Take 5 mg by mouth daily. *staff : do not handle without gloves*   furosemide 20 MG tablet Commonly known as:  LASIX Take 2 tablets (40 mg) by mouth daily each morning. Take an additional 1 tablet (20 mg) by mouth  6 hours later.   GLUCAGON EMERGENCY 1 MG injection Generic drug:  glucagon Inject 1 mg into the vein once as needed.   GLUCERNA Liqd Take 1 Can by mouth daily.   guaiFENesin 100 MG/5ML liquid Commonly known as:  ROBITUSSIN Take 200 mg by mouth 3 (three) times daily.   HYDROcodone-acetaminophen 5-325 MG tablet Commonly known as:  NORCO/VICODIN Take 1 tablet by mouth every 6 (six) hours as needed for moderate pain.   insulin regular 100 units/mL injection Commonly known as:  NOVOLIN R,HUMULIN R Inject 3-12 Units into the skin 3 (three) times daily before meals. If BS < 60 call NP/PA, If BS is 175 to 250 give 3 units, 251 to 325 give 6 units, 326 to 450, give 10 units, if BS > 450 give 12 units and call NP/PA, check bs ac&hs, if < 175 document 0 units   metoprolol tartrate 25 MG tablet Commonly known as:  LOPRESSOR Take 1 tablet (25 mg total) by mouth 2 (two) times daily.   NAMENDA XR 28 MG Cp24 24 hr capsule Generic drug:  memantine Take 28 mg by mouth daily.   pantoprazole 20 MG tablet Commonly known as:  PROTONIX Take 20 mg by mouth daily.   pregabalin 75 MG  capsule Commonly known as:  LYRICA Take 1 capsule (75 mg total) by mouth 2 (two) times daily.   senna 8.6 MG Tabs tablet Commonly known as:  SENOKOT Take 1 tablet (8.6 mg total) by mouth daily.   STOOL SOFTENER 100 MG capsule Generic drug:  docusate sodium Take 100 mg by mouth daily as needed for mild constipation or moderate constipation.   tamsulosin 0.4 MG Caps capsule Commonly known as:  FLOMAX Take 0.4 mg by mouth at bedtime.   timolol 0.5 % ophthalmic solution Commonly known as:  TIMOPTIC Place 1 drop into both eyes 2 (two) times daily.  TOUJEO SOLOSTAR 300 UNIT/ML Sopn Generic drug:  Insulin Glargine Inject 60 Units into the skin every morning. 8 am   Triad Hydrophilic Wound Dressi Pste Apply cream topically to macerated areas of buttocks daily and prn   XARELTO 20 MG Tabs tablet Generic drug:  rivaroxaban Take 1 tablet by mouth daily.       Review of Systems  Unable to perform ROS: Dementia  Constitutional: Negative for activity change, appetite change, chills, diaphoresis and fever.  HENT: Negative for congestion, sneezing, sore throat, trouble swallowing and voice change.   Eyes: Negative.   Respiratory: Negative for apnea, cough, choking, chest tightness, shortness of breath and wheezing.   Cardiovascular: Positive for leg swelling (chronic edema). Negative for chest pain and palpitations.  Gastrointestinal: Negative for abdominal distention, abdominal pain, constipation, diarrhea and nausea.  Genitourinary: Negative for difficulty urinating, dysuria, frequency and urgency.  Musculoskeletal: Positive for arthralgias (typical arthritis). Negative for back pain, gait problem and myalgias.  Skin: Negative for color change, pallor, rash and wound.  Neurological: Positive for weakness. Negative for dizziness, tremors, syncope, speech difficulty, numbness and headaches.  Psychiatric/Behavioral: Negative for agitation and behavioral problems.  All other systems  reviewed and are negative.   Immunization History  Administered Date(s) Administered  . Influenza Split 12/04/2014  . Influenza,inj,Quad PF,6+ Mos 11/11/2015  . Influenza-Unspecified 11/17/2011, 12/04/2014, 10/30/2016  . PPD Test 02/25/2014, 03/11/2014, 04/08/2016  . Pneumococcal Polysaccharide-23 02/11/2008   Pertinent  Health Maintenance Due  Topic Date Due  . FOOT EXAM  07/16/1941  . OPHTHALMOLOGY EXAM  07/16/1941  . URINE MICROALBUMIN  07/16/1941  . PNA vac Low Risk Adult (2 of 2 - PCV13) 02/10/2009  . HEMOGLOBIN A1C  03/08/2015  . INFLUENZA VACCINE  Completed   No flowsheet data found. Functional Status Survey:    Vitals:   12/11/16 1358  BP: 118/62  Pulse: 72  Resp: 16  Temp: 98.6 F (37 C)  SpO2: 100%  Weight: 185 lb 11.2 oz (84.2 kg)  Height: 5\' 6"  (1.676 m)   Body mass index is 29.97 kg/m. Physical Exam  Constitutional: Vital signs are normal. He appears well-developed and well-nourished. He is active and cooperative. He does not appear ill. No distress.  HENT:  Head: Normocephalic and atraumatic.  Mouth/Throat: Uvula is midline, oropharynx is clear and moist and mucous membranes are normal. Mucous membranes are not pale, not dry and not cyanotic.  Eyes: Conjunctivae, EOM and lids are normal. Pupils are equal, round, and reactive to light.  Neck: Trachea normal, normal range of motion and full passive range of motion without pain. Neck supple. No JVD present. No tracheal deviation, no edema and no erythema present. No thyromegaly present.  Cardiovascular: Normal rate, regular rhythm, normal heart sounds, intact distal pulses and normal pulses. Exam reveals no gallop, no distant heart sounds and no friction rub.  No murmur heard. Pulses:      Dorsalis pedis pulses are 2+ on the right side, and 2+ on the left side.  Chronic 2+ bilateral lower extremity edema  Pulmonary/Chest: Effort normal. No accessory muscle usage. No respiratory distress. He has no decreased  breath sounds. He has no wheezes. He has no rhonchi. He has no rales. He exhibits no tenderness.  Abdominal: Normal appearance. He exhibits no distension and no ascites. Bowel sounds are decreased. There is no tenderness. There is no CVA tenderness.  Abdomen distended and taut. Non-tender. Sluggish bowel sounds.  Musculoskeletal: Normal range of motion. He exhibits no edema or tenderness.  Expected osteoarthritis, stiffness  Neurological: He is alert. He has normal strength. He is disoriented (to place). He exhibits abnormal muscle tone. Coordination and gait abnormal.  Skin: Skin is warm, dry and intact. No rash noted. He is not diaphoretic. No cyanosis or erythema. No pallor. Nails show no clubbing.  Psychiatric: His speech is normal and behavior is normal. Judgment and thought content normal. His affect is blunt. Cognition and memory are impaired. He exhibits abnormal recent memory.  Nursing note and vitals reviewed.   Labs reviewed: Recent Labs    07/18/16 1100 10/29/16 1440 10/30/16 0015 11/14/16 1236  NA 138 139 139 138  K 4.2 4.9 4.9 5.0  CL 102 107 107 101  CO2 28 25 26 26   GLUCOSE 174* 347* 321* 206*  BUN 35* 40* 45* 55*  CREATININE 1.95* 2.29* 2.35* 2.39*  CALCIUM 9.0 8.9 8.8* 9.1  MG 2.2  --   --   --    Recent Labs    07/18/16 1100 10/29/16 1440 11/14/16 1236  AST 46* 23 21  ALT 80* 15* 17  ALKPHOS 83 61 76  BILITOT 0.6 0.7 0.7  PROT 7.5 7.0 7.6  ALBUMIN 3.2* 3.0* 3.2*   Recent Labs    07/18/16 1100 10/29/16 1440 11/14/16 1236  WBC 4.7 5.8 9.6  NEUTROABS 3.0 3.9 7.8*  HGB 10.3* 9.6* 9.4*  HCT 30.5* 28.2* 27.0*  MCV 93.9 93.1 94.9  PLT 219 215 225   Lab Results  Component Value Date   TSH 4.410 07/18/2016   Lab Results  Component Value Date   HGBA1C 9.5 (H) 09/05/2014   Lab Results  Component Value Date   CHOL 177 07/18/2016   HDL 34 (L) 07/18/2016   LDLCALC 122 (H) 07/18/2016   TRIG 105 07/18/2016   CHOLHDL 5.2 07/18/2016    Significant  Diagnostic Results in last 30 days:  No results found.  Assessment/Plan 1. Late onset Alzheimer's disease without behavioral disturbance 2. Dementia in other diseases classified elsewhere without behavioral disturbance  Stable  Continue Donepezil 10 mg po Q HS  Continue Namenda 28 mg XR po Q Day  3. Other intervertebral disc degeneration, lumbar region  Stable   Continue Tylenol 650 mg po Q 4 hours prn pain  4. Glaucoma, unspecified glaucoma type, unspecified laterality  Stable    Continue Timolol 0.5%- 1 drop in both eyes BID  5. 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 Type 2 diabetes mellitus with other diabetic ophthalmic complication (HCC) 7. Type 2 diabetes mellitus with chronic kidney disease, with long-term current use of insulin, unspecified CKD stage (HCC) 8. Type 2 diabetes mellitus with diabetic polyneuropathy, with long-term current use of insulin (HCC)  Stable  Continue Toujeo 60 units SQ in the AM  Continue Novolin R- SSI prn  FSBS AC/HS  Continue Lyrica 75 mg po BID  9. 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 Hypertensive heart disease with heart failure (HCC)  Stable   Continue Entresto 49-51 1 tablet po BID  11. Major depressive disorder with single episode, remission status unspecified  Stable   Continue Cholecalciferol 1,000 units po Q Day  Change Duloxetine 20 mg po Q Day  12. Chronic systolic (congestive) heart failure (HCC)  Stable   Continue Furosemide 40 mg po Q AM  Continue Furosemide 20 mg po Q afternoon  13. Benign prostatic hyperplasia without lower urinary tract symptoms  stable  Continue Finasteride 5 mg po Q Day  Continue Prostate SR 160-250 po Q Day  Continue Tamslosin 0.4 mg po Q  HS  14. Pure hypercholesterolemia, unspecified  Stable   Continue Atorvastatin 20 mg po Q Day  15. Gastroesophageal reflux disease without esophagitis  Stable   Continue Protonix 20 mg po Q Day  16. Dysphagia, oral phase  Stable   Continue Glucerna 1 can BID  Continue Guaifenesin 10 mL po TID scheduled  17. Essential hypertension  Stable   Continue Amlodipine 10 mg po Q Day  Continue Metoprolol 25 mg po BID  18. Dermatitis associated with moisture  Triad Wound Dressing- liberal amount to area of skin irritation BID and prn  Continue all medications as listed above   Family/ staff Communication:   Total Time:  Documentation:  Face to Face:  Family/Phone:   Labs/tests ordered: not due  Medication list reviewed and assessed for continued appropriateness. Monthly medication orders reviewed and signed.  Brynda RimShannon H. Dilyn Smiles, NP-C Geriatrics [facility] [phone] [address] [place], [place] [address] Cell Phone (Mon-Fri 8am-5pm):  [phone] On Call:  [phone] & follow prompts after 5pm & weekends Office Phone:  [phone] Office Fax:  [phone]

## 2017-01-10 ENCOUNTER — Encounter
Admission: RE | Admit: 2017-01-10 | Discharge: 2017-01-10 | Disposition: A | Discharge status: Home / Self Care | Payer: Medicare Other | Source: Ambulatory Visit | Attending: Internal Medicine | Admitting: Internal Medicine

## 2017-01-13 ENCOUNTER — Non-Acute Institutional Stay (SKILLED_NURSING_FACILITY): Payer: Medicare Other | Admitting: Gerontology

## 2017-01-13 ENCOUNTER — Encounter: Payer: Self-pay | Admitting: Gerontology

## 2017-01-13 DIAGNOSIS — I110 Hypertensive heart disease with heart failure: Secondary | ICD-10-CM | POA: Diagnosis not present

## 2017-01-13 DIAGNOSIS — I10 Essential (primary) hypertension: Secondary | ICD-10-CM | POA: Diagnosis not present

## 2017-01-13 DIAGNOSIS — I82502 Chronic embolism and thrombosis of unspecified deep veins of left lower extremity: Secondary | ICD-10-CM | POA: Diagnosis not present

## 2017-02-10 ENCOUNTER — Encounter
Admission: RE | Admit: 2017-02-10 | Discharge: 2017-02-10 | Disposition: A | Discharge status: Home / Self Care | Payer: Medicare Other | Source: Ambulatory Visit | Attending: Internal Medicine | Admitting: Internal Medicine

## 2017-02-19 ENCOUNTER — Encounter: Payer: Self-pay | Admitting: Gerontology

## 2017-02-19 ENCOUNTER — Non-Acute Institutional Stay (SKILLED_NURSING_FACILITY): Payer: Medicare HMO | Admitting: Gerontology

## 2017-02-19 DIAGNOSIS — R1311 Dysphagia, oral phase: Secondary | ICD-10-CM

## 2017-02-19 DIAGNOSIS — E11319 Type 2 diabetes mellitus with unspecified diabetic retinopathy without macular edema: Secondary | ICD-10-CM | POA: Diagnosis not present

## 2017-02-19 DIAGNOSIS — Z794 Long term (current) use of insulin: Secondary | ICD-10-CM | POA: Diagnosis not present

## 2017-02-19 DIAGNOSIS — K219 Gastro-esophageal reflux disease without esophagitis: Secondary | ICD-10-CM

## 2017-02-19 NOTE — Progress Notes (Signed)
Location:   [facility] Room Number: [phone] Place of Service:  SNF [phone]) Provider:  Lorenso Quarry, NP-C  Marisue Ivan, MD  Patient Care Team: Marisue Ivan, MD as PCP - General (Family Medicine)  Extended Emergency Contact Information Primary Emergency Contact: [name] Address: [id number]          [place], [place] [address] [place] Home Phone: [phone] Relation: Spouse Secondary Emergency Contact: Christoper Fabian [place] of [place] Mobile Phone: [phone] Relation: Relative  Code Status:  FULL Goals of care: Advanced Directive information Advanced Directives 02/19/2017  Does Patient Have a Medical Advance Directive? No  Type of Advance Directive -  Does patient want to make changes to medical advance directive? -  Copy of Healthcare Power of Attorney in Chart? -  Would patient like information on creating a medical advance directive? No - Patient declined     Chief Complaint  Patient presents with  . Medical Management of Chronic Issues    Routine Visit    HPI:  Pt is a 84 y.o. male seen today for medical management of chronic diseases.    Gastroesophageal reflux disease without esophagitis Stable. No recent exacerbations. Symptoms managed on Protonix 20 mg po Q Day.  Dysphagia, oral phase Unstable. Staff reports pt is having increased frequency/ episodes of coughing with PO intake. Intermittent times when pt is having to be fed for safety. Pt is currently on NAS/NCS diet with thin liquids. Will have SLP evaluate pt.   Type 2 diabetes mellitus with retinopathy without macular edema, with long-term current use of insulin, unspecified laterality, unspecified retinopathy severity (HCC) Variable. Several recent episodes of AM mild hypoglycemia as well as PM hyperglycemia. Pt's PO intake is fluctuating r/t advancing dementia and dysphagia. Currently on Toujeo basal insulin with TID SSI/meal coverage.  Retinopathy stable. Pt is unable to attend outside Ophthamologist. Will refer to mobil service when available. No reports of blurred or altered vision or eye pain.    Pt appears to be starting to decline. Will approach wife about Palliative Care consult. Pt remains a FULL CODE. Please note, pt with advancing dementia. Unable to obtain complete ROS. Some ROS obtained from staff and documentation.    Past Medical History:  Diagnosis Date  . Arthritis   . Background retinopathy   . Cervicalgia   . Chronic kidney disease    follow up with a Dr.   . Chronic systolic CHF (congestive heart failure) (HCC)   . Congenital heart failure (HCC)   . Degeneration of lumbar or lumbosacral intervertebral disc   . Dementia   . Diabetes mellitus type 2, uncomplicated (HCC) 1996   on insulin since 2011, last eye exam with Dr. Inez Pilgrim 07/2013  . Diabetes mellitus without complication (HCC)   . DVT (deep venous thrombosis) (HCC)   . Essential hypertension    unspecified  . Generalized weakness   . Glaucoma   . Hypercholesteremia   . Hypertension   . Long-term insulin use (HCC)   . Moderate dementia   . Peripheral polyneuropathy   . Peripheral vascular disease (HCC)   . Pneumonia, unspecified organism 04/01/2014  . Pure hypercholesterolemia   . Spinal stenosis   . Spinal stenosis, lumbar region without neurogenic claudication    Past Surgical History:  Procedure Laterality Date  . ANGIOPLASTY / STENTING FEMORAL  1999  . ANTERIOR FUSION CERVICAL SPINE  07/2009  . c-spine fusion N/A   . COLONOSCOPY  05/23/2004  Dr. Maryruth Bun, normal, PH colon polpys    Allergies  Allergen Reactions  . Fosinopril Other (See Comments)    Other reaction(s): Other (See Comments) Hyperkalemia Reaction: Hyperkalemia     Allergies as of 02/19/2017      Reactions   Fosinopril Other (See Comments)   Other reaction(s): Other (See Comments) Hyperkalemia Reaction: Hyperkalemia       Medication List         Accurate as of 02/19/17 11:45 AM. Always use your most recent med list.          acetaminophen 325 MG tablet Commonly known as:  TYLENOL Take 650 mg by mouth every 6 (six) hours as needed for mild pain, moderate pain, fever or headache.   albuterol 108 (90 Base) MCG/ACT inhaler Commonly known as:  PROVENTIL HFA;VENTOLIN HFA Inhale 2 puffs into the lungs every 4 (four) hours as needed for wheezing or shortness of breath.   amLODipine 10 MG tablet Commonly known as:  NORVASC Take 1 tablet (10 mg total) by mouth daily.   benzonatate 100 MG capsule Commonly known as:  TESSALON Take 200 mg by mouth every 8 (eight) hours as needed for cough.   bisacodyl 5 MG EC tablet Commonly known as:  DULCOLAX Take 1 tablet (5 mg total) by mouth daily as needed for moderate constipation.   cholecalciferol 1000 units tablet Commonly known as:  VITAMIN D Take 1,000 Units by mouth daily.   donepezil 10 MG tablet Commonly known as:  ARICEPT Take 10 mg by mouth at bedtime.   DULoxetine 20 MG capsule Commonly known as:  CYMBALTA Take 20 mg by mouth daily.   ENTRESTO 49-51 MG Generic drug:  sacubitril-valsartan Take 1 tablet by mouth 2 (two) times daily.   finasteride 5 MG tablet Commonly known as:  PROSCAR Take 5 mg by mouth daily. *staff : do not handle without gloves*   furosemide 20 MG tablet Commonly known as:  LASIX Take 2 tablets (40 mg) by mouth daily each morning. Take an additional 1 tablet (20 mg) by mouth  6 hours later.   GLUCAGON EMERGENCY 1 MG injection Generic drug:  glucagon Inject 1 mg into the vein once as needed.   GLUCERNA Liqd Take 1 Can by mouth daily.   HYDROcodone-acetaminophen 5-325 MG tablet Commonly known as:  NORCO/VICODIN Take 1 tablet by mouth every 6 (six) hours as needed for moderate pain.   insulin regular 100 units/mL injection Commonly known as:  NOVOLIN R,HUMULIN R Inject 3-12 Units into the skin 3 (three) times daily before meals. If BS < 60  call NP/PA, If BS is 175 to 250 give 3 units, 251 to 325 give 6 units, 326 to 450, give 10 units, if BS > 450 give 12 units and call NP/PA, check bs ac&hs, if < 175 document 0 units   metoprolol tartrate 25 MG tablet Commonly known as:  LOPRESSOR Take 1 tablet (25 mg total) by mouth 2 (two) times daily.   NAMENDA XR 28 MG Cp24 24 hr capsule Generic drug:  memantine Take 28 mg by mouth daily.   pantoprazole 20 MG tablet Commonly known as:  PROTONIX Take 20 mg by mouth daily.   pravastatin 10 MG tablet Commonly known as:  PRAVACHOL Take 10 mg by mouth daily.   pregabalin 75 MG capsule Commonly known as:  LYRICA Take 1 capsule (75 mg total) by mouth 2 (two) times daily.   senna 8.6 MG Tabs tablet Commonly known as:  SENOKOT Take  1 tablet (8.6 mg total) by mouth daily.   STOOL SOFTENER 100 MG capsule Generic drug:  docusate sodium Take 100 mg by mouth daily as needed for mild constipation or moderate constipation.   tamsulosin 0.4 MG Caps capsule Commonly known as:  FLOMAX Take 0.4 mg by mouth at bedtime.   timolol 0.5 % ophthalmic solution Commonly known as:  TIMOPTIC Place 1 drop into both eyes 2 (two) times daily.   TOUJEO SOLOSTAR 300 UNIT/ML Sopn Generic drug:  Insulin Glargine Inject 60 Units into the skin every morning. 8 am   Triad Hydrophilic Wound Dressi Pste Apply cream topically to macerated areas of buttocks daily and prn   XARELTO 20 MG Tabs tablet Generic drug:  rivaroxaban Take 1 tablet by mouth daily.       Review of Systems  Unable to perform ROS: Dementia  Constitutional: Negative for activity change, appetite change, chills, diaphoresis and fever.  HENT: Negative for congestion, mouth sores, nosebleeds, postnasal drip, sneezing, sore throat, trouble swallowing and voice change.   Eyes: Negative for photophobia, redness and visual disturbance.  Respiratory: Negative for apnea, cough, choking, chest tightness, shortness of breath and wheezing.     Cardiovascular: Positive for leg swelling. Negative for chest pain and palpitations.  Gastrointestinal: Negative for abdominal distention, abdominal pain, constipation, diarrhea and nausea.  Genitourinary: Negative for difficulty urinating, dysuria, frequency and urgency.  Musculoskeletal: Positive for arthralgias (typical arthritis). Negative for back pain, gait problem and myalgias.  Skin: Negative for color change, pallor, rash and wound.  Neurological: Negative for dizziness, tremors, syncope, speech difficulty, weakness, numbness and headaches.  Psychiatric/Behavioral: Positive for confusion. Negative for agitation and behavioral problems.  All other systems reviewed and are negative.   Immunization History  Administered Date(s) Administered  . Influenza Split 12/04/2014  . Influenza,inj,Quad PF,6+ Mos 11/11/2015  . Influenza-Unspecified 11/17/2011, 12/04/2014, 10/30/2016  . PPD Test 02/25/2014, 03/11/2014, 04/08/2016  . Pneumococcal Polysaccharide-23 02/11/2008   Pertinent  Health Maintenance Due  Topic Date Due  . FOOT EXAM  07/16/1941  . OPHTHALMOLOGY EXAM  07/16/1941  . URINE MICROALBUMIN  07/16/1941  . PNA vac Low Risk Adult (2 of 2 - PCV13) 02/10/2009  . HEMOGLOBIN A1C  03/08/2015  . INFLUENZA VACCINE  Completed   No flowsheet data found. Functional Status Survey:    Vitals:   02/19/17 1107  BP: 122/70  Pulse: 72  Resp: 12  Temp: (!) 97.5 F (36.4 C)  TempSrc: Oral  SpO2: 100%  Weight: 188 lb 8 oz (85.5 kg)  Height: 5\' 6"  (1.676 m)   Body mass index is 30.42 kg/m. Physical Exam  Constitutional: Vital signs are normal. He appears well-developed and well-nourished. He is active and cooperative. He does not appear ill. No distress.  HENT:  Head: Normocephalic and atraumatic.  Mouth/Throat: Uvula is midline, oropharynx is clear and moist and mucous membranes are normal. Mucous membranes are not pale, not dry and not cyanotic.  Eyes: Conjunctivae, EOM and  lids are normal. Pupils are equal, round, and reactive to light.  Neck: Trachea normal, normal range of motion and full passive range of motion without pain. Neck supple. No JVD present. No tracheal deviation, no edema and no erythema present. No thyromegaly present.  Cardiovascular: Normal rate, regular rhythm, normal heart sounds, intact distal pulses and normal pulses. Exam reveals no gallop, no distant heart sounds and no friction rub.  No murmur heard. Pulses:      Dorsalis pedis pulses are 2+ on the right side,  and 2+ on the left side.  Chronic 1-2+ BLE edema  Pulmonary/Chest: Effort normal and breath sounds normal. No accessory muscle usage. No respiratory distress. He has no decreased breath sounds. He has no wheezes. He has no rhonchi. He has no rales. He exhibits no tenderness.  Abdominal: Soft. Normal appearance and bowel sounds are normal. He exhibits no distension and no ascites. There is no tenderness.  Musculoskeletal: Normal range of motion. He exhibits no edema or tenderness.  Expected osteoarthritis, stiffness; Bilateral Calves soft, supple. Negative Homan's Sign. B- pedal pulses equal; generalized weakness, immobile  Neurological: He is alert. He has normal strength. He displays atrophy. He exhibits abnormal muscle tone. Coordination and gait abnormal.  Skin: Skin is warm, dry and intact. He is not diaphoretic. No cyanosis. No pallor. Nails show no clubbing.  Psychiatric: His speech is normal and behavior is normal. Judgment and thought content normal. His affect is blunt. Cognition and memory are impaired. He exhibits abnormal recent memory and abnormal remote memory.  Nursing note and vitals reviewed.   Labs reviewed: Recent Labs    07/18/16 1100 10/29/16 1440 10/30/16 0015 11/14/16 1236  NA 138 139 139 138  K 4.2 4.9 4.9 5.0  CL 102 107 107 101  CO2 28 25 26 26   GLUCOSE 174* 347* 321* 206*  BUN 35* 40* 45* 55*  CREATININE 1.95* 2.29* 2.35* 2.39*  CALCIUM 9.0 8.9  8.8* 9.1  MG 2.2  --   --   --    Recent Labs    07/18/16 1100 10/29/16 1440 11/14/16 1236  AST 46* 23 21  ALT 80* 15* 17  ALKPHOS 83 61 76  BILITOT 0.6 0.7 0.7  PROT 7.5 7.0 7.6  ALBUMIN 3.2* 3.0* 3.2*   Recent Labs    07/18/16 1100 10/29/16 1440 11/14/16 1236  WBC 4.7 5.8 9.6  NEUTROABS 3.0 3.9 7.8*  HGB 10.3* 9.6* 9.4*  HCT 30.5* 28.2* 27.0*  MCV 93.9 93.1 94.9  PLT 219 215 225   Lab Results  Component Value Date   TSH 4.410 07/18/2016   Lab Results  Component Value Date   HGBA1C 9.5 (H) 09/05/2014   Lab Results  Component Value Date   CHOL 177 07/18/2016   HDL 34 (L) 07/18/2016   LDLCALC 122 (H) 07/18/2016   TRIG 105 07/18/2016   CHOLHDL 5.2 07/18/2016    Significant Diagnostic Results in last 30 days:  No results found.  Assessment/Plan  Gastroesophageal reflux disease without esophagitis  Dysphagia, oral phase  Type 2 diabetes mellitus with retinopathy without macular edema, with long-term current use of insulin, unspecified laterality, unspecified retinopathy severity (HCC)   GERD stable  SLP consult to eval and treat dysphagia/ symptoms of aspiration  Staff to assist with meals as needed  Staff to ensure pt is sitting upright for meals  Good mouth care  Refer to Ophthamologist when mobile service available for DM retinopathy  Monitor for fluctuations in blood glucose  Continue to monitor FSBS BID and prn   Family/ staff Communication:   Total Time:  Documentation:  Face to Face:  Family/Phone:   Labs/tests ordered:    Medication list reviewed and assessed for continued appropriateness. Monthly medication orders reviewed and signed.  Brynda Rim, NP-C Geriatrics [facility] [phone], [place] [address] Cell Phone (Mon-Fri 8am-5pm):  [phone] On Call:  [phone] & follow prompts after 5pm & weekends Office Phone:  [phone] Office Fax:  [phone]

## 2017-02-25 ENCOUNTER — Non-Acute Institutional Stay (SKILLED_NURSING_FACILITY): Payer: Medicare HMO | Admitting: Gerontology

## 2017-02-25 ENCOUNTER — Encounter: Payer: Self-pay | Admitting: Gerontology

## 2017-02-25 DIAGNOSIS — J189 Pneumonia, unspecified organism: Secondary | ICD-10-CM

## 2017-02-25 DIAGNOSIS — J181 Lobar pneumonia, unspecified organism: Secondary | ICD-10-CM

## 2017-02-25 NOTE — Progress Notes (Signed)
Location:   [facility] Room Number: [id number] Place of Service:  SNF [phone]) Provider:  Lorenso Quarry, NP-C  Marisue Ivan, MD  Patient Care Team: Marisue Ivan, MD as PCP - General (Family Medicine)  Extended Emergency Contact Information Primary Emergency Contact: [name] Address: [phone]          [place], [place] [address] [place] Home Phone: [phone] Relation: Spouse Secondary Emergency Contact: Christoper Fabian [place] of [place] Mobile Phone: [phone] Relation: Relative  Code Status:  FULL Goals of care: Advanced Directive information Advanced Directives 02/25/2017  Does Patient Have a Medical Advance Directive? No  Type of Advance Directive -  Does patient want to make changes to medical advance directive? -  Copy of Healthcare Power of Attorney in Chart? -  Would patient like information on creating a medical advance directive? No - Patient declined     Chief Complaint  Patient presents with  . Acute Visit    Recheck patient's cough    HPI:  Pt is a 84 y.o. [place]rd seen today for an acute visit for cough and decreased oxygen saturations. Staff reports pt has had an increased amount/frequency of coughing with PO intake. He began having wet sounding, but non-productive cough yesterday with decreased oxygen saturations and low grade fever. FSBS increase and increased somnolence. Pt denies chest pain or shortness of breath. However, pt has advanced dementia and unable to give a complete/ accurate ROS.    Past Medical History:  Diagnosis Date  . Arthritis   . Background retinopathy   . Cervicalgia   . Chronic kidney disease    follow up with a Dr.   . Chronic systolic CHF (congestive heart failure) (HCC)   . Congenital heart failure (HCC)   . Degeneration of lumbar or lumbosacral intervertebral disc   . Dementia   . Diabetes mellitus type 2, uncomplicated (HCC) 1996   on insulin since 2011,  last eye exam with Dr. Inez Pilgrim 07/2013  . Diabetes mellitus without complication (HCC)   . DVT (deep venous thrombosis) (HCC)   . Essential hypertension    unspecified  . Generalized weakness   . Glaucoma   . Hypercholesteremia   . Hypertension   . Long-term insulin use (HCC)   . Moderate dementia   . Peripheral polyneuropathy   . Peripheral vascular disease (HCC)   . Pneumonia, unspecified organism 04/01/2014  . Pure hypercholesterolemia   . Spinal stenosis   . Spinal stenosis, lumbar region without neurogenic claudication    Past Surgical History:  Procedure Laterality Date  . ANGIOPLASTY / STENTING FEMORAL  1999  . ANTERIOR FUSION CERVICAL SPINE  07/2009  . c-spine fusion N/A   . COLONOSCOPY  05/23/2004   Dr. Maryruth Bun, normal, PH colon polpys    Allergies  Allergen Reactions  . Fosinopril Other (See Comments)    Other reaction(s): Other (See Comments) Hyperkalemia Reaction: Hyperkalemia     Allergies as of 02/25/2017      Reactions   Fosinopril Other (See Comments)   Other reaction(s): Other (See Comments) Hyperkalemia Reaction: Hyperkalemia       Medication List        Accurate as of 02/25/17  4:01 PM. Always use your most recent med list.          acetaminophen 325 MG tablet Commonly known as:  TYLENOL Take 650 mg by mouth every 6 (six) hours as needed for mild pain, moderate pain, fever or headache.  albuterol 108 (90 Base) MCG/ACT inhaler Commonly known as:  PROVENTIL HFA;VENTOLIN HFA Inhale 2 puffs into the lungs every 4 (four) hours as needed for wheezing or shortness of breath.   amLODipine 10 MG tablet Commonly known as:  NORVASC Take 1 tablet (10 mg total) by mouth daily.   benzonatate 100 MG capsule Commonly known as:  TESSALON Take 200 mg by mouth every 8 (eight) hours as needed for cough.   bisacodyl 5 MG EC tablet Commonly known as:  DULCOLAX Take 1 tablet (5 mg total) by mouth daily as needed for moderate constipation.     cholecalciferol 1000 units tablet Commonly known as:  VITAMIN D Take 1,000 Units by mouth daily.   clindamycin 300 MG capsule Commonly known as:  CLEOCIN Take 600 mg by mouth every 8 (eight) hours. 2 caps   donepezil 10 MG tablet Commonly known as:  ARICEPT Take 10 mg by mouth at bedtime.   DULoxetine 20 MG capsule Commonly known as:  CYMBALTA Take 20 mg by mouth daily.   ENTRESTO 49-51 MG Generic drug:  sacubitril-valsartan Take 1 tablet by mouth 2 (two) times daily.   finasteride 5 MG tablet Commonly known as:  PROSCAR Take 5 mg by mouth daily. *staff : do not handle without gloves*   furosemide 20 MG tablet Commonly known as:  LASIX Take 2 tablets (40 mg) by mouth daily each morning. Take an additional 1 tablet (20 mg) by mouth  6 hours later.   GLUCAGON EMERGENCY 1 MG injection Generic drug:  glucagon Inject 1 mg into the vein once as needed.   GLUCERNA Liqd Take 1 Can by mouth daily.   HYDROcodone-acetaminophen 5-325 MG tablet Commonly known as:  NORCO/VICODIN Take 1 tablet by mouth every 6 (six) hours as needed for moderate pain.   insulin regular 100 units/mL injection Commonly known as:  NOVOLIN R,HUMULIN R Inject 3-12 Units into the skin 3 (three) times daily before meals. If BS < 60 call NP/PA, If BS is 175 to 250 give 3 units, 251 to 325 give 6 units, 326 to 450, give 10 units, if BS > 450 give 12 units and call NP/PA, check bs ac&hs, if < 175 document 0 units   ipratropium-albuterol 0.5-2.5 (3) MG/3ML Soln Commonly known as:  DUONEB Take 3 mLs by nebulization 3 (three) times daily.   metoprolol tartrate 25 MG tablet Commonly known as:  LOPRESSOR Take 1 tablet (25 mg total) by mouth 2 (two) times daily.   NAMENDA XR 28 MG Cp24 24 hr capsule Generic drug:  memantine Take 28 mg by mouth daily.   OXYGEN Inhale 2 L into the lungs as needed. Check o2 sat prior to placing on patient and at least every 4 hours after applying   pantoprazole 20 MG  tablet Commonly known as:  PROTONIX Take 20 mg by mouth daily.   pravastatin 10 MG tablet Commonly known as:  PRAVACHOL Take 10 mg by mouth daily.   pregabalin 75 MG capsule Commonly known as:  LYRICA Take 1 capsule (75 mg total) by mouth 2 (two) times daily.   RISA-BID PROBIOTIC Tabs Take 1 tablet by mouth 2 (two) times daily.   senna 8.6 MG Tabs tablet Commonly known as:  SENOKOT Take 1 tablet (8.6 mg total) by mouth daily.   STOOL SOFTENER 100 MG capsule Generic drug:  docusate sodium Take 100 mg by mouth daily as needed for mild constipation or moderate constipation.   tamsulosin 0.4 MG Caps capsule Commonly  known as:  FLOMAX Take 0.4 mg by mouth at bedtime.   timolol 0.5 % ophthalmic solution Commonly known as:  TIMOPTIC Place 1 drop into both eyes 2 (two) times daily.   TOUJEO SOLOSTAR 300 UNIT/ML Sopn Generic drug:  Insulin Glargine Inject 60 Units into the skin every morning. 8 am   Triad Hydrophilic Wound Dressi Pste Apply cream topically to macerated areas of buttocks daily and prn   XARELTO 20 MG Tabs tablet Generic drug:  rivaroxaban Take 1 tablet by mouth daily.       Review of Systems  Unable to perform ROS: Dementia  Constitutional: Positive for fatigue and fever. Negative for activity change, appetite change, chills and diaphoresis.  HENT: Positive for trouble swallowing. Negative for congestion, mouth sores, nosebleeds, postnasal drip, sneezing, sore throat and voice change.   Respiratory: Positive for cough and choking. Negative for apnea, chest tightness, shortness of breath and wheezing.   Cardiovascular: Negative for chest pain, palpitations and leg swelling.  Gastrointestinal: Negative for abdominal distention, abdominal pain, constipation, diarrhea and nausea.  Genitourinary: Negative for difficulty urinating, dysuria, frequency and urgency.  Musculoskeletal: Negative for back pain, gait problem and myalgias. Arthralgias: typical arthritis.   Skin: Negative for color change, pallor, rash and wound.  Neurological: Negative for dizziness, tremors, seizures, syncope, facial asymmetry, speech difficulty, weakness, numbness and headaches.  Psychiatric/Behavioral: Positive for confusion. Negative for agitation and behavioral problems.  All other systems reviewed and are negative.   Immunization History  Administered Date(s) Administered  . Influenza Split 12/04/2014  . Influenza,inj,Quad PF,6+ Mos 11/11/2015  . Influenza-Unspecified 11/17/2011, 12/04/2014, 10/30/2016  . PPD Test 02/25/2014, 03/11/2014, 04/08/2016  . Pneumococcal Polysaccharide-23 02/11/2008   Pertinent  Health Maintenance Due  Topic Date Due  . FOOT EXAM  07/16/1941  . OPHTHALMOLOGY EXAM  07/16/1941  . URINE MICROALBUMIN  07/16/1941  . PNA vac Low Risk Adult (2 of 2 - PCV13) 02/10/2009  . HEMOGLOBIN A1C  03/08/2015  . INFLUENZA VACCINE  Completed   No flowsheet data found. Functional Status Survey:    Vitals:   02/25/17 1529  BP: 112/64  Pulse: 83  Resp: 20  Temp: (!) 100.8 F (38.2 C)  TempSrc: Oral  SpO2: 93%  Weight: 183 lb 11.2 oz (83.3 kg)  Height: 5\' 6"  (1.676 m)   Body mass index is 29.65 kg/m. Physical Exam  Constitutional: Vital signs are normal. He appears well-developed and well-nourished. He is active and cooperative. He does not appear ill. No distress. Nasal cannula in place.  HENT:  Head: Normocephalic and atraumatic.  Mouth/Throat: Uvula is midline, oropharynx is clear and moist and mucous membranes are normal. Mucous membranes are not pale, not dry and not cyanotic.  Eyes: Conjunctivae, EOM and lids are normal. Pupils are equal, round, and reactive to light.  Neck: Trachea normal, normal range of motion and full passive range of motion without pain. Neck supple. No JVD present. No tracheal deviation, no edema and no erythema present. No thyromegaly present.  Cardiovascular: Normal rate, regular rhythm, normal heart sounds,  intact distal pulses and normal pulses. Exam reveals no gallop, no distant heart sounds and no friction rub.  No murmur heard. Pulses:      Dorsalis pedis pulses are 2+ on the right side, and 2+ on the left side.  Chronic 2+ BLE edema  Pulmonary/Chest: Effort normal. No accessory muscle usage. No respiratory distress. He has decreased breath sounds in the right upper field, the right middle field, the left upper field,  the left middle field and the left lower field. He has no wheezes. He has rhonchi in the right lower field. He has rales in the right lower field. He exhibits no tenderness.  Abdominal: Soft. Normal appearance and bowel sounds are normal. He exhibits no distension and no ascites. There is no tenderness.  Musculoskeletal: Normal range of motion. He exhibits no edema or tenderness.  Expected osteoarthritis, stiffness; Bilateral Calves soft, supple. Negative Homan's Sign. B- pedal pulses equal; generalized weakness  Neurological: He is alert. He has normal strength. He displays atrophy. A cranial nerve deficit and sensory deficit is present. He exhibits abnormal muscle tone. Coordination and gait abnormal.  Skin: Skin is warm, dry and intact. He is not diaphoretic. No cyanosis. No pallor. Nails show no clubbing.  Psychiatric: His speech is normal and behavior is normal. Judgment and thought content normal. His affect is blunt. Cognition and memory are impaired. He exhibits abnormal recent memory and abnormal remote memory.  Nursing note and vitals reviewed.   Labs reviewed: Recent Labs    07/18/16 1100 10/29/16 1440 10/30/16 0015 11/14/16 1236  NA 138 139 139 138  K 4.2 4.9 4.9 5.0  CL 102 107 107 101  CO2 28 25 26 26   GLUCOSE 174* 347* 321* 206*  BUN 35* 40* 45* 55*  CREATININE 1.95* 2.29* 2.35* 2.39*  CALCIUM 9.0 8.9 8.8* 9.1  MG 2.2  --   --   --    Recent Labs    07/18/16 1100 10/29/16 1440 11/14/16 1236  AST 46* 23 21  ALT 80* 15* 17  ALKPHOS 83 61 76  BILITOT  0.6 0.7 0.7  PROT 7.5 7.0 7.6  ALBUMIN 3.2* 3.0* 3.2*   Recent Labs    07/18/16 1100 10/29/16 1440 11/14/16 1236  WBC 4.7 5.8 9.6  NEUTROABS 3.0 3.9 7.8*  HGB 10.3* 9.6* 9.4*  HCT 30.5* 28.2* 27.0*  MCV 93.9 93.1 94.9  PLT 219 215 225   Lab Results  Component Value Date   TSH 4.410 07/18/2016   Lab Results  Component Value Date   HGBA1C 9.5 (H) 09/05/2014   Lab Results  Component Value Date   CHOL 177 07/18/2016   HDL 34 (L) 07/18/2016   LDLCALC 122 (H) 07/18/2016   TRIG 105 07/18/2016   CHOLHDL 5.2 07/18/2016    Significant Diagnostic Results in last 30 days:  No results found.  Assessment/Plan Pneumonia of right lower lobe due to infectious organism (HCC)  Rocephin 2 grams IM x 1 (given yesterday)  Clindamycin 600 mg po Q 8 hours x 7 days  Risa-Bid 1 tablet po BID  Duonebs TID scheduled x 3 days  CXR  SLP eval and treat  Family/ staff Communication:   Total Time:  Documentation:  Face to Face:  Family/Phone:   Labs/tests ordered:  2 view CXR  Medication list reviewed and assessed for continued appropriateness.  Brynda Rim, NP-C Geriatrics [facility] [phone] [place], [place] [address] Cell Phone (Mon-Fri 8am-5pm):  [phone] On Call:  [phone] & follow prompts after 5pm & weekends Office Phone:  [phone] Office Fax:  [phone]

## 2017-02-27 NOTE — Assessment & Plan Note (Signed)
Stable. No recent episodes of hypotension. BPs have remained stable on Entresto. Metoprolol, Norvasc and Lasix

## 2017-02-27 NOTE — Assessment & Plan Note (Signed)
Stable. No recent episodes of hypotension. Chronic 1+ BLE edema. Denies chest pain and shortness of breath

## 2017-02-27 NOTE — Assessment & Plan Note (Signed)
Variable. Several recent episodes of AM mild hypoglycemia as well as PM hyperglycemia. Pt's PO intake is fluctuating r/t advancing dementia and dysphagia. Currently on Toujeo basal insulin with TID SSI/meal coverage. Retinopathy stable. Pt is unable to attend outside Ophthamologist. Will refer to mobil service when available. No reports of blurred or altered vision or eye pain.

## 2017-02-27 NOTE — Assessment & Plan Note (Signed)
Stable. No recent exacerbations. Symptoms managed on Protonix 20 mg po Q Day.

## 2017-02-27 NOTE — Assessment & Plan Note (Signed)
Stable. No complaints of BLE pain. Calves soft and supple, though edematous. Negative Homan's sign. Pt remains on Xarelto 20 mg po Q Day for DVT prophylaxis.

## 2017-02-27 NOTE — Progress Notes (Signed)
Location:    Nursing Home Room Number: 811B Place of Service:  SNF (31) Provider:  Toni Arthurs, NP-C  Dion Body, MD  Patient Care Team: Dion Body, MD as PCP - General (Family Medicine)  Extended Emergency Contact Information Primary Emergency Contact: Vida Rigger Address: [address]          [place], [place] [address] [place] Phone: [phone] Relation: Spouse Secondary Emergency Contact: John Giovanni [place] Mobile Phone: [phone] Relation: Relative  Code Status:  FULL Goals of care: Advanced Directive information Advanced Directives 02/25/2017  Does Patient Have a Medical Advance Directive? No  Type of Advance Directive -  Does patient want to make changes to medical advance directive? -  Copy of [place] in Chart? -  Would patient like information on creating a medical advance directive? No - Patient declined     Chief Complaint  Patient presents with  . Medical Management of Chronic Issues    Routine Visit    HPI:  Pt is a 84 y.o. male seen today for medical management of chronic diseases.    Hypertensive heart disease with heart failure (HCC) Stable. No recent episodes of hypotension. Chronic 1+ BLE edema. Denies chest pain and shortness of breath  HTN (hypertension) Stable. No recent episodes of hypotension. BPs have remained stable on Entresto. Metoprolol, Norvasc and Lasix  Chronic embolism and thrombosis of unspecified deep veins of left lower extremity (HCC) Stable. No complaints of BLE pain. Calves soft and supple, though edematous. Negative Homan's sign. Pt remains on Xarelto 20 mg po Q Day for DVT prophylaxis.   Please note, pt has dementia and minimally verbal. Unable to obtain complete ROS. Some ROS gathered from staff and documentation.   Past Medical History:  Diagnosis Date  . Arthritis   . Background retinopathy   . Cervicalgia   . Chronic kidney  disease    follow up with a Dr.   . Chronic systolic CHF (congestive heart failure) ([place])   . Congenital heart failure ([place])   . Degeneration of lumbar or lumbosacral intervertebral disc   . Dementia   . Diabetes mellitus type 2, uncomplicated ([place]) 8413   on insulin since 2011, last eye exam with Dr. Wallace Going 07/2013  . Diabetes mellitus without complication ([place])   . DVT (deep venous thrombosis) ([place])   . Essential hypertension    unspecified  . Generalized weakness   . Glaucoma   . Hypercholesteremia   . Hypertension   . Long-term insulin use ([place])   . Moderate dementia   . Peripheral polyneuropathy   . Peripheral vascular disease ([place])   . Pneumonia, unspecified organism 04/01/2014  . Pure hypercholesterolemia   . Spinal stenosis   . Spinal stenosis, lumbar region without neurogenic claudication    Past Surgical History:  Procedure Laterality Date  . ANGIOPLASTY / STENTING FEMORAL  1999  . ANTERIOR FUSION CERVICAL SPINE  07/2009  . c-spine fusion N/A   . COLONOSCOPY  05/23/2004   Dr. Nicolasa Ducking, normal, [place] colon polpys    Allergies  Allergen Reactions  . Fosinopril Other (See Comments)    Other reaction(s): Other (See Comments) Hyperkalemia Reaction: Hyperkalemia     Allergies as of 01/13/2017      Reactions   Fosinopril Other (See Comments)   Other reaction(s): Other (See Comments) Hyperkalemia Reaction: Hyperkalemia       Medication List        Accurate as of 01/13/17  11:59 PM. Always use your most recent med list.          acetaminophen 325 MG tablet Commonly known as:  TYLENOL Take 650 mg by mouth every 6 (six) hours as needed for mild pain, moderate pain, fever or headache.   albuterol 108 (90 Base) MCG/ACT inhaler Commonly known as:  PROVENTIL HFA;VENTOLIN HFA Inhale 2 puffs into the lungs every 4 (four) hours as needed for wheezing or shortness of breath.   amLODipine 10 MG tablet Commonly known as:  NORVASC Take 1 tablet (10 mg total) by mouth  daily.   benzonatate 100 MG capsule Commonly known as:  TESSALON Take 200 mg by mouth every 8 (eight) hours as needed for cough.   bisacodyl 5 MG EC tablet Commonly known as:  DULCOLAX Take 1 tablet (5 mg total) by mouth daily as needed for moderate constipation.   cholecalciferol 1000 units tablet Commonly known as:  VITAMIN D Take 1,000 Units by mouth daily.   donepezil 10 MG tablet Commonly known as:  ARICEPT Take 10 mg by mouth at bedtime.   DULoxetine 20 MG capsule Commonly known as:  CYMBALTA Take 20 mg by mouth daily.   ENTRESTO 49-51 MG Generic drug:  sacubitril-valsartan Take 1 tablet by mouth 2 (two) times daily.   finasteride 5 MG tablet Commonly known as:  PROSCAR Take 5 mg by mouth daily. *staff : do not handle without gloves*   furosemide 20 MG tablet Commonly known as:  LASIX Take 2 tablets (40 mg) by mouth daily each morning. Take an additional 1 tablet (20 mg) by mouth  6 hours later.   GLUCAGON EMERGENCY 1 MG injection Generic drug:  glucagon Inject 1 mg into the vein once as needed.   GLUCERNA Liqd Take 1 Can by mouth daily.   guaiFENesin 100 MG/5ML liquid Commonly known as:  ROBITUSSIN Take 200 mg by mouth 3 (three) times daily.   HYDROcodone-acetaminophen 5-325 MG tablet Commonly known as:  NORCO/VICODIN Take 1 tablet by mouth every 6 (six) hours as needed for moderate pain.   insulin regular 100 units/mL injection Commonly known as:  NOVOLIN R,HUMULIN R Inject 3-12 Units into the skin 3 (three) times daily before meals. If BS < 60 call NP/PA, If BS is 175 to 250 give 3 units, 251 to 325 give 6 units, 326 to 450, give 10 units, if BS > 450 give 12 units and call NP/PA, check bs ac&hs, if < 175 document 0 units   metoprolol tartrate 25 MG tablet Commonly known as:  LOPRESSOR Take 1 tablet (25 mg total) by mouth 2 (two) times daily.   NAMENDA XR 28 MG Cp24 24 hr capsule Generic drug:  memantine Take 28 mg by mouth daily.   pantoprazole  20 MG tablet Commonly known as:  PROTONIX Take 20 mg by mouth daily.   pregabalin 75 MG capsule Commonly known as:  LYRICA Take 1 capsule (75 mg total) by mouth 2 (two) times daily.   senna 8.6 MG Tabs tablet Commonly known as:  SENOKOT Take 1 tablet (8.6 mg total) by mouth daily.   STOOL SOFTENER 100 MG capsule Generic drug:  docusate sodium Take 100 mg by mouth daily as needed for mild constipation or moderate constipation.   tamsulosin 0.4 MG Caps capsule Commonly known as:  FLOMAX Take 0.4 mg by mouth at bedtime.   timolol 0.5 % ophthalmic solution Commonly known as:  TIMOPTIC Place 1 drop into both eyes 2 (two) times daily.  TOUJEO SOLOSTAR 300 UNIT/ML Sopn Generic drug:  Insulin Glargine Inject 60 Units into the skin every morning. 8 am   Triad Hydrophilic Wound Dressi Pste Apply cream topically to macerated areas of buttocks daily and prn   XARELTO 20 MG Tabs tablet Generic drug:  rivaroxaban Take 1 tablet by mouth daily.       Review of Systems  Unable to perform ROS: Dementia  Constitutional: Negative for activity change, appetite change, chills, diaphoresis and fever.  HENT: Negative for congestion, mouth sores, nosebleeds, postnasal drip, sneezing, sore throat, trouble swallowing and voice change.   Respiratory: Negative for apnea, cough, choking, chest tightness, shortness of breath and wheezing.   Cardiovascular: Positive for leg swelling. Negative for chest pain and palpitations.  Gastrointestinal: Negative for abdominal distention, abdominal pain, constipation, diarrhea and nausea.  Genitourinary: Negative for difficulty urinating, dysuria, frequency and urgency.  Musculoskeletal: Positive for arthralgias (typical arthritis). Negative for back pain, gait problem and myalgias.  Skin: Negative for color change, pallor, rash and wound.  Neurological: Negative for dizziness, tremors, syncope, speech difficulty, weakness, numbness and headaches.    Psychiatric/Behavioral: Positive for confusion. Negative for agitation and behavioral problems.  All other systems reviewed and are negative.   Immunization History  Administered Date(s) Administered  . Influenza Split 12/04/2014  . Influenza,inj,Quad PF,6+ Mos 11/11/2015  . Influenza-Unspecified 11/17/2011, 12/04/2014, 10/30/2016  . PPD Test 02/25/2014, 03/11/2014, 04/08/2016  . Pneumococcal Polysaccharide-23 02/11/2008   Pertinent  Health Maintenance Due  Topic Date Due  . FOOT EXAM  07/16/1941  . OPHTHALMOLOGY EXAM  07/16/1941  . URINE MICROALBUMIN  07/16/1941  . PNA vac Low Risk Adult (2 of 2 - PCV13) 02/10/2009  . HEMOGLOBIN A1C  03/08/2015  . INFLUENZA VACCINE  Completed   No flowsheet data found. Functional Status Survey:    Vitals:   01/13/17 0909  BP: (!) 146/61  Pulse: 82  Resp: (!) 22  Temp: (!) 97.4 F (36.3 C)  TempSrc: Oral  SpO2: 95%  Weight: 187 lb 12.8 oz (85.2 kg)  Height: _0  (1.676 m)   Body mass index is 30.31 kg/m. Physical Exam  Constitutional: Vital signs are normal. He appears well-developed and well-nourished. He is active and cooperative. He does not appear ill. No distress.  HENT:  Head: Normocephalic and atraumatic.  Mouth/Throat: Uvula is midline, oropharynx is clear and moist and mucous membranes are normal. Mucous membranes are not pale, not dry and not cyanotic.  Eyes: Conjunctivae, EOM and lids are normal. Pupils are equal, round, and reactive to light.  Neck: Trachea normal, normal range of motion and full passive range of motion without pain. Neck supple. No JVD present. No tracheal deviation, no edema and no erythema present. No thyromegaly present.  Cardiovascular: Normal rate, regular rhythm, normal heart sounds, intact distal pulses and normal pulses. Exam reveals no gallop, no distant heart sounds and no friction rub.  No murmur heard. Pulses:      Dorsalis pedis pulses are 2+ on the right side, and 2+ on the left side.   1-2 + chronic BLE edema  Pulmonary/Chest: Effort normal and breath sounds normal. No accessory muscle usage. No respiratory distress. He has no decreased breath sounds. He has no wheezes. He has no rhonchi. He has no rales. He exhibits no tenderness.  Abdominal: Soft. Normal appearance and bowel sounds are normal. He exhibits no distension and no ascites. There is no tenderness.  Musculoskeletal: Normal range of motion. He exhibits no edema or tenderness.  Expected  osteoarthritis, stiffness; Bilateral Calves soft, supple. Negative Homan's Sign. B- pedal pulses equal  Neurological: He is alert. He has normal strength. He exhibits abnormal muscle tone. Coordination and gait abnormal.  Dementia, minimally verbal  Skin: Skin is warm, dry and intact. He is not diaphoretic. No cyanosis. No pallor. Nails show no clubbing.  Psychiatric: His speech is normal and behavior is normal. Judgment and thought content normal. His affect is blunt. Cognition and memory are impaired. He exhibits abnormal recent memory and abnormal remote memory.  Nursing note and vitals reviewed.   Labs reviewed: Recent Labs    07/18/16 1100 10/29/16 1440 10/30/16 0015 11/14/16 1236  NA 138 139 139 138  K 4.2 4.9 4.9 5.0  CL 102 107 107 101  CO2 _0 GLUCOSE 174* 347* 321* 206*  BUN 35* 40* 45* 55*  CREATININE 1.95* 2.29* 2.35* 2.39*  CALCIUM 9.0 8.9 8.8* 9.1  MG 2.2  --   --   --    Recent Labs    07/18/16 1100 10/29/16 1440 11/14/16 1236  AST 46* 23 21  ALT 80* 15* 17  ALKPHOS 83 61 76  BILITOT 0.6 0.7 0.7  PROT 7.5 7.0 7.6  ALBUMIN 3.2* 3.0* 3.2*   Recent Labs    07/18/16 1100 10/29/16 1440 11/14/16 1236  WBC 4.7 5.8 9.6  NEUTROABS 3.0 3.9 7.8*  HGB 10.3* 9.6* 9.4*  HCT 30.5* 28.2* 27.0*  MCV 93.9 93.1 94.9  PLT 219 215 225   Lab Results  Component Value Date   TSH 4.410 07/18/2016   Lab Results  Component Value Date   HGBA1C 9.5 (H) 09/05/2014   Lab Results  Component Value  Date   CHOL 177 07/18/2016   HDL 34 (L) 07/18/2016   LDLCALC 122 (H) 07/18/2016   TRIG 105 07/18/2016   CHOLHDL 5.2 07/18/2016    Significant Diagnostic Results in last 30 days:  No results found.  Assessment/Plan William Byrd was seen today for medical management of chronic issues.  Diagnoses and all orders for this visit:  Hypertensive heart disease with heart failure ([place])  Essential hypertension  Chronic embolism and thrombosis of unspecified deep veins of left lower extremity (HCC)   Above conditions stable  Continue current medication regimen  Check routine labs before next visit  Continue to encourage pt to be out of the room and interacting with other residents as much as tolerable  OOB to chair as much as tolerable  Follow up with Cardiologist as needed  Family/ staff Communication:   Total Time:  Documentation:  Face to Face:  Family/Phone:   Labs/tests ordered:  Cbc, met c, tsh, B12, D, Mag+, lipid panel, A1c prior to next assessment  Medication list reviewed and assessed for continued appropriateness. Monthly medication orders reviewed and signed.  Vikki Ports, NP-C Geriatrics [facility] [phone], [place] [address] Cell Phone (Mon-Fri 8am-5pm):  [phone] On Call:  [phone] & follow prompts after 5pm & weekends Office Phone:  [phone] Office Fax:  [phone]

## 2017-02-27 NOTE — Assessment & Plan Note (Signed)
Unstable. Staff reports pt is having increased frequency/ episodes of coughing with PO intake. Intermittent times when pt is having to be fed for safety. Pt is currently on NAS/NCS diet with thin liquids. Will have SLP evaluate pt.

## 2017-03-03 ENCOUNTER — Other Ambulatory Visit
Admission: RE | Admit: 2017-03-03 | Discharge: 2017-03-03 | Disposition: A | Discharge status: Home / Self Care | Payer: Medicare HMO | Source: Ambulatory Visit | Attending: Gerontology | Admitting: Gerontology

## 2017-03-03 DIAGNOSIS — I130 Hypertensive heart and chronic kidney disease with heart failure and stage 1 through stage 4 chronic kidney disease, or unspecified chronic kidney disease: Secondary | ICD-10-CM | POA: Insufficient documentation

## 2017-03-03 LAB — COMPREHENSIVE METABOLIC PANEL
ALK PHOS: 67 U/L (ref 38–126)
ALT: 13 U/L — AB (ref 17–63)
AST: 16 U/L (ref 15–41)
Albumin: 2.9 g/dL — ABNORMAL LOW (ref 3.5–5.0)
Anion gap: 9 (ref 5–15)
BUN: 81 mg/dL — AB (ref 6–20)
CALCIUM: 8.7 mg/dL — AB (ref 8.9–10.3)
CO2: 26 mmol/L (ref 22–32)
CREATININE: 3.32 mg/dL — AB (ref 0.61–1.24)
Chloride: 105 mmol/L (ref 101–111)
GFR, EST AFRICAN AMERICAN: 18 mL/min — AB (ref >60)
GFR, EST NON AFRICAN AMERICAN: 16 mL/min — AB (ref >60)
Glucose, Bld: 169 mg/dL — ABNORMAL HIGH (ref 65–99)
Potassium: 4.4 mmol/L (ref 3.5–5.1)
Sodium: 140 mmol/L (ref 135–145)
Total Bilirubin: 0.7 mg/dL (ref 0.3–1.2)
Total Protein: 7.1 g/dL (ref 6.5–8.1)

## 2017-03-03 LAB — VITAMIN B12: VITAMIN B 12: 1131 pg/mL — AB (ref 180–914)

## 2017-03-03 LAB — CBC WITH DIFFERENTIAL/PLATELET
BASOS PCT: 0 %
Basophils Absolute: 0 10*3/uL (ref 0–0.1)
EOS ABS: 0.2 10*3/uL (ref 0–0.7)
EOS PCT: 6 %
HCT: 25.1 % — ABNORMAL LOW (ref 40.0–52.0)
Hemoglobin: 8.2 g/dL — ABNORMAL LOW (ref 13.0–18.0)
LYMPHS PCT: 24 %
Lymphs Abs: 1 10*3/uL (ref 1.0–3.6)
MCH: 30.1 pg (ref 26.0–34.0)
MCHC: 32.6 g/dL (ref 32.0–36.0)
MCV: 92.4 fL (ref 80.0–100.0)
Monocytes Absolute: 0.5 10*3/uL (ref 0.2–1.0)
Monocytes Relative: 13 %
Neutro Abs: 2.4 10*3/uL (ref 1.4–6.5)
Neutrophils Relative %: 57 %
PLATELETS: 237 10*3/uL (ref 150–440)
RBC: 2.71 MIL/uL — AB (ref 4.40–5.90)
RDW: 16.3 % — ABNORMAL HIGH (ref 11.5–14.5)
WBC: 4.1 10*3/uL (ref 3.8–10.6)

## 2017-03-03 LAB — TSH: TSH: 3.348 u[IU]/mL (ref 0.350–4.500)

## 2017-03-03 LAB — LIPID PANEL
CHOL/HDL RATIO: 7.7 ratio
CHOLESTEROL: 199 mg/dL (ref 0–200)
HDL: 26 mg/dL — AB (ref >40)
LDL Cholesterol: 137 mg/dL — ABNORMAL HIGH (ref 0–99)
TRIGLYCERIDES: 179 mg/dL — AB (ref <150)
VLDL: 36 mg/dL (ref 0–40)

## 2017-03-03 LAB — MAGNESIUM: Magnesium: 3.1 mg/dL — ABNORMAL HIGH (ref 1.7–2.4)

## 2017-03-04 LAB — VITAMIN D 25 HYDROXY (VIT D DEFICIENCY, FRACTURES): Vit D, 25-Hydroxy: 33.6 ng/mL (ref 30.0–100.0)

## 2017-03-04 LAB — HEMOGLOBIN A1C
HEMOGLOBIN A1C: 8.5 % — AB (ref 4.8–5.6)
Mean Plasma Glucose: 197.25 mg/dL

## 2017-03-10 ENCOUNTER — Non-Acute Institutional Stay (SKILLED_NURSING_FACILITY): Payer: Medicare HMO | Admitting: Gerontology

## 2017-03-10 DIAGNOSIS — J181 Lobar pneumonia, unspecified organism: Secondary | ICD-10-CM

## 2017-03-10 DIAGNOSIS — J189 Pneumonia, unspecified organism: Secondary | ICD-10-CM

## 2017-03-11 ENCOUNTER — Inpatient Hospital Stay
Admission: EM | Admit: 2017-03-11 | Discharge: 2017-03-18 | DRG: 870 | Disposition: A | Discharge status: Skilled Nursing Facility | Payer: Medicare HMO | Attending: Internal Medicine | Admitting: Internal Medicine

## 2017-03-11 ENCOUNTER — Other Ambulatory Visit: Payer: Self-pay

## 2017-03-11 ENCOUNTER — Emergency Department: Payer: Medicare HMO

## 2017-03-11 DIAGNOSIS — Z9911 Dependence on respirator [ventilator] status: Secondary | ICD-10-CM

## 2017-03-11 DIAGNOSIS — R6521 Severe sepsis with septic shock: Secondary | ICD-10-CM | POA: Diagnosis present

## 2017-03-11 DIAGNOSIS — Z981 Arthrodesis status: Secondary | ICD-10-CM | POA: Diagnosis not present

## 2017-03-11 DIAGNOSIS — D631 Anemia in chronic kidney disease: Secondary | ICD-10-CM | POA: Diagnosis present

## 2017-03-11 DIAGNOSIS — I5022 Chronic systolic (congestive) heart failure: Secondary | ICD-10-CM | POA: Diagnosis present

## 2017-03-11 DIAGNOSIS — Z515 Encounter for palliative care: Secondary | ICD-10-CM | POA: Diagnosis not present

## 2017-03-11 DIAGNOSIS — N179 Acute kidney failure, unspecified: Secondary | ICD-10-CM | POA: Diagnosis present

## 2017-03-11 DIAGNOSIS — R402112 Coma scale, eyes open, never, at arrival to emergency department: Secondary | ICD-10-CM | POA: Diagnosis present

## 2017-03-11 DIAGNOSIS — Z8673 Personal history of transient ischemic attack (TIA), and cerebral infarction without residual deficits: Secondary | ICD-10-CM | POA: Diagnosis not present

## 2017-03-11 DIAGNOSIS — Z7401 Bed confinement status: Secondary | ICD-10-CM

## 2017-03-11 DIAGNOSIS — E11319 Type 2 diabetes mellitus with unspecified diabetic retinopathy without macular edema: Secondary | ICD-10-CM | POA: Diagnosis present

## 2017-03-11 DIAGNOSIS — Z7901 Long term (current) use of anticoagulants: Secondary | ICD-10-CM

## 2017-03-11 DIAGNOSIS — F0280 Dementia in other diseases classified elsewhere without behavioral disturbance: Secondary | ICD-10-CM | POA: Diagnosis present

## 2017-03-11 DIAGNOSIS — E1122 Type 2 diabetes mellitus with diabetic chronic kidney disease: Secondary | ICD-10-CM | POA: Diagnosis present

## 2017-03-11 DIAGNOSIS — K219 Gastro-esophageal reflux disease without esophagitis: Secondary | ICD-10-CM | POA: Diagnosis present

## 2017-03-11 DIAGNOSIS — R402212 Coma scale, best verbal response, none, at arrival to emergency department: Secondary | ICD-10-CM | POA: Diagnosis present

## 2017-03-11 DIAGNOSIS — E870 Hyperosmolality and hypernatremia: Secondary | ICD-10-CM | POA: Diagnosis present

## 2017-03-11 DIAGNOSIS — Z888 Allergy status to other drugs, medicaments and biological substances status: Secondary | ICD-10-CM

## 2017-03-11 DIAGNOSIS — Z7189 Other specified counseling: Secondary | ICD-10-CM

## 2017-03-11 DIAGNOSIS — L8990 Pressure ulcer of unspecified site, unspecified stage: Secondary | ICD-10-CM

## 2017-03-11 DIAGNOSIS — H409 Unspecified glaucoma: Secondary | ICD-10-CM | POA: Diagnosis present

## 2017-03-11 DIAGNOSIS — E875 Hyperkalemia: Secondary | ICD-10-CM | POA: Diagnosis present

## 2017-03-11 DIAGNOSIS — R609 Edema, unspecified: Secondary | ICD-10-CM

## 2017-03-11 DIAGNOSIS — Z87891 Personal history of nicotine dependence: Secondary | ICD-10-CM

## 2017-03-11 DIAGNOSIS — N189 Chronic kidney disease, unspecified: Secondary | ICD-10-CM | POA: Diagnosis not present

## 2017-03-11 DIAGNOSIS — A419 Sepsis, unspecified organism: Principal | ICD-10-CM | POA: Diagnosis present

## 2017-03-11 DIAGNOSIS — Z95828 Presence of other vascular implants and grafts: Secondary | ICD-10-CM

## 2017-03-11 DIAGNOSIS — G309 Alzheimer's disease, unspecified: Secondary | ICD-10-CM | POA: Diagnosis present

## 2017-03-11 DIAGNOSIS — J9621 Acute and chronic respiratory failure with hypoxia: Secondary | ICD-10-CM | POA: Diagnosis not present

## 2017-03-11 DIAGNOSIS — R1311 Dysphagia, oral phase: Secondary | ICD-10-CM | POA: Diagnosis present

## 2017-03-11 DIAGNOSIS — G9340 Encephalopathy, unspecified: Secondary | ICD-10-CM | POA: Diagnosis not present

## 2017-03-11 DIAGNOSIS — Z8261 Family history of arthritis: Secondary | ICD-10-CM

## 2017-03-11 DIAGNOSIS — Z66 Do not resuscitate: Secondary | ICD-10-CM | POA: Diagnosis present

## 2017-03-11 DIAGNOSIS — I130 Hypertensive heart and chronic kidney disease with heart failure and stage 1 through stage 4 chronic kidney disease, or unspecified chronic kidney disease: Secondary | ICD-10-CM | POA: Diagnosis present

## 2017-03-11 DIAGNOSIS — J189 Pneumonia, unspecified organism: Secondary | ICD-10-CM | POA: Diagnosis present

## 2017-03-11 DIAGNOSIS — I2510 Atherosclerotic heart disease of native coronary artery without angina pectoris: Secondary | ICD-10-CM | POA: Diagnosis present

## 2017-03-11 DIAGNOSIS — J9690 Respiratory failure, unspecified, unspecified whether with hypoxia or hypercapnia: Secondary | ICD-10-CM

## 2017-03-11 DIAGNOSIS — N184 Chronic kidney disease, stage 4 (severe): Secondary | ICD-10-CM | POA: Diagnosis present

## 2017-03-11 DIAGNOSIS — J9601 Acute respiratory failure with hypoxia: Secondary | ICD-10-CM | POA: Diagnosis present

## 2017-03-11 DIAGNOSIS — M1990 Unspecified osteoarthritis, unspecified site: Secondary | ICD-10-CM | POA: Diagnosis present

## 2017-03-11 DIAGNOSIS — Z833 Family history of diabetes mellitus: Secondary | ICD-10-CM

## 2017-03-11 DIAGNOSIS — R402312 Coma scale, best motor response, none, at arrival to emergency department: Secondary | ICD-10-CM | POA: Diagnosis present

## 2017-03-11 DIAGNOSIS — J690 Pneumonitis due to inhalation of food and vomit: Secondary | ICD-10-CM | POA: Diagnosis not present

## 2017-03-11 DIAGNOSIS — E1151 Type 2 diabetes mellitus with diabetic peripheral angiopathy without gangrene: Secondary | ICD-10-CM | POA: Diagnosis present

## 2017-03-11 DIAGNOSIS — E1165 Type 2 diabetes mellitus with hyperglycemia: Secondary | ICD-10-CM | POA: Diagnosis present

## 2017-03-11 DIAGNOSIS — E871 Hypo-osmolality and hyponatremia: Secondary | ICD-10-CM | POA: Diagnosis present

## 2017-03-11 DIAGNOSIS — E1142 Type 2 diabetes mellitus with diabetic polyneuropathy: Secondary | ICD-10-CM | POA: Diagnosis present

## 2017-03-11 DIAGNOSIS — R140 Abdominal distension (gaseous): Secondary | ICD-10-CM

## 2017-03-11 DIAGNOSIS — Z800 Family history of malignant neoplasm of digestive organs: Secondary | ICD-10-CM

## 2017-03-11 DIAGNOSIS — E7800 Pure hypercholesterolemia, unspecified: Secondary | ICD-10-CM | POA: Diagnosis present

## 2017-03-11 DIAGNOSIS — Z794 Long term (current) use of insulin: Secondary | ICD-10-CM

## 2017-03-11 DIAGNOSIS — G9341 Metabolic encephalopathy: Secondary | ICD-10-CM | POA: Diagnosis not present

## 2017-03-11 DIAGNOSIS — K5641 Fecal impaction: Secondary | ICD-10-CM | POA: Diagnosis not present

## 2017-03-11 DIAGNOSIS — F0390 Unspecified dementia without behavioral disturbance: Secondary | ICD-10-CM | POA: Diagnosis not present

## 2017-03-11 DIAGNOSIS — E878 Other disorders of electrolyte and fluid balance, not elsewhere classified: Secondary | ICD-10-CM | POA: Diagnosis not present

## 2017-03-11 DIAGNOSIS — M48061 Spinal stenosis, lumbar region without neurogenic claudication: Secondary | ICD-10-CM | POA: Diagnosis present

## 2017-03-11 LAB — COMPREHENSIVE METABOLIC PANEL
ALBUMIN: 3 g/dL — AB (ref 3.5–5.0)
ALK PHOS: 80 U/L (ref 38–126)
ALT: 27 U/L (ref 17–63)
AST: 36 U/L (ref 15–41)
Anion gap: 14 (ref 5–15)
BILIRUBIN TOTAL: 0.7 mg/dL (ref 0.3–1.2)
BUN: 92 mg/dL — ABNORMAL HIGH (ref 6–20)
CALCIUM: 9.1 mg/dL (ref 8.9–10.3)
CO2: 23 mmol/L (ref 22–32)
CREATININE: 4.97 mg/dL — AB (ref 0.61–1.24)
Chloride: 111 mmol/L (ref 101–111)
GFR calc non Af Amer: 10 mL/min — ABNORMAL LOW (ref >60)
GFR, EST AFRICAN AMERICAN: 11 mL/min — AB (ref >60)
GLUCOSE: 127 mg/dL — AB (ref 65–99)
Potassium: 5.3 mmol/L — ABNORMAL HIGH (ref 3.5–5.1)
Sodium: 148 mmol/L — ABNORMAL HIGH (ref 135–145)
TOTAL PROTEIN: 8.5 g/dL — AB (ref 6.5–8.1)

## 2017-03-11 LAB — CBC WITH DIFFERENTIAL/PLATELET
BASOS ABS: 0 10*3/uL (ref 0–0.1)
BASOS PCT: 0 %
EOS ABS: 0 10*3/uL (ref 0–0.7)
Eosinophils Relative: 0 %
HEMATOCRIT: 26.9 % — AB (ref 40.0–52.0)
Hemoglobin: 8.9 g/dL — ABNORMAL LOW (ref 13.0–18.0)
Lymphocytes Relative: 10 %
Lymphs Abs: 0.9 10*3/uL — ABNORMAL LOW (ref 1.0–3.6)
MCH: 30.4 pg (ref 26.0–34.0)
MCHC: 33.2 g/dL (ref 32.0–36.0)
MCV: 91.4 fL (ref 80.0–100.0)
MONO ABS: 0.9 10*3/uL (ref 0.2–1.0)
MONOS PCT: 10 %
Neutro Abs: 7.1 10*3/uL — ABNORMAL HIGH (ref 1.4–6.5)
Neutrophils Relative %: 80 %
PLATELETS: 285 10*3/uL (ref 150–440)
RBC: 2.94 MIL/uL — ABNORMAL LOW (ref 4.40–5.90)
RDW: 18 % — ABNORMAL HIGH (ref 11.5–14.5)
WBC: 8.9 10*3/uL (ref 3.8–10.6)

## 2017-03-11 LAB — PROTIME-INR
INR: 2.33
PROTHROMBIN TIME: 25.4 s — AB (ref 11.4–15.2)

## 2017-03-11 LAB — URINALYSIS, ROUTINE W REFLEX MICROSCOPIC
BILIRUBIN URINE: NEGATIVE
Bacteria, UA: NONE SEEN
GLUCOSE, UA: NEGATIVE mg/dL
HGB URINE DIPSTICK: NEGATIVE
Ketones, ur: NEGATIVE mg/dL
LEUKOCYTES UA: NEGATIVE
NITRITE: NEGATIVE
PH: 5 (ref 5.0–8.0)
Protein, ur: 30 mg/dL — AB
SPECIFIC GRAVITY, URINE: 1.013 (ref 1.005–1.030)

## 2017-03-11 LAB — CREATININE, SERUM
CREATININE: 4.6 mg/dL — AB (ref 0.61–1.24)
GFR, EST AFRICAN AMERICAN: 12 mL/min — AB (ref >60)
GFR, EST NON AFRICAN AMERICAN: 11 mL/min — AB (ref >60)

## 2017-03-11 LAB — LACTIC ACID, PLASMA
LACTIC ACID, VENOUS: 1.9 mmol/L (ref 0.5–1.9)
Lactic Acid, Venous: 2.2 mmol/L (ref 0.5–1.9)

## 2017-03-11 LAB — MRSA PCR SCREENING: MRSA BY PCR: NEGATIVE

## 2017-03-11 LAB — GLUCOSE, CAPILLARY
GLUCOSE-CAPILLARY: 172 mg/dL — AB (ref 65–99)
Glucose-Capillary: 151 mg/dL — ABNORMAL HIGH (ref 65–99)
Glucose-Capillary: 148 mg/dL — ABNORMAL HIGH (ref 65–99)

## 2017-03-11 LAB — TRIGLYCERIDES
TRIGLYCERIDES: 134 mg/dL (ref <150)
Triglycerides: 128 mg/dL (ref <150)

## 2017-03-11 LAB — TROPONIN I: Troponin I: 0.03 ng/mL (ref <0.03)

## 2017-03-11 LAB — APTT: aPTT: 39 seconds — ABNORMAL HIGH (ref 24–36)

## 2017-03-11 LAB — PROCALCITONIN: Procalcitonin: 41.7 ng/mL

## 2017-03-11 MED ORDER — ACETAMINOPHEN 325 MG PO TABS
650.0000 mg | ORAL_TABLET | Freq: Four times a day (QID) | ORAL | Status: DC | PRN
  Administered 2017-03-11: 650 mg
  Filled 2017-03-11: qty 2

## 2017-03-11 MED ORDER — SODIUM CHLORIDE 0.9 % IV SOLN
INTRAVENOUS | Status: DC
  Administered 2017-03-11: 17:00:00 via INTRAVENOUS

## 2017-03-11 MED ORDER — PROPOFOL 1000 MG/100ML IV EMUL
0.0000 ug/kg/min | INTRAVENOUS | Status: DC
  Administered 2017-03-12: 15 ug/kg/min via INTRAVENOUS
  Administered 2017-03-12 (×2): 20 ug/kg/min via INTRAVENOUS
  Administered 2017-03-13 (×2): 30 ug/kg/min via INTRAVENOUS
  Administered 2017-03-14: 20 ug/kg/min via INTRAVENOUS
  Administered 2017-03-14: 8 ug/kg/min via INTRAVENOUS
  Filled 2017-03-11 (×8): qty 100

## 2017-03-11 MED ORDER — SODIUM CHLORIDE 0.9 % IV BOLUS (SEPSIS): 500.0000 mL | Freq: Once | INTRAVENOUS | Status: DC

## 2017-03-11 MED ORDER — CHLORHEXIDINE GLUCONATE 0.12% ORAL RINSE (MEDLINE KIT)
15.0000 mL | Freq: Two times a day (BID) | OROMUCOSAL | Status: DC
  Administered 2017-03-11 – 2017-03-18 (×14): 15 mL via OROMUCOSAL

## 2017-03-11 MED ORDER — SODIUM CHLORIDE 0.9 % IV BOLUS (SEPSIS)
1000.0000 mL | Freq: Once | INTRAVENOUS | Status: AC
  Administered 2017-03-11: 1000 mL via INTRAVENOUS

## 2017-03-11 MED ORDER — ACETAMINOPHEN 650 MG RE SUPP: 650.0000 mg | Freq: Four times a day (QID) | RECTAL | Status: DC | PRN

## 2017-03-11 MED ORDER — DEXTROSE 5 % IV SOLN
2.0000 g | Freq: Once | INTRAVENOUS | Status: AC
  Administered 2017-03-11: 2 g via INTRAVENOUS
  Filled 2017-03-11: qty 2

## 2017-03-11 MED ORDER — PROPOFOL 10 MG/ML IV BOLUS
INTRAVENOUS | Status: AC
  Filled 2017-03-11: qty 20

## 2017-03-11 MED ORDER — ACETAMINOPHEN 325 MG PO TABS: 650.0000 mg | ORAL_TABLET | Freq: Four times a day (QID) | ORAL | Status: DC | PRN

## 2017-03-11 MED ORDER — VANCOMYCIN HCL IN DEXTROSE 1-5 GM/200ML-% IV SOLN
1000.0000 mg | Freq: Once | INTRAVENOUS | Status: AC
  Administered 2017-03-11: 1000 mg via INTRAVENOUS
  Filled 2017-03-11: qty 200

## 2017-03-11 MED ORDER — PROPOFOL 1000 MG/100ML IV EMUL
5.0000 ug/kg/min | Freq: Once | INTRAVENOUS | Status: AC
  Administered 2017-03-11: 5 ug/kg/min via INTRAVENOUS

## 2017-03-11 MED ORDER — ACETAMINOPHEN 650 MG RE SUPP
650.0000 mg | Freq: Once | RECTAL | Status: AC
  Administered 2017-03-11: 650 mg via RECTAL

## 2017-03-11 MED ORDER — ONDANSETRON HCL 4 MG/2ML IJ SOLN: 4.0000 mg | Freq: Four times a day (QID) | INTRAMUSCULAR | Status: DC | PRN

## 2017-03-11 MED ORDER — ALBUTEROL SULFATE (2.5 MG/3ML) 0.083% IN NEBU: 2.5000 mg | INHALATION_SOLUTION | RESPIRATORY_TRACT | Status: DC | PRN

## 2017-03-11 MED ORDER — HEPARIN SODIUM (PORCINE) 5000 UNIT/ML IJ SOLN
5000.0000 [IU] | Freq: Three times a day (TID) | INTRAMUSCULAR | Status: DC
  Administered 2017-03-11 – 2017-03-18 (×20): 5000 [IU] via SUBCUTANEOUS
  Filled 2017-03-11 (×20): qty 1

## 2017-03-11 MED ORDER — FAMOTIDINE IN NACL 20-0.9 MG/50ML-% IV SOLN
20.0000 mg | INTRAVENOUS | Status: DC
  Administered 2017-03-11 – 2017-03-17 (×7): 20 mg via INTRAVENOUS
  Filled 2017-03-11 (×8): qty 50

## 2017-03-11 MED ORDER — INSULIN ASPART 100 UNIT/ML ~~LOC~~ SOLN
0.0000 [IU] | SUBCUTANEOUS | Status: DC
  Administered 2017-03-11: 3 [IU] via SUBCUTANEOUS
  Administered 2017-03-11: 2 [IU] via SUBCUTANEOUS
  Administered 2017-03-12: 3 [IU] via SUBCUTANEOUS
  Administered 2017-03-12: 5 [IU] via SUBCUTANEOUS
  Administered 2017-03-12: 3 [IU] via SUBCUTANEOUS
  Administered 2017-03-12: 2 [IU] via SUBCUTANEOUS
  Administered 2017-03-13 (×2): 3 [IU] via SUBCUTANEOUS
  Administered 2017-03-13: 5 [IU] via SUBCUTANEOUS
  Administered 2017-03-13 (×2): 3 [IU] via SUBCUTANEOUS
  Administered 2017-03-14: 5 [IU] via SUBCUTANEOUS
  Administered 2017-03-14: 3 [IU] via SUBCUTANEOUS
  Administered 2017-03-14: 8 [IU] via SUBCUTANEOUS
  Administered 2017-03-14 (×2): 3 [IU] via SUBCUTANEOUS
  Administered 2017-03-14: 2 [IU] via SUBCUTANEOUS
  Administered 2017-03-15 – 2017-03-16 (×8): 3 [IU] via SUBCUTANEOUS
  Administered 2017-03-16: 2 [IU] via SUBCUTANEOUS
  Administered 2017-03-16 (×3): 3 [IU] via SUBCUTANEOUS
  Administered 2017-03-17: 12:00:00 1 [IU] via SUBCUTANEOUS
  Administered 2017-03-17 (×4): 2 [IU] via SUBCUTANEOUS
  Administered 2017-03-18: 13:00:00 3 [IU] via SUBCUTANEOUS
  Administered 2017-03-18: 2 [IU] via SUBCUTANEOUS
  Filled 2017-03-11 (×36): qty 1

## 2017-03-11 MED ORDER — ORAL CARE MOUTH RINSE
15.0000 mL | OROMUCOSAL | Status: DC
  Administered 2017-03-11 – 2017-03-16 (×50): 15 mL via OROMUCOSAL

## 2017-03-11 MED ORDER — ONDANSETRON HCL 4 MG PO TABS: 4.0000 mg | ORAL_TABLET | Freq: Four times a day (QID) | ORAL | Status: DC | PRN

## 2017-03-11 MED ORDER — ETOMIDATE 2 MG/ML IV SOLN
10.0000 mg | Freq: Once | INTRAVENOUS | Status: AC
  Administered 2017-03-11: 10 mg via INTRAVENOUS

## 2017-03-11 MED ORDER — PROPOFOL 1000 MG/100ML IV EMUL: 5.0000 ug/kg/min | INTRAVENOUS | Status: DC

## 2017-03-11 MED ORDER — ROCURONIUM BROMIDE 50 MG/5ML IV SOLN
100.0000 mg | Freq: Once | INTRAVENOUS | Status: AC
  Administered 2017-03-11: 100 mg via INTRAVENOUS

## 2017-03-11 MED ORDER — FENTANYL CITRATE (PF) 100 MCG/2ML IJ SOLN
50.0000 ug | INTRAMUSCULAR | Status: DC | PRN
  Administered 2017-03-12 – 2017-03-14 (×4): 50 ug via INTRAVENOUS
  Filled 2017-03-11 (×4): qty 2

## 2017-03-11 MED ORDER — ACETAMINOPHEN 650 MG RE SUPP
RECTAL | Status: AC
  Administered 2017-03-11: 650 mg via RECTAL
  Filled 2017-03-11: qty 1

## 2017-03-11 MED ORDER — PROPOFOL 1000 MG/100ML IV EMUL
INTRAVENOUS | Status: AC
  Filled 2017-03-11: qty 100

## 2017-03-11 MED ORDER — CEFEPIME HCL 2 G IJ SOLR
2.0000 g | INTRAMUSCULAR | Status: DC
  Administered 2017-03-12 – 2017-03-18 (×7): 2 g via INTRAVENOUS
  Filled 2017-03-11 (×8): qty 2

## 2017-03-11 MED ORDER — VANCOMYCIN HCL 10 G IV SOLR
1250.0000 mg | INTRAVENOUS | Status: DC
  Administered 2017-03-11: 1250 mg via INTRAVENOUS
  Filled 2017-03-11 (×2): qty 1250

## 2017-03-11 NOTE — Progress Notes (Signed)
Pharmacy Antibiotic Note  William Byrd is a 84 y.o. male admitted on 03/11/2017 with pneumonia.  Pharmacy has been consulted for cefepime and vancomycin dosing.  Plan: Vancomycin 1250mg   IV every 48h hours.  Goal trough 15-20 mcg/mL. cefepime 2gm iv q24h  Vancomycin trough before 3rd dose, 2/3 @ 1830  Weight: 176 lb (79.8 kg)  Temp (24hrs), Avg:97.1 F (36.2 C), Min:97.1 F (36.2 C), Max:97.1 F (36.2 C)  Recent Labs  Lab 03/11/17 1201  WBC 8.9    Estimated Creatinine Clearance: 16.7 mL/min (A) (by C-G formula based on SCr of 3.32 mg/dL (H)).    Allergies  Allergen Reactions  . Fosinopril Other (See Comments)    Other reaction(s): Other (See Comments) Hyperkalemia Reaction: Hyperkalemia     Antimicrobials this admission: Anti-infectives (From admission, onward)   Start     Dose/Rate Route Frequency Ordered Stop   03/12/17 1100  ceFEPIme (MAXIPIME) 2 g in dextrose 5 % 50 mL IVPB     2 g 100 mL/hr over 30 Minutes Intravenous Every 24 hours 03/11/17 1229     03/11/17 1900  vancomycin (VANCOCIN) 1,250 mg in sodium chloride 0.9 % 250 mL IVPB     1,250 mg 166.7 mL/hr over 90 Minutes Intravenous Every 48 hours 03/11/17 1233     03/11/17 1215  ceFEPIme (MAXIPIME) 2 g in dextrose 5 % 50 mL IVPB     2 g 100 mL/hr over 30 Minutes Intravenous  Once 03/11/17 1203     03/11/17 1215  vancomycin (VANCOCIN) IVPB 1000 mg/200 mL premix     1,000 mg 200 mL/hr over 60 Minutes Intravenous  Once 03/11/17 1203         Microbiology results: No results found for this or any previous visit (from the past 240 hour(s)).   Thank you for allowing pharmacy to be a part of this patient's care.  Gerre PebblesGarrett Jackson Fetters 03/11/2017 12:33 PM

## 2017-03-11 NOTE — ED Triage Notes (Signed)
[facility]. EMS states staff tried to feet pt pudding with meds and it appears that pt aspirated. Gurgling noises from pt. Pt mouth suctioned upon arrival. Pt abd hard and distended. Staff stated that pt is being treated for pneumonia, unknown how long he has been on antibiotics. Axillary temp of 101 with EMS. CBG 130, 110/68, sinus tach of 110. Pt was 80% RA with EMS, placed on non-rebreather and came up to 96%. Pt unresponsive to painful stimuli upon arrival.

## 2017-03-11 NOTE — ED Notes (Signed)
Attempted to obtain second set of cultures. Unable to draw from lines or straight stick at this time.

## 2017-03-11 NOTE — ED Provider Notes (Signed)
[facility] Emergency Department Provider Note  Time seen: 12:04 PM  I have reviewed the triage vital signs and the nursing notes.   HISTORY  Chief Complaint Pneumonia    HPI William Byrd is a 84 y.o. male with a past medical history of dementia, CHF, hypertension, presents to the emergency department from his rehab nursing facility for unresponsiveness.  According to EMS they state for the past 3 days patient has been very somnolent and largely nonresponsive.  Today he was noted to have a fever of 101, continued to be unresponsive unable to take antibiotics due to unresponsiveness of this and the patient to the emergency department for evaluation.  Here the patient presents on a nonrebreather mask satting in the low to mid 90s.  64% on 2 L via nasal cannula.  Patient has noisy respirations with upper respiratory secretions, nonresponsive to painful stimuli including sternal rub.  Past Medical History:  Diagnosis Date  . Arthritis   . Background retinopathy   . Cervicalgia   . Chronic kidney disease    follow up with a Dr.   . Chronic systolic CHF (congestive heart failure) (HCC)   . Congenital heart failure (HCC)   . Degeneration of lumbar or lumbosacral intervertebral disc   . Dementia   . Diabetes mellitus type 2, uncomplicated (HCC) 1996   on insulin since 2011, last eye exam with Dr. Inez PilgrimBrasington 07/2013  . Diabetes mellitus without complication (HCC)   . DVT (deep venous thrombosis) (HCC)   . Essential hypertension    unspecified  . Generalized weakness   . Glaucoma   . Hypercholesteremia   . Hypertension   . Long-term insulin use (HCC)   . Moderate dementia   . Peripheral polyneuropathy   . Peripheral vascular disease (HCC)   . Pneumonia, unspecified organism 04/01/2014  . Pure hypercholesterolemia   . Spinal stenosis   . Spinal stenosis, lumbar region without neurogenic claudication     Patient Active Problem List   Diagnosis Date Noted   . Dementia in other diseases classified elsewhere without behavioral disturbance 06/20/2016  . Other intervertebral disc degeneration, lumbar region 06/20/2016  . Type 2 diabetes mellitus with unspecified diabetic retinopathy without macular edema (HCC) 06/20/2016  . Type 2 diabetes mellitus with other diabetic ophthalmic complication (HCC) 06/20/2016  . Glaucoma 06/20/2016  . Type 2 diabetes mellitus with diabetic chronic kidney disease (HCC) 06/20/2016  . Type 2 diabetes mellitus with diabetic polyneuropathy (HCC) 06/20/2016  . 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 Hypertensive heart disease with heart failure (HCC) 06/20/2016  . Major depression, single episode 06/20/2016  . Chronic systolic (congestive) heart failure (HCC) 06/20/2016  . Benign prostatic hyperplasia without lower urinary tract symptoms 06/20/2016  . Pure hypercholesterolemia, unspecified 06/20/2016  . Gastroesophageal reflux disease without esophagitis 06/20/2016  . Dysphagia, oral phase 06/20/2016  . Dermatitis associated with moisture 06/20/2016  . CVA (cerebral infarction) 07/15/2015  . HTN (hypertension) 09/05/2014  . Alzheimer's disease 09/05/2014  . Chronic kidney disease, stage III (moderate) (HCC) 09/05/2014    Past Surgical History:  Procedure Laterality Date  . ANGIOPLASTY / STENTING FEMORAL  1999  . ANTERIOR FUSION CERVICAL SPINE  07/2009  . c-spine fusion N/A   . COLONOSCOPY  05/23/2004   Dr. Maryruth BunKapur, normal, PH colon polpys    Prior to Admission medications   Medication Sig Start Date End Date Taking? Authorizing Provider  acetaminophen (TYLENOL) 325 MG tablet  Take 650 mg by mouth every 6 (six) hours as needed for mild pain, moderate pain, fever or headache.    [provider]  albuterol (PROVENTIL HFA;VENTOLIN HFA) 108 (90 Base) MCG/ACT inhaler Inhale 2 puffs into the lungs every 4 (four) hours as needed for wheezing or  shortness of breath.    [provider]  amLODipine (NORVASC) 10 MG tablet Take 1 tablet (10 mg total) by mouth daily. 11/15/15   Altamese Dilling, MD  benzonatate (TESSALON) 100 MG capsule Take 200 mg by mouth every 8 (eight) hours as needed for cough.    [provider]  bisacodyl (DULCOLAX) 5 MG EC tablet Take 1 tablet (5 mg total) by mouth daily as needed for moderate constipation. 11/14/15   Altamese Dilling, MD  cholecalciferol (VITAMIN D) 1000 UNITS tablet Take 1,000 Units by mouth daily.    [provider]  docusate sodium (STOOL SOFTENER) 100 MG capsule Take 100 mg by mouth daily as needed for mild constipation or moderate constipation.     [provider]  donepezil (ARICEPT) 10 MG tablet Take 10 mg by mouth at bedtime.  07/05/14   [provider]  DULoxetine (CYMBALTA) 20 MG capsule Take 20 mg by mouth daily.    [provider]  finasteride (PROSCAR) 5 MG tablet Take 5 mg by mouth daily. *staff : do not handle without gloves* 07/19/14   [provider]  furosemide (LASIX) 20 MG tablet Take 2 tablets (40 mg) by mouth daily each morning. Take an additional 1 tablet (20 mg) by mouth  6 hours later.    [provider]  glucagon (GLUCAGON EMERGENCY) 1 MG injection Inject 1 mg into the vein once as needed.    [provider]  GLUCERNA Frederica Kuster) LIQD Take 1 Can by mouth daily.     [provider]  HYDROcodone-acetaminophen (NORCO/VICODIN) 5-325 MG tablet Take 1 tablet by mouth every 6 (six) hours as needed for moderate pain. 11/14/15   Altamese Dilling, MD  Insulin Glargine (TOUJEO SOLOSTAR) 300 UNIT/ML SOPN Inject 60 Units into the skin every morning. 8 am     [provider]  insulin regular (NOVOLIN R,HUMULIN R) 100 units/mL injection Inject 3-12 Units into the skin 3 (three) times daily before meals. If BS < 60 call NP/PA, If BS is 175 to 250 give 3 units, 251 to 325 give 6 units, 326  to 450, give 10 units, if BS > 450 give 12 units and call NP/PA, check bs ac&hs, if < 175 document 0 units    [provider]  ipratropium-albuterol (DUONEB) 0.5-2.5 (3) MG/3ML SOLN Take 3 mLs by nebulization 3 (three) times daily.    [provider]  memantine (NAMENDA XR) 28 MG CP24 24 hr capsule Take 28 mg by mouth daily.     [provider]  metoprolol tartrate (LOPRESSOR) 25 MG tablet Take 1 tablet (25 mg total) by mouth 2 (two) times daily. 09/08/14   [name], MD  OXYGEN Inhale 2 L into the lungs as needed. Check o2 sat prior to placing on patient and at least every 4 hours after applying    [provider]  pantoprazole (PROTONIX) 20 MG tablet Take 20 mg by mouth daily.    [provider]  pravastatin (PRAVACHOL) 10 MG tablet Take 10 mg by mouth daily.    [provider]  pregabalin (LYRICA) 75 MG capsule Take 1 capsule (75 mg total) by mouth 2 (two) times daily. 10/20/16  Lorenso Quarry, NP  Probiotic Product (RISA-BID PROBIOTIC) TABS Take 1 tablet by mouth 2 (two) times daily.    [provider]  rivaroxaban (XARELTO) 20 MG TABS tablet Take 1 tablet by mouth daily.  07/18/14   [provider]  sacubitril-valsartan (ENTRESTO) 49-51 MG Take 1 tablet by mouth 2 (two) times daily.    [provider]  senna (SENOKOT) 8.6 MG TABS tablet Take 1 tablet (8.6 mg total) by mouth daily. 11/14/15   Altamese Dilling, MD  tamsulosin (FLOMAX) 0.4 MG CAPS capsule Take 0.4 mg by mouth at bedtime.    [provider]  timolol (TIMOPTIC) 0.5 % ophthalmic solution Place 1 drop into both eyes 2 (two) times daily.     [provider]  Wound Dressings (TRIAD HYDROPHILIC WOUND DRESSI) PSTE Apply cream topically to macerated areas of buttocks daily and prn    [provider]    Allergies  Allergen Reactions  . Fosinopril Other (See Comments)    Other reaction(s): Other (See  Comments) Hyperkalemia Reaction: Hyperkalemia     Family History  Problem Relation Age of Onset  . Diabetes Mother   . Liver cancer Father   . Liver disease Father   . Arthritis Sister     Social History Social History   Tobacco Use  . Smoking status: Former Smoker    Packs/day: 0.50    Years: 46.00    Pack years: 23.00    Types: Cigarettes    Last attempt to quit: 06/11/1998    Years since quitting: 18.7  . Smokeless tobacco: Never Used  Substance Use Topics  . Alcohol use: No  . Drug use: No    Review of Systems Unable to obtain a review of systems secondary to unresponsiveness  ____________________________________________   PHYSICAL EXAM:  Constitutional: Patient sleeping, eyes closed, noisy respirations, unresponsive to painful or verbal stimuli. Eyes: Normal exam ENT   Head: Normocephalic and atraumatic.   Nose: No congestion/rhinnorhea.   Mouth/Throat: Dry mucous membranes Cardiovascular: Normal rate, regular rhythm. Respiratory: Respiratory rate around 20.  Noisy respirations with upper respiratory secretions, mild rhonchi bilaterally. Gastrointestinal: Soft abdomen with mild distention.  Overall tympanic percussion.  No reaction to palpation, but unresponsive. Musculoskeletal: Currently not moving extremities, does not respond to painful stimuli in any extremity. Neurologic: Unresponsive, eyes closed, not responsive to painful or verbal stimuli.  Not moving spontaneously. Skin:  Skin is warm, dry and intact.  Psychiatric: Unresponsive  ____________________________________________    EKG  EKG reviewed and interpreted by myself shows normal sinus rhythm at 97 bpm with a narrow QRS, normal axis, normal intervals, nonspecific ST changes.  No ST elevation.  ____________________________________________    RADIOLOGY  X-ray shows bilateral lower lobe airspace disease.  ____________________________________________   INITIAL IMPRESSION /  ASSESSMENT AND PLAN / ED COURSE  Pertinent labs & imaging results that were available during my care of the patient were reviewed by me and considered in my medical decision making (see chart for details).  Patient presents to the emergency department unresponsive from his nursing/rehab facility.  Differential would include pneumonia, CVA, electrolyte/metabolic abnormality, infectious etiology.  Per report patient has a temperature of 101.7, given his hypoxia with fever we will initiate sepsis protocols.  We will cover for healthcare associated pneumonia.  Currently awaiting lab and x-ray imaging.  Wife is supposedly on her way to the emergency department.  We will wait for the wife as we have no known advance directives for the patient.  EMS  states there was report at the facility of involving hospice or palliative care.  Is not entirely clear what his advanced directives are at this point.  Wife should be here very shortly to make the decision to intubate or not intubate.  In reviewing the patient's records he was last seen in our system 02/25/17 by a nurse practitioner at the nursing home.  At that time he was noted to have noisy respirations, but remained alert and interactive.  Was started on antibiotics for likely pneumonia possible aspiration pneumonia.  Wife is now here with the patient.  States the patient would wish to have everything performed including CPR and intubation.  Given the patient's nonresponsiveness with a GCS less than 8 we will intubate the patient we will start broad-spectrum antibiotics we will admit to the hospital for sepsis likely secondary to pneumonia.   Patient hypotensive 80/53 we will dose 30 mL/kg of fluids, 2.5 L total.  Patient intubated without complication.  X-ray shows bilateral lower lobe airspace disease likely pneumonia given patient presentation.  He will be admitted to the hospital for further treatment.  We will dose PR Tylenol.  INTUBATION Performed by:  Minna Antis  Required items: required blood products, implants, devices, and special equipment available Patient identity confirmed: provided demographic data and hospital-assigned identification number Time out: Immediately prior to procedure a "time out" was called to verify the correct patient, procedure, equipment, support staff and site/side marked as required.  Indications: Airway protection  Intubation method: 4.0 Glidescope Laryngoscopy   Preoxygenation: 100% BVM  Sedatives: 10 mg etomidate Paralytic: 100 mg rocuronium  Tube Size: 8.0 cuffed  Post-procedure assessment: chest rise and ETCO2 monitor Breath sounds: equal and absent over the epigastrium Tube secured with: ETT holder Chest x-ray interpreted by radiologist and me.  Chest x-ray findings: endotracheal tube in appropriate position  Patient tolerated the procedure well with no immediate complications.     CRITICAL CARE Performed by: Minna Antis   Total critical care time: 45 minutes  Critical care time was exclusive of separately billable procedures and treating other patients.  Critical care was necessary to treat or prevent imminent or life-threatening deterioration.  Critical care was time spent personally by me on the following activities: development of treatment plan with patient and/or surrogate as well as nursing, discussions with consultants, evaluation of patient's response to treatment, examination of patient, obtaining history from patient or surrogate, ordering and performing treatments and interventions, ordering and review of laboratory studies, ordering and review of radiographic studies, pulse oximetry and re-evaluation of patient's condition.  ____________________________________________   FINAL CLINICAL IMPRESSION(S) / ED DIAGNOSES  Sepsis Pneumonia    Minna Antis, MD 03/11/17 1354

## 2017-03-11 NOTE — ED Notes (Signed)
CODE SEPSIS CALLED TO DOUG AT [facility] 

## 2017-03-11 NOTE — Progress Notes (Signed)
[facility]  ROUNDING NOTE   Subjective:   William Byrd admitted to [facility] ICU on 03/11/2017 for HCAP (healthcare-associated pneumonia) [J18.9] Sepsis, due to unspecified organism [facility]) [A41.9]  Patient intubated and sedated. History taken from wife and chart.   Patient known to our practice, follows with Dr. Candiss Norse for chronic kidney disease stage IV with proteinuria and hypertension.   Foley catheter placed. IV fluids   Objective:  Vital signs in last 24 hours:  Temp:  [97.1 F (36.2 C)-102.2 F (39 C)] 100.2 F (37.9 C) (01/30 1900) Pulse Rate:  [93-110] 97 (01/30 1900) Resp:  [15-24] 15 (01/30 1900) BP: (80-134)/(49-78) 93/56 (01/30 1900) SpO2:  [91 %-100 %] 97 % (01/30 2035) FiO2 (%):  [40 %] 40 % (01/30 2035) Weight:  [79.8 kg (176 lb)-84.1 kg (185 lb 6.5 oz)] 84.1 kg (185 lb 6.5 oz) (01/30 1600)  Weight change:  Filed Weights   03/11/17 1225 03/11/17 1600  Weight: 79.8 kg (176 lb) 84.1 kg (185 lb 6.5 oz)    Intake/Output: I/O last 3 completed shifts: In: 142.2 [I.V.:142.2] Out: 575 [Urine:575]   Intake/Output this shift:  Total I/O In: 157.6 [I.V.:107.6; IV Piggyback:50] Out: 42 [Urine:60]  Physical Exam: General: Critically ill  Head: ETT, OGT  Eyes: Anicteric, PERRL  Neck: Supple, trachea midline  Lungs:  PRBC FiO2 40%  Heart: Regular rate and rhythm  Abdomen:  +distended  Extremities:  no edema peripheral edema.  Neurologic: Intubated, sedated  Skin: No lesions  GU: Foley with yellow urine    Basic Metabolic Panel: Recent Labs  Lab 03/11/17 1201 03/11/17 1531  NA 148*  --   K 5.3*  --   CL 111  --   CO2 23  --   GLUCOSE 127*  --   BUN 92*  --   CREATININE 4.97* 4.60*  CALCIUM 9.1  --     Liver Function Tests: Recent Labs  Lab 03/11/17 1201  AST 36  ALT 27  ALKPHOS 80  BILITOT 0.7  PROT 8.5*  ALBUMIN 3.0*   No results for input(s): LIPASE, AMYLASE in the last 168 hours. No results for input(s): AMMONIA in  the last 168 hours.  CBC: Recent Labs  Lab 03/11/17 1201  WBC 8.9  NEUTROABS 7.1*  HGB 8.9*  HCT 26.9*  MCV 91.4  PLT 285    Cardiac Enzymes: Recent Labs  Lab 03/11/17 1201  TROPONINI 0.03*    BNP: Invalid input(s): POCBNP  CBG: Recent Labs  Lab 03/11/17 1505 03/11/17 1940  GLUCAP 151* 172*    Microbiology: Results for orders placed or performed during the hospital encounter of 03/11/17  MRSA PCR Screening     Status: None   Collection Time: 03/11/17  4:41 PM  Result Value Ref Range Status   MRSA by PCR NEGATIVE NEGATIVE Final    Comment:        The GeneXpert MRSA Assay (FDA approved for NASAL specimens only), is one component of a comprehensive MRSA colonization surveillance program. It is not intended to diagnose MRSA infection nor to guide or monitor treatment for MRSA infections. Performed at [facility], [address] [place], [place] [address]     Coagulation Studies: Recent Labs    03/11/17 1531  LABPROT 25.4*  INR 2.33    Urinalysis: Recent Labs    03/11/17 1236  COLORURINE YELLOW*  LABSPEC 1.013  PHURINE 5.0  [place] NEGATIVE  BILIRUBINUR NEGATIVE  KETONESUR NEGATIVE  PROTEINUR  30*  NITRITE NEGATIVE  LEUKOCYTESUR NEGATIVE      Imaging: Dg Abdomen 1 View  Result Date: 03/11/2017 CLINICAL DATA:  status post OG tube placement. EXAM: ABDOMEN - 1 VIEW COMPARISON:  11/15/2015 FINDINGS: The enteric tube tip is within the projection of the distal stomach. Side port below GE junction. No dilated loops of small bowel identified. IMPRESSION: 1. Tip of enteric tube in the projection of the distal stomach. Electronically Signed   By: Kerby Moors M.D.   On: 03/11/2017 12:59   Dg Chest Portable 1 View  Result Date: 03/11/2017 CLINICAL DATA:  Status post intubation.  OG tube placement EXAM: PORTABLE CHEST 1 VIEW COMPARISON:  11/14/2016 FINDINGS: ET tube tip is above the carina. An enteric tube is not  visualized. Normal heart size. No pleural effusion or edema. Bilateral lower lobe airspace opacities are new from previous exam. IMPRESSION: 1. ET tube tip is in satisfactory position above the carina. 2. Nonvisualization of enteric tube. 3. Bilateral lower lobe airspace opacities. Electronically Signed   By: Kerby Moors M.D.   On: 03/11/2017 12:56     Medications:   . sodium chloride 100 mL/hr at 03/11/17 1651  . [START ON 03/12/2017] ceFEPime (MAXIPIME) IV    . famotidine (PEPCID) IV Stopped (03/11/17 2014)  . propofol (DIPRIVAN) infusion 15 mcg/kg/min (03/11/17 1751)  . sodium chloride    . vancomycin 1,250 mg (03/11/17 2005)   . chlorhexidine gluconate (MEDLINE KIT)  15 mL Mouth Rinse BID  . heparin  5,000 Units Subcutaneous Q8H  . insulin aspart  0-15 Units Subcutaneous Q4H  . mouth rinse  15 mL Mouth Rinse 10 times per day   acetaminophen **OR** acetaminophen, albuterol, fentaNYL (SUBLIMAZE) injection, [DISCONTINUED] ondansetron **OR** ondansetron (ZOFRAN) IV  Assessment/ Plan:  William Byrd is a 84 y.o. black male with diabetes mellitus type II, hypertension, dementia, diabetic neuropathy, coronary artery disease, CVA  1. Acute Renal Failure on chronic kidney disease stage IV with proteinuria: baseline creatinine of 2.39, GFR of 27 11/14/2016.  Acute renal failure secondary to sepsis and prerenal azotemia - Agree with IV fluids - Wife states that patient is not interested in dialysis.   2. Sepsis: with pneumonia and acute respiratory failure requiring mechanical ventilation. Febrile.  - cefepime and vanco empirically.   3. Anemia with renal failure: hemoglobin 8.9  Overall prognosis is poor. Discussed with family and Dr. Alva Garnet.    LOS: 0 Suzzane Quilter 1/30/20199:04 PM

## 2017-03-11 NOTE — ED Notes (Signed)
Pt intubated 8.0 23 at the lip

## 2017-03-11 NOTE — Progress Notes (Signed)
Location:      Place of Service:    Provider:  Lorenso Quarry, NP-C  Marisue Ivan, MD  Patient Care Team: Marisue Ivan, MD as PCP - General (Family Medicine)  Extended Emergency Contact Information Primary Emergency Contact: [name] Address: [phone]          [place], [place] [address] [place] Home Phone: [phone] Relation: Spouse Secondary Emergency Contact: Christoper Fabian [place] Mobile Phone: [phone] Relation: Relative  Code Status:  FULL CODE Goals of care: Advanced Directive information Advanced Directives 03/11/2017  Does Patient Have a Medical Advance Directive? Yes  Type of Advance Directive Healthcare Power of Attorney  Does patient want to make changes to medical advance directive? -  Copy of Healthcare Power of Attorney in Chart? -  Would patient like information on creating a medical advance directive? -     No chief complaint on file.   HPI:  Pt is a 84 y.o. male seen today for an acute visit for cough. Staff reports pt has had increased dificullty swallowing the past few days. He was recently seen by SLP with recommendations for aspiration precautions. Today, staff reports he has had a low grade temp of 99.0, increased pulse rate of 88 (higher than his norm), respirations of 28. He is holding food in his mouth. His mentation is diminished from baseline, now mumbling more. He was able to speak a few words clearly a few weeks ago. Lungs coarse in upper lobes, diminished in bases. No cough heard or able to be ilicited. Heart RRR. Spoke with wife in hallway about the pt's overall decline over the past few months. This is his 3rd episode of PNA in the past 4 months. He just completed a coarse of Clindamycin for PNA on 1/23. Wife verbalized understanding and agreement with pt's over all decline. Wife also verbalized at that time she did not want him sent to the hospital, wanted him treated in the facility.  She stated she did not want him to be intubated. Wife agreeable to Palliative Care visit. Will continue to monitor closely.     Please note pt with limited verbal ability. Unable to obtain complete ROS. Some ROS info obtained from staff and documentation.    Past Medical History:  Diagnosis Date  . Arthritis   . Background retinopathy   . Cervicalgia   . Chronic kidney disease    follow up with a Dr.   . Chronic systolic CHF (congestive heart failure) (HCC)   . Congenital heart failure (HCC)   . Degeneration of lumbar or lumbosacral intervertebral disc   . Dementia   . Diabetes mellitus type 2, uncomplicated (HCC) 1996   on insulin since 2011, last eye exam with Dr. Inez Pilgrim 07/2013  . Diabetes mellitus without complication (HCC)   . DVT (deep venous thrombosis) (HCC)   . Essential hypertension    unspecified  . Generalized weakness   . Glaucoma   . Hypercholesteremia   . Hypertension   . Long-term insulin use (HCC)   . Moderate dementia   . Peripheral polyneuropathy   . Peripheral vascular disease (HCC)   . Pneumonia, unspecified organism 04/01/2014  . Pure hypercholesterolemia   . Spinal stenosis   . Spinal stenosis, lumbar region without neurogenic claudication    Past Surgical History:  Procedure Laterality Date  . ANGIOPLASTY / STENTING FEMORAL  1999  . ANTERIOR FUSION CERVICAL SPINE  07/2009  . c-spine fusion N/A   .  COLONOSCOPY  05/23/2004   Dr. Maryruth BunKapur, normal, PH colon polpys    Allergies  Allergen Reactions  . Fosinopril Other (See Comments)    Other reaction(s): Other (See Comments) Hyperkalemia Reaction: Hyperkalemia     Allergies as of 03/10/2017      Reactions   Fosinopril Other (See Comments)   Other reaction(s): Other (See Comments) Hyperkalemia Reaction: Hyperkalemia       Medication List        Accurate as of 03/10/17 11:59 PM. Always use your most recent med list.          acetaminophen 325 MG tablet Commonly known as:   TYLENOL Take 650 mg by mouth every 6 (six) hours as needed for mild pain, moderate pain, fever or headache.   albuterol 108 (90 Base) MCG/ACT inhaler Commonly known as:  PROVENTIL HFA;VENTOLIN HFA Inhale 2 puffs into the lungs every 4 (four) hours as needed for wheezing or shortness of breath.   amLODipine 10 MG tablet Commonly known as:  NORVASC Take 1 tablet (10 mg total) by mouth daily.   benzonatate 100 MG capsule Commonly known as:  TESSALON Take 200 mg by mouth every 8 (eight) hours as needed for cough.   bisacodyl 5 MG EC tablet Commonly known as:  DULCOLAX Take 1 tablet (5 mg total) by mouth daily as needed for moderate constipation.   cholecalciferol 1000 units tablet Commonly known as:  VITAMIN D Take 1,000 Units by mouth daily.   donepezil 10 MG tablet Commonly known as:  ARICEPT Take 10 mg by mouth at bedtime.   DULoxetine 20 MG capsule Commonly known as:  CYMBALTA Take 20 mg by mouth daily.   ENTRESTO 49-51 MG Generic drug:  sacubitril-valsartan Take 1 tablet by mouth 2 (two) times daily.   finasteride 5 MG tablet Commonly known as:  PROSCAR Take 5 mg by mouth daily. *staff : do not handle without gloves*   furosemide 20 MG tablet Commonly known as:  LASIX Take 2 tablets (40 mg) by mouth daily each morning. Take an additional 1 tablet (20 mg) by mouth  6 hours later.   GLUCAGON EMERGENCY 1 MG injection Generic drug:  glucagon Inject 1 mg into the vein once as needed.   GLUCERNA Liqd Take 1 Can by mouth daily.   HYDROcodone-acetaminophen 5-325 MG tablet Commonly known as:  NORCO/VICODIN Take 1 tablet by mouth every 6 (six) hours as needed for moderate pain.   insulin regular 100 units/mL injection Commonly known as:  NOVOLIN R,HUMULIN R Inject 3-12 Units into the skin 3 (three) times daily before meals. If BS < 60 call NP/PA, If BS is 175 to 250 give 3 units, 251 to 325 give 6 units, 326 to 450, give 10 units, if BS > 450 give 12 units and call  NP/PA, check bs ac&hs, if < 175 document 0 units   ipratropium-albuterol 0.5-2.5 (3) MG/3ML Soln Commonly known as:  DUONEB Take 3 mLs by nebulization 3 (three) times daily.   metoprolol tartrate 25 MG tablet Commonly known as:  LOPRESSOR Take 1 tablet (25 mg total) by mouth 2 (two) times daily.   NAMENDA XR 28 MG Cp24 24 hr capsule Generic drug:  memantine Take 28 mg by mouth daily.   OXYGEN Inhale 2 L into the lungs as needed. Check o2 sat prior to placing on patient and at least every 4 hours after applying   pantoprazole 20 MG tablet Commonly known as:  PROTONIX Take 20 mg by mouth daily.  pravastatin 10 MG tablet Commonly known as:  PRAVACHOL Take 10 mg by mouth daily.   pregabalin 75 MG capsule Commonly known as:  LYRICA Take 1 capsule (75 mg total) by mouth 2 (two) times daily.   RISA-BID PROBIOTIC Tabs Take 1 tablet by mouth 2 (two) times daily.   senna 8.6 MG Tabs tablet Commonly known as:  SENOKOT Take 1 tablet (8.6 mg total) by mouth daily.   STOOL SOFTENER 100 MG capsule Generic drug:  docusate sodium Take 100 mg by mouth daily as needed for mild constipation or moderate constipation.   tamsulosin 0.4 MG Caps capsule Commonly known as:  FLOMAX Take 0.4 mg by mouth at bedtime.   timolol 0.5 % ophthalmic solution Commonly known as:  TIMOPTIC Place 1 drop into both eyes 2 (two) times daily.   TOUJEO SOLOSTAR 300 UNIT/ML Sopn Generic drug:  Insulin Glargine Inject 60 Units into the skin every morning. 8 am   Triad Hydrophilic Wound Dressi Pste Apply cream topically to macerated areas of buttocks daily and prn   XARELTO 20 MG Tabs tablet Generic drug:  rivaroxaban Take 1 tablet by mouth daily.       Review of Systems  Unable to perform ROS: Dementia  Constitutional: Positive for activity change, appetite change, diaphoresis, fatigue and fever. Negative for chills.  HENT: Positive for congestion. Negative for mouth sores, nosebleeds, postnasal  drip, sneezing, sore throat, trouble swallowing and voice change.   Respiratory: Positive for cough and choking. Negative for apnea, chest tightness, shortness of breath and wheezing.   Cardiovascular: Positive for leg swelling (baseline). Negative for chest pain and palpitations.  Gastrointestinal: Negative for abdominal distention, abdominal pain, constipation, diarrhea and nausea.  Genitourinary: Negative for difficulty urinating, dysuria, frequency and urgency.  Musculoskeletal: Positive for arthralgias (typical arthritis). Negative for back pain, gait problem and myalgias.  Skin: Negative for color change, pallor, rash and wound.  Neurological: Negative for dizziness, tremors, syncope, speech difficulty, weakness, numbness and headaches.  Psychiatric/Behavioral: Negative for agitation and behavioral problems.  All other systems reviewed and are negative.   Immunization History  Administered Date(s) Administered  . Influenza Split 12/04/2014  . Influenza,inj,Quad PF,6+ Mos 11/11/2015  . Influenza-Unspecified 11/17/2011, 12/04/2014, 10/30/2016  . PPD Test 02/25/2014, 03/11/2014, 04/08/2016  . Pneumococcal Polysaccharide-23 02/11/2008   Pertinent  Health Maintenance Due  Topic Date Due  . FOOT EXAM  07/16/1941  . OPHTHALMOLOGY EXAM  07/16/1941  . URINE MICROALBUMIN  07/16/1941  . PNA vac Low Risk Adult (2 of 2 - PCV13) 02/10/2009  . HEMOGLOBIN A1C  08/31/2017  . INFLUENZA VACCINE  Completed   No flowsheet data found. Functional Status Survey:    There were no vitals filed for this visit. There is no height or weight on file to calculate BMI. Physical Exam  Constitutional: He appears well-developed and well-nourished. He appears lethargic. He is active and cooperative. He appears ill. No distress.  HENT:  Head: Normocephalic and atraumatic.  Mouth/Throat: Uvula is midline, oropharynx is clear and moist and mucous membranes are normal. Mucous membranes are not pale, not dry and  not cyanotic.  Eyes: Conjunctivae, EOM and lids are normal. Pupils are equal, round, and reactive to light.  Neck: Trachea normal, normal range of motion and full passive range of motion without pain. Neck supple. No JVD present. No tracheal deviation, no edema and no erythema present. No thyromegaly present.  Cardiovascular: Normal rate, regular rhythm, normal heart sounds, intact distal pulses and normal pulses. Exam reveals no  gallop, no distant heart sounds and no friction rub.  No murmur heard. Pulses:      Dorsalis pedis pulses are 2+ on the right side, and 2+ on the left side.  Mild BLE edema  Pulmonary/Chest: Effort normal. No accessory muscle usage. No respiratory distress. He has decreased breath sounds in the right lower field and the left lower field. He has no wheezes. He has rhonchi in the right upper field, the right middle field, the left upper field and the left middle field. He has no rales. He exhibits no tenderness.  Abdominal: Soft. Normal appearance and bowel sounds are normal. He exhibits no distension and no ascites. There is no tenderness.  Musculoskeletal: Normal range of motion. He exhibits no edema or tenderness.  Expected osteoarthritis, stiffness; Bilateral Calves soft, supple. Negative Homan's Sign. B- pedal pulses equal  Neurological: He has normal strength. He appears lethargic. He displays atrophy. A cranial nerve deficit and sensory deficit is present. He exhibits abnormal muscle tone. Coordination and gait abnormal.  Skin: Skin is warm, dry and intact. He is not diaphoretic. No cyanosis. No pallor. Nails show no clubbing.  Psychiatric: He has a normal mood and affect. His speech is normal and behavior is normal. Judgment and thought content normal. Cognition and memory are impaired. He exhibits abnormal recent memory and abnormal remote memory.  Nursing note and vitals reviewed.   Labs reviewed: Recent Labs    07/18/16 1100  10/30/16 0015 11/14/16 1236  03/03/17 0455  NA 138   < > 139 138 140  K 4.2   < > 4.9 5.0 4.4  CL 102   < > 107 101 105  CO2 28   < > 26 26 26   GLUCOSE 174*   < > 321* 206* 169*  BUN 35*   < > 45* 55* 81*  CREATININE 1.95*   < > 2.35* 2.39* 3.32*  CALCIUM 9.0   < > 8.8* 9.1 8.7*  MG 2.2  --   --   --  3.1*   < > = values in this interval not displayed.   Recent Labs    10/29/16 1440 11/14/16 1236 03/03/17 0455  AST 23 21 16   ALT 15* 17 13*  ALKPHOS 61 76 67  BILITOT 0.7 0.7 0.7  PROT 7.0 7.6 7.1  ALBUMIN 3.0* 3.2* 2.9*   Recent Labs    11/14/16 1236 03/03/17 0455 03/11/17 1201  WBC 9.6 4.1 8.9  NEUTROABS 7.8* 2.4 7.1*  HGB 9.4* 8.2* 8.9*  HCT 27.0* 25.1* 26.9*  MCV 94.9 92.4 91.4  PLT 225 237 285   Lab Results  Component Value Date   TSH 3.348 03/03/2017   Lab Results  Component Value Date   HGBA1C 8.5 (H) 03/03/2017   Lab Results  Component Value Date   CHOL 199 03/03/2017   HDL 26 (L) 03/03/2017   LDLCALC 137 (H) 03/03/2017   TRIG 179 (H) 03/03/2017   CHOLHDL 7.7 03/03/2017    Significant Diagnostic Results in last 30 days:  No results found.  Assessment/Plan Diagnoses and all orders for this visit:  Pneumonia of right lower lobe due to infectious organism (HCC)   Rocephin 1 Gram IM x 1 now, reconstitute with Lidocaine  Augmentin 875/125 mg po Q 12 hours x 7 day, start tonight  Duonebs Q 6 hours prn  Guaifenesin 10 mL po Q 4 hours prn  O2 2L [place] prn for sats <90%  Aspiration precautions  Hospice referral for EOL care  Family/ staff Communication:   Total Time:  Documentation:  Face to Face:  Family/Phone:   Labs/tests ordered:    Medication list reviewed and assessed for continued appropriateness.  Brynda Rim, NP-C [facility] [phone], [place] [address] Cell Phone (Mon-Fri 8am-5pm):  [phone] On Call:  [phone] & follow prompts after 5pm & weekends Office Phone:   [phone] Office Fax:  [phone]

## 2017-03-11 NOTE — Consult Note (Signed)
PULMONARY / CRITICAL CARE MEDICINE   Name: William Byrd MRN: [id number] DOB: 1931-03-27    ADMISSION DATE:  03/11/2017  PT PROFILE: 88 M with very advanced dementia, SNF resident, recently initiated on antibiotics for pneumonia, suspected to have aspirated at SNF.  Transferred to [facility] ED where he was found to be minimally responsive with agonal respirations.  Intubated after discussions regarding goals of care with patient's wife.  MAJOR EVENTS/TEST RESULTS: 01/30 admission via [facility] ED to ICU after intubation  INDWELLING DEVICES:: ETT 01/30 >>   MICRO DATA: MRSA PCR 01/30 >>  Urine  01/30 >>  Resp 01/30 >>  Blood 01/30 >>   ANTIMICROBIALS:  Vanc 01/30 >>  Cefepime 01/30 >>     HISTORY OF PRESENT ILLNESS:   Per wife, at his baseline he is largely bedbound and nonambulatory.  He does get pushed around in a wheelchair from time to time in the nursing home.  He often requires assistance with feeding.  He was recently started on antibiotics for pneumonia.  With this acute illness, he required further assistance with feeding and is presumed to have aspirated.  Recently, the patient's wife had a conversation with the SNF personnel regarding goals of care.  The patient was made DNR.  However, she informs me that she did not fully understand the issues being discussed.  In the emergency department, she reversed the DNR status and the patient was intubated.  PAST MEDICAL HISTORY :  He  has a past medical history of Arthritis, Background retinopathy, Cervicalgia, Chronic kidney disease, Chronic systolic CHF (congestive heart failure) (HCC), Congenital heart failure (HCC), Degeneration of lumbar or lumbosacral intervertebral disc, Dementia, Diabetes mellitus type 2, uncomplicated (HCC) (1996), Diabetes mellitus without complication (HCC), DVT (deep venous thrombosis) (HCC), Essential hypertension, Generalized weakness, Glaucoma, Hypercholesteremia, Hypertension, Long-term insulin use (HCC),  Moderate dementia, Peripheral polyneuropathy, Peripheral vascular disease (HCC), Pneumonia, unspecified organism (04/01/2014), Pure hypercholesterolemia, Spinal stenosis, and Spinal stenosis, lumbar region without neurogenic claudication.  PAST SURGICAL HISTORY: He  has a past surgical history that includes c-spine fusion (N/A); Angioplasty / stenting femoral (1999); Anterior fusion cervical spine (07/2009); and Colonoscopy (05/23/2004).  Allergies  Allergen Reactions  . Fosinopril Other (See Comments)    Other reaction(s): Other (See Comments) Hyperkalemia Reaction: Hyperkalemia     No current facility-administered medications on file prior to encounter.    Current Outpatient Medications on File Prior to Encounter  Medication Sig  . acetaminophen (TYLENOL) 325 MG tablet Take 650 mg by mouth every 6 (six) hours as needed for mild pain, moderate pain, fever or headache.  . albuterol (PROVENTIL HFA;VENTOLIN HFA) 108 (90 Base) MCG/ACT inhaler Inhale 2 puffs into the lungs every 4 (four) hours as needed for wheezing or shortness of breath.  Marland Kitchen amLODipine (NORVASC) 10 MG tablet Take 1 tablet (10 mg total) by mouth daily.  . benzonatate (TESSALON) 100 MG capsule Take 200 mg by mouth every 8 (eight) hours as needed for cough.  . bisacodyl (DULCOLAX) 5 MG EC tablet Take 1 tablet (5 mg total) by mouth daily as needed for moderate constipation.  . cefTRIAXone (ROCEPHIN) IVPB Inject 2 g into the vein once.  . cholecalciferol (VITAMIN D) 1000 UNITS tablet Take 1,000 Units by mouth daily.  Marland Kitchen docusate sodium (STOOL SOFTENER) 100 MG capsule Take 100 mg by mouth daily as needed for mild constipation or moderate constipation.   Marland Kitchen donepezil (ARICEPT) 10 MG tablet Take 10 mg by mouth at bedtime.   . DULoxetine (CYMBALTA) 20  MG capsule Take 20 mg by mouth daily.  . finasteride (PROSCAR) 5 MG tablet Take 5 mg by mouth daily. *staff : do not handle without gloves*  . furosemide (LASIX) 20 MG tablet Take 2  tablets (40 mg) by mouth daily each morning. Take an additional 1 tablet (20 mg) by mouth  6 hours later.  Marland Kitchen. glucagon (GLUCAGON EMERGENCY) 1 MG injection Inject 1 mg into the vein once as needed.  Marland Kitchen. GLUCERNA (GLUCERNA) LIQD Take 1 Can by mouth daily.   Marland Kitchen. HYDROcodone-acetaminophen (NORCO/VICODIN) 5-325 MG tablet Take 1 tablet by mouth every 6 (six) hours as needed for moderate pain.  . Insulin Glargine (TOUJEO SOLOSTAR) 300 UNIT/ML SOPN Inject 60 Units into the skin every morning. 8 am   . insulin regular (NOVOLIN R,HUMULIN R) 100 units/mL injection Inject 3-12 Units into the skin 4 (four) times daily -  before meals and at bedtime. If BS < 60 call NP/PA, If BS is 175 to 250 give 3 units, 251 to 325 give 6 units, 326 to 450, give 10 units, if BS > 450 give 12 units and call NP/PA, check bs ac&hs, if < 175 document 0 units   . ipratropium-albuterol (DUONEB) 0.5-2.5 (3) MG/3ML SOLN Take 3 mLs by nebulization as needed.   . memantine (NAMENDA XR) 28 MG CP24 24 hr capsule Take 28 mg by mouth daily.   . metoprolol tartrate (LOPRESSOR) 25 MG tablet Take 1 tablet (25 mg total) by mouth 2 (two) times daily.  . OXYGEN Inhale 2 L into the lungs as needed. Check o2 sat prior to placing on patient and at least every 4 hours after applying  . pantoprazole (PROTONIX) 20 MG tablet Take 20 mg by mouth daily.  . pravastatin (PRAVACHOL) 10 MG tablet Take 10 mg by mouth daily.  . pregabalin (LYRICA) 75 MG capsule Take 1 capsule (75 mg total) by mouth 2 (two) times daily.  . Probiotic Product (RISA-BID PROBIOTIC) TABS Take 1 tablet by mouth 2 (two) times daily.  . rivaroxaban (XARELTO) 20 MG TABS tablet Take 1 tablet by mouth daily.   . sacubitril-valsartan (ENTRESTO) 49-51 MG Take 1 tablet by mouth 2 (two) times daily.  Marland Kitchen. senna (SENOKOT) 8.6 MG TABS tablet Take 1 tablet (8.6 mg total) by mouth daily.  . tamsulosin (FLOMAX) 0.4 MG CAPS capsule Take 0.4 mg by mouth at bedtime.  . timolol (TIMOPTIC) 0.5 % ophthalmic  solution Place 1 drop into both eyes 2 (two) times daily.   . Wound Dressings (TRIAD HYDROPHILIC WOUND DRESSI) PSTE Apply cream topically to macerated areas of buttocks daily and prn    FAMILY HISTORY:  His indicated that his mother is deceased. He indicated that his father is deceased. He indicated that his sister is alive. He indicated that his brother is deceased. He indicated that his daughter is alive. He indicated that all of his three sons are alive. He indicated that his maternal aunt is deceased. He indicated that his maternal uncle is deceased. He indicated that his paternal aunt is deceased. He indicated that his paternal uncle is deceased.   SOCIAL HISTORY: He  reports that he quit smoking about 18 years ago. His smoking use included cigarettes. He has a 23.00 pack-year smoking history. he has never used smokeless tobacco. He reports that he does not drink alcohol or use drugs.  REVIEW OF SYSTEMS:   Level 5 caveat  SUBJECTIVE:    VITAL SIGNS: BP (!) 98/59   Pulse 93   Temp  99.9 F (37.7 C)   Resp 17   Ht 5\' 11"  (1.803 m)   Wt 84.1 kg (185 lb 6.5 oz)   SpO2 97%   BMI 25.86 kg/m   HEMODYNAMICS:    VENTILATOR SETTINGS: Vent Mode: PRVC FiO2 (%):  [40 %] 40 % Set Rate:  [16 bmp] 16 bmp Vt Set:  [500 mL] 500 mL PEEP:  [5 cmH20] 5 cmH20  INTAKE / OUTPUT: No intake/output data recorded.  PHYSICAL EXAMINATION: General: Appears younger than true age, intubated, sedated, minimally responsive Neuro: Pupils pinpoint, CNs intact, moves all extremities HEENT: NCAT, sclerae white Cardiovascular: Regular, no M Lungs: Clear to auscultation anteriorly Abdomen: Soft, nondistended, diminished BS Extremities: Warm, no edema  LABS:  BMET Recent Labs  Lab 03/11/17 1201 03/11/17 1531  NA 148*  --   K 5.3*  --   CL 111  --   CO2 23  --   BUN 92*  --   CREATININE 4.97* 4.60*  GLUCOSE 127*  --     Electrolytes Recent Labs  Lab 03/11/17 1201  CALCIUM 9.1     CBC Recent Labs  Lab 03/11/17 1201  WBC 8.9  HGB 8.9*  HCT 26.9*  PLT 285    Coag's Recent Labs  Lab 03/11/17 1531  APTT 39*  INR 2.33    Sepsis Markers Recent Labs  Lab 03/11/17 1201 03/11/17 1531  LATICACIDVEN 1.9 2.2*  PROCALCITON  --  41.70    ABG Recent Labs  Lab 03/11/17 1620  PHART 7.37  PCO2ART 41  PO2ART PENDING    Liver Enzymes Recent Labs  Lab 03/11/17 1201  AST 36  ALT 27  ALKPHOS 80  BILITOT 0.7  ALBUMIN 3.0*    Cardiac Enzymes Recent Labs  Lab 03/11/17 1201  TROPONINI 0.03*    Glucose Recent Labs  Lab 03/11/17 1505  GLUCAP 151*    Imaging Dg Abdomen 1 View  Result Date: 03/11/2017 CLINICAL DATA:  status post OG tube placement. EXAM: ABDOMEN - 1 VIEW COMPARISON:  11/15/2015 FINDINGS: The enteric tube tip is within the projection of the distal stomach. Side port below GE junction. No dilated loops of small bowel identified. IMPRESSION: 1. Tip of enteric tube in the projection of the distal stomach. Electronically Signed   By: Signa Kell M.D.   On: 03/11/2017 12:59   Dg Chest Portable 1 View  Result Date: 03/11/2017 CLINICAL DATA:  Status post intubation.  OG tube placement EXAM: PORTABLE CHEST 1 VIEW COMPARISON:  11/14/2016 FINDINGS: ET tube tip is above the carina. An enteric tube is not visualized. Normal heart size. No pleural effusion or edema. Bilateral lower lobe airspace opacities are new from previous exam. IMPRESSION: 1. ET tube tip is in satisfactory position above the carina. 2. Nonvisualization of enteric tube. 3. Bilateral lower lobe airspace opacities. Electronically Signed   By: Signa Kell M.D.   On: 03/11/2017 12:56    ASSESSMENT / PLAN:  PULMONARY A: Acute hypoxemic respiratory failure Presumed aspiration P:   Vent settings established Vent bundle implemented Daily SBT as indicated   CARDIOVASCULAR A:  No acute issues P:  Monitor rhythm and BP MAP goal >65 mmHg  RENAL A:    CKD AKI Mild hyperkalemia P:   Monitor BMET intermittently Monitor I/Os Correct electrolytes as indicated Nephrology consultation requested by primary service  GASTROINTESTINAL A:   No issues P:   SUP: IV famotidine Consider TF protocol 1/31 if not extubated  HEMATOLOGIC A:   Chronic anemia  without acute blood loss P:  DVT px: SQ heparin Monitor CBC intermittently Transfuse per usual guidelines   INFECTIOUS A:   Aspiration pneumonia P:   Monitor temp, WBC count Micro and abx as above   ENDOCRINE A:   Type 2 diabetes P:   SSI protocol, moderate scale  NEUROLOGIC A:   Acute encephalopathy Very advanced dementia P:   RASS goal: -1, -2 PAD protocol, propofol and intermittent fentanyl  GOALS OF CARE: I had a detailed discussion with patient's wife discussing his chronic poor state of health and current critical illness.  We agreed that we would not escalate his level of care much from his current status.  Therefore, no ACLS, no HD.  We will continue ventilatory support no longer than 3-4 days and consider one-way extubation or terminal extubation at that time.   CCM time: 45 mins  The above time includes time spent in consultation with patient and/or family members and reviewing care plan on multidisciplinary rounds  Billy Fischer, MD PCCM service Mobile [phone] Pager [phone]    03/11/2017, 5:34 PM

## 2017-03-11 NOTE — H&P (Signed)
[employer] [facility] [place] [facility]   PATIENT NAME: William Byrd    MR#:  [id number]  DATE OF BIRTH:  07-Oct-1931  DATE OF ADMISSION:  03/11/2017  PRIMARY CARE PHYSICIAN: Marisue Ivan, MD   REQUESTING/REFERRING PHYSICIAN: Minna Antis, MD  CHIEF COMPLAINT:   Chief Complaint  Patient presents with  . Pneumonia   Pneumonia. HISTORY OF PRESENT ILLNESS:  William Byrd  is a 84 y.o. male with multiple medical planes as below.  The patient was sent from nursing home to the ED due to unresponsiveness.  Per ED physician, the patient has been somnolent and non-responsive for the past 3 days.  He has fever 101 in the ED.  He was treated with nonrebreather mask but O2 saturation was still low.  Chest x-ray showed pneumonia.  The patient was found septic and hypotension, treated with normal saline bolus and antibiotics in the ED.  He was intubated in the ED.  The patient has history of multiple times of pneumonia recently, treated with antibiotics.   PAST MEDICAL HISTORY:   Past Medical History:  Diagnosis Date  . Arthritis   . Background retinopathy   . Cervicalgia   . Chronic kidney disease    follow up with a Dr.   . Chronic systolic CHF (congestive heart failure) (HCC)   . Congenital heart failure (HCC)   . Degeneration of lumbar or lumbosacral intervertebral disc   . Dementia   . Diabetes mellitus type 2, uncomplicated (HCC) 1996   on insulin since 2011, last eye exam with Dr. Inez Pilgrim 07/2013  . Diabetes mellitus without complication (HCC)   . DVT (deep venous thrombosis) (HCC)   . Essential hypertension    unspecified  . Generalized weakness   . Glaucoma   . Hypercholesteremia   . Hypertension   . Long-term insulin use (HCC)   . Moderate dementia   . Peripheral polyneuropathy   . Peripheral vascular disease (HCC)   . Pneumonia, unspecified organism 04/01/2014  . Pure hypercholesterolemia   . Spinal stenosis   . Spinal stenosis, lumbar  region without neurogenic claudication     PAST SURGICAL HISTORY:   Past Surgical History:  Procedure Laterality Date  . ANGIOPLASTY / STENTING FEMORAL  1999  . ANTERIOR FUSION CERVICAL SPINE  07/2009  . c-spine fusion N/A   . COLONOSCOPY  05/23/2004   Dr. Maryruth Bun, normal, [facility] colon polpys    SOCIAL HISTORY:   Social History   Tobacco Use  . Smoking status: Former Smoker    Packs/day: 0.50    Years: 46.00    Pack years: 23.00    Types: Cigarettes    Last attempt to quit: 06/11/1998    Years since quitting: 18.7  . Smokeless tobacco: Never Used  Substance Use Topics  . Alcohol use: No    FAMILY HISTORY:   Family History  Problem Relation Age of Onset  . Diabetes Mother   . Liver cancer Father   . Liver disease Father   . Arthritis Sister     DRUG ALLERGIES:   Allergies  Allergen Reactions  . Fosinopril Other (See Comments)    Other reaction(s): Other (See Comments) Hyperkalemia Reaction: Hyperkalemia     REVIEW OF SYSTEMS:   Review of Systems  Unable to perform ROS: Intubated    MEDICATIONS AT HOME:   Prior to Admission medications   Medication Sig Start Date End Date Taking? Authorizing Provider  acetaminophen (TYLENOL) 325 MG tablet Take 650 mg by mouth  every 6 (six) hours as needed for mild pain, moderate pain, fever or headache.   Yes [provider]  albuterol (PROVENTIL HFA;VENTOLIN HFA) 108 (90 Base) MCG/ACT inhaler Inhale 2 puffs into the lungs every 4 (four) hours as needed for wheezing or shortness of breath.   Yes [provider]  amLODipine (NORVASC) 10 MG tablet Take 1 tablet (10 mg total) by mouth daily. 11/15/15  Yes Altamese Dilling, MD  benzonatate (TESSALON) 100 MG capsule Take 200 mg by mouth every 8 (eight) hours as needed for cough.   Yes [provider]  bisacodyl (DULCOLAX) 5 MG EC tablet Take 1 tablet (5 mg total) by mouth daily as needed for moderate constipation. 11/14/15  Yes Altamese Dilling,  MD  cefTRIAXone (ROCEPHIN) IVPB Inject 2 g into the vein once. 03/10/17  Yes [provider]  cholecalciferol (VITAMIN D) 1000 UNITS tablet Take 1,000 Units by mouth daily.   Yes [provider]  docusate sodium (STOOL SOFTENER) 100 MG capsule Take 100 mg by mouth daily as needed for mild constipation or moderate constipation.    Yes [provider]  donepezil (ARICEPT) 10 MG tablet Take 10 mg by mouth at bedtime.  07/05/14  Yes [provider]  DULoxetine (CYMBALTA) 20 MG capsule Take 20 mg by mouth daily.   Yes [provider]  finasteride (PROSCAR) 5 MG tablet Take 5 mg by mouth daily. *staff : do not handle without gloves* 07/19/14  Yes [provider]  furosemide (LASIX) 20 MG tablet Take 2 tablets (40 mg) by mouth daily each morning. Take an additional 1 tablet (20 mg) by mouth  6 hours later.   Yes [provider]  glucagon (GLUCAGON EMERGENCY) 1 MG injection Inject 1 mg into the vein once as needed.   Yes [provider]  GLUCERNA Frederica Kuster) LIQD Take 1 Can by mouth daily.    Yes [provider]  HYDROcodone-acetaminophen (NORCO/VICODIN) 5-325 MG tablet Take 1 tablet by mouth every 6 (six) hours as needed for moderate pain. 11/14/15  Yes Altamese Dilling, MD  Insulin Glargine (TOUJEO SOLOSTAR) 300 UNIT/ML SOPN Inject 60 Units into the skin every morning. 8 am    Yes [provider]  insulin regular (NOVOLIN R,HUMULIN R) 100 units/mL injection Inject 3-12 Units into the skin 4 (four) times daily -  before meals and at bedtime. If BS < 60 call NP/PA, If BS is 175 to 250 give 3 units, 251 to 325 give 6 units, 326 to 450, give 10 units, if BS > 450 give 12 units and call NP/PA, check bs ac&hs, if < 175 document 0 units    Yes [provider]  ipratropium-albuterol (DUONEB) 0.5-2.5 (3) MG/3ML SOLN Take 3 mLs by nebulization as needed.    Yes [provider]  memantine (NAMENDA XR) 28 MG CP24  24 hr capsule Take 28 mg by mouth daily.    Yes [provider]  metoprolol tartrate (LOPRESSOR) 25 MG tablet Take 1 tablet (25 mg total) by mouth 2 (two) times daily. 09/08/14  Yes Wieting, Richard, MD  OXYGEN Inhale 2 L into the lungs as needed. Check o2 sat prior to placing on patient and at least every 4 hours after applying   Yes [provider]  pantoprazole (PROTONIX) 20 MG tablet Take 20 mg by mouth daily.   Yes [provider]  pravastatin (PRAVACHOL) 10 MG tablet Take 10 mg by mouth daily.   Yes [provider]  pregabalin (  LYRICA) 75 MG capsule Take 1 capsule (75 mg total) by mouth 2 (two) times daily. 10/20/16  Yes Lorenso Quarry, NP  Probiotic Product (RISA-BID PROBIOTIC) TABS Take 1 tablet by mouth 2 (two) times daily.   Yes [provider]  rivaroxaban (XARELTO) 20 MG TABS tablet Take 1 tablet by mouth daily.  07/18/14  Yes [provider]  sacubitril-valsartan (ENTRESTO) 49-51 MG Take 1 tablet by mouth 2 (two) times daily.   Yes [provider]  senna (SENOKOT) 8.6 MG TABS tablet Take 1 tablet (8.6 mg total) by mouth daily. 11/14/15  Yes Altamese Dilling, MD  tamsulosin (FLOMAX) 0.4 MG CAPS capsule Take 0.4 mg by mouth at bedtime.   Yes [provider]  timolol (TIMOPTIC) 0.5 % ophthalmic solution Place 1 drop into both eyes 2 (two) times daily.    Yes [provider]  Wound Dressings (TRIAD HYDROPHILIC WOUND DRESSI) PSTE Apply cream topically to macerated areas of buttocks daily and prn   Yes [provider]      VITAL SIGNS:  Blood pressure 114/78, pulse (!) 107, temperature (!) 101.4 F (38.6 C), resp. rate (!) 24, weight 176 lb (79.8 kg), SpO2 91 %.  PHYSICAL EXAMINATION:  Physical Exam  GENERAL:  84 y.o.-year-old patient lying in the bed, on ventilation. EYES: Pupils equal, round, pinpoint, not reactive to light. No scleral icterus. Extraocular muscles intact.  HEENT: Head  atraumatic, normocephalic.  NECK:  Supple, no jugular venous distention.  LUNGS: Bilateral lung crackles. CARDIOVASCULAR: S1, S2 normal. No murmurs, rubs, or gallops.  ABDOMEN: Soft, nontender, nondistended. Bowel sounds present. No organomegaly or mass.  EXTREMITIES: No pedal edema, cyanosis, or clubbing.  NEUROLOGIC: Unable to exam. PSYCHIATRIC: The patient is intubated and on sedation. SKIN: No obvious rash, lesion, or ulcer.   LABORATORY PANEL:   CBC Recent Labs  Lab 03/11/17 1201  WBC 8.9  HGB 8.9*  HCT 26.9*  PLT 285   ------------------------------------------------------------------------------------------------------------------  Chemistries  Recent Labs  Lab 03/11/17 1201  NA 148*  K 5.3*  CL 111  CO2 23  GLUCOSE 127*  BUN 92*  CREATININE 4.97*  CALCIUM 9.1  AST 36  ALT 27  ALKPHOS 80  BILITOT 0.7   ------------------------------------------------------------------------------------------------------------------  Cardiac Enzymes Recent Labs  Lab 03/11/17 1201  TROPONINI 0.03*   ------------------------------------------------------------------------------------------------------------------  RADIOLOGY:  Dg Abdomen 1 View  Result Date: 03/11/2017 CLINICAL DATA:  status post OG tube placement. EXAM: ABDOMEN - 1 VIEW COMPARISON:  11/15/2015 FINDINGS: The enteric tube tip is within the projection of the distal stomach. Side port below GE junction. No dilated loops of small bowel identified. IMPRESSION: 1. Tip of enteric tube in the projection of the distal stomach. Electronically Signed   By: Signa Kell M.D.   On: 03/11/2017 12:59   Dg Chest Portable 1 View  Result Date: 03/11/2017 CLINICAL DATA:  Status post intubation.  OG tube placement EXAM: PORTABLE CHEST 1 VIEW COMPARISON:  11/14/2016 FINDINGS: ET tube tip is above the carina. An enteric tube is not visualized. Normal heart size. No pleural effusion or edema. Bilateral lower lobe airspace  opacities are new from previous exam. IMPRESSION: 1. ET tube tip is in satisfactory position above the carina. 2. Nonvisualization of enteric tube. 3. Bilateral lower lobe airspace opacities. Electronically Signed   By: Signa Kell M.D.   On: 03/11/2017 12:56      IMPRESSION AND PLAN:   Acute respiratory failure due to sepsis and pneumonia The patient will be admitted to  ICU. He is intubated in the ED, continue ventilation and follow-up with intensivist Dr. Sung AmabileSimonds.  Septic shock due to pneumonia. The patient was treated with cefepime and vancomycin, given normal saline bolus in the ED.  We will continue cefepime and vancomycin pharmacy to dose, follow-up CBC and cultures.  Continue normal saline IV and Levophed drip as needed.  Acute renal failure on CKD stage IV. IV fluids support and follow-up BMP.  Nephrology consult.  Hypernatremia and hyperkalemia.  Continue IV fluid support and follow-up BMP.  I discussed with Dr. Sung AmabileSimonds.  All the records are reviewed and case discussed with ED provider. Management plans discussed with the patient, his wife and they are in agreement.  CODE STATUS: Full code  TOTAL CRITICAL TIME TAKING CARE OF THIS PATIENT: 58 minutes.    Shaune PollackQing Jenasis Straley M.D on 03/11/2017 at 2:35 PM  Between 7am to 6pm - Pager - [phone]  After 6pm go to [email or web address] - Social research officer, governmentpassword [email or web address]  [facility] [place] [facility]  Office  [phone]  CC: Primary care physician; [name], [name], MD   Note: This dictation was prepared with Dragon dictation along with smaller phrase technology. Any transcriptional errors that result from this process are unin

## 2017-03-12 ENCOUNTER — Inpatient Hospital Stay: Payer: Medicare HMO

## 2017-03-12 DIAGNOSIS — G9340 Encephalopathy, unspecified: Secondary | ICD-10-CM

## 2017-03-12 DIAGNOSIS — N189 Chronic kidney disease, unspecified: Secondary | ICD-10-CM

## 2017-03-12 LAB — BASIC METABOLIC PANEL
Anion gap: 9 (ref 5–15)
BUN: 82 mg/dL — ABNORMAL HIGH (ref 6–20)
CALCIUM: 8.2 mg/dL — AB (ref 8.9–10.3)
CO2: 23 mmol/L (ref 22–32)
CREATININE: 3.92 mg/dL — AB (ref 0.61–1.24)
Chloride: 120 mmol/L — ABNORMAL HIGH (ref 101–111)
GFR calc non Af Amer: 13 mL/min — ABNORMAL LOW (ref >60)
GFR, EST AFRICAN AMERICAN: 15 mL/min — AB (ref >60)
Glucose, Bld: 97 mg/dL (ref 65–99)
Potassium: 4.4 mmol/L (ref 3.5–5.1)
SODIUM: 152 mmol/L — AB (ref 135–145)

## 2017-03-12 LAB — GLUCOSE, CAPILLARY
GLUCOSE-CAPILLARY: 76 mg/dL (ref 65–99)
GLUCOSE-CAPILLARY: 103 mg/dL — AB (ref 65–99)
GLUCOSE-CAPILLARY: 202 mg/dL — AB (ref 65–99)
Glucose-Capillary: 132 mg/dL — ABNORMAL HIGH (ref 65–99)
Glucose-Capillary: 183 mg/dL — ABNORMAL HIGH (ref 65–99)
Glucose-Capillary: 189 mg/dL — ABNORMAL HIGH (ref 65–99)

## 2017-03-12 LAB — CBC
HCT: 23.5 % — ABNORMAL LOW (ref 40.0–52.0)
Hemoglobin: 7.5 g/dL — ABNORMAL LOW (ref 13.0–18.0)
MCH: 29.1 pg (ref 26.0–34.0)
MCHC: 31.7 g/dL — ABNORMAL LOW (ref 32.0–36.0)
MCV: 91.8 fL (ref 80.0–100.0)
PLATELETS: 194 10*3/uL (ref 150–440)
RBC: 2.56 MIL/uL — ABNORMAL LOW (ref 4.40–5.90)
RDW: 17.6 % — ABNORMAL HIGH (ref 11.5–14.5)
WBC: 12 10*3/uL — AB (ref 3.8–10.6)

## 2017-03-12 MED ORDER — DEXTROSE 5 % IV SOLN
INTRAVENOUS | Status: DC
  Administered 2017-03-12: 10:00:00 via INTRAVENOUS

## 2017-03-12 MED ORDER — BISACODYL 10 MG RE SUPP: 10.0000 mg | Freq: Every day | RECTAL | Status: DC | PRN

## 2017-03-12 MED ORDER — BISACODYL 10 MG RE SUPP
10.0000 mg | Freq: Once | RECTAL | Status: AC
  Administered 2017-03-12: 10 mg via RECTAL
  Filled 2017-03-12: qty 1

## 2017-03-12 MED ORDER — SODIUM CHLORIDE 0.9 % IV BOLUS (SEPSIS)
500.0000 mL | Freq: Once | INTRAVENOUS | Status: AC
  Administered 2017-03-12: 500 mL via INTRAVENOUS

## 2017-03-12 MED ORDER — DEXTROSE 5 % IV SOLN
0.0000 ug/min | INTRAVENOUS | Status: DC
Start: 2017-03-12 — End: 2017-03-16

## 2017-03-12 MED ORDER — DEXTROSE-NACL 5-0.45 % IV SOLN
INTRAVENOUS | Status: DC
  Administered 2017-03-12 – 2017-03-13 (×2): via INTRAVENOUS

## 2017-03-12 NOTE — Progress Notes (Signed)
[employer] [facility] [place] at [facility]   PATIENT NAME: William Byrd    MR#:  [id number]  DATE OF BIRTH:  07/04/1931  SUBJECTIVE:  CHIEF COMPLAINT:   Chief Complaint  Patient presents with  . Pneumonia    REVIEW OF SYSTEMS:  Review of Systems  Unable to perform ROS: Critical illness    DRUG ALLERGIES:   Allergies  Allergen Reactions  . Fosinopril Other (See Comments)    Other reaction(s): Other (See Comments) Hyperkalemia Reaction: Hyperkalemia     VITALS:  Blood pressure 101/64, pulse 84, temperature 99.5 F (37.5 C), resp. rate 18, height 5\' 11"  (1.803 m), weight 84.1 kg (185 lb 6.5 oz), SpO2 99 %.  PHYSICAL EXAMINATION:  Physical Exam  GENERAL:  84 y.o.-year-old critically ill appearing patient lying in the bed, Intubated and sedated EYES: Pupils equal, round, reactive to light and accommodation. No scleral icterus.Marland Kitchen.  HEENT: Head atraumatic, normocephalic. Oropharynx and nasopharynx clear.  NECK:  Supple, no jugular venous distention. No thyroid enlargement, no tenderness. OT tube present LUNGS: scant breath sounds bilaterally, no wheezing, rales  or crepitation. No use of accessory muscles of respiration. Decreased bibasilar breath sounds CARDIOVASCULAR: S1, S2 normal. No rubs, or gallops. 3/6 systolic murmur present ABDOMEN: Soft, nontender, nondistended. Bowel sounds present. No organomegaly or mass.  EXTREMITIES: No pedal edema, cyanosis, or clubbing.  NEUROLOGIC: sedated, not following .  PSYCHIATRIC: The patient is intubated and sedated SKIN: No obvious rash, lesion, or ulcer.    LABORATORY PANEL:   CBC Recent Labs  Lab 03/12/17 0756  WBC 12.0*  HGB 7.5*  HCT 23.5*  PLT 194   ------------------------------------------------------------------------------------------------------------------  Chemistries  Recent Labs  Lab 03/11/17 1201  03/12/17 0756  NA 148*  --  152*  K 5.3*  --  4.4  CL 111  --  120*  CO2 23  --  23    GLUCOSE 127*  --  97  BUN 92*  --  82*  CREATININE 4.97*   < > 3.92*  CALCIUM 9.1  --  8.2*  AST 36  --   --   ALT 27  --   --   ALKPHOS 80  --   --   BILITOT 0.7  --   --    < > = values in this interval not displayed.   ------------------------------------------------------------------------------------------------------------------  Cardiac Enzymes Recent Labs  Lab 03/11/17 1201  TROPONINI 0.03*   ------------------------------------------------------------------------------------------------------------------  RADIOLOGY:  Dg Abd 1 View  Result Date: 03/12/2017 CLINICAL DATA:  Abdominal distension EXAM: ABDOMEN - 1 VIEW COMPARISON:  03/11/2017 FINDINGS: Nasogastric tube with the tip projecting over the stomach. There is a moderate amount of stool throughout the colon. There is no bowel dilatation to suggest obstruction. There is no evidence of pneumoperitoneum, portal venous gas or pneumatosis. There are calcifications scattered throughout the spleen likely reflecting sequela prior granulomatous disease. There are no pathologic calcifications along the expected course of the ureters. There is right lower lobe airspace disease and to a lesser extent left lower lobe airspace disease concerning for pneumonia versus aspiration pneumonia. The osseous structures are unremarkable. IMPRESSION: 1. Nasogastric tube with the tip projecting over the stomach. No evidence of obstruction. 2. Moderate amount of stool throughout the colon. 3. Bilateral lower lobe airspace disease, right greater than left concerning for pneumonia versus aspiration pneumonia. Electronically Signed   By: Elige KoHetal  Patel   On: 03/12/2017 10:32   Dg Abdomen 1 View  Result Date: 03/11/2017 CLINICAL DATA:  status post OG tube placement. EXAM: ABDOMEN - 1 VIEW COMPARISON:  11/15/2015 FINDINGS: The enteric tube tip is within the projection of the distal stomach. Side port below GE junction. No dilated loops of small bowel  identified. IMPRESSION: 1. Tip of enteric tube in the projection of the distal stomach. Electronically Signed   By: Signa Kell M.D.   On: 03/11/2017 12:59   US Renal  Result Date: 03/12/2017 CLINICAL DATA:  Acute kidney failure EXAM: RENAL / URINARY TRACT ULTRASOUND COMPLETE COMPARISON:  None. FINDINGS: Right Kidney: Length: 9.9 cm. Echogenicity within normal limits. Small echogenic foci in the right kidney likely reflecting small nephrolithiasis with the largest measuring 9 mm. No mass or hydronephrosis visualized. Left Kidney: Length: 10.7 cm. Echogenicity within normal limits. Small echogenic focus in the left kidney likely reflecting small nephrolithiasis measuring 7 mm. No mass or hydronephrosis visualized. Bladder: Appears normal for degree of bladder distention. IMPRESSION: 1. No obstructive uropathy. 2. Bilateral nonobstructing nephrolithiasis. Electronically Signed   By: Elige Ko   On: 03/12/2017 14:52   Dg Chest Port 1 View  Result Date: 03/12/2017 CLINICAL DATA:  Respiratory distress EXAM: PORTABLE CHEST 1 VIEW COMPARISON:  Yesterday FINDINGS: Endotracheal tube tip just below the clavicular heads. An orogastric tube reaches the stomach. Indistinct bilateral lung opacities greatest at the bases, progressed in density and extent. Normal heart size for technique. No edema, effusion, or pneumothorax. Artifact from EKG leads. Granulomatous calcifications in the spleen. IMPRESSION: 1. Progressive bilateral pneumonia. 2. Unremarkable positioning of endotracheal and orogastric tubes. Electronically Signed   By: Marnee Spring M.D.   On: 03/12/2017 08:17   Dg Chest Portable 1 View  Result Date: 03/11/2017 CLINICAL DATA:  Status post intubation.  OG tube placement EXAM: PORTABLE CHEST 1 VIEW COMPARISON:  11/14/2016 FINDINGS: ET tube tip is above the carina. An enteric tube is not visualized. Normal heart size. No pleural effusion or edema. Bilateral lower lobe airspace opacities are new from  previous exam. IMPRESSION: 1. ET tube tip is in satisfactory position above the carina. 2. Nonvisualization of enteric tube. 3. Bilateral lower lobe airspace opacities. Electronically Signed   By: Signa Kell M.D.   On: 03/11/2017 12:56    EKG:   Orders placed or performed during the hospital encounter of 03/11/17  . ED EKG 12-Lead  . ED EKG 12-Lead  . EKG 12-Lead  . EKG 12-Lead    ASSESSMENT AND PLAN:    84 year old male with multiple medical problems including advanced dementia who is bedbound at the nursing home at baseline, CKD, congestive heart failure, diabetes mellitus, hypertension, spinal stenosis and neuropathy presents to hospital secondary to unresponsive episode.  1.  Acute hypoxic respiratory failure-abdominal respirations on arrival.  Intubated for hypoxia and airway protection. -Continues on the ventilator.  Appreciate intensivist consult -On 40% FiO2 at this time.  Chest x-ray with bibasilar pneumonia.  Concern for aspiration. -Blood cultures are pending.  Continue vancomycin and cefepime at this time.  2.  Acute renal failure on CKD stage IV-baseline creatinine of 2.3, now much worse secondary to sepsis and prerenal azotemia -Family agree with no escalation of care and definitely no hemodialysis. -Continue fluids for now -Monitor urine output.  Renal ultrasound, avoid nephrotoxins  3.  Sepsis-secondary to healthcare acquired pneumonia.  On ventilator, antibiotics as described above -Elevated pro-calcitonin -On low-dose pressors  Hyponatremia-free water deficit.  On D5 1/2 normal saline  5.  Acute on chronic anemia-has anemia of chronic kidney disease-no active bleeding.  Hemoglobin  dropped from baseline of 97.5.  Partly dilutional and also acute illness. -Monitor closely, transfuse if hemoglobin is less than 7.  6.  DVT prophylaxis-on subcu heparin.  Hold if hemoglobin drop is significant   Overall poor prognosis.  Intensivist had a family meeting with  patient's family and they have agreed for DNR for now, no further escalation of care at this time.  Continue vent support for the next 3 days and further decisions based on patient change in clinical status.    All the records are reviewed and case discussed with Care Management/Social Workerr. Management plans discussed with the patient, family and they are in agreement.  CODE STATUS: DNR  TOTAL TIME TAKING CARE OF THIS PATIENT: 33 minutes.   POSSIBLE D/C IN 2-3 DAYS, DEPENDING ON CLINICAL CONDITION.   Yaden Seith M.D on 03/12/2017 at 3:00 PM  Between 7am to 6pm - Pager - [phone]  After 6pm go to [email or web address] - Social research officer, government  [facility]  Office  [phone]  CC: Primary care physician; Marisue Ivan, MD

## 2017-03-12 NOTE — Progress Notes (Signed)
[facility]  ROUNDING NOTE   Subjective:   UOP 1085  Creatinine 3.95  NS at 100  Tmax 102.2.   Objective:  Vital signs in last 24 hours:  Temp:  [99.5 F (37.5 C)-102.2 F (39 C)] 100.4 F (38 C) (01/31 1000) Pulse Rate:  [91-107] 105 (01/31 1000) Resp:  [14-24] 15 (01/31 1000) BP: (78-115)/(48-92) 104/62 (01/31 1000) SpO2:  [91 %-100 %] 98 % (01/31 1000) FiO2 (%):  [40 %] 40 % (01/31 1200) Weight:  [84.1 kg (185 lb 6.5 oz)] 84.1 kg (185 lb 6.5 oz) (01/30 1600)  Weight change:  Filed Weights   03/11/17 1225 03/11/17 1600  Weight: 79.8 kg (176 lb) 84.1 kg (185 lb 6.5 oz)    Intake/Output: I/O last 3 completed shifts: In: 7510 [I.V.:1404.3; NG/GT:60; IV Piggyback:291.7] Out: 1210 [Urine:1185; Emesis/NG output:25]   Intake/Output this shift:  Total I/O In: -  Out: 225 [Urine:225]  Physical Exam: General: Critically ill  Head: ETT, OGT  Eyes: Anicteric, PERRL  Neck: Supple, trachea midline  Lungs:  PRBC FiO2 40%  Heart: Regular rate and rhythm  Abdomen:  +distended  Extremities:  no edema peripheral edema.  Neurologic: Intubated, sedated  Skin: No lesions  GU: Foley with yellow urine    Basic Metabolic Panel: Recent Labs  Lab 03/11/17 1201 03/11/17 1531 03/12/17 0756  NA 148*  --  152*  K 5.3*  --  4.4  CL 111  --  120*  CO2 23  --  23  GLUCOSE 127*  --  97  BUN 92*  --  82*  CREATININE 4.97* 4.60* 3.92*  CALCIUM 9.1  --  8.2*    Liver Function Tests: Recent Labs  Lab 03/11/17 1201  AST 36  ALT 27  ALKPHOS 80  BILITOT 0.7  PROT 8.5*  ALBUMIN 3.0*   No results for input(s): LIPASE, AMYLASE in the last 168 hours. No results for input(s): AMMONIA in the last 168 hours.  CBC: Recent Labs  Lab 03/11/17 1201 03/12/17 0756  WBC 8.9 12.0*  NEUTROABS 7.1*  --   HGB 8.9* 7.5*  HCT 26.9* 23.5*  MCV 91.4 91.8  PLT 285 194    Cardiac Enzymes: Recent Labs  Lab 03/11/17 1201  TROPONINI 0.03*    BNP: Invalid  input(s): POCBNP  CBG: Recent Labs  Lab 03/11/17 1940 03/11/17 2329 03/12/17 0344 03/12/17 0729 03/12/17 1136  GLUCAP 172* 148* 132* 76 103*    Microbiology: Results for orders placed or performed during the hospital encounter of 03/11/17  Blood Culture (routine x 2)     Status: None (Preliminary result)   Collection Time: 03/11/17 12:01 PM  Result Value Ref Range Status   Specimen Description BLOOD LEFT WRIST  Final   Special Requests   Final    BOTTLES DRAWN AEROBIC AND ANAEROBIC Blood Culture adequate volume   Culture   Final    NO GROWTH < 24 HOURS Performed at [facility], [address] [place], [place] [address]    Report Status PENDING  Incomplete  Culture, respiratory (NON-Expectorated)     Status: None (Preliminary result)   Collection Time: 03/11/17  3:51 PM  Result Value Ref Range Status   Specimen Description   Final    TRACHEAL ASPIRATE Performed at [facility], [address] [place], [place] [address]    Special Requests   Final    NONE Performed at [facility], [address] [place], [place] [address]  Gram Stain   Final    FEW WBC PRESENT, PREDOMINANTLY PMN FEW YEAST Performed at [facility] [address], [place], [place] [address]    Culture MODERATE YEAST  Final   Report Status PENDING  Incomplete  MRSA PCR Screening     Status: None   Collection Time: 03/11/17  4:41 PM  Result Value Ref Range Status   MRSA by PCR NEGATIVE NEGATIVE Final    Comment:        The GeneXpert MRSA Assay (FDA approved for NASAL specimens only), is one component of a comprehensive MRSA colonization surveillance program. It is not intended to diagnose MRSA infection nor to guide or monitor treatment for MRSA infections. Performed at [facility], [address] [place], [place] [address]     Coagulation Studies: Recent Labs    03/11/17 1531  LABPROT 25.4*  INR 2.33    Urinalysis: Recent Labs     03/11/17 1236  COLORURINE YELLOW*  LABSPEC 1.013  PHURINE 5.0  GLUCOSEU NEGATIVE  HGBUR NEGATIVE  BILIRUBINUR NEGATIVE  KETONESUR NEGATIVE  PROTEINUR 30*  NITRITE NEGATIVE  LEUKOCYTESUR NEGATIVE      Imaging: Dg Abd 1 View  Result Date: 03/12/2017 CLINICAL DATA:  Abdominal distension EXAM: ABDOMEN - 1 VIEW COMPARISON:  03/11/2017 FINDINGS: Nasogastric tube with the tip projecting over the stomach. There is a moderate amount of stool throughout the colon. There is no bowel dilatation to suggest obstruction. There is no evidence of pneumoperitoneum, portal venous gas or pneumatosis. There are calcifications scattered throughout the spleen likely reflecting sequela prior granulomatous disease. There are no pathologic calcifications along the expected course of the ureters. There is right lower lobe airspace disease and to a lesser extent left lower lobe airspace disease concerning for pneumonia versus aspiration pneumonia. The osseous structures are unremarkable. IMPRESSION: 1. Nasogastric tube with the tip projecting over the stomach. No evidence of obstruction. 2. Moderate amount of stool throughout the colon. 3. Bilateral lower lobe airspace disease, right greater than left concerning for pneumonia versus aspiration pneumonia. Electronically Signed   By: Kathreen Devoid   On: 03/12/2017 10:32   Dg Abdomen 1 View  Result Date: 03/11/2017 CLINICAL DATA:  status post OG tube placement. EXAM: ABDOMEN - 1 VIEW COMPARISON:  11/15/2015 FINDINGS: The enteric tube tip is within the projection of the distal stomach. Side port below GE junction. No dilated loops of small bowel identified. IMPRESSION: 1. Tip of enteric tube in the projection of the distal stomach. Electronically Signed   By: Kerby Moors M.D.   On: 03/11/2017 12:59   Dg Chest Port 1 View  Result Date: 03/12/2017 CLINICAL DATA:  Respiratory distress EXAM: PORTABLE CHEST 1 VIEW COMPARISON:  Yesterday FINDINGS: Endotracheal tube tip  just below the clavicular heads. An orogastric tube reaches the stomach. Indistinct bilateral lung opacities greatest at the bases, progressed in density and extent. Normal heart size for technique. No edema, effusion, or pneumothorax. Artifact from EKG leads. Granulomatous calcifications in the spleen. IMPRESSION: 1. Progressive bilateral pneumonia. 2. Unremarkable positioning of endotracheal and orogastric tubes. Electronically Signed   By: Monte Fantasia M.D.   On: 03/12/2017 08:17   Dg Chest Portable 1 View  Result Date: 03/11/2017 CLINICAL DATA:  Status post intubation.  OG tube placement EXAM: PORTABLE CHEST 1 VIEW COMPARISON:  11/14/2016 FINDINGS: ET tube tip is above the carina. An enteric tube is not visualized. Normal heart size. No pleural effusion or edema. Bilateral lower lobe airspace opacities  are new from previous exam. IMPRESSION: 1. ET tube tip is in satisfactory position above the carina. 2. Nonvisualization of enteric tube. 3. Bilateral lower lobe airspace opacities. Electronically Signed   By: Kerby Moors M.D.   On: 03/11/2017 12:56     Medications:   . ceFEPime (MAXIPIME) IV Stopped (03/12/17 1021)  . dextrose 100 mL/hr at 03/12/17 0952  . famotidine (PEPCID) IV Stopped (03/11/17 2014)  . norepinephrine (LEVOPHED) Adult infusion Stopped (03/12/17 0701)  . propofol (DIPRIVAN) infusion 15 mcg/kg/min (03/12/17 0951)  . sodium chloride     . bisacodyl  10 mg Rectal Once  . chlorhexidine gluconate (MEDLINE KIT)  15 mL Mouth Rinse BID  . heparin  5,000 Units Subcutaneous Q8H  . insulin aspart  0-15 Units Subcutaneous Q4H  . mouth rinse  15 mL Mouth Rinse 10 times per day   acetaminophen **OR** acetaminophen, albuterol, bisacodyl, fentaNYL (SUBLIMAZE) injection, [DISCONTINUED] ondansetron **OR** ondansetron (ZOFRAN) IV  Assessment/ Plan:  [name] is a 84 y.o. black male with diabetes mellitus type II, hypertension, dementia, diabetic neuropathy, coronary  artery disease, CVA  1. Acute Renal Failure on chronic kidney disease stage IV with proteinuria: baseline creatinine of 2.39, GFR of 27 11/14/2016.  Acute renal failure secondary to sepsis and prerenal azotemia - Continue IV fluids. Renal function improving. Nonoliguric urine output.  - Wife states that patient is not interested in dialysis.   2. Sepsis: with pneumonia and acute respiratory failure requiring mechanical ventilation. Febrile.  - cefepime and vanco empirically.   3. Anemia with renal failure: hemoglobin 7.5  4. Hypernatremia: 3.6 liter free water deficit.  - Change to D5 1/2NS    LOS: 1 Boysie Bonebrake 1/31/201912:58 PM

## 2017-03-12 NOTE — Progress Notes (Addendum)
Initial Nutrition Assessment  DOCUMENTATION CODES:   Not applicable  INTERVENTION:  If abdominal distention has not worsened on 2/1 recommend initiating tube feeds at trickle rate.   Recommend initiating Vital AF 1.2 at 15 mL/hr via OGT and advancing by 20 mL/hr every 8 hours to goal rate of Vital AF 1.2 at 55 mL/hr (1320 mL goal daily volume). Provides 1584 kcal, 99 grams of protein, 1069 mL H2O daily. With current rates of propofol and D5W provides 2193 kcal (102% estimated needs; propofol providing 201 kcal and D5W providing 408 kcal).  NUTRITION DIAGNOSIS:   Inadequate oral intake related to inability to eat as evidenced by NPO status.  GOAL:   Provide needs based on ASPEN/SCCM guidelines  MONITOR:   Vent status, Labs, Weight trends, TF tolerance, I & O's  REASON FOR ASSESSMENT:   Ventilator    ASSESSMENT:   84 year old male with PMHx of advanced dementia, DM type 2, peripheral polyneuropathy, HTN, arthritis, CKD stage IV, hypercholesterolemia, glaucoma, hx DVT, CHF who [facility] after presumed aspiration event and abdomen was hard and distended. Found to have acute hypoxemic respiratory failure requiring intubation 1/30, AKI on CKD IV, chronic anemia without acute blood loss.   -Per chart after goals of care discussion with family plan is to not escalate care from current state (no ACLS, no HD) and remain on ventilatory support no longer than 3-4 days then decide on one-way vs terminal extubation.  No family members present at time of RD assessment. Patient is intubated and sedated. Abdomen is somewhat distended but not firm. Per chart he had a medium type 6 bowel movement per rectum this AM at 0400. Per findings from NFPE patient may be wheel-chair/bed-bound at baseline (muscle wasting only found in bilateral lower extremities). Per chart patient has been weight stable for the past year.  Access: 18 Fr. OGT placed 1/30; terminates in  stomach per abdominal x-ray 1/31; 60 cm at corner of mouth; currently to LIS with only 25 mL output documented so far (no accumulated output in cannister at time of RD assessment - just had small amount of fluid in tubing)  MAP: 59-75 mmHg; was never started on pressors  Patient is currently intubated on ventilator support MV: 10.9 L/min Temp (24hrs), Avg:100.7 F (38.2 C), Min:99.5 F (37.5 C), Max:102.2 F (39 C)  Propofol: 7.6 ml/hr (201 kcal daily)  Medications reviewed and include: Dulcolax suppository one tmie today, Novolog 0-15 units Q4hrs, cefepime, D5W @ 100 mL/hr (120 grams dextrose, 408 kcal daily), propofol gtt.  Labs reviewed: CBG 76-172, Sodium 152, Chloride 120, BUN 82, Creatinine 3.92.  Patient does not meet criteria for malnutrition.  Discussed with RN. Plan is to hold off on initiating tube feeds today as patient's abdomen was found to be more distended than yesterday. OGT currently to suction and on repeat abdominal x-ray this AM moderate amount of stool was found in colon.  NUTRITION - FOCUSED PHYSICAL EXAM:    Most Recent Value  Orbital Region  No depletion  Upper Arm Region  No depletion  Thoracic and Lumbar Region  No depletion  Buccal Region  Unable to assess  Temple Region  No depletion  Clavicle Bone Region  No depletion  Clavicle and Acromion Bone Region  No depletion  Scapular Bone Region  No depletion  Dorsal Hand  No depletion  Patellar Region  Moderate depletion  Anterior Thigh Region  Moderate depletion  Posterior Calf Region  Moderate depletion  Edema (  RD Assessment)  Mild  Hair  Reviewed  Eyes  Unable to assess  Mouth  Unable to assess  Skin  Reviewed  Nails  Reviewed     Diet Order:  No diet orders on file  EDUCATION NEEDS:   No education needs have been identified at this time  Skin:  Skin Assessment: Reviewed RN Assessment  Last BM:  03/12/2017 - medium type 6  Height:   Ht Readings from Last 1 Encounters:  03/11/17 5\' 11"   (1.803 m)    Weight:   Wt Readings from Last 1 Encounters:  03/11/17 185 lb 6.5 oz (84.1 kg)    Ideal Body Weight:  78.2 kg  BMI:  Body mass index is 25.86 kg/m.  Estimated Nutritional Needs:   Kcal:  2140 (PSU 2003b w/ MSJ 1564, Ve 10.9, Tmax 39)  Protein:  100-125 grams (1.2-1.5 grams/kg)  Fluid:  2 L/day (25 mL/kg IBW)  Helane Rima, MS, RD, LDN Office: [phone] Pager: [phone] After Hours/Weekend Pager: [phone]

## 2017-03-12 NOTE — Consult Note (Signed)
PULMONARY / CRITICAL CARE MEDICINE   Name: William Byrd MRN: [id number] DOB: 06/14/1931    ADMISSION DATE:  03/11/2017  PT PROFILE: [age] M with very advanced dementia, SNF resident, recently initiated on antibiotics for pneumonia, witnessed to have aspirated while being fed at SNF.  Transferred to [facility] ED where he was found to be minimally responsive with agonal respirations.  Intubated after discussions regarding goals of care with patient's wife.  MAJOR EVENTS/TEST RESULTS: 01/30 admission [facility] ICU after intubation  INDWELLING DEVICES:: ETT 01/30 >>   MICRO DATA: MRSA PCR 01/30 >> NEG Urine  01/30 >>  Resp 01/30 >>  Blood 01/30 >>   ANTIMICROBIALS:  Vanc 01/30 >> 01/31 Cefepime 01/30 >>      SUBJECTIVE:  RASS -3, not F/C. Synchronous with vent but biting on ETT during WUA  VITAL SIGNS: BP 101/64   Pulse 84   Temp 99.5 F (37.5 C)   Resp 18   Ht 5\' 11"  (1.803 m)   Wt 84.1 kg (185 lb 6.5 oz)   SpO2 99%   BMI 25.86 kg/m   HEMODYNAMICS:    VENTILATOR SETTINGS: Vent Mode: PRVC FiO2 (%):  [40 %] 40 % Set Rate:  [16 bmp] 16 bmp Vt Set:  [500 mL-800 mL] 500 mL PEEP:  [5 cmH20] 5 cmH20 Plateau Pressure:  [20 cmH20] 20 cmH20  INTAKE / OUTPUT: I/O last 3 completed shifts: In: 1756 [I.V.:1404.3; NG/GT:60; IV Piggyback:291.7] Out: 1210 [Urine:1185; Emesis/NG output:25]  PHYSICAL EXAMINATION: General: Intubated, sedated, minimally responsive Neuro: CN intact, moves all extremities HEENT: NCAT, sclerae white Cardiovascular: Regular, no M Lungs: Scattered rhonchi Abdomen: Distended, no tympani, diminished to absent BS Extremities: Warm, no edema  LABS:  BMET Recent Labs  Lab 03/11/17 1201 03/11/17 1531 03/12/17 0756  NA 148*  --  152*  K 5.3*  --  4.4  CL 111  --  120*  CO2 23  --  23  BUN 92*  --  82*  CREATININE 4.97* 4.60* 3.92*  GLUCOSE 127*  --  97    Electrolytes Recent Labs  Lab 03/11/17 1201 03/12/17 0756  CALCIUM 9.1 8.2*     CBC Recent Labs  Lab 03/11/17 1201 03/12/17 0756  WBC 8.9 12.0*  HGB 8.9* 7.5*  HCT 26.9* 23.5*  PLT 285 194    Coag's Recent Labs  Lab 03/11/17 1531  APTT 39*  INR 2.33    Sepsis Markers Recent Labs  Lab 03/11/17 1201 03/11/17 1531  LATICACIDVEN 1.9 2.2*  PROCALCITON  --  41.70    ABG Recent Labs  Lab 03/11/17 1620  PHART 7.37  PCO2ART 41  PO2ART PENDING    Liver Enzymes Recent Labs  Lab 03/11/17 1201  AST 36  ALT 27  ALKPHOS 80  BILITOT 0.7  ALBUMIN 3.0*    Cardiac Enzymes Recent Labs  Lab 03/11/17 1201  TROPONINI 0.03*    Glucose Recent Labs  Lab 03/11/17 1505 03/11/17 1940 03/11/17 2329 03/12/17 0344 03/12/17 0729 03/12/17 1136  GLUCAP 151* 172* 148* 132* 76 103*    CXR: R>L bibasilar AS dz  KUB: normal bowel gas pattern, moderate amount of stool in colon   ASSESSMENT / PLAN:  PULMONARY A: Acute hypoxemic respiratory failure Aspiration PNA P:   Cont vent support - settings reviewed and/or adjusted Cont vent bundle Daily SBT if/when meets criteria  Continue nebulized albuterol as needed  CARDIOVASCULAR A:  No acute issues P:  Monitor rhythm and BP MAP goal >  65 mmHg  DNR in event of cardiac arrest  RENAL A:   CKD AKI, nonoliguric -creatinine has improved slightly Mild hyperkalemia, resolved Hypernatremia P:   Monitor BMET intermittently Monitor I/Os Correct electrolytes as indicated Change IVF to D5W 1/31 Nephrology following.  It is agreed that he is not a candidate for dialysis  GASTROINTESTINAL A:   Abdominal distention Constipation/obstipation P:   SUP: IV famotidine Dulcolax suppository ordered 1/31 Consider TF protocol 2/01  HEMATOLOGIC A:   Chronic anemia without acute blood loss P:  DVT px: SQ heparin Monitor CBC intermittently Transfuse per usual guidelines   INFECTIOUS A:   Aspiration pneumonia P:   Monitor temp, WBC count Micro and abx as above   ENDOCRINE A:   Type 2  diabetes, controlled P:   Change SSI protocol to sensitive scale  NEUROLOGIC A:   Acute encephalopathy  Very advanced dementia ICU/ventilator associated discomfort P:   RASS goal: -1, -2 Continue PAD protocol with propofol infusion and intermittent fentanyl  GOALS OF CARE: I had a detailed discussion 01/30 with patient's wife discussing his chronic poor state of health and current critical illness.  We agreed that we would not escalate his level of care much from his current status.  Therefore, no ACLS, no HD.  We agreed to continue ventilatory support no longer than 3-4 days and consider one-way extubation or terminal extubation at that time.   CCM time: 35 mins  The above time includes time spent in consultation with patient and/or family members and reviewing care plan on multidisciplinary rounds  Billy Fischer, MD PCCM service Mobile [phone] Pager [phone]    03/12/2017, 2:44 PM

## 2017-03-12 NOTE — Plan of Care (Signed)
  Progressing Clinical Measurements: Ability to maintain clinical measurements within normal limits will improve 03/12/2017 0457 - Progressing by Hollice Espyoberson, Tonda Wiederhold V, RN Will remain free from infection 03/12/2017 0457 - Progressing by Hollice Espyoberson, Desera Graffeo V, RN Diagnostic test results will improve 03/12/2017 0457 - Progressing by Hollice Espyoberson, Benigna Delisi V, RN Respiratory complications will improve 03/12/2017 0457 - Progressing by Hollice Espyoberson, Katniss Weedman V, RN Cardiovascular complication will be avoided 03/12/2017 0457 - Progressing by Hollice Espyoberson, Geovana Gebel V, RN Activity: Risk for activity intolerance will decrease 03/12/2017 0457 - Progressing by Hollice Espyoberson, Judithe Keetch V, RN Coping: Level of anxiety will decrease 03/12/2017 0457 - Progressing by Hollice Espyoberson, Christie Viscomi V, RN Elimination: Will not experience complications related to bowel motility 03/12/2017 0457 - Progressing by Hollice Espyoberson, Brondon Wann V, RN Will not experience complications related to urinary retention 03/12/2017 0457 - Progressing by Hollice Espyoberson, Kateryn Marasigan V, RN Pain Managment: General experience of comfort will improve 03/12/2017 0457 - Progressing by Hollice Espyoberson, Ronetta Molla V, RN Safety: Ability to remain free from injury will improve 03/12/2017 0457 - Progressing by Hollice Espyoberson, Hedda Crumbley V, RN Skin Integrity: Risk for impaired skin integrity will decrease 03/12/2017 0457 - Progressing by Hollice Espyoberson, Randie Tallarico V, RN Clinical Measurements: Ability to maintain a body temperature in the normal range will improve 03/12/2017 0457 - Progressing by Hollice Espyoberson, Hollyann Pablo V, RN Respiratory: Ability to maintain adequate ventilation will improve 03/12/2017 0457 - Progressing by Hollice Espyoberson, Raynaldo Falco V, RN Ability to maintain a clear airway will improve 03/12/2017 0457 - Progressing by Hollice Espyoberson, Linley Moxley V, RN

## 2017-03-13 ENCOUNTER — Inpatient Hospital Stay: Payer: Medicare HMO

## 2017-03-13 ENCOUNTER — Encounter
Admission: RE | Admit: 2017-03-13 | Discharge: 2017-03-13 | Disposition: A | Discharge status: Home / Self Care | Payer: Medicare Other | Source: Ambulatory Visit | Attending: Internal Medicine | Admitting: Internal Medicine

## 2017-03-13 DIAGNOSIS — J9621 Acute and chronic respiratory failure with hypoxia: Secondary | ICD-10-CM

## 2017-03-13 LAB — GLUCOSE, CAPILLARY
GLUCOSE-CAPILLARY: 162 mg/dL — AB (ref 65–99)
GLUCOSE-CAPILLARY: 217 mg/dL — AB (ref 65–99)
GLUCOSE-CAPILLARY: 181 mg/dL — AB (ref 65–99)
Glucose-Capillary: 175 mg/dL — ABNORMAL HIGH (ref 65–99)
Glucose-Capillary: 200 mg/dL — ABNORMAL HIGH (ref 65–99)

## 2017-03-13 LAB — CBC
HCT: 23.4 % — ABNORMAL LOW (ref 40.0–52.0)
Hemoglobin: 7.6 g/dL — ABNORMAL LOW (ref 13.0–18.0)
MCH: 29.9 pg (ref 26.0–34.0)
MCHC: 32.4 g/dL (ref 32.0–36.0)
MCV: 92.1 fL (ref 80.0–100.0)
Platelets: 183 10*3/uL (ref 150–440)
RBC: 2.53 MIL/uL — ABNORMAL LOW (ref 4.40–5.90)
RDW: 18 % — ABNORMAL HIGH (ref 11.5–14.5)
WBC: 10.4 10*3/uL (ref 3.8–10.6)

## 2017-03-13 LAB — BASIC METABOLIC PANEL
Anion gap: 14 (ref 5–15)
BUN: 72 mg/dL — ABNORMAL HIGH (ref 6–20)
CO2: 21 mmol/L — ABNORMAL LOW (ref 22–32)
Calcium: 8.5 mg/dL — ABNORMAL LOW (ref 8.9–10.3)
Chloride: 117 mmol/L — ABNORMAL HIGH (ref 101–111)
Creatinine, Ser: 3.21 mg/dL — ABNORMAL HIGH (ref 0.61–1.24)
GFR calc Af Amer: 19 mL/min — ABNORMAL LOW (ref >60)
GFR calc non Af Amer: 16 mL/min — ABNORMAL LOW (ref >60)
Glucose, Bld: 199 mg/dL — ABNORMAL HIGH (ref 65–99)
Potassium: 4.1 mmol/L (ref 3.5–5.1)
Sodium: 152 mmol/L — ABNORMAL HIGH (ref 135–145)

## 2017-03-13 LAB — URINE CULTURE: CULTURE: NO GROWTH

## 2017-03-13 LAB — MAGNESIUM: Magnesium: 3 mg/dL — ABNORMAL HIGH (ref 1.7–2.4)

## 2017-03-13 LAB — PHOSPHORUS: Phosphorus: 3.2 mg/dL (ref 2.5–4.6)

## 2017-03-13 MED ORDER — FREE WATER
200.0000 mL | Freq: Three times a day (TID) | Status: AC
  Administered 2017-03-13 – 2017-03-15 (×8): 200 mL

## 2017-03-13 MED ORDER — DEXTROSE 5 % IV SOLN
INTRAVENOUS | Status: AC
  Administered 2017-03-13: 10:00:00 via INTRAVENOUS

## 2017-03-13 MED ORDER — VITAL AF 1.2 CAL PO LIQD
1000.0000 mL | ORAL | Status: DC
  Administered 2017-03-13: 1000 mL

## 2017-03-13 MED ORDER — SENNOSIDES-DOCUSATE SODIUM 8.6-50 MG PO TABS
1.0000 | ORAL_TABLET | Freq: Two times a day (BID) | ORAL | Status: DC
  Administered 2017-03-13 – 2017-03-16 (×6): 1
  Filled 2017-03-13 (×6): qty 1

## 2017-03-13 MED ORDER — NEPRO/CARBSTEADY PO LIQD: 1000.0000 mL | ORAL | Status: DC

## 2017-03-13 NOTE — Progress Notes (Signed)
Nutrition Brief Note  RD consulted to restart TFs on patient Discussed in rounds and with RN  Abd continues to be distended, but not firm. No other signs of TF intolerance prior to them being held yesterday.  ------ Plan: Begin VAF 1.2 at 3915mL/hr via OGT, advance by 220mL/hr every 8 to goal of 1555mL/hr (1320mL volume)  Provides 1584 calories, 99gm protein, 1069mL free water  With propofol at 7.6, D5 at 1400mL/hr, provides 2193 calories (102% estimated needs) 200mL free water Q8H ordered per nephrology --> 1669mL total free water  ------  Patient is currently intubated on ventilator support MV: 8.2 L/min Temp (24hrs), Avg:98.5 F (36.9 C), Min:96.8 F (36 C), Max:100.4 F (38 C)  Medications reviewed and include: Propofol: 7.6 ml/hr --> 200.64 calories D5 at 12500mL/hr --> 408 calories Off Pressors  Labs reviewed:  MAP: 64-73 Na 152, BUN/Creatinine 72/3.21   Intake/Output Summary (Last 24 hours) at 03/13/2017 1228 Last data filed at 03/13/2017 0830 Gross per 24 hour  Intake 2257.03 ml  Output 1315 ml  Net 942.03 ml   Dionne AnoWilliam M. Ceciley Buist, MS, RD LDN Inpatient Clinical Dietitian Pager [phone]

## 2017-03-13 NOTE — Progress Notes (Signed)
Inpatient Diabetes Program Recommendations  AACE/ADA: New Consensus Statement on Inpatient Glycemic Control (2015)  Target Ranges:  Prepandial:   less than 140 mg/dL      Peak postprandial:   less than 180 mg/dL (1-2 hours)      Critically ill patients:  140 - 180 mg/dL   Lab Results  Component Value Date   GLUCAP 162 (H) 03/13/2017   HGBA1C 8.5 (H) 03/03/2017    Review of Glycemic Control  Results for William Byrd, Kavian C (MRN [id number]) as of 03/13/2017 09:32  Ref. Range 03/12/2017 15:49 03/12/2017 19:49 03/12/2017 23:48 03/13/2017 03:54 03/13/2017 07:24  Glucose-Capillary Latest Ref Range: 65 - 99 mg/dL 409183 (H) 811202 (H) 914189 (H) 175 (H) 162 (H)    Diabetes history: Type 2 Outpatient Diabetes medications: Toujeo 60 units qam, Novolin R 3-12 units 4x/day  Current orders for Inpatient glycemic control: 0-15 units Novolog q4h  Inpatient Diabetes Program Recommendations:   Agree with current medications for blood sugar management.   Susette RacerJulie Kolbi Altadonna, RN, BA, MHA, CDE Diabetes Coordinator Inpatient Diabetes Program  [phone] (Team Pager) [phone] [facility] Office) 03/13/2017 9:37 AM

## 2017-03-13 NOTE — Progress Notes (Addendum)
[employer] [facility] [place] [facility]   PATIENT NAME: William Byrd    MR#:  [id number]  DATE OF BIRTH:  18-Feb-1931  SUBJECTIVE:  CHIEF COMPLAINT:   Chief Complaint  Patient presents with  . Pneumonia   -Intubated and sedated. Admitted for possible aspiration pneumonia. Critically ill appearing.  REVIEW OF SYSTEMS:  Review of Systems  Unable to perform ROS: Critical illness    DRUG ALLERGIES:   Allergies  Allergen Reactions  . Fosinopril Other (See Comments)    Other reaction(s): Other (See Comments) Hyperkalemia Reaction: Hyperkalemia     VITALS:  Blood pressure (!) 110/55, pulse 89, temperature 100 F (37.8 C), resp. rate 16, height 5\' 11"  (1.803 m), weight 84.1 kg (185 lb 6.5 oz), SpO2 95 %.  PHYSICAL EXAMINATION:  Physical Exam  GENERAL:  84 y.o.-year-old critically ill appearing patient lying in the bed, Intubated and sedated EYES: Pupils equal, round, reactive to light and accommodation. No scleral icterus.Marland Kitchen  HEENT: Head atraumatic, normocephalic. Oropharynx and nasopharynx clear.  NECK:  Supple, no jugular venous distention. No thyroid enlargement, no tenderness. OT tube present LUNGS: scant breath sounds bilaterally, no wheezing, rales  or crepitation. No use of accessory muscles of respiration. Decreased bibasilar breath sounds CARDIOVASCULAR: S1, S2 normal. No rubs, or gallops. 3/6 systolic murmur present ABDOMEN: Firm and distended. Nontender. Bowel sounds present. No organomegaly or mass.  EXTREMITIES: No pedal edema, cyanosis, or clubbing.  NEUROLOGIC: sedated, not following .  PSYCHIATRIC: The patient is intubated and sedated SKIN: No obvious rash, lesion, or ulcer.    LABORATORY PANEL:   CBC Recent Labs  Lab 03/13/17 0501  WBC 10.4  HGB 7.6*  HCT 23.4*  PLT 183   ------------------------------------------------------------------------------------------------------------------  Chemistries  Recent Labs  Lab  03/11/17 1201  03/13/17 0501  NA 148*   < > 152*  K 5.3*   < > 4.1  CL 111   < > 117*  CO2 23   < > 21*  GLUCOSE 127*   < > 199*  BUN 92*   < > 72*  CREATININE 4.97*   < > 3.21*  CALCIUM 9.1   < > 8.5*  AST 36  --   --   ALT 27  --   --   ALKPHOS 80  --   --   BILITOT 0.7  --   --    < > = values in this interval not displayed.   ------------------------------------------------------------------------------------------------------------------  Cardiac Enzymes Recent Labs  Lab 03/11/17 1201  TROPONINI 0.03*   ------------------------------------------------------------------------------------------------------------------  RADIOLOGY:  Dg Abd 1 View  Result Date: 03/12/2017 CLINICAL DATA:  Abdominal distension EXAM: ABDOMEN - 1 VIEW COMPARISON:  03/11/2017 FINDINGS: Nasogastric tube with the tip projecting over the stomach. There is a moderate amount of stool throughout the colon. There is no bowel dilatation to suggest obstruction. There is no evidence of pneumoperitoneum, portal venous gas or pneumatosis. There are calcifications scattered throughout the spleen likely reflecting sequela prior granulomatous disease. There are no pathologic calcifications along the expected course of the ureters. There is right lower lobe airspace disease and to a lesser extent left lower lobe airspace disease concerning for pneumonia versus aspiration pneumonia. The osseous structures are unremarkable. IMPRESSION: 1. Nasogastric tube with the tip projecting over the stomach. No evidence of obstruction. 2. Moderate amount of stool throughout the colon. 3. Bilateral lower lobe airspace disease, right greater than left concerning for pneumonia versus aspiration pneumonia. Electronically Signed   By: Alan Ripper  Patel   On: 03/12/2017 10:32   Dg Abdomen 1 View  Result Date: 03/11/2017 CLINICAL DATA:  status post OG tube placement. EXAM: ABDOMEN - 1 VIEW COMPARISON:  11/15/2015 FINDINGS: The enteric tube tip  is within the projection of the distal stomach. Side port below GE junction. No dilated loops of small bowel identified. IMPRESSION: 1. Tip of enteric tube in the projection of the distal stomach. Electronically Signed   By: Signa Kell M.D.   On: 03/11/2017 12:59   US Renal  Result Date: 03/12/2017 CLINICAL DATA:  Acute kidney failure EXAM: RENAL / URINARY TRACT ULTRASOUND COMPLETE COMPARISON:  None. FINDINGS: Right Kidney: Length: 9.9 cm. Echogenicity within normal limits. Small echogenic foci in the right kidney likely reflecting small nephrolithiasis with the largest measuring 9 mm. No mass or hydronephrosis visualized. Left Kidney: Length: 10.7 cm. Echogenicity within normal limits. Small echogenic focus in the left kidney likely reflecting small nephrolithiasis measuring 7 mm. No mass or hydronephrosis visualized. Bladder: Appears normal for degree of bladder distention. IMPRESSION: 1. No obstructive uropathy. 2. Bilateral nonobstructing nephrolithiasis. Electronically Signed   By: Elige Ko   On: 03/12/2017 14:52   Dg Chest Port 1 View  Result Date: 03/13/2017 CLINICAL DATA:  Followup ventilator support EXAM: PORTABLE CHEST 1 VIEW COMPARISON:  03/12/2017 FINDINGS: Endotracheal tube tip is 4 cm above the carina. Nasogastric tube enters the abdomen. Persistent bilateral lower lobe pneumonia, not significantly changed. One could question slightly worsened volume loss in the left lower lobe. No visible effusion. Multiple granulomas in the spleen again seen. IMPRESSION: Tubes well positioned. Persistent bilateral lower lobe pneumonia, possibly with more volume loss in the left lower lobe. Electronically Signed   By: Paulina Fusi M.D.   On: 03/13/2017 07:36   Dg Chest Port 1 View  Result Date: 03/12/2017 CLINICAL DATA:  Respiratory distress EXAM: PORTABLE CHEST 1 VIEW COMPARISON:  Yesterday FINDINGS: Endotracheal tube tip just below the clavicular heads. An orogastric tube reaches the stomach.  Indistinct bilateral lung opacities greatest at the bases, progressed in density and extent. Normal heart size for technique. No edema, effusion, or pneumothorax. Artifact from EKG leads. Granulomatous calcifications in the spleen. IMPRESSION: 1. Progressive bilateral pneumonia. 2. Unremarkable positioning of endotracheal and orogastric tubes. Electronically Signed   By: Marnee Spring M.D.   On: 03/12/2017 08:17   Dg Chest Portable 1 View  Result Date: 03/11/2017 CLINICAL DATA:  Status post intubation.  OG tube placement EXAM: PORTABLE CHEST 1 VIEW COMPARISON:  11/14/2016 FINDINGS: ET tube tip is above the carina. An enteric tube is not visualized. Normal heart size. No pleural effusion or edema. Bilateral lower lobe airspace opacities are new from previous exam. IMPRESSION: 1. ET tube tip is in satisfactory position above the carina. 2. Nonvisualization of enteric tube. 3. Bilateral lower lobe airspace opacities. Electronically Signed   By: Signa Kell M.D.   On: 03/11/2017 12:56    EKG:   Orders placed or performed during the hospital encounter of 03/11/17  . ED EKG 12-Lead  . ED EKG 12-Lead  . EKG 12-Lead  . EKG 12-Lead    ASSESSMENT AND PLAN:    84 year old male with multiple medical problems including advanced dementia who is bedbound at the nursing home at baseline, CKD, congestive heart failure, diabetes mellitus, hypertension, spinal stenosis and neuropathy presents to hospital secondary to unresponsive episode.  1.  Acute hypoxic respiratory failure-abdominal respirations on arrival.  Intubated for hypoxia and airway protection. -Continues on the ventilator.  Appreciate intensivist consult -On 40% FiO2 at this time.  Chest x-ray with bibasilar pneumonia.  Concern for aspiration. -Blood cultures are negative.  Continue cefepime at this time. -Vancomycin has been discontinued  2.  Acute renal failure on CKD stage IV-baseline creatinine of 2.3, now much worse secondary to sepsis  and prerenal azotemia -Family agree with no escalation of care and definitely no hemodialysis. -Continue fluids for now -Monitor urine output.  Renal ultrasound, avoid nephrotoxins -Slightly improved creatinine  3.  Sepsis-secondary to healthcare acquired pneumonia.  On ventilator, antibiotics as described above -Elevated pro-calcitonin -On low-dose pressors  4. Hyponatremia-free water deficit.  On D5 1/2 normal saline  5.  Acute on chronic anemia-has anemia of chronic kidney disease-no active bleeding.  Hemoglobin dropped from baseline of 9 to 7.5.  Partly dilutional and also acute illness. -Monitor closely, transfuse if hemoglobin is less than 7.  6.  DVT prophylaxis-on subcu heparin.  Hold if hemoglobin drop is significant  7. Nutrition- since abdomen is distended, currently has an NG tube for suction. Likely has ileus. Not on any tube feeds yet   Overall poor prognosis.  Intensivist had a family meeting with patient's family and they have agreed for DNR for now, no further escalation of care at this time.  Continue vent support for the next 2-3 days and further decisions based on patient change in clinical status.    All the records are reviewed and case discussed with Care Management/Social Workerr. Management plans discussed with the patient, family and they are in agreement.  CODE STATUS: DNR  TOTAL TIME TAKING CARE OF THIS PATIENT: 28 minutes.   POSSIBLE D/C IN 2-3 DAYS, DEPENDING ON CLINICAL CONDITION.   Enid BaasKALISETTI,Hrishikesh Hoeg M.D on 03/13/2017 at 10:27 AM  Between 7am to 6pm - Pager - [phone]  After 6pm go to [email or web address] - Social research officer, governmentpassword EPAS [facility]  [facility]  Office  [phone]  CC: Primary care physician; [name], [name], MD

## 2017-03-13 NOTE — Progress Notes (Signed)
[facility]  ROUNDING NOTE   Subjective:   Creatinine 3.21 (3.95)  UOP 1065  Objective:  Vital signs in last 24 hours:  Temp:  [96.8 F (36 C)-100.4 F (38 C)] 99.1 F (37.3 C) (02/01 0900) Pulse Rate:  [75-105] 85 (02/01 0900) Resp:  [13-21] 16 (02/01 0900) BP: (81-118)/(49-90) 100/55 (02/01 0900) SpO2:  [94 %-100 %] 96 % (02/01 0900) FiO2 (%):  [30 %-40 %] 30 % (02/01 0800)  Weight change:  Filed Weights   03/11/17 1225 03/11/17 1600  Weight: 79.8 kg (176 lb) 84.1 kg (185 lb 6.5 oz)    Intake/Output: I/O last 3 completed shifts: In: 2246.8 [I.V.:1795.1; NG/GT:60; IV Piggyback:391.7] Out: 2000 [Urine:1675; Emesis/NG output:325]   Intake/Output this shift:  Total I/O In: 1624 [I.V.:1624] Out: 175 [Urine:175]  Physical Exam: General: Critically ill  Head: ETT, OGT  Eyes: Anicteric, PERRL  Neck: Supple, trachea midline  Lungs:  PRBC FiO2 40%  Heart: Regular rate and rhythm  Abdomen:  +distended  Extremities:  no edema peripheral edema.  Neurologic: Intubated, sedated  Skin: No lesions  GU: Foley with yellow urine    Basic Metabolic Panel: Recent Labs  Lab 03/11/17 1201 03/11/17 1531 03/12/17 0756 03/13/17 0501  NA 148*  --  152* 152*  K 5.3*  --  4.4 4.1  CL 111  --  120* 117*  CO2 23  --  23 21*  GLUCOSE 127*  --  97 199*  BUN 92*  --  82* 72*  CREATININE 4.97* 4.60* 3.92* 3.21*  CALCIUM 9.1  --  8.2* 8.5*    Liver Function Tests: Recent Labs  Lab 03/11/17 1201  AST 36  ALT 27  ALKPHOS 80  BILITOT 0.7  PROT 8.5*  ALBUMIN 3.0*   No results for input(s): LIPASE, AMYLASE in the last 168 hours. No results for input(s): AMMONIA in the last 168 hours.  CBC: Recent Labs  Lab 03/11/17 1201 03/12/17 0756 03/13/17 0501  WBC 8.9 12.0* 10.4  NEUTROABS 7.1*  --   --   HGB 8.9* 7.5* 7.6*  HCT 26.9* 23.5* 23.4*  MCV 91.4 91.8 92.1  PLT 285 194 183    Cardiac Enzymes: Recent Labs  Lab 03/11/17 1201  TROPONINI 0.03*     BNP: Invalid input(s): POCBNP  CBG: Recent Labs  Lab 03/12/17 1549 03/12/17 1949 03/12/17 2348 03/13/17 0354 03/13/17 0724  GLUCAP 183* 202* 189* 175* 162*    Microbiology: Results for orders placed or performed during the hospital encounter of 03/11/17  Blood Culture (routine x 2)     Status: None (Preliminary result)   Collection Time: 03/11/17 12:01 PM  Result Value Ref Range Status   Specimen Description BLOOD LEFT WRIST  Final   Special Requests   Final    BOTTLES DRAWN AEROBIC AND ANAEROBIC Blood Culture adequate volume   Culture   Final    NO GROWTH 2 DAYS Performed at [facility], [address], [place], [place] [address]    Report Status PENDING  Incomplete  Urine culture     Status: None   Collection Time: 03/11/17 12:36 PM  Result Value Ref Range Status   Specimen Description   Final    URINE, RANDOM Performed at [facility], [address] [place], [place] [address]    Special Requests   Final    NONE Performed at [facility], [address], [place], [place] [address]    Culture   Final  NO GROWTH Performed at [facility], [place] [address] [place], [place] [address]    Report Status 03/13/2017 FINAL  Final  Culture, respiratory (NON-Expectorated)     Status: None (Preliminary result)   Collection Time: 03/11/17  3:51 PM  Result Value Ref Range Status   Specimen Description   Final    TRACHEAL ASPIRATE Performed at [facility], [address] [place], [place] [address]    Special Requests   Final    NONE Performed at [facility], [address] [place], [place] [address]    Gram Stain   Final    FEW WBC PRESENT, PREDOMINANTLY PMN FEW YEAST Performed at [facility], [place] [address], [place], [place] [address]    Culture MODERATE YEAST  Final   Report Status PENDING  Incomplete  MRSA PCR Screening     Status: None   Collection Time: 03/11/17  4:41 PM  Result Value  Ref Range Status   MRSA by PCR NEGATIVE NEGATIVE Final    Comment:        The GeneXpert MRSA Assay (FDA approved for NASAL specimens only), is one component of a comprehensive MRSA colonization surveillance program. It is not intended to diagnose MRSA infection nor to guide or monitor treatment for MRSA infections. Performed at [facility], [address] [place], [place] [address]     Coagulation Studies: Recent Labs    03/11/17 1531  LABPROT 25.4*  INR 2.33    Urinalysis: Recent Labs    03/11/17 1236  COLORURINE YELLOW*  LABSPEC 1.013  PHURINE 5.0  GLUCOSEU NEGATIVE  HGBUR NEGATIVE  BILIRUBINUR NEGATIVE  KETONESUR NEGATIVE  PROTEINUR 30*  NITRITE NEGATIVE  LEUKOCYTESUR NEGATIVE      Imaging: Dg Abd 1 View  Result Date: 03/12/2017 CLINICAL DATA:  Abdominal distension EXAM: ABDOMEN - 1 VIEW COMPARISON:  03/11/2017 FINDINGS: Nasogastric tube with the tip projecting over the stomach. There is a moderate amount of stool throughout the colon. There is no bowel dilatation to suggest obstruction. There is no evidence of pneumoperitoneum, portal venous gas or pneumatosis. There are calcifications scattered throughout the spleen likely reflecting sequela prior granulomatous disease. There are no pathologic calcifications along the expected course of the ureters. There is right lower lobe airspace disease and to a lesser extent left lower lobe airspace disease concerning for pneumonia versus aspiration pneumonia. The osseous structures are unremarkable. IMPRESSION: 1. Nasogastric tube with the tip projecting over the stomach. No evidence of obstruction. 2. Moderate amount of stool throughout the colon. 3. Bilateral lower lobe airspace disease, right greater than left concerning for pneumonia versus aspiration pneumonia. Electronically Signed   By: Kathreen Devoid   On: 03/12/2017 10:32   Dg Abdomen 1 View  Result Date: 03/11/2017 CLINICAL DATA:  status post OG tube  placement. EXAM: ABDOMEN - 1 VIEW COMPARISON:  11/15/2015 FINDINGS: The enteric tube tip is within the projection of the distal stomach. Side port below GE junction. No dilated loops of small bowel identified. IMPRESSION: 1. Tip of enteric tube in the projection of the distal stomach. Electronically Signed   By: Kerby Moors M.D.   On: 03/11/2017 12:59   US Renal  Result Date: 03/12/2017 CLINICAL DATA:  Acute kidney failure EXAM: RENAL / URINARY TRACT ULTRASOUND COMPLETE COMPARISON:  None. FINDINGS: Right Kidney: Length: 9.9 cm. Echogenicity within normal limits. Small echogenic foci in the right kidney likely reflecting small nephrolithiasis with the largest measuring 9 mm. No mass  or hydronephrosis visualized. Left Kidney: Length: 10.7 cm. Echogenicity within normal limits. Small echogenic focus in the left kidney likely reflecting small nephrolithiasis measuring 7 mm. No mass or hydronephrosis visualized. Bladder: Appears normal for degree of bladder distention. IMPRESSION: 1. No obstructive uropathy. 2. Bilateral nonobstructing nephrolithiasis. Electronically Signed   By: Kathreen Devoid   On: 03/12/2017 14:52   Dg Chest Port 1 View  Result Date: 03/13/2017 CLINICAL DATA:  Followup ventilator support EXAM: PORTABLE CHEST 1 VIEW COMPARISON:  03/12/2017 FINDINGS: Endotracheal tube tip is 4 cm above the carina. Nasogastric tube enters the abdomen. Persistent bilateral lower lobe pneumonia, not significantly changed. One could question slightly worsened volume loss in the left lower lobe. No visible effusion. Multiple granulomas in the spleen again seen. IMPRESSION: Tubes well positioned. Persistent bilateral lower lobe pneumonia, possibly with more volume loss in the left lower lobe. Electronically Signed   By: Nelson Chimes M.D.   On: 03/13/2017 07:36   Dg Chest Port 1 View  Result Date: 03/12/2017 CLINICAL DATA:  Respiratory distress EXAM: PORTABLE CHEST 1 VIEW COMPARISON:  Yesterday FINDINGS:  Endotracheal tube tip just below the clavicular heads. An orogastric tube reaches the stomach. Indistinct bilateral lung opacities greatest at the bases, progressed in density and extent. Normal heart size for technique. No edema, effusion, or pneumothorax. Artifact from EKG leads. Granulomatous calcifications in the spleen. IMPRESSION: 1. Progressive bilateral pneumonia. 2. Unremarkable positioning of endotracheal and orogastric tubes. Electronically Signed   By: Monte Fantasia M.D.   On: 03/12/2017 08:17   Dg Chest Portable 1 View  Result Date: 03/11/2017 CLINICAL DATA:  Status post intubation.  OG tube placement EXAM: PORTABLE CHEST 1 VIEW COMPARISON:  11/14/2016 FINDINGS: ET tube tip is above the carina. An enteric tube is not visualized. Normal heart size. No pleural effusion or edema. Bilateral lower lobe airspace opacities are new from previous exam. IMPRESSION: 1. ET tube tip is in satisfactory position above the carina. 2. Nonvisualization of enteric tube. 3. Bilateral lower lobe airspace opacities. Electronically Signed   By: Kerby Moors M.D.   On: 03/11/2017 12:56     Medications:   . ceFEPime (MAXIPIME) IV Stopped (03/12/17 1021)  . dextrose 5 % and 0.45% NaCl 100 mL/hr at 03/13/17 0001  . famotidine (PEPCID) IV Stopped (03/12/17 1930)  . norepinephrine (LEVOPHED) Adult infusion Stopped (03/12/17 0701)  . propofol (DIPRIVAN) infusion 30 mcg/kg/min (03/13/17 0827)  . sodium chloride     . chlorhexidine gluconate (MEDLINE KIT)  15 mL Mouth Rinse BID  . heparin  5,000 Units Subcutaneous Q8H  . insulin aspart  0-15 Units Subcutaneous Q4H  . mouth rinse  15 mL Mouth Rinse 10 times per day   acetaminophen **OR** acetaminophen, albuterol, bisacodyl, fentaNYL (SUBLIMAZE) injection, [DISCONTINUED] ondansetron **OR** ondansetron (ZOFRAN) IV  Assessment/ Plan:  [name] is a 84 y.o. black male with diabetes mellitus type II, hypertension, dementia, diabetic neuropathy,  coronary artery disease, CVA  1. Acute Renal Failure on chronic kidney disease stage IV with proteinuria: baseline creatinine of 2.39, GFR of 27 11/14/2016.  Acute renal failure secondary to sepsis and prerenal azotemia - Continue IV fluids. Renal function improving. Nonoliguric urine output.  - Wife states that patient is not interested in dialysis.   2. Sepsis: with pneumonia and acute respiratory failure requiring mechanical ventilation. Febrile.  - cefepime and vanco empirically.   3. Anemia with renal failure: hemoglobin 7.6  4. Hypernatremia: 3.6 liter free water deficit.  - Change to  D5 at 15m - Start tube feeds with free water    LOS: 2 Iola Turri 2/1/20199:57 AM

## 2017-03-13 NOTE — Progress Notes (Signed)
MEDICATION RELATED CONSULT NOTE  Pharmacy Consult for electrolyte/glucose/constipation monitoring   Assessment: 84 yo M who presented to the hospital secondary to unresponsive episode at nursing home. Patient is in acute renal failure with CKD stage IV and prerenal azotemia, family is refusing hemodialysis.   Plan:  1. Discontinued D5-1/2NS  2/1 @ 1000 Initiated D5W at 100 mL/hr 2/1 @ 1400 Initiated free water 200 mL per tube q8h Will recheck electrolytes with AM labs  2. Continue Novolog 0-15 units sliding scale q4h  3. Patient has no bowel movement since admission on 1/30 Continue Ducolax 10 mg suppository daily as needed for constipation. Will add senna/docusate 1 tab BID.  Will continue to assess appropriateness of bowel regimen.  Pharmacy will continue to monitor and adjust per consult.    Allergies  Allergen Reactions  . Fosinopril Other (See Comments)    Other reaction(s): Other (See Comments) Hyperkalemia Reaction: Hyperkalemia     Patient Measurements: Height: 5\' 11"  (180.3 cm) Weight: 185 lb 6.5 oz (84.1 kg) IBW/kg (Calculated) : 75.3  Vital Signs: Temp: 100.4 F (38 C) (02/01 1200) Temp Source: Core (Comment) (02/01 0800) BP: 106/58 (02/01 1200) Pulse Rate: 93 (02/01 1200) Intake/Output from previous day: 01/31 0701 - 02/01 0700 In: 633.1 [I.V.:533.1; IV Piggyback:100] Out: 1365 [Urine:1065; Emesis/NG output:300] Intake/Output from this shift: Total I/O In: 1624 [I.V.:1624] Out: 350 [Urine:350]  Labs: Recent Labs    03/11/17 1201 03/11/17 1531 03/12/17 0756 03/13/17 0501  WBC 8.9  --  12.0* 10.4  HGB 8.9*  --  7.5* 7.6*  HCT 26.9*  --  23.5* 23.4*  PLT 285  --  194 183  APTT  --  39*  --   --   CREATININE 4.97* 4.60* 3.92* 3.21*  MG  --   --   --  3.0*  PHOS  --   --   --  3.2  ALBUMIN 3.0*  --   --   --   PROT 8.5*  --   --   --   AST 36  --   --   --   ALT 27  --   --   --   ALKPHOS 80  --   --   --   BILITOT 0.7  --   --   --     Estimated Creatinine Clearance: 18.6 mL/min (A) (by C-G formula based on SCr of 3.21 mg/dL (H)).  Medical History: Past Medical History:  Diagnosis Date  . Arthritis   . Background retinopathy   . Cervicalgia   . Chronic kidney disease    follow up with a Dr.   . Chronic systolic CHF (congestive heart failure) (HCC)   . Congenital heart failure (HCC)   . Degeneration of lumbar or lumbosacral intervertebral disc   . Dementia   . Diabetes mellitus type 2, uncomplicated (HCC) 1996   on insulin since 2011, last eye exam with Dr. Inez PilgrimBrasington 07/2013  . Diabetes mellitus without complication (HCC)   . DVT (deep venous thrombosis) (HCC)   . Essential hypertension    unspecified  . Generalized weakness   . Glaucoma   . Hypercholesteremia   . Hypertension   . Long-term insulin use (HCC)   . Moderate dementia   . Peripheral polyneuropathy   . Peripheral vascular disease (HCC)   . Pneumonia, unspecified organism 04/01/2014  . Pure hypercholesterolemia   . Spinal stenosis   . Spinal stenosis, lumbar region without neurogenic claudication     Medications:  Medications Prior to Admission  Medication Sig Dispense Refill Last Dose  . acetaminophen (TYLENOL) 325 MG tablet Take 650 mg by mouth every 6 (six) hours as needed for mild pain, moderate pain, fever or headache.   PRN at PRN  . albuterol (PROVENTIL HFA;VENTOLIN HFA) 108 (90 Base) MCG/ACT inhaler Inhale 2 puffs into the lungs every 4 (four) hours as needed for wheezing or shortness of breath.   PRN at PRN  . amLODipine (NORVASC) 10 MG tablet Take 1 tablet (10 mg total) by mouth daily. 30 tablet 0 03/10/2017 at 0900  . benzonatate (TESSALON) 100 MG capsule Take 200 mg by mouth every 8 (eight) hours as needed for cough.   PRN at PRN  . bisacodyl (DULCOLAX) 5 MG EC tablet Take 1 tablet (5 mg total) by mouth daily as needed for moderate constipation. 30 tablet 0 PRN at PRN  . cefTRIAXone (ROCEPHIN) IVPB Inject 2 g into the vein once.    03/10/2017 at 1330  . cholecalciferol (VITAMIN D) 1000 UNITS tablet Take 1,000 Units by mouth daily.   03/10/2017 at 0900  . docusate sodium (STOOL SOFTENER) 100 MG capsule Take 100 mg by mouth daily as needed for mild constipation or moderate constipation.    PRN at PRN  . donepezil (ARICEPT) 10 MG tablet Take 10 mg by mouth at bedtime.    03/09/2017 at 2300  . DULoxetine (CYMBALTA) 20 MG capsule Take 20 mg by mouth daily.   03/10/2017 at 0900  . finasteride (PROSCAR) 5 MG tablet Take 5 mg by mouth daily. *staff : do not handle without gloves*   03/10/2017 at 0900  . furosemide (LASIX) 20 MG tablet Take 2 tablets (40 mg) by mouth daily each morning. Take an additional 1 tablet (20 mg) by mouth  6 hours later.   03/10/2017 at 1730  . glucagon (GLUCAGON EMERGENCY) 1 MG injection Inject 1 mg into the vein once as needed.   PRN at PRN  . GLUCERNA (GLUCERNA) LIQD Take 1 Can by mouth daily.    UTD at UTD  . HYDROcodone-acetaminophen (NORCO/VICODIN) 5-325 MG tablet Take 1 tablet by mouth every 6 (six) hours as needed for moderate pain. 20 tablet 0 PRN at PRN  . Insulin Glargine (TOUJEO SOLOSTAR) 300 UNIT/ML SOPN Inject 60 Units into the skin every morning. 8 am    03/10/2017 at 0900  . insulin regular (NOVOLIN R,HUMULIN R) 100 units/mL injection Inject 3-12 Units into the skin 4 (four) times daily -  before meals and at bedtime. If BS < 60 call NP/PA, If BS is 175 to 250 give 3 units, 251 to 325 give 6 units, 326 to 450, give 10 units, if BS > 450 give 12 units and call NP/PA, check bs ac&hs, if < 175 document 0 units    03/10/2017 at 2015  . ipratropium-albuterol (DUONEB) 0.5-2.5 (3) MG/3ML SOLN Take 3 mLs by nebulization as needed.    PRN at PRN  . memantine (NAMENDA XR) 28 MG CP24 24 hr capsule Take 28 mg by mouth daily.    03/10/2017 at 0900  . metoprolol tartrate (LOPRESSOR) 25 MG tablet Take 1 tablet (25 mg total) by mouth 2 (two) times daily.   03/10/2017 at 0900  . OXYGEN Inhale 2 L into the lungs as needed.  Check o2 sat prior to placing on patient and at least every 4 hours after applying   UTD at UTD  . pantoprazole (PROTONIX) 20 MG tablet Take 20 mg by mouth daily.  03/10/2017 at 0530  . pravastatin (PRAVACHOL) 10 MG tablet Take 10 mg by mouth daily.   03/08/2017 at 1700  . pregabalin (LYRICA) 75 MG capsule Take 1 capsule (75 mg total) by mouth 2 (two) times daily. 60 capsule 4 03/10/2017 at 0900  . Probiotic Product (RISA-BID PROBIOTIC) TABS Take 1 tablet by mouth 2 (two) times daily.   UTD at UTD  . rivaroxaban (XARELTO) 20 MG TABS tablet Take 1 tablet by mouth daily.    03/10/2017 at 1730  . sacubitril-valsartan (ENTRESTO) 49-51 MG Take 1 tablet by mouth 2 (two) times daily.   03/10/2017 at 0900  . senna (SENOKOT) 8.6 MG TABS tablet Take 1 tablet (8.6 mg total) by mouth daily. 100 each 0 03/10/2017 at 0900  . tamsulosin (FLOMAX) 0.4 MG CAPS capsule Take 0.4 mg by mouth at bedtime.   03/09/2017 at 2300  . timolol (TIMOPTIC) 0.5 % ophthalmic solution Place 1 drop into both eyes 2 (two) times daily.    03/10/2017 at 2015  . Wound Dressings (TRIAD HYDROPHILIC WOUND DRESSI) PSTE Apply cream topically to macerated areas of buttocks daily and prn   UTD at UTD    [place] Gerber  Pharmacy Student 03/13/2017,2:12 PM

## 2017-03-13 NOTE — Progress Notes (Signed)
PULMONARY / CRITICAL CARE MEDICINE   Name: William Byrd MRN: [id number] DOB: 12/02/1931    ADMISSION DATE:  03/11/2017    HISTORY OF PRESENT ILLNESS:   [age] M with very advanced dementia, SNF resident, recently initiated on antibiotics for pneumonia, witnessed to have aspirated while being fed at SNF.  [facility] ED where he was found to be minimally responsive with agonal respirations.  Intubated after discussions regarding goals of care with patient's wife.   SUBJECTIVE:  No acute events in the night, still on vent  VITAL SIGNS: BP (!) 106/58   Pulse 93   Temp (!) 100.4 F (38 C)   Resp 14   Ht 5\' 11"  (1.803 m)   Wt 185 lb 6.5 oz (84.1 kg)   SpO2 95%   BMI 25.86 kg/m   HEMODYNAMICS:    VENTILATOR SETTINGS: Vent Mode: PRVC FiO2 (%):  [30 %-40 %] 30 % Set Rate:  [16 bmp] 16 bmp Vt Set:  [500 mL] 500 mL PEEP:  [5 cmH20] 5 cmH20  INTAKE / OUTPUT: I/O last 3 completed shifts: In: 2246.8 [I.V.:1795.1; NG/GT:60; IV Piggyback:391.7] Out: 2000 [Urine:1675; Emesis/NG output:325]  PHYSICAL EXAMINATION: General:  Remains critically ill on mechanical ventilation Neuro:  Sedated on Propofol HEENT: PERRL Cardiovascular:  RRR Lungs:  Good A/E at apex. Decrease at base with rhonchi Abdomen:  Distended, inaudible BS Musculoskeletal:  Trace oedema Skin:  Warm and dry  LABS:  BMET Recent Labs  Lab 03/11/17 1201 03/11/17 1531 03/12/17 0756 03/13/17 0501  NA 148*  --  152* 152*  K 5.3*  --  4.4 4.1  CL 111  --  120* 117*  CO2 23  --  23 21*  BUN 92*  --  82* 72*  CREATININE 4.97* 4.60* 3.92* 3.21*  GLUCOSE 127*  --  97 199*    Electrolytes Recent Labs  Lab 03/11/17 1201 03/12/17 0756 03/13/17 0501  CALCIUM 9.1 8.2* 8.5*  MG  --   --  3.0*  PHOS  --   --  3.2    CBC Recent Labs  Lab 03/11/17 1201 03/12/17 0756 03/13/17 0501  WBC 8.9 12.0* 10.4  HGB 8.9* 7.5* 7.6*  HCT 26.9* 23.5* 23.4*  PLT 285 194 183    Coag's Recent Labs  Lab  03/11/17 1531  APTT 39*  INR 2.33    Sepsis Markers Recent Labs  Lab 03/11/17 1201 03/11/17 1531  LATICACIDVEN 1.9 2.2*  PROCALCITON  --  41.70    ABG Recent Labs  Lab 03/11/17 1620  PHART 7.37  PCO2ART 41  PO2ART PENDING    Liver Enzymes Recent Labs  Lab 03/11/17 1201  AST 36  ALT 27  ALKPHOS 80  BILITOT 0.7  ALBUMIN 3.0*    Cardiac Enzymes Recent Labs  Lab 03/11/17 1201  TROPONINI 0.03*    Glucose Recent Labs  Lab 03/12/17 1949 03/12/17 2348 03/13/17 0354 03/13/17 0724 03/13/17 1134 03/13/17 1632  GLUCAP 202* 189* 175* 162* 200* 217*    Imaging Dg Chest Port 1 View  Result Date: 03/13/2017 CLINICAL DATA:  Followup ventilator support EXAM: PORTABLE CHEST 1 VIEW COMPARISON:  03/12/2017 FINDINGS: Endotracheal tube tip is 4 cm above the carina. Nasogastric tube enters the abdomen. Persistent bilateral lower lobe pneumonia, not significantly changed. One could question slightly worsened volume loss in the left lower lobe. No visible effusion. Multiple granulomas in the spleen again seen. IMPRESSION: Tubes well positioned. Persistent bilateral lower lobe pneumonia, possibly with more volume loss  in the left lower lobe. Electronically Signed   By: Paulina Fusi M.D.   On: 03/13/2017 07:36     ASSESSMENT / PLAN:  PULMONARY A: Acute Respiratory Failure on vent support secondary to Aspiration Pneumonia, Not weanable at this time P:   Continue present vent settings. FIO2 now at 30% Continue lung protective strategy ventilation Continue on lung recruitment protocol  CARDIOVASCULAR A: No active issues P:  Monitor rhythm and BP MAP goal >65 mmHg  DNR in event of cardiac arrest  RENAL A:   CKD AKI, nonoliguric -creatinine has improved slightly Mild hyperkalemia, resolved Hypernatremia P:   IV and enteral free water has been increased Monitor I/Os Correct electrolytes as indicated Nephrology following.  It is agreed that he is not a candidate  for dialysis  GASTROINTESTINAL A:   Abdominal distention Constipation/obstipation P:   SUP: IV famotidine Dulcolax suppository ordered 1/31 TF started today  HEMATOLOGIC A:   Critical Illness anemia P:  Transfuse PRBC for hemoglobin < 7  INFECTIOUS A:   Aspiration Pneumonia on vent support P:   Continue to trend WBC and pyrexia Continue ABX to course  ENDOCRINE A:   Type 2 DM with hyperglycemia since starting TF P:   Continue Target Glucose monitoring and coverage  NEUROLOGIC A:   Acute Metabolic Encephalopathy Hx of Advance Dementia P:   RASS goal: 1-2 Will decrease Propofol for better Mentation    FAMILY  - Will be updated when available.   Disp: Patient is a DNR without escalation of care.   I have dedicated a total of 35 minutes in critical care time minus all appropriate exclusions.    Jackson Latino, MD Pulmonary and Critical Care Medicine [facility] Pager: [phone]  03/13/2017, 4:37 PM

## 2017-03-14 LAB — GLUCOSE, CAPILLARY
GLUCOSE-CAPILLARY: 155 mg/dL — AB (ref 65–99)
GLUCOSE-CAPILLARY: 159 mg/dL — AB (ref 65–99)
GLUCOSE-CAPILLARY: 195 mg/dL — AB (ref 65–99)
GLUCOSE-CAPILLARY: 257 mg/dL — AB (ref 65–99)
Glucose-Capillary: 147 mg/dL — ABNORMAL HIGH (ref 65–99)
Glucose-Capillary: 189 mg/dL — ABNORMAL HIGH (ref 65–99)
Glucose-Capillary: 230 mg/dL — ABNORMAL HIGH (ref 65–99)

## 2017-03-14 LAB — CBC
HCT: 21.5 % — ABNORMAL LOW (ref 40.0–52.0)
Hemoglobin: 7.2 g/dL — ABNORMAL LOW (ref 13.0–18.0)
MCH: 30.6 pg (ref 26.0–34.0)
MCHC: 33.5 g/dL (ref 32.0–36.0)
MCV: 91.5 fL (ref 80.0–100.0)
Platelets: 179 10*3/uL (ref 150–440)
RBC: 2.35 MIL/uL — AB (ref 4.40–5.90)
RDW: 17.6 % — ABNORMAL HIGH (ref 11.5–14.5)
WBC: 8.7 10*3/uL (ref 3.8–10.6)

## 2017-03-14 LAB — BASIC METABOLIC PANEL
ANION GAP: 10 (ref 5–15)
ANION GAP: 8 (ref 5–15)
BUN: 65 mg/dL — ABNORMAL HIGH (ref 6–20)
BUN: 70 mg/dL — ABNORMAL HIGH (ref 6–20)
CALCIUM: 8 mg/dL — AB (ref 8.9–10.3)
CO2: 19 mmol/L — ABNORMAL LOW (ref 22–32)
CO2: 20 mmol/L — ABNORMAL LOW (ref 22–32)
CREATININE: 3.11 mg/dL — AB (ref 0.61–1.24)
Calcium: 8 mg/dL — ABNORMAL LOW (ref 8.9–10.3)
Chloride: 117 mmol/L — ABNORMAL HIGH (ref 101–111)
Chloride: 117 mmol/L — ABNORMAL HIGH (ref 101–111)
Creatinine, Ser: 2.99 mg/dL — ABNORMAL HIGH (ref 0.61–1.24)
GFR, EST AFRICAN AMERICAN: 20 mL/min — AB (ref >60)
GFR, EST AFRICAN AMERICAN: 21 mL/min — AB (ref >60)
GFR, EST NON AFRICAN AMERICAN: 17 mL/min — AB (ref >60)
GFR, EST NON AFRICAN AMERICAN: 18 mL/min — AB (ref >60)
Glucose, Bld: 230 mg/dL — ABNORMAL HIGH (ref 65–99)
Glucose, Bld: 283 mg/dL — ABNORMAL HIGH (ref 65–99)
POTASSIUM: 3.9 mmol/L (ref 3.5–5.1)
Potassium: 4.1 mmol/L (ref 3.5–5.1)
SODIUM: 145 mmol/L (ref 135–145)
SODIUM: 146 mmol/L — AB (ref 135–145)

## 2017-03-14 LAB — PHOSPHORUS: PHOSPHORUS: 2.8 mg/dL (ref 2.5–4.6)

## 2017-03-14 LAB — CULTURE, RESPIRATORY

## 2017-03-14 LAB — CULTURE, RESPIRATORY W GRAM STAIN

## 2017-03-14 LAB — MAGNESIUM: Magnesium: 2.9 mg/dL — ABNORMAL HIGH (ref 1.7–2.4)

## 2017-03-14 LAB — TRIGLYCERIDES: TRIGLYCERIDES: 406 mg/dL — AB (ref <150)

## 2017-03-14 MED ORDER — DEXTROSE 5 % IV SOLN
INTRAVENOUS | Status: DC
  Administered 2017-03-14 – 2017-03-15 (×2): via INTRAVENOUS

## 2017-03-14 MED ORDER — VITAL AF 1.2 CAL PO LIQD
1000.0000 mL | ORAL | Status: DC
  Administered 2017-03-14 – 2017-03-15 (×2): 1000 mL

## 2017-03-14 MED ORDER — BISACODYL 10 MG RE SUPP
10.0000 mg | Freq: Once | RECTAL | Status: AC
  Administered 2017-03-14: 10 mg via RECTAL
  Filled 2017-03-14: qty 1

## 2017-03-14 MED ORDER — LACTULOSE 10 GM/15ML PO SOLN
30.0000 g | Freq: Once | ORAL | Status: AC
  Administered 2017-03-14: 30 g via ORAL
  Filled 2017-03-14: qty 60

## 2017-03-14 MED ORDER — FLEET ENEMA 7-19 GM/118ML RE ENEM: 1.0000 | ENEMA | Freq: Once | RECTAL | Status: DC

## 2017-03-14 MED ORDER — POLYETHYLENE GLYCOL 3350 17 G PO PACK: 17.0000 g | PACK | Freq: Every day | ORAL | Status: DC

## 2017-03-14 NOTE — Progress Notes (Signed)
[facility]  ROUNDING NOTE   Subjective:   Tube feeds held due to stool impaction.   Creatinine 2.99 (3.11) Na 146  Off IV fluids  Objective:  Vital signs in last 24 hours:  Temp:  [98.8 F (37.1 C)-100.9 F (38.3 C)] 98.8 F (37.1 C) (02/02 0715) Pulse Rate:  [84-109] 91 (02/02 0715) Resp:  [13-26] 16 (02/02 0715) BP: (94-127)/(50-82) 94/52 (02/02 0700) SpO2:  [93 %-100 %] 97 % (02/02 0715) FiO2 (%):  [30 %] 30 % (02/02 0436)  Weight change:  Filed Weights   03/11/17 1225 03/11/17 1600  Weight: 79.8 kg (176 lb) 84.1 kg (185 lb 6.5 oz)    Intake/Output: I/O last 3 completed shifts: In: 4451.6 [I.V.:3924.3; NG/GT:477.3; IV Piggyback:50] Out: 2475 [Urine:1895; Emesis/NG output:580]   Intake/Output this shift:  Total I/O In: 152.5 [I.V.:152.5] Out: 500 [Urine:500]  Physical Exam: General: Critically ill  Head: ETT, OGT  Eyes: Anicteric, PERRL  Neck: Supple, trachea midline  Lungs:  PRBC FiO2 30%  Heart: Regular rate and rhythm  Abdomen:  +distended  Extremities:  no edema peripheral edema.  Neurologic: Intubated, sedated  Skin: No lesions  GU: Foley with yellow urine    Basic Metabolic Panel: Recent Labs  Lab 03/11/17 1201 03/11/17 1531 03/12/17 0756 03/13/17 0501 03/13/17 2356 03/14/17 0348  NA 148*  --  152* 152* 145 146*  K 5.3*  --  4.4 4.1 4.1 3.9  CL 111  --  120* 117* 117* 117*  CO2 23  --  23 21* 20* 19*  GLUCOSE 127*  --  97 199* 283* 230*  BUN 92*  --  82* 72* 70* 65*  CREATININE 4.97* 4.60* 3.92* 3.21* 3.11* 2.99*  CALCIUM 9.1  --  8.2* 8.5* 8.0* 8.0*  MG  --   --   --  3.0* 2.9*  --   PHOS  --   --   --  3.2 2.8  --     Liver Function Tests: Recent Labs  Lab 03/11/17 1201  AST 36  ALT 27  ALKPHOS 80  BILITOT 0.7  PROT 8.5*  ALBUMIN 3.0*   No results for input(s): LIPASE, AMYLASE in the last 168 hours. No results for input(s): AMMONIA in the last 168 hours.  CBC: Recent Labs  Lab 03/11/17 1201  03/12/17 0756 03/13/17 0501 03/14/17 0348  WBC 8.9 12.0* 10.4 8.7  NEUTROABS 7.1*  --   --   --   HGB 8.9* 7.5* 7.6* 7.2*  HCT 26.9* 23.5* 23.4* 21.5*  MCV 91.4 91.8 92.1 91.5  PLT 285 194 183 179    Cardiac Enzymes: Recent Labs  Lab 03/11/17 1201  TROPONINI 0.03*    BNP: Invalid input(s): POCBNP  CBG: Recent Labs  Lab 03/13/17 1632 03/13/17 1944 03/14/17 0021 03/14/17 0417 03/14/17 0749  GLUCAP 217* 181* 257* 230* 159*    Microbiology: Results for orders placed or performed during the hospital encounter of 03/11/17  Blood Culture (routine x 2)     Status: None (Preliminary result)   Collection Time: 03/11/17 12:01 PM  Result Value Ref Range Status   Specimen Description BLOOD LEFT WRIST  Final   Special Requests   Final    BOTTLES DRAWN AEROBIC AND ANAEROBIC Blood Culture adequate volume   Culture   Final    NO GROWTH 3 DAYS Performed at [facility], [address], [place], [place] [address]    Report Status PENDING  Incomplete  Urine culture  Status: None   Collection Time: 03/11/17 12:36 PM  Result Value Ref Range Status   Specimen Description   Final    URINE, RANDOM Performed at [facility], [address], [place], [place] [address]    Special Requests   Final    NONE Performed at [facility], [address], [place], [place] [address]    Culture   Final    NO GROWTH Performed at [facility], [place] [address], [place], [place] [address]    Report Status 03/13/2017 FINAL  Final  Culture, respiratory (NON-Expectorated)     Status: None (Preliminary result)   Collection Time: 03/11/17  3:51 PM  Result Value Ref Range Status   Specimen Description   Final    TRACHEAL ASPIRATE Performed at [facility], [address], [place], [place] [address]    Special Requests   Final    NONE Performed at [facility], [address] [place], [place] [address]    Gram Stain   Final    FEW WBC  PRESENT, PREDOMINANTLY PMN FEW YEAST Performed at [facility], [place] [address], [place], [place] [address]    Culture MODERATE YEAST  Final   Report Status PENDING  Incomplete  MRSA PCR Screening     Status: None   Collection Time: 03/11/17  4:41 PM  Result Value Ref Range Status   MRSA by PCR NEGATIVE NEGATIVE Final    Comment:        The GeneXpert MRSA Assay (FDA approved for NASAL specimens only), is one component of a comprehensive MRSA colonization surveillance program. It is not intended to diagnose MRSA infection nor to guide or monitor treatment for MRSA infections. Performed at [facility], [address] [place], [place] [address]     Coagulation Studies: Recent Labs    03/11/17 1531  LABPROT 25.4*  INR 2.33    Urinalysis: Recent Labs    03/11/17 1236  COLORURINE YELLOW*  LABSPEC 1.013  PHURINE 5.0  GLUCOSEU NEGATIVE  HGBUR NEGATIVE  BILIRUBINUR NEGATIVE  KETONESUR NEGATIVE  PROTEINUR 30*  NITRITE NEGATIVE  LEUKOCYTESUR NEGATIVE      Imaging: Dg Abd 1 View  Result Date: 03/14/2017 CLINICAL DATA:  Abdominal distension. EXAM: ABDOMEN - 1 VIEW COMPARISON:  Radiographs of March 12, 2017. FINDINGS: The bowel gas pattern is normal. Large amount of stool is noted in the rectum. Distal tip of nasogastric tube is seen projected over distal stomach. No abnormal calcifications are noted. IMPRESSION: No evidence of bowel obstruction or ileus. Large amount of stool is noted in the rectum. Electronically Signed   By: Marijo Conception, M.D.   On: 03/14/2017 02:11   Dg Abd 1 View  Result Date: 03/12/2017 CLINICAL DATA:  Abdominal distension EXAM: ABDOMEN - 1 VIEW COMPARISON:  03/11/2017 FINDINGS: Nasogastric tube with the tip projecting over the stomach. There is a moderate amount of stool throughout the colon. There is no bowel dilatation to suggest obstruction. There is no evidence of pneumoperitoneum, portal venous gas or pneumatosis. There are  calcifications scattered throughout the spleen likely reflecting sequela prior granulomatous disease. There are no pathologic calcifications along the expected course of the ureters. There is right lower lobe airspace disease and to a lesser extent left lower lobe airspace disease concerning for pneumonia versus aspiration pneumonia. The osseous structures are unremarkable. IMPRESSION: 1. Nasogastric tube with the tip projecting over the stomach. No evidence of obstruction. 2. Moderate amount of  stool throughout the colon. 3. Bilateral lower lobe airspace disease, right greater than left concerning for pneumonia versus aspiration pneumonia. Electronically Signed   By: Kathreen Devoid   On: 03/12/2017 10:32   US Renal  Result Date: 03/12/2017 CLINICAL DATA:  Acute kidney failure EXAM: RENAL / URINARY TRACT ULTRASOUND COMPLETE COMPARISON:  None. FINDINGS: Right Kidney: Length: 9.9 cm. Echogenicity within normal limits. Small echogenic foci in the right kidney likely reflecting small nephrolithiasis with the largest measuring 9 mm. No mass or hydronephrosis visualized. Left Kidney: Length: 10.7 cm. Echogenicity within normal limits. Small echogenic focus in the left kidney likely reflecting small nephrolithiasis measuring 7 mm. No mass or hydronephrosis visualized. Bladder: Appears normal for degree of bladder distention. IMPRESSION: 1. No obstructive uropathy. 2. Bilateral nonobstructing nephrolithiasis. Electronically Signed   By: Kathreen Devoid   On: 03/12/2017 14:52   Dg Chest Port 1 View  Result Date: 03/13/2017 CLINICAL DATA:  Followup ventilator support EXAM: PORTABLE CHEST 1 VIEW COMPARISON:  03/12/2017 FINDINGS: Endotracheal tube tip is 4 cm above the carina. Nasogastric tube enters the abdomen. Persistent bilateral lower lobe pneumonia, not significantly changed. One could question slightly worsened volume loss in the left lower lobe. No visible effusion. Multiple granulomas in the spleen again seen.  IMPRESSION: Tubes well positioned. Persistent bilateral lower lobe pneumonia, possibly with more volume loss in the left lower lobe. Electronically Signed   By: Nelson Chimes M.D.   On: 03/13/2017 07:36     Medications:   . ceFEPime (MAXIPIME) IV Stopped (03/13/17 1049)  . dextrose Stopped (03/14/17 0719)  . famotidine (PEPCID) IV Stopped (03/13/17 1844)  . feeding supplement (VITAL AF 1.2 CAL) Stopped (03/14/17 0000)  . norepinephrine (LEVOPHED) Adult infusion Stopped (03/12/17 0701)  . propofol (DIPRIVAN) infusion 10 mcg/kg/min (03/14/17 0841)  . sodium chloride     . chlorhexidine gluconate (MEDLINE KIT)  15 mL Mouth Rinse BID  . free water  200 mL Per Tube Q8H  . heparin  5,000 Units Subcutaneous Q8H  . insulin aspart  0-15 Units Subcutaneous Q4H  . mouth rinse  15 mL Mouth Rinse 10 times per day  . senna-docusate  1 tablet Per Tube BID  . sodium phosphate  1 enema Rectal Once   acetaminophen **OR** acetaminophen, albuterol, bisacodyl, fentaNYL (SUBLIMAZE) injection, [DISCONTINUED] ondansetron **OR** ondansetron (ZOFRAN) IV  Assessment/ Plan:  [name] is a 84 y.o. black male with diabetes mellitus type II, hypertension, dementia, diabetic neuropathy, coronary artery disease, CVA  1. Acute Renal Failure on chronic kidney disease stage IV with proteinuria: baseline creatinine of 2.39, GFR of 27 11/14/2016.  Acute renal failure secondary to sepsis and prerenal azotemia - Continue IV fluids: D5W at 55m. Renal function improving. Nonoliguric urine output.  - Wife states that patient is not interested in dialysis.   2. Sepsis: with pneumonia and acute respiratory failure requiring mechanical ventilation. Febrile. Wbc trending down  - cefepime and vanco empirically.   3. Anemia with renal failure: hemoglobin 7.2  4. Hypernatremia: 1.7 liter free water deficit.  - Continue D5W at 544m- Start free water with tube feeds when able to tolerate.    LOS: 3 William Byrd 2/2/20199:33 AM

## 2017-03-14 NOTE — Progress Notes (Signed)
MEDICATION RELATED CONSULT NOTE  Pharmacy Consult for electrolyte/glucose/constipation monitoring   Assessment: 84 yo M who presented to the hospital secondary to unresponsive episode at nursing home. Patient is in acute renal failure with CKD stage IV and prerenal azotemia, family is refusing hemodialysis.   Plan:  1. K = 3.9 2/1 @ 1000 Initiated D5W at 100 mL/hr 2/1 @ 1400 Initiated free water 200 mL per tube q8h Will recheck electrolytes with AM labs  2. Continue Novolog 0-15 units sliding scale q4h  3. Bowel movement documented today 2/2.  Continue Ducolax 10 mg suppository daily as needed for constipation. Will add senna/docusate 1 tab BID.  Will continue to assess appropriateness of bowel regimen.  Pharmacy will continue to monitor and adjust per consult.    Allergies  Allergen Reactions  . Fosinopril Other (See Comments)    Other reaction(s): Other (See Comments) Hyperkalemia Reaction: Hyperkalemia     Patient Measurements: Height: 5\' 11"  (180.3 cm) Weight: 185 lb 6.5 oz (84.1 kg) IBW/kg (Calculated) : 75.3  Vital Signs: Temp: 98.8 F (37.1 C) (02/02 0715) Temp Source: Core (02/02 0400) BP: 94/52 (02/02 0700) Pulse Rate: 91 (02/02 0715) Intake/Output from previous day: 02/01 0701 - 02/02 0700 In: 4451.6 [I.V.:3924.3; NG/GT:477.3; IV Piggyback:50] Out: 1610 [RUEAV:4098; Emesis/NG output:380] Intake/Output from this shift: No intake/output data recorded.  Labs: Recent Labs    03/11/17 1201 03/11/17 1531 03/12/17 0756 03/13/17 0501 03/13/17 2356 03/14/17 0348  WBC 8.9  --  12.0* 10.4  --  8.7  HGB 8.9*  --  7.5* 7.6*  --  7.2*  HCT 26.9*  --  23.5* 23.4*  --  21.5*  PLT 285  --  194 183  --  179  APTT  --  39*  --   --   --   --   CREATININE 4.97* 4.60* 3.92* 3.21* 3.11* 2.99*  MG  --   --   --  3.0* 2.9*  --   PHOS  --   --   --  3.2 2.8  --   ALBUMIN 3.0*  --   --   --   --   --   PROT 8.5*  --   --   --   --   --   AST 36  --   --   --   --    --   ALT 27  --   --   --   --   --   ALKPHOS 80  --   --   --   --   --   BILITOT 0.7  --   --   --   --   --    Estimated Creatinine Clearance: 19.9 mL/min (A) (by C-G formula based on SCr of 2.99 mg/dL (H)).  Medical History: Past Medical History:  Diagnosis Date  . Arthritis   . Background retinopathy   . Cervicalgia   . Chronic kidney disease    follow up with a Dr.   . Chronic systolic CHF (congestive heart failure) (HCC)   . Congenital heart failure (HCC)   . Degeneration of lumbar or lumbosacral intervertebral disc   . Dementia   . Diabetes mellitus type 2, uncomplicated (HCC) 1996   on insulin since 2011, last eye exam with Dr. Inez Pilgrim 07/2013  . Diabetes mellitus without complication (HCC)   . DVT (deep venous thrombosis) (HCC)   . Essential hypertension    unspecified  . Generalized weakness   .  Glaucoma   . Hypercholesteremia   . Hypertension   . Long-term insulin use (HCC)   . Moderate dementia   . Peripheral polyneuropathy   . Peripheral vascular disease (HCC)   . Pneumonia, unspecified organism 04/01/2014  . Pure hypercholesterolemia   . Spinal stenosis   . Spinal stenosis, lumbar region without neurogenic claudication     Medications:  Medications Prior to Admission  Medication Sig Dispense Refill Last Dose  . acetaminophen (TYLENOL) 325 MG tablet Take 650 mg by mouth every 6 (six) hours as needed for mild pain, moderate pain, fever or headache.   PRN at PRN  . albuterol (PROVENTIL HFA;VENTOLIN HFA) 108 (90 Base) MCG/ACT inhaler Inhale 2 puffs into the lungs every 4 (four) hours as needed for wheezing or shortness of breath.   PRN at PRN  . amLODipine (NORVASC) 10 MG tablet Take 1 tablet (10 mg total) by mouth daily. 30 tablet 0 03/10/2017 at 0900  . benzonatate (TESSALON) 100 MG capsule Take 200 mg by mouth every 8 (eight) hours as needed for cough.   PRN at PRN  . bisacodyl (DULCOLAX) 5 MG EC tablet Take 1 tablet (5 mg total) by mouth daily as needed  for moderate constipation. 30 tablet 0 PRN at PRN  . cefTRIAXone (ROCEPHIN) IVPB Inject 2 g into the vein once.   03/10/2017 at 1330  . cholecalciferol (VITAMIN D) 1000 UNITS tablet Take 1,000 Units by mouth daily.   03/10/2017 at 0900  . docusate sodium (STOOL SOFTENER) 100 MG capsule Take 100 mg by mouth daily as needed for mild constipation or moderate constipation.    PRN at PRN  . donepezil (ARICEPT) 10 MG tablet Take 10 mg by mouth at bedtime.    03/09/2017 at 2300  . DULoxetine (CYMBALTA) 20 MG capsule Take 20 mg by mouth daily.   03/10/2017 at 0900  . finasteride (PROSCAR) 5 MG tablet Take 5 mg by mouth daily. *staff : do not handle without gloves*   03/10/2017 at 0900  . furosemide (LASIX) 20 MG tablet Take 2 tablets (40 mg) by mouth daily each morning. Take an additional 1 tablet (20 mg) by mouth  6 hours later.   03/10/2017 at 1730  . glucagon (GLUCAGON EMERGENCY) 1 MG injection Inject 1 mg into the vein once as needed.   PRN at PRN  . GLUCERNA (GLUCERNA) LIQD Take 1 Can by mouth daily.    UTD at UTD  . HYDROcodone-acetaminophen (NORCO/VICODIN) 5-325 MG tablet Take 1 tablet by mouth every 6 (six) hours as needed for moderate pain. 20 tablet 0 PRN at PRN  . Insulin Glargine (TOUJEO SOLOSTAR) 300 UNIT/ML SOPN Inject 60 Units into the skin every morning. 8 am    03/10/2017 at 0900  . insulin regular (NOVOLIN R,HUMULIN R) 100 units/mL injection Inject 3-12 Units into the skin 4 (four) times daily -  before meals and at bedtime. If BS < 60 call NP/PA, If BS is 175 to 250 give 3 units, 251 to 325 give 6 units, 326 to 450, give 10 units, if BS > 450 give 12 units and call NP/PA, check bs ac&hs, if < 175 document 0 units    03/10/2017 at 2015  . ipratropium-albuterol (DUONEB) 0.5-2.5 (3) MG/3ML SOLN Take 3 mLs by nebulization as needed.    PRN at PRN  . memantine (NAMENDA XR) 28 MG CP24 24 hr capsule Take 28 mg by mouth daily.    03/10/2017 at 0900  . metoprolol tartrate (LOPRESSOR) 25  MG tablet Take 1  tablet (25 mg total) by mouth 2 (two) times daily.   03/10/2017 at 0900  . OXYGEN Inhale 2 L into the lungs as needed. Check o2 sat prior to placing on patient and at least every 4 hours after applying   UTD at UTD  . pantoprazole (PROTONIX) 20 MG tablet Take 20 mg by mouth daily.   03/10/2017 at 0530  . pravastatin (PRAVACHOL) 10 MG tablet Take 10 mg by mouth daily.   03/08/2017 at 1700  . pregabalin (LYRICA) 75 MG capsule Take 1 capsule (75 mg total) by mouth 2 (two) times daily. 60 capsule 4 03/10/2017 at 0900  . Probiotic Product (RISA-BID PROBIOTIC) TABS Take 1 tablet by mouth 2 (two) times daily.   UTD at UTD  . rivaroxaban (XARELTO) 20 MG TABS tablet Take 1 tablet by mouth daily.    03/10/2017 at 1730  . sacubitril-valsartan (ENTRESTO) 49-51 MG Take 1 tablet by mouth 2 (two) times daily.   03/10/2017 at 0900  . senna (SENOKOT) 8.6 MG TABS tablet Take 1 tablet (8.6 mg total) by mouth daily. 100 each 0 03/10/2017 at 0900  . tamsulosin (FLOMAX) 0.4 MG CAPS capsule Take 0.4 mg by mouth at bedtime.   03/09/2017 at 2300  . timolol (TIMOPTIC) 0.5 % ophthalmic solution Place 1 drop into both eyes 2 (two) times daily.    03/10/2017 at 2015  . Wound Dressings (TRIAD HYDROPHILIC WOUND DRESSI) PSTE Apply cream topically to macerated areas of buttocks daily and prn   UTD at UTD    Katai Marsico K, [facility] 03/14/2017,8:33 AM

## 2017-03-14 NOTE — Plan of Care (Signed)
Patient remains on ventilator.  No acute distress noted. Patient tolerating vent but coughing. Patient given suppository.  Propofol is continued at this time.  Will continue to monitor.

## 2017-03-14 NOTE — Progress Notes (Signed)
PULMONARY / CRITICAL CARE MEDICINE   Name: William Byrd MRN: [id number] DOB: 02/03/1932    ADMISSION DATE:  03/11/2017    HISTORY OF PRESENT ILLNESS:   [age] M with very advanced dementia, SNF resident, recently initiated on antibiotics for pneumonia, witnessed to have aspirated while being fed at SNF.  [facility] where he was found to be minimally responsive with agonal respirations.  Intubated after discussions regarding goals of care with patient's wife.   SUBJECTIVE:  No acute events in the night, still on vent  VITAL SIGNS: BP (!) 158/109 (BP Location: Right Arm)   Pulse (!) 114   Temp 99.9 F (37.7 C) (Core (Comment)) Comment (Src): foley pribe  Resp (!) 26   Ht 5\' 11"  (1.803 m)   Wt 185 lb 6.5 oz (84.1 kg)   SpO2 92%   BMI 25.86 kg/m   HEMODYNAMICS:    VENTILATOR SETTINGS: Vent Mode: PRVC FiO2 (%):  [30 %] 30 % Set Rate:  [16 bmp] 16 bmp Vt Set:  [500 mL] 500 mL PEEP:  [5 cmH20] 5 cmH20 Pressure Support:  [5 cmH20] 5 cmH20  INTAKE / OUTPUT: I/O last 3 completed shifts: In: 4451.6 [I.V.:3924.3; NG/GT:477.3; IV Piggyback:50] Out: 2475 [Urine:1895; Emesis/NG output:580]  PHYSICAL EXAMINATION: General:  Remains critically ill on mechanical ventilation Neuro:  Sedated on Propofol HEENT: PERRL Cardiovascular:  RRR Lungs:  Good A/E at apex. Decrease at base with rhonchi Abdomen:  Distended, inaudible BS Musculoskeletal:  Trace oedema Skin:  Warm and dry  LABS:  BMET Recent Labs  Lab 03/13/17 0501 03/13/17 2356 03/14/17 0348  NA 152* 145 146*  K 4.1 4.1 3.9  CL 117* 117* 117*  CO2 21* 20* 19*  BUN 72* 70* 65*  CREATININE 3.21* 3.11* 2.99*  GLUCOSE 199* 283* 230*    Electrolytes Recent Labs  Lab 03/13/17 0501 03/13/17 2356 03/14/17 0348  CALCIUM 8.5* 8.0* 8.0*  MG 3.0* 2.9*  --   PHOS 3.2 2.8  --     CBC Recent Labs  Lab 03/12/17 0756 03/13/17 0501 03/14/17 0348  WBC 12.0* 10.4 8.7  HGB 7.5* 7.6* 7.2*  HCT 23.5* 23.4*  21.5*  PLT 194 183 179    Coag's Recent Labs  Lab 03/11/17 1531  APTT 39*  INR 2.33    Sepsis Markers Recent Labs  Lab 03/11/17 1201 03/11/17 1531  LATICACIDVEN 1.9 2.2*  PROCALCITON  --  41.70    ABG Recent Labs  Lab 03/11/17 1620  PHART 7.37  PCO2ART 41  PO2ART PENDING    Liver Enzymes Recent Labs  Lab 03/11/17 1201  AST 36  ALT 27  ALKPHOS 80  BILITOT 0.7  ALBUMIN 3.0*    Cardiac Enzymes Recent Labs  Lab 03/11/17 1201  TROPONINI 0.03*    Glucose Recent Labs  Lab 03/13/17 1944 03/14/17 0021 03/14/17 0417 03/14/17 0749 03/14/17 1131 03/14/17 1551  GLUCAP 181* 257* 230* 159* 155* 195*    Imaging Dg Abd 1 View  Result Date: 03/14/2017 CLINICAL DATA:  Abdominal distension. EXAM: ABDOMEN - 1 VIEW COMPARISON:  Radiographs of March 12, 2017. FINDINGS: The bowel gas pattern is normal. Large amount of stool is noted in the rectum. Distal tip of nasogastric tube is seen projected over distal stomach. No abnormal calcifications are noted. IMPRESSION: No evidence of bowel obstruction or ileus. Large amount of stool is noted in the rectum. Electronically Signed   By: Lupita RaiderJames  Green Jr, M.D.   On: 03/14/2017 02:11  ASSESSMENT / PLAN:  PULMONARY A: Acute Respiratory Failure on vent support secondary to Aspiration Pneumonia, Not weanable at this time P:   Continue present vent settings. FIO2 now at 30% Continue lung protective strategy ventilation Continue on lung recruitment protocol  CARDIOVASCULAR A: No active issues P:  Monitor rhythm and BP MAP goal >65 mmHg  DNR in event of cardiac arrest  RENAL A:   CKD AKI, nonoliguric -creatinine continues to improve Hypernatremia- improving Hyperchloriemia P:   Continue on IV and enteral free water Monitor I/Os Correct electrolytes as indicated Nephrology following.  It is agreed that he is not a candidate for dialysis  GASTROINTESTINAL A:   Abdominal  distention Constipation/obstipation P:   SUP: IV famotidine Dulcolax suppository ordered 1/31 Continue on bowel regimen  HEMATOLOGIC A:   Critical Illness anemia P:  Transfuse PRBC for hemoglobin < 7  INFECTIOUS A:   Aspiration Pneumonia on vent support P:   Leukocytosis has reolved Continue ABX to course  ENDOCRINE A:   Type 2 DM with hyperglycemia since starting TF P:   Continue Target Glucose monitoring and coverage  NEUROLOGIC A:   Acute Metabolic Encephalopathy- improving off sedatives Hx of Advance Dementia P:   RASS goal: 1-2 Will try to avoid sedatives if possible    FAMILY  - Wife and daughter updated   Disp: Patient is a DNR without escalation of care.   I have dedicated a total of 35 minutes in critical care time minus all appropriate exclusions.    Jackson Latino, MD Pulmonary and Critical Care Medicine [facility] Pager: [phone]  03/14/2017, 4:48 PM

## 2017-03-14 NOTE — Progress Notes (Signed)
[employer] - [place] [facility]   PATIENT NAME: William Byrd    MR#:  [id number]  DATE OF BIRTH:  07-20-1931  SUBJECTIVE:  CHIEF COMPLAINT:   Chief Complaint  Patient presents with  . Pneumonia   -Intubated and sedated. Admitted for possible aspiration pneumonia. Critically ill appearing.   Tapering sedation for now.  REVIEW OF SYSTEMS:  Review of Systems  Unable to perform ROS: Critical illness    DRUG ALLERGIES:   Allergies  Allergen Reactions  . Fosinopril Other (See Comments)    Other reaction(s): Other (See Comments) Hyperkalemia Reaction: Hyperkalemia     VITALS:  Blood pressure 130/62, pulse 86, temperature 99.9 F (37.7 C), resp. rate 12, height 5\' 11"  (1.803 m), weight 84.1 kg (185 lb 6.5 oz), SpO2 97 %.  PHYSICAL EXAMINATION:  Physical Exam  GENERAL:  84 y.o.-year-old critically ill appearing patient lying in the bed, Intubated and sedated EYES: Pupils equal, round, reactive to light and accommodation. No scleral icterus.Marland Kitchen  HEENT: Head atraumatic, normocephalic. Oropharynx and nasopharynx clear.  NECK:  Supple, no jugular venous distention. No thyroid enlargement, no tenderness. OT tube present LUNGS: scant breath sounds bilaterally, no wheezing, rales  or crepitation. No use of accessory muscles of respiration. Decreased bibasilar breath sounds CARDIOVASCULAR: S1, S2 normal. No rubs, or gallops. 3/6 systolic murmur present ABDOMEN: Firm and distended. Nontender. Bowel sounds present. No organomegaly or mass.  EXTREMITIES: No pedal edema, cyanosis, or clubbing.  NEUROLOGIC: Tapering off sedation, patient opens eyes to calling her name .  PSYCHIATRIC: The patient is intubated and sedated SKIN: No obvious rash, lesion, or ulcer.    LABORATORY PANEL:   CBC Recent Labs  Lab 03/14/17 0348  WBC 8.7  HGB 7.2*  HCT 21.5*  PLT 179    ------------------------------------------------------------------------------------------------------------------  Chemistries  Recent Labs  Lab 03/11/17 1201  03/13/17 2356 03/14/17 0348  NA 148*   < > 145 146*  K 5.3*   < > 4.1 3.9  CL 111   < > 117* 117*  CO2 23   < > 20* 19*  GLUCOSE 127*   < > 283* 230*  BUN 92*   < > 70* 65*  CREATININE 4.97*   < > 3.11* 2.99*  CALCIUM 9.1   < > 8.0* 8.0*  MG  --    < > 2.9*  --   AST 36  --   --   --   ALT 27  --   --   --   ALKPHOS 80  --   --   --   BILITOT 0.7  --   --   --    < > = values in this interval not displayed.   ------------------------------------------------------------------------------------------------------------------  Cardiac Enzymes Recent Labs  Lab 03/11/17 1201  TROPONINI 0.03*   ------------------------------------------------------------------------------------------------------------------  RADIOLOGY:  Dg Abd 1 View  Result Date: 03/14/2017 CLINICAL DATA:  Abdominal distension. EXAM: ABDOMEN - 1 VIEW COMPARISON:  Radiographs of March 12, 2017. FINDINGS: The bowel gas pattern is normal. Large amount of stool is noted in the rectum. Distal tip of nasogastric tube is seen projected over distal stomach. No abnormal calcifications are noted. IMPRESSION: No evidence of bowel obstruction or ileus. Large amount of stool is noted in the rectum. Electronically Signed   By: Lupita Raider, M.D.   On: 03/14/2017 02:11   US Renal  Result Date: 03/12/2017 CLINICAL DATA:  Acute kidney failure EXAM: RENAL / URINARY TRACT ULTRASOUND COMPLETE COMPARISON:  None. FINDINGS: Right Kidney: Length: 9.9 cm. Echogenicity within normal limits. Small echogenic foci in the right kidney likely reflecting small nephrolithiasis with the largest measuring 9 mm. No mass or hydronephrosis visualized. Left Kidney: Length: 10.7 cm. Echogenicity within normal limits. Small echogenic focus in the left kidney likely reflecting small  nephrolithiasis measuring 7 mm. No mass or hydronephrosis visualized. Bladder: Appears normal for degree of bladder distention. IMPRESSION: 1. No obstructive uropathy. 2. Bilateral nonobstructing nephrolithiasis. Electronically Signed   By: Elige KoHetal  Patel   On: 03/12/2017 14:52   Dg Chest Port 1 View  Result Date: 03/13/2017 CLINICAL DATA:  Followup ventilator support EXAM: PORTABLE CHEST 1 VIEW COMPARISON:  03/12/2017 FINDINGS: Endotracheal tube tip is 4 cm above the carina. Nasogastric tube enters the abdomen. Persistent bilateral lower lobe pneumonia, not significantly changed. One could question slightly worsened volume loss in the left lower lobe. No visible effusion. Multiple granulomas in the spleen again seen. IMPRESSION: Tubes well positioned. Persistent bilateral lower lobe pneumonia, possibly with more volume loss in the left lower lobe. Electronically Signed   By: Paulina FusiMark  Shogry M.D.   On: 03/13/2017 07:36    EKG:   Orders placed or performed during the hospital encounter of 03/11/17  . ED EKG 12-Lead  . ED EKG 12-Lead  . EKG 12-Lead  . EKG 12-Lead    ASSESSMENT AND PLAN:    84 year old male with multiple medical problems including advanced dementia who is bedbound at the nursing home at baseline, CKD, congestive heart failure, diabetes mellitus, hypertension, spinal stenosis and neuropathy presents to hospital secondary to unresponsive episode.  1.  Acute hypoxic respiratory failure-abdominal respirations on arrival.  Intubated for hypoxia and airway protection. -Continues on the ventilator.  Appreciate intensivist consult -On 40% FiO2 at this time.  Chest x-ray with bibasilar pneumonia.  Concern for aspiration. -Blood cultures are negative.  Continue cefepime at this time. -Vancomycin has been discontinued  2.  Acute renal failure on CKD stage IV-baseline creatinine of 2.3, now much worse secondary to sepsis and prerenal azotemia -Family agree with no escalation of care and  definitely no hemodialysis. -Continue fluids for now -Monitor urine output.  Renal ultrasound, avoid nephrotoxins -Slightly improved creatinine  3.  Sepsis-secondary to healthcare acquired pneumonia.  On ventilator, antibiotics as described above -Elevated pro-calcitonin -On low-dose pressors  4. Hyponatremia-free water deficit.  On D5 1/2 normal saline  5.  Acute on chronic anemia-has anemia of chronic kidney disease-no active bleeding.  Hemoglobin dropped from baseline of 9 to 7.5.  Partly dilutional and also acute illness. -Monitor closely, transfuse if hemoglobin is less than 7.  6.  DVT prophylaxis-on subcu heparin.  Hold if hemoglobin drop is significant  7. Nutrition- since abdomen is distended, currently has an NG tube for suction. Likely has ileus. Not on any tube feeds yet  Overall poor prognosis.  Intensivist had a family meeting with patient's family and they have agreed for DNR for now, no further escalation of care at this time.  Continue vent support for the next 2-3 days and further decisions based on patient change in clinical status.  Tapering sedation today and going to give trial of spontaneous breathing today by ICU team.    All the records are reviewed and case discussed with Care Management/Social Workerr. Management plans discussed with the patient, family and they are in agreement.  CODE STATUS: DNR  TOTAL TIME TAKING CARE OF THIS PATIENT: 30 minutes.   POSSIBLE D/C IN 2-3 DAYS, DEPENDING ON CLINICAL  CONDITION.   Altamese Dilling M.D on 03/14/2017 at 1:54 PM  Between 7am to 6pm - Pager - [phone]  After 6pm go to [email or web address] - Social research officer, government  [employer]  [employer]  Office  [phone]  CC: Primary care physician; Marisue Ivan, MD

## 2017-03-15 ENCOUNTER — Inpatient Hospital Stay: Payer: Medicare HMO

## 2017-03-15 LAB — RENAL FUNCTION PANEL
ANION GAP: 10 (ref 5–15)
Albumin: 2.2 g/dL — ABNORMAL LOW (ref 3.5–5.0)
BUN: 55 mg/dL — ABNORMAL HIGH (ref 6–20)
CHLORIDE: 113 mmol/L — AB (ref 101–111)
CO2: 20 mmol/L — AB (ref 22–32)
CREATININE: 2.65 mg/dL — AB (ref 0.61–1.24)
Calcium: 8.5 mg/dL — ABNORMAL LOW (ref 8.9–10.3)
GFR, EST AFRICAN AMERICAN: 24 mL/min — AB (ref >60)
GFR, EST NON AFRICAN AMERICAN: 21 mL/min — AB (ref >60)
Glucose, Bld: 201 mg/dL — ABNORMAL HIGH (ref 65–99)
POTASSIUM: 3.8 mmol/L (ref 3.5–5.1)
Phosphorus: 3 mg/dL (ref 2.5–4.6)
Sodium: 143 mmol/L (ref 135–145)

## 2017-03-15 LAB — CBC
HCT: 22.4 % — ABNORMAL LOW (ref 40.0–52.0)
HEMOGLOBIN: 7.4 g/dL — AB (ref 13.0–18.0)
MCH: 29.8 pg (ref 26.0–34.0)
MCHC: 33.1 g/dL (ref 32.0–36.0)
MCV: 90 fL (ref 80.0–100.0)
PLATELETS: 190 10*3/uL (ref 150–440)
RBC: 2.49 MIL/uL — AB (ref 4.40–5.90)
RDW: 18 % — ABNORMAL HIGH (ref 11.5–14.5)
WBC: 6.5 10*3/uL (ref 3.8–10.6)

## 2017-03-15 LAB — GLUCOSE, CAPILLARY
GLUCOSE-CAPILLARY: 180 mg/dL — AB (ref 65–99)
GLUCOSE-CAPILLARY: 200 mg/dL — AB (ref 65–99)
Glucose-Capillary: 174 mg/dL — ABNORMAL HIGH (ref 65–99)
Glucose-Capillary: 177 mg/dL — ABNORMAL HIGH (ref 65–99)
Glucose-Capillary: 177 mg/dL — ABNORMAL HIGH (ref 65–99)
Glucose-Capillary: 179 mg/dL — ABNORMAL HIGH (ref 65–99)

## 2017-03-15 MED ORDER — FENTANYL CITRATE (PF) 100 MCG/2ML IJ SOLN
50.0000 ug | Freq: Once | INTRAMUSCULAR | Status: AC
  Administered 2017-03-15: 50 ug via INTRAVENOUS
  Filled 2017-03-15: qty 2

## 2017-03-15 MED ORDER — FENTANYL 2500MCG IN NS 250ML (10MCG/ML) PREMIX INFUSION: 25.0000 ug/h | INTRAVENOUS | Status: DC

## 2017-03-15 MED ORDER — FENTANYL BOLUS VIA INFUSION
25.0000 ug | INTRAVENOUS | Status: DC | PRN
  Filled 2017-03-15: qty 100

## 2017-03-15 NOTE — Progress Notes (Signed)
[facility]  ROUNDING NOTE   Subjective:   Creatinine 2.65 (2.99)  UOP 2605  D5W at 24m/hr  Na 143  Tmax 100.2  Objective:  Vital signs in last 24 hours:  Temp:  [99.3 F (37.4 C)-100.2 F (37.9 C)] 99.7 F (37.6 C) (02/03 0801) Pulse Rate:  [84-114] 93 (02/03 0700) Resp:  [12-26] 13 (02/03 0801) BP: (112-158)/(58-109) 144/60 (02/03 0801) SpO2:  [92 %-99 %] 98 % (02/03 0801) FiO2 (%):  [30 %] 30 % (02/03 0400)  Weight change:  Filed Weights   03/11/17 1225 03/11/17 1600  Weight: 79.8 kg (176 lb) 84.1 kg (185 lb 6.5 oz)    Intake/Output: I/O last 3 completed shifts: In: 3314.3 [I.V.:2529.4; NG/GT:634.9; IV Piggyback:150] Out: 3705 [[HRCBU:3845 Emesis/NG output:330]   Intake/Output this shift:  No intake/output data recorded.  Physical Exam: General: Critically ill  Head: ETT, OGT  Eyes: Anicteric, PERRL  Neck: Supple, trachea midline  Lungs:  PRBC FiO2 30%  Heart: Regular rate and rhythm  Abdomen:  +distended  Extremities:  + peripheral edema.  Neurologic: Intubated, sedated  Skin: No lesions  GU: Foley with yellow urine    Basic Metabolic Panel: Recent Labs  Lab 03/12/17 0756 03/13/17 0501 03/13/17 2356 03/14/17 0348 03/15/17 0651  NA 152* 152* 145 146* 143  K 4.4 4.1 4.1 3.9 3.8  CL 120* 117* 117* 117* 113*  CO2 23 21* 20* 19* 20*  GLUCOSE 97 199* 283* 230* 201*  BUN 82* 72* 70* 65* 55*  CREATININE 3.92* 3.21* 3.11* 2.99* 2.65*  CALCIUM 8.2* 8.5* 8.0* 8.0* 8.5*  MG  --  3.0* 2.9*  --   --   PHOS  --  3.2 2.8  --  3.0    Liver Function Tests: Recent Labs  Lab 03/11/17 1201 03/15/17 0651  AST 36  --   ALT 27  --   ALKPHOS 80  --   BILITOT 0.7  --   PROT 8.5*  --   ALBUMIN 3.0* 2.2*   No results for input(s): LIPASE, AMYLASE in the last 168 hours. No results for input(s): AMMONIA in the last 168 hours.  CBC: Recent Labs  Lab 03/11/17 1201 03/12/17 0756 03/13/17 0501 03/14/17 0348 03/15/17 0651  WBC 8.9  12.0* 10.4 8.7 6.5  NEUTROABS 7.1*  --   --   --   --   HGB 8.9* 7.5* 7.6* 7.2* 7.4*  HCT 26.9* 23.5* 23.4* 21.5* 22.4*  MCV 91.4 91.8 92.1 91.5 90.0  PLT 285 194 183 179 190    Cardiac Enzymes: Recent Labs  Lab 03/11/17 1201  TROPONINI 0.03*    BNP: Invalid input(s): POCBNP  CBG: Recent Labs  Lab 03/14/17 1551 03/14/17 1951 03/14/17 2358 03/15/17 0400 03/15/17 0727  GLUCAP 195* 147* 189* 177* 177*    Microbiology: Results for orders placed or performed during the hospital encounter of 03/11/17  Blood Culture (routine x 2)     Status: None (Preliminary result)   Collection Time: 03/11/17 12:01 PM  Result Value Ref Range Status   Specimen Description BLOOD LEFT WRIST  Final   Special Requests   Final    BOTTLES DRAWN AEROBIC AND ANAEROBIC Blood Culture adequate volume   Culture   Final    NO GROWTH 4 DAYS Performed at [facility] [address] [place] [address]   Report Status PENDING  Incomplete  Urine culture     Status: None   Collection Time: 03/11/17 12:36 PM  Result Value Ref Range Status   Specimen Description   Final    URINE, RANDOM Performed at [facility], [address], [place], [place] [address]    Special Requests   Final    NONE Performed at [facility], [address], [place], [place] [address]    Culture   Final    NO GROWTH Performed at [facility], [place] [address] [place], [place] [address]    Report Status 03/13/2017 FINAL  Final  Culture, respiratory (NON-Expectorated)     Status: None   Collection Time: 03/11/17  3:51 PM  Result Value Ref Range Status   Specimen Description   Final    TRACHEAL ASPIRATE Performed at [facility], [address] [place], [place] [address]    Special Requests   Final    NONE Performed at [facility], [address] [place], [place] [address]    Gram Stain   Final    FEW WBC PRESENT, PREDOMINANTLY PMN FEW  YEAST Performed at [facility], [place] [address] [place], [place] [address]    Culture MODERATE CANDIDA ALBICANS  Final   Report Status 03/14/2017 FINAL  Final  MRSA PCR Screening     Status: None   Collection Time: 03/11/17  4:41 PM  Result Value Ref Range Status   MRSA by PCR NEGATIVE NEGATIVE Final    Comment:        The GeneXpert MRSA Assay (FDA approved for NASAL specimens only), is one component of a comprehensive MRSA colonization surveillance program. It is not intended to diagnose MRSA infection nor to guide or monitor treatment for MRSA infections. Performed at [facility], [address] [place],  [address]     Coagulation Studies: No results for input(s): LABPROT, INR in the last 72 hours.  Urinalysis: No results for input(s): COLORURINE, LABSPEC, PHURINE, GLUCOSEU, HGBUR, BILIRUBINUR, KETONESUR, PROTEINUR, UROBILINOGEN, NITRITE, LEUKOCYTESUR in the last 72 hours.  Invalid input(s): APPERANCEUR    Imaging: Dg Abd 1 View  Result Date: 03/14/2017 CLINICAL DATA:  Abdominal distension. EXAM: ABDOMEN - 1 VIEW COMPARISON:  Radiographs of March 12, 2017. FINDINGS: The bowel gas pattern is normal. Large amount of stool is noted in the rectum. Distal tip of nasogastric tube is seen projected over distal stomach. No abnormal calcifications are noted. IMPRESSION: No evidence of bowel obstruction or ileus. Large amount of stool is noted in the rectum. Electronically Signed   By: Marijo Conception, M.D.   On: 03/14/2017 02:11   Dg Chest Port 1 View  Result Date: 03/15/2017 CLINICAL DATA:  Respiratory failure. EXAM: PORTABLE CHEST 1 VIEW COMPARISON:  03/13/2017 FINDINGS: Endotracheal tube terminates 2.5 cm above the carina. Cardiomediastinal silhouette is normal. Mediastinal contours appear intact. There is no evidence of pneumothorax. Slight worsening and patchy right lung alveolar and interstitial pulmonary opacities. Milder peribronchial airspace  consolidation in the left lower lobe. Osseous structures are without acute abnormality. Soft tissues are grossly normal. IMPRESSION: Slight worsening in the patchy right alveolar and interstitial pulmonary infiltrates. More subtle peribronchial airspace disease in the left lower lobe. Electronically Signed   By: Fidela Salisbury M.D.   On: 03/15/2017 08:01     Medications:   . ceFEPime (MAXIPIME) IV Stopped (03/14/17 1057)  . dextrose 50 mL/hr at 03/15/17 0536  . famotidine (PEPCID) IV Stopped (03/14/17 1906)  . feeding supplement (VITAL AF 1.2 CAL) 1,000 mL (03/15/17 0500)  . fentaNYL infusion INTRAVENOUS    .  norepinephrine (LEVOPHED) Adult infusion Stopped (03/12/17 0701)  . sodium chloride     . chlorhexidine gluconate (MEDLINE KIT)  15 mL Mouth Rinse BID  . free water  200 mL Per Tube Q8H  . heparin  5,000 Units Subcutaneous Q8H  . insulin aspart  0-15 Units Subcutaneous Q4H  . mouth rinse  15 mL Mouth Rinse 10 times per day  . senna-docusate  1 tablet Per Tube BID  . sodium phosphate  1 enema Rectal Once   acetaminophen **OR** acetaminophen, albuterol, bisacodyl, fentaNYL, [DISCONTINUED] ondansetron **OR** ondansetron (ZOFRAN) IV  Assessment/ Plan:  [name] is a 84 y.o. black male with diabetes mellitus type II, hypertension, dementia, diabetic neuropathy, coronary artery disease, CVA  1. Acute Renal Failure on chronic kidney disease stage IV with proteinuria: baseline creatinine of 2.39, GFR of 27 11/14/2016.  Acute renal failure secondary to sepsis and prerenal azotemia Renal function improving. Nonoliguric urine output.  - Wife states that patient is not interested in dialysis.  - Discontinue IV fluids as patient has peripheral edema  2. Sepsis: with pneumonia and acute respiratory failure requiring mechanical ventilation. Subclinical fever in last 24 hours. Wbc trended down  - cefepime and vanco empirically.   3. Anemia with renal failure: hemoglobin  7.4  4. Hypernatremia: free water deficit with IV D5W   LOS: 4 Catherene [place] 2/3/20199:02 AM

## 2017-03-15 NOTE — Plan of Care (Signed)
Patient had PRN pain med x 1 this shift.  No other concerns.  Alert and able to follow commands.  Medium bowel movement.  Abdomen remains distended.  No acute issues.  Will continue to monitor.

## 2017-03-15 NOTE — Progress Notes (Signed)
PULMONARY / CRITICAL CARE MEDICINE   Name: William Byrd MRN: [id number] DOB: 24-May-1931    ADMISSION DATE:  03/11/2017    HISTORY OF PRESENT ILLNESS:   23 M with very advanced dementia, SNF resident, recently initiated on antibiotics for pneumonia, witnessed to have aspirated while being fed at SNF.  Transferred to [facility] ED where he was found to be minimally responsive with agonal respirations.  Intubated after discussions regarding goals of care with patient's wife.   SUBJECTIVE:  No acute events in the night, still on vent  VITAL SIGNS: BP 138/69   Pulse 90   Temp 99.9 F (37.7 C)   Resp 12   Ht 5\' 11"  (1.803 m)   Wt 185 lb 6.5 oz (84.1 kg)   SpO2 97%   BMI 25.86 kg/m   HEMODYNAMICS:  no compromise  VENTILATOR SETTINGS: Vent Mode: PRVC FiO2 (%):  [30 %] 30 % Set Rate:  [16 bmp-24 bmp] 16 bmp Vt Set:  [500 mL] 500 mL PEEP:  [5 cmH20] 5 cmH20 Plateau Pressure:  [14 cmH20-22 cmH20] 22 cmH20  INTAKE / OUTPUT: I/O last 3 completed shifts: In: 3314.3 [I.V.:2529.4; NG/GT:634.9; IV Piggyback:150] Out: 3705 [Urine:3375; Emesis/NG output:330]  PHYSICAL EXAMINATION: General:  Remains critically ill on mechanical ventilation Neuro:  Sedated on Propofol HEENT: PERRL Cardiovascular:  RRR Lungs:  Good A/E at apex. Decrease at base with rhonchi, mild to mod secretions this AM Abdomen:  Distended, inaudible BS Musculoskeletal:  Trace oedema Skin:  Warm and dry  LABS:  BMET Recent Labs  Lab 03/13/17 2356 03/14/17 0348 03/15/17 0651  NA 145 146* 143  K 4.1 3.9 3.8  CL 117* 117* 113*  CO2 20* 19* 20*  BUN 70* 65* 55*  CREATININE 3.11* 2.99* 2.65*  GLUCOSE 283* 230* 201*    Electrolytes Recent Labs  Lab 03/13/17 0501 03/13/17 2356 03/14/17 0348 03/15/17 0651  CALCIUM 8.5* 8.0* 8.0* 8.5*  MG 3.0* 2.9*  --   --   PHOS 3.2 2.8  --  3.0    CBC Recent Labs  Lab 03/13/17 0501 03/14/17 0348 03/15/17 0651  WBC 10.4 8.7 6.5  HGB 7.6* 7.2* 7.4*  HCT  23.4* 21.5* 22.4*  PLT 183 179 190    Coag's Recent Labs  Lab 03/11/17 1531  APTT 39*  INR 2.33    Sepsis Markers Recent Labs  Lab 03/11/17 1201 03/11/17 1531  LATICACIDVEN 1.9 2.2*  PROCALCITON  --  41.70    ABG Recent Labs  Lab 03/11/17 1620  PHART 7.37  PCO2ART 41  PO2ART PENDING    Liver Enzymes Recent Labs  Lab 03/11/17 1201 03/15/17 0651  AST 36  --   ALT 27  --   ALKPHOS 80  --   BILITOT 0.7  --   ALBUMIN 3.0* 2.2*    Cardiac Enzymes Recent Labs  Lab 03/11/17 1201  TROPONINI 0.03*    Glucose Recent Labs  Lab 03/14/17 1551 03/14/17 1951 03/14/17 2358 03/15/17 0400 03/15/17 0727 03/15/17 1305  GLUCAP 195* 147* 189* 177* 177* 180*    Imaging Dg Chest Port 1 View  Result Date: 03/15/2017 CLINICAL DATA:  Respiratory failure. EXAM: PORTABLE CHEST 1 VIEW COMPARISON:  03/13/2017 FINDINGS: Endotracheal tube terminates 2.5 cm above the carina. Cardiomediastinal silhouette is normal. Mediastinal contours appear intact. There is no evidence of pneumothorax. Slight worsening and patchy right lung alveolar and interstitial pulmonary opacities. Milder peribronchial airspace consolidation in the left lower lobe. Osseous structures are without acute abnormality. Soft  tissues are grossly normal. IMPRESSION: Slight worsening in the patchy right alveolar and interstitial pulmonary infiltrates. More subtle peribronchial airspace disease in the left lower lobe. Electronically Signed   By: Ted Mcalpineobrinka  Dimitrova M.D.   On: 03/15/2017 08:01     ASSESSMENT / PLAN:  PULMONARY A: Acute Respiratory Failure on vent support secondary to Aspiration Pneumonia, Tolerating SBT but is very weak with secretions P:   Continue present vent settings. FIO2 now at 30% Continue lung protective strategy ventilation Continue on lung recruitment protocol Continue SBT  CARDIOVASCULAR A: No active issues P:  Monitor rhythm and BP MAP goal >65 mmHg  DNR in event of cardiac  arrest  RENAL A:   CKD AKI, nonoliguric -creatinine continues to improve Hypernatremia- has resolved Hyperchloremia improving P:   IV free water D/C; continue with NGT free water, hopefully secretions will decrease with this intervention Monitor I/Os Correct electrolytes as indicated Nephrology following.   GASTROINTESTINAL A:   Abdominal distention- much improved Constipation/obstipation- resolved P:   SUP: IV famotidine Dulcolax suppository ordered 1/31 Continue on bowel regimen  HEMATOLOGIC A:   Critical Illness anemia P:  Transfuse PRBC for hemoglobin < 7  INFECTIOUS A:   Aspiration Pneumonia on vent support P:   Leukocytosis has reolved Continue ABX - Cefepime to course  ENDOCRINE A:   Type 2 DM with hyperglycemia since starting TF P:   Continue Target Glucose monitoring and coverage  NEUROLOGIC A:   Acute Metabolic Encephalopathy- improving off sedatives Hx of Advance Dementia P:   RASS goal: 1-2 Will try to avoid sedatives if possible    FAMILY  - Wife and daughter updated   Disp: Patient is a DNR without escalation of care.   I have dedicated a total of 35 minutes in critical care time minus all appropriate exclusions.    Jackson LatinoKarol Jaliana [place]ell[place], MD Pulmonary and Critical Care Medicine [facility] Pager: [phone]  03/15/2017, 2:57 PM

## 2017-03-15 NOTE — Progress Notes (Signed)
Placed pt back on prvc  After ps weaning during the day sat 100% hr 89 RR25

## 2017-03-15 NOTE — Progress Notes (Signed)
[employer] [facility] [place] [facility]   PATIENT NAME: William Byrd    MR#:  [id number]  DATE OF BIRTH:  [birth year]  SUBJECTIVE:  CHIEF COMPLAINT:   Chief Complaint  Patient presents with  . Pneumonia   -Intubated and sedated. Admitted for possible aspiration pneumonia. Critically ill appearing.   Tapering sedation for now.  REVIEW OF SYSTEMS:  Review of Systems  Unable to perform ROS: Critical illness   Patient is alert but still intubated.  DRUG ALLERGIES:   Allergies  Allergen Reactions  . Fosinopril Other (See Comments)    Other reaction(s): Other (See Comments) Hyperkalemia Reaction: Hyperkalemia     VITALS:  Blood pressure 138/69, pulse 90, temperature 99.9 F (37.7 C), resp. rate 12, height 5\' 11"  (1.803 m), weight 84.1 kg (185 lb 6.5 oz), SpO2 97 %.  PHYSICAL EXAMINATION:  Physical Exam  GENERAL:  84 y.o.-year-old critically ill appearing patient lying in the bed, Intubated and sedated EYES: Pupils equal, round, reactive to light and accommodation. No scleral icterus.Marland Kitchen  HEENT: Head atraumatic, normocephalic. Oropharynx and nasopharynx clear.  NECK:  Supple, no jugular venous distention. No thyroid enlargement, no tenderness. OT tube present LUNGS: scant breath sounds bilaterally, no wheezing, rales  or crepitation. No use of accessory muscles of respiration. Decreased bibasilar breath sounds CARDIOVASCULAR: S1, S2 normal. No rubs, or gallops. 3/6 systolic murmur present ABDOMEN: Firm and distended. Nontender. Bowel sounds present. No organomegaly or mass.  EXTREMITIES: No pedal edema, cyanosis, or clubbing.  NEUROLOGIC: Tapering off sedation, patient opens eyes to calling his name .  PSYCHIATRIC: The patient is intubated and sedated SKIN: No obvious rash, lesion, or ulcer.    LABORATORY PANEL:   CBC Recent Labs  Lab 03/15/17 0651  WBC 6.5  HGB 7.4*  HCT 22.4*  PLT 190    ------------------------------------------------------------------------------------------------------------------  Chemistries  Recent Labs  Lab 03/11/17 1201  03/13/17 2356  03/15/17 0651  NA 148*   < > 145   < > 143  K 5.3*   < > 4.1   < > 3.8  CL 111   < > 117*   < > 113*  CO2 23   < > 20*   < > 20*  GLUCOSE 127*   < > 283*   < > 201*  BUN 92*   < > 70*   < > 55*  CREATININE 4.97*   < > 3.11*   < > 2.65*  CALCIUM 9.1   < > 8.0*   < > 8.5*  MG  --    < > 2.9*  --   --   AST 36  --   --   --   --   ALT 27  --   --   --   --   ALKPHOS 80  --   --   --   --   BILITOT 0.7  --   --   --   --    < > = values in this interval not displayed.   ------------------------------------------------------------------------------------------------------------------  Cardiac Enzymes Recent Labs  Lab 03/11/17 1201  TROPONINI 0.03*   ------------------------------------------------------------------------------------------------------------------  RADIOLOGY:  Dg Abd 1 View  Result Date: 03/14/2017 CLINICAL DATA:  Abdominal distension. EXAM: ABDOMEN - 1 VIEW COMPARISON:  Radiographs of March 12, 2017. FINDINGS: The bowel gas pattern is normal. Large amount of stool is noted in the rectum. Distal tip of nasogastric tube is seen projected over distal stomach. No abnormal calcifications are noted.  IMPRESSION: No evidence of bowel obstruction or ileus. Large amount of stool is noted in the rectum. Electronically Signed   By: Lupita RaiderJames  Green Jr, M.D.   On: 03/14/2017 02:11   Dg Chest Port 1 View  Result Date: 03/15/2017 CLINICAL DATA:  Respiratory failure. EXAM: PORTABLE CHEST 1 VIEW COMPARISON:  03/13/2017 FINDINGS: Endotracheal tube terminates 2.5 cm above the carina. Cardiomediastinal silhouette is normal. Mediastinal contours appear intact. There is no evidence of pneumothorax. Slight worsening and patchy right lung alveolar and interstitial pulmonary opacities. Milder peribronchial airspace  consolidation in the left lower lobe. Osseous structures are without acute abnormality. Soft tissues are grossly normal. IMPRESSION: Slight worsening in the patchy right alveolar and interstitial pulmonary infiltrates. More subtle peribronchial airspace disease in the left lower lobe. Electronically Signed   By: Ted Mcalpineobrinka  Dimitrova M.D.   On: 03/15/2017 08:01    EKG:   Orders placed or performed during the hospital encounter of 03/11/17  . ED EKG 12-Lead  . ED EKG 12-Lead  . EKG 12-Lead  . EKG 12-Lead    ASSESSMENT AND PLAN:    84 year old male with multiple medical problems including advanced dementia who is bedbound at the nursing home at baseline, CKD, congestive heart failure, diabetes mellitus, hypertension, spinal stenosis and neuropathy presents to hospital secondary to unresponsive episode.  1.  Acute hypoxic respiratory failure-abdominal respirations on arrival.  Intubated for hypoxia and airway protection. -Continues on the ventilator.  Appreciate intensivist consult -On 40% FiO2 at this time.  Chest x-ray with bibasilar pneumonia.  Concern for aspiration. -Blood cultures are negative.  Continue cefepime at this time. -Vancomycin has been discontinued  2.  Acute renal failure on CKD stage IV-baseline creatinine of 2.3, now much worse secondary to sepsis and prerenal azotemia -Family agree with no escalation of care and definitely no hemodialysis. -stopped fluids now. -Monitor urine output.  Renal ultrasound, avoid nephrotoxins -Slightly improved creatinine  3.  Sepsis-secondary to healthcare acquired pneumonia.  On ventilator, antibiotics as described above -Elevated pro-calcitonin -On low-dose pressors  4. Hyponatremia-free water deficit.  On D5 1/2 normal saline  5.  Acute on chronic anemia-has anemia of chronic kidney disease-no active bleeding.  Hemoglobin dropped from baseline of 9 to 7.5.  Partly dilutional and also acute illness. -Monitor closely, transfuse if  hemoglobin is less than 7.  6.  DVT prophylaxis-on subcu heparin.  Hold if hemoglobin drop is significant  7. Nutrition- since abdomen is distended, currently has an NG tube for suction. Likely has ileus. Not on any tube feeds yet  Overall poor prognosis.  Intensivist had a family meeting with patient's family and they have agreed for DNR for now, no further escalation of care at this time.  Continue vent support for the next 1-2 days and further decisions based on patient change in clinical status.  Tapering sedation today and going to give trial of spontaneous breathing today by ICU team.   All the records are reviewed and case discussed with Care Management/Social Workerr. Management plans discussed with the patient, family and they are in agreement.  CODE STATUS: DNR  TOTAL TIME TAKING CARE OF THIS PATIENT: 30 minutes.   POSSIBLE D/C IN 2-3 DAYS, DEPENDING ON CLINICAL CONDITION.   Altamese DillingVaibhavkumar Tomer Chalmers M.D on 03/15/2017 at 2:23 PM  Between 7am to 6pm - Pager - [phone]  After 6pm go to [email or web address] - Social research officer, governmentpassword EPAS [facility]  [facility]  Office  [phone]  CC: Primary care physician; [name], [name], MD

## 2017-03-15 NOTE — Progress Notes (Signed)
MEDICATION RELATED CONSULT NOTE  Pharmacy Consult for electrolyte/glucose/constipation monitoring   Assessment: 84 yo M who presented to the hospital secondary to unresponsive episode at nursing home. Patient is in acute renal failure with CKD stage IV and prerenal azotemia, family is refusing hemodialysis.   Plan:  1. Electrolytes WNL. Will recheck with am labs.  2. Continue Novolog 0-15 units sliding scale q4h  3. Bowel movement documented today 2/3.  Continue senna/docusate 1 tab BID and  Dulcolax 10 mg suppository daily as needed for constipation. Will continue to assess appropriateness of bowel regimen.  Pharmacy will continue to monitor and adjust per consult.    Allergies  Allergen Reactions  . Fosinopril Other (See Comments)    Other reaction(s): Other (See Comments) Hyperkalemia Reaction: Hyperkalemia     Patient Measurements: Height: 5\' 11"  (180.3 cm) Weight: 185 lb 6.5 oz (84.1 kg) IBW/kg (Calculated) : 75.3  Vital Signs: Temp: 99.7 F (37.6 C) (02/03 0801) Temp Source: Bladder (02/03 0700) BP: 144/60 (02/03 0801) Pulse Rate: 93 (02/03 0700) Intake/Output from previous day: 02/02 0701 - 02/03 0700 In: 1767 [I.V.:1168.8; NG/GT:448.2; IV Piggyback:150] Out: 2755 [Urine:2605; Emesis/NG output:150] Intake/Output from this shift: No intake/output data recorded.  Labs: Recent Labs    03/13/17 0501 03/13/17 2356 03/14/17 0348 03/15/17 0651  WBC 10.4  --  8.7 6.5  HGB 7.6*  --  7.2* 7.4*  HCT 23.4*  --  21.5* 22.4*  PLT 183  --  179 190  CREATININE 3.21* 3.11* 2.99* 2.65*  MG 3.0* 2.9*  --   --   PHOS 3.2 2.8  --  3.0  ALBUMIN  --   --   --  2.2*   Estimated Creatinine Clearance: 22.5 mL/min (A) (by C-G formula based on SCr of 2.65 mg/dL (H)).  Medical History: Past Medical History:  Diagnosis Date  . Arthritis   . Background retinopathy   . Cervicalgia   . Chronic kidney disease    follow up with a Dr.   . Chronic systolic CHF (congestive  heart failure) (HCC)   . Congenital heart failure (HCC)   . Degeneration of lumbar or lumbosacral intervertebral disc   . Dementia   . Diabetes mellitus type 2, uncomplicated (HCC) 1996   on insulin since 2011, last eye exam with Dr. Inez Pilgrim 07/2013  . Diabetes mellitus without complication (HCC)   . DVT (deep venous thrombosis) (HCC)   . Essential hypertension    unspecified  . Generalized weakness   . Glaucoma   . Hypercholesteremia   . Hypertension   . Long-term insulin use (HCC)   . Moderate dementia   . Peripheral polyneuropathy   . Peripheral vascular disease (HCC)   . Pneumonia, unspecified organism 04/01/2014  . Pure hypercholesterolemia   . Spinal stenosis   . Spinal stenosis, lumbar region without neurogenic claudication     Medications:  Medications Prior to Admission  Medication Sig Dispense Refill Last Dose  . acetaminophen (TYLENOL) 325 MG tablet Take 650 mg by mouth every 6 (six) hours as needed for mild pain, moderate pain, fever or headache.   PRN at PRN  . albuterol (PROVENTIL HFA;VENTOLIN HFA) 108 (90 Base) MCG/ACT inhaler Inhale 2 puffs into the lungs every 4 (four) hours as needed for wheezing or shortness of breath.   PRN at PRN  . amLODipine (NORVASC) 10 MG tablet Take 1 tablet (10 mg total) by mouth daily. 30 tablet 0 03/10/2017 at 0900  . benzonatate (TESSALON) 100 MG capsule Take 200  mg by mouth every 8 (eight) hours as needed for cough.   PRN at PRN  . bisacodyl (DULCOLAX) 5 MG EC tablet Take 1 tablet (5 mg total) by mouth daily as needed for moderate constipation. 30 tablet 0 PRN at PRN  . cefTRIAXone (ROCEPHIN) IVPB Inject 2 g into the vein once.   03/10/2017 at 1330  . cholecalciferol (VITAMIN D) 1000 UNITS tablet Take 1,000 Units by mouth daily.   03/10/2017 at 0900  . docusate sodium (STOOL SOFTENER) 100 MG capsule Take 100 mg by mouth daily as needed for mild constipation or moderate constipation.    PRN at PRN  . donepezil (ARICEPT) 10 MG tablet  Take 10 mg by mouth at bedtime.    03/09/2017 at 2300  . DULoxetine (CYMBALTA) 20 MG capsule Take 20 mg by mouth daily.   03/10/2017 at 0900  . finasteride (PROSCAR) 5 MG tablet Take 5 mg by mouth daily. *staff : do not handle without gloves*   03/10/2017 at 0900  . furosemide (LASIX) 20 MG tablet Take 2 tablets (40 mg) by mouth daily each morning. Take an additional 1 tablet (20 mg) by mouth  6 hours later.   03/10/2017 at 1730  . glucagon (GLUCAGON EMERGENCY) 1 MG injection Inject 1 mg into the vein once as needed.   PRN at PRN  . GLUCERNA (GLUCERNA) LIQD Take 1 Can by mouth daily.    UTD at UTD  . HYDROcodone-acetaminophen (NORCO/VICODIN) 5-325 MG tablet Take 1 tablet by mouth every 6 (six) hours as needed for moderate pain. 20 tablet 0 PRN at PRN  . Insulin Glargine (TOUJEO SOLOSTAR) 300 UNIT/ML SOPN Inject 60 Units into the skin every morning. 8 am    03/10/2017 at 0900  . insulin regular (NOVOLIN R,HUMULIN R) 100 units/mL injection Inject 3-12 Units into the skin 4 (four) times daily -  before meals and at bedtime. If BS < 60 call NP/PA, If BS is 175 to 250 give 3 units, 251 to 325 give 6 units, 326 to 450, give 10 units, if BS > 450 give 12 units and call NP/PA, check bs ac&hs, if < 175 document 0 units    03/10/2017 at 2015  . ipratropium-albuterol (DUONEB) 0.5-2.5 (3) MG/3ML SOLN Take 3 mLs by nebulization as needed.    PRN at PRN  . memantine (NAMENDA XR) 28 MG CP24 24 hr capsule Take 28 mg by mouth daily.    03/10/2017 at 0900  . metoprolol tartrate (LOPRESSOR) 25 MG tablet Take 1 tablet (25 mg total) by mouth 2 (two) times daily.   03/10/2017 at 0900  . OXYGEN Inhale 2 L into the lungs as needed. Check o2 sat prior to placing on patient and at least every 4 hours after applying   UTD at UTD  . pantoprazole (PROTONIX) 20 MG tablet Take 20 mg by mouth daily.   03/10/2017 at 0530  . pravastatin (PRAVACHOL) 10 MG tablet Take 10 mg by mouth daily.   03/08/2017 at 1700  . pregabalin (LYRICA) 75 MG capsule  Take 1 capsule (75 mg total) by mouth 2 (two) times daily. 60 capsule 4 03/10/2017 at 0900  . Probiotic Product (RISA-BID PROBIOTIC) TABS Take 1 tablet by mouth 2 (two) times daily.   UTD at UTD  . rivaroxaban (XARELTO) 20 MG TABS tablet Take 1 tablet by mouth daily.    03/10/2017 at 1730  . sacubitril-valsartan (ENTRESTO) 49-51 MG Take 1 tablet by mouth 2 (two) times daily.   03/10/2017  at 0900  . senna (SENOKOT) 8.6 MG TABS tablet Take 1 tablet (8.6 mg total) by mouth daily. 100 each 0 03/10/2017 at 0900  . tamsulosin (FLOMAX) 0.4 MG CAPS capsule Take 0.4 mg by mouth at bedtime.   03/09/2017 at 2300  . timolol (TIMOPTIC) 0.5 % ophthalmic solution Place 1 drop into both eyes 2 (two) times daily.    03/10/2017 at 2015  . Wound Dressings (TRIAD HYDROPHILIC WOUND DRESSI) PSTE Apply cream topically to macerated areas of buttocks daily and prn   UTD at UTD    Carmelina Balducci K, [facility]CRPH 03/15/2017,8:22 AM

## 2017-03-16 ENCOUNTER — Inpatient Hospital Stay: Payer: Medicare HMO

## 2017-03-16 ENCOUNTER — Other Ambulatory Visit: Payer: Self-pay

## 2017-03-16 DIAGNOSIS — J9621 Acute and chronic respiratory failure with hypoxia: Secondary | ICD-10-CM

## 2017-03-16 LAB — RENAL FUNCTION PANEL
ANION GAP: 8 (ref 5–15)
Albumin: 2.2 g/dL — ABNORMAL LOW (ref 3.5–5.0)
BUN: 51 mg/dL — ABNORMAL HIGH (ref 6–20)
CO2: 21 mmol/L — ABNORMAL LOW (ref 22–32)
Calcium: 8.7 mg/dL — ABNORMAL LOW (ref 8.9–10.3)
Chloride: 118 mmol/L — ABNORMAL HIGH (ref 101–111)
Creatinine, Ser: 2.23 mg/dL — ABNORMAL HIGH (ref 0.61–1.24)
GFR calc Af Amer: 30 mL/min — ABNORMAL LOW (ref >60)
GFR calc non Af Amer: 26 mL/min — ABNORMAL LOW (ref >60)
GLUCOSE: 195 mg/dL — AB (ref 65–99)
POTASSIUM: 3.8 mmol/L (ref 3.5–5.1)
Phosphorus: 2.8 mg/dL (ref 2.5–4.6)
Sodium: 147 mmol/L — ABNORMAL HIGH (ref 135–145)

## 2017-03-16 LAB — CBC
HEMATOCRIT: 21.3 % — AB (ref 40.0–52.0)
HEMOGLOBIN: 7.2 g/dL — AB (ref 13.0–18.0)
MCH: 30.3 pg (ref 26.0–34.0)
MCHC: 33.6 g/dL (ref 32.0–36.0)
MCV: 90.2 fL (ref 80.0–100.0)
Platelets: 193 10*3/uL (ref 150–440)
RBC: 2.37 MIL/uL — ABNORMAL LOW (ref 4.40–5.90)
RDW: 18.2 % — ABNORMAL HIGH (ref 11.5–14.5)
WBC: 7.3 10*3/uL (ref 3.8–10.6)

## 2017-03-16 LAB — CULTURE, BLOOD (ROUTINE X 2)
CULTURE: NO GROWTH
Special Requests: ADEQUATE

## 2017-03-16 LAB — GLUCOSE, CAPILLARY
GLUCOSE-CAPILLARY: 178 mg/dL — AB (ref 65–99)
GLUCOSE-CAPILLARY: 156 mg/dL — AB (ref 65–99)
Glucose-Capillary: 181 mg/dL — ABNORMAL HIGH (ref 65–99)
Glucose-Capillary: 190 mg/dL — ABNORMAL HIGH (ref 65–99)
Glucose-Capillary: 88 mg/dL (ref 65–99)
Glucose-Capillary: 134 mg/dL — ABNORMAL HIGH (ref 65–99)

## 2017-03-16 LAB — TRIGLYCERIDES: Triglycerides: 323 mg/dL — ABNORMAL HIGH (ref <150)

## 2017-03-16 MED ORDER — FENTANYL CITRATE (PF) 100 MCG/2ML IJ SOLN
50.0000 ug | INTRAMUSCULAR | Status: DC | PRN
  Administered 2017-03-16: 50 ug via INTRAVENOUS
  Filled 2017-03-16: qty 2

## 2017-03-16 MED ORDER — FENTANYL CITRATE (PF) 100 MCG/2ML IJ SOLN: 25.0000 ug | INTRAMUSCULAR | Status: DC | PRN

## 2017-03-16 MED ORDER — MORPHINE SULFATE (PF) 2 MG/ML IV SOLN: 2.0000 mg | INTRAVENOUS | Status: DC | PRN

## 2017-03-16 MED ORDER — LORAZEPAM 2 MG/ML IJ SOLN: 1.0000 mg | INTRAMUSCULAR | Status: DC | PRN

## 2017-03-16 NOTE — Progress Notes (Signed)
MEDICATION RELATED CONSULT NOTE  Pharmacy Consult for electrolyte/glucose/constipation monitoring   Assessment: 84 yo M who presented to the hospital secondary to unresponsive episode at nursing home.  Plan:  1. Electrolytes WNL. Will recheck electrolytes in 48 hours unless otherwise ordered by MD.   2. Continue Novolog 0-15 units sliding scale q4h  3. Bowel movement documented 2/3.  Continue senna/docusate 1 tab BID and  Dulcolax 10 mg suppository daily as needed for constipation. Will continue to assess appropriateness of bowel regimen.  Pharmacy will continue to monitor and adjust per consult.    Allergies  Allergen Reactions  . Fosinopril Other (See Comments)    Other reaction(s): Other (See Comments) Hyperkalemia Reaction: Hyperkalemia     Patient Measurements: Height: 5\' 11"  (180.3 cm) Weight: 185 lb 6.5 oz (84.1 kg) IBW/kg (Calculated) : 75.3  Vital Signs: Temp: 99.3 F (37.4 C) (02/04 1400) Temp Source: Core (02/04 1200) BP: 146/72 (02/04 1400) Pulse Rate: 90 (02/04 1400) Intake/Output from previous day: 02/03 0701 - 02/04 0700 In: 643.3 [NG/GT:593.3; IV Piggyback:50] Out: 1650 [Urine:1650] Intake/Output from this shift: Total I/O In: 270 [NG/GT:270] Out: 300 [Urine:300]  Labs: Recent Labs    03/13/17 2356 03/14/17 0348 03/15/17 0651 03/16/17 0407  WBC  --  8.7 6.5 7.3  HGB  --  7.2* 7.4* 7.2*  HCT  --  21.5* 22.4* 21.3*  PLT  --  179 190 193  CREATININE 3.11* 2.99* 2.65* 2.23*  MG 2.9*  --   --   --   PHOS 2.8  --  3.0 2.8  ALBUMIN  --   --  2.2* 2.2*   Estimated Creatinine Clearance: 26.7 mL/min (A) (by C-G formula based on SCr of 2.23 mg/dL (H)).  Medical History: Past Medical History:  Diagnosis Date  . Arthritis   . Background retinopathy   . Cervicalgia   . Chronic kidney disease    follow up with a Dr.   . Chronic systolic CHF (congestive heart failure) (HCC)   . Congenital heart failure (HCC)   . Degeneration of lumbar or  lumbosacral intervertebral disc   . Dementia   . Diabetes mellitus type 2, uncomplicated (HCC) 1996   on insulin since 2011, last eye exam with Dr. Inez Pilgrim 07/2013  . Diabetes mellitus without complication (HCC)   . DVT (deep venous thrombosis) (HCC)   . Essential hypertension    unspecified  . Generalized weakness   . Glaucoma   . Hypercholesteremia   . Hypertension   . Long-term insulin use (HCC)   . Moderate dementia   . Peripheral polyneuropathy   . Peripheral vascular disease (HCC)   . Pneumonia, unspecified organism 04/01/2014  . Pure hypercholesterolemia   . Spinal stenosis   . Spinal stenosis, lumbar region without neurogenic claudication     Medications:  Medications Prior to Admission  Medication Sig Dispense Refill Last Dose  . acetaminophen (TYLENOL) 325 MG tablet Take 650 mg by mouth every 6 (six) hours as needed for mild pain, moderate pain, fever or headache.   PRN at PRN  . albuterol (PROVENTIL HFA;VENTOLIN HFA) 108 (90 Base) MCG/ACT inhaler Inhale 2 puffs into the lungs every 4 (four) hours as needed for wheezing or shortness of breath.   PRN at PRN  . amLODipine (NORVASC) 10 MG tablet Take 1 tablet (10 mg total) by mouth daily. 30 tablet 0 03/10/2017 at 0900  . benzonatate (TESSALON) 100 MG capsule Take 200 mg by mouth every 8 (eight) hours as needed for cough.  PRN at PRN  . bisacodyl (DULCOLAX) 5 MG EC tablet Take 1 tablet (5 mg total) by mouth daily as needed for moderate constipation. 30 tablet 0 PRN at PRN  . cefTRIAXone (ROCEPHIN) IVPB Inject 2 g into the vein once.   03/10/2017 at 1330  . cholecalciferol (VITAMIN D) 1000 UNITS tablet Take 1,000 Units by mouth daily.   03/10/2017 at 0900  . docusate sodium (STOOL SOFTENER) 100 MG capsule Take 100 mg by mouth daily as needed for mild constipation or moderate constipation.    PRN at PRN  . donepezil (ARICEPT) 10 MG tablet Take 10 mg by mouth at bedtime.    03/09/2017 at 2300  . DULoxetine (CYMBALTA) 20 MG  capsule Take 20 mg by mouth daily.   03/10/2017 at 0900  . finasteride (PROSCAR) 5 MG tablet Take 5 mg by mouth daily. *staff : do not handle without gloves*   03/10/2017 at 0900  . furosemide (LASIX) 20 MG tablet Take 2 tablets (40 mg) by mouth daily each morning. Take an additional 1 tablet (20 mg) by mouth  6 hours later.   03/10/2017 at 1730  . glucagon (GLUCAGON EMERGENCY) 1 MG injection Inject 1 mg into the vein once as needed.   PRN at PRN  . GLUCERNA (GLUCERNA) LIQD Take 1 Can by mouth daily.    UTD at UTD  . HYDROcodone-acetaminophen (NORCO/VICODIN) 5-325 MG tablet Take 1 tablet by mouth every 6 (six) hours as needed for moderate pain. 20 tablet 0 PRN at PRN  . Insulin Glargine (TOUJEO SOLOSTAR) 300 UNIT/ML SOPN Inject 60 Units into the skin every morning. 8 am    03/10/2017 at 0900  . insulin regular (NOVOLIN R,HUMULIN R) 100 units/mL injection Inject 3-12 Units into the skin 4 (four) times daily -  before meals and at bedtime. If BS < 60 call NP/PA, If BS is 175 to 250 give 3 units, 251 to 325 give 6 units, 326 to 450, give 10 units, if BS > 450 give 12 units and call NP/PA, check bs ac&hs, if < 175 document 0 units    03/10/2017 at 2015  . ipratropium-albuterol (DUONEB) 0.5-2.5 (3) MG/3ML SOLN Take 3 mLs by nebulization as needed.    PRN at PRN  . memantine (NAMENDA XR) 28 MG CP24 24 hr capsule Take 28 mg by mouth daily.    03/10/2017 at 0900  . metoprolol tartrate (LOPRESSOR) 25 MG tablet Take 1 tablet (25 mg total) by mouth 2 (two) times daily.   03/10/2017 at 0900  . OXYGEN Inhale 2 L into the lungs as needed. Check o2 sat prior to placing on patient and at least every 4 hours after applying   UTD at UTD  . pantoprazole (PROTONIX) 20 MG tablet Take 20 mg by mouth daily.   03/10/2017 at 0530  . pravastatin (PRAVACHOL) 10 MG tablet Take 10 mg by mouth daily.   03/08/2017 at 1700  . pregabalin (LYRICA) 75 MG capsule Take 1 capsule (75 mg total) by mouth 2 (two) times daily. 60 capsule 4 03/10/2017 at  0900  . Probiotic Product (RISA-BID PROBIOTIC) TABS Take 1 tablet by mouth 2 (two) times daily.   UTD at UTD  . rivaroxaban (XARELTO) 20 MG TABS tablet Take 1 tablet by mouth daily.    03/10/2017 at 1730  . sacubitril-valsartan (ENTRESTO) 49-51 MG Take 1 tablet by mouth 2 (two) times daily.   03/10/2017 at 0900  . senna (SENOKOT) 8.6 MG TABS tablet Take 1 tablet (  8.6 mg total) by mouth daily. 100 each 0 03/10/2017 at 0900  . tamsulosin (FLOMAX) 0.4 MG CAPS capsule Take 0.4 mg by mouth at bedtime.   03/09/2017 at 2300  . timolol (TIMOPTIC) 0.5 % ophthalmic solution Place 1 drop into both eyes 2 (two) times daily.    03/10/2017 at 2015  . Wound Dressings (TRIAD HYDROPHILIC WOUND DRESSI) PSTE Apply cream topically to macerated areas of buttocks daily and prn   UTD at UTD    Valentina GuChristy, Yichen Gilardi D, [facility]icsRPH 03/16/2017,3:27 PM

## 2017-03-16 NOTE — Progress Notes (Signed)
Pt was suctioned prior to extubation ror a small amount of thin white secretions. He was extubated per DR. Conforti's order. He was placed on a 2 L nasal cannula and Sa02 = 97%.

## 2017-03-16 NOTE — Progress Notes (Signed)
PULMONARY / CRITICAL CARE MEDICINE   Name: William Byrd MRN: [id number] DOB: 07/03/1931    ADMISSION DATE:  03/11/2017    HISTORY OF PRESENT ILLNESS:   [age] M with very advanced dementia, SNF resident, recently initiated on antibiotics for pneumonia, witnessed to have aspirated while being fed at SNF.  Transferred to [facility] ED where he was found to be minimally responsive with agonal respirations.  Intubated after discussions regarding goals of care with patient's wife.  SUBJECTIVE:  No acute events in the night, still on vent  VITAL SIGNS: BP (!) 146/61   Pulse 75   Temp 99.5 F (37.5 C)   Resp 14   Ht 5\' 11"  (1.803 m)   Wt 185 lb 6.5 oz (84.1 kg)   SpO2 100%   BMI 25.86 kg/m   VENTILATOR SETTINGS: Vent Mode: PRVC FiO2 (%):  [30 %] 30 % Set Rate:  [16 bmp] 16 bmp Vt Set:  [500 mL] 500 mL PEEP:  [5 cmH20] 5 cmH20 Pressure Support:  [5 cmH20] 5 cmH20 Plateau Pressure:  [13 cmH20-19 cmH20] 19 cmH20  INTAKE / OUTPUT: I/O last 3 completed shifts: In: 1357.3 [I.V.:570; NG/GT:737.3; IV Piggyback:50] Out: 3205 [Urine:3055; Emesis/NG output:150]  PHYSICAL EXAMINATION: General:  Remains critically ill on mechanical ventilation Neuro:  Patient is awake, responsive, on fentanyl HEENT: Endotracheal tube in place, no oral lesions appreciated, trachea is midline Cardiovascular:  RRR Lungs:  Coarse expiratory rhonchi appreciated bilateral with central secretions noted Abdomen:  Faint bowel sounds appreciated this morning Musculoskeletal:  Trace edema noted Skin:  Warm and dry  LABS:  BMET Recent Labs  Lab 03/14/17 0348 03/15/17 0651 03/16/17 0407  NA 146* 143 147*  K 3.9 3.8 3.8  CL 117* 113* 118*  CO2 19* 20* 21*  BUN 65* 55* 51*  CREATININE 2.99* 2.65* 2.23*  GLUCOSE 230* 201* 195*    Electrolytes Recent Labs  Lab 03/13/17 0501 03/13/17 2356 03/14/17 0348 03/15/17 0651 03/16/17 0407  CALCIUM 8.5* 8.0* 8.0* 8.5* 8.7*  MG 3.0* 2.9*  --   --   --   PHOS 3.2  2.8  --  3.0 2.8    CBC Recent Labs  Lab 03/14/17 0348 03/15/17 0651 03/16/17 0407  WBC 8.7 6.5 7.3  HGB 7.2* 7.4* 7.2*  HCT 21.5* 22.4* 21.3*  PLT 179 190 193    Coag's Recent Labs  Lab 03/11/17 1531  APTT 39*  INR 2.33    Sepsis Markers Recent Labs  Lab 03/11/17 1201 03/11/17 1531  LATICACIDVEN 1.9 2.2*  PROCALCITON  --  41.70    ABG Recent Labs  Lab 03/11/17 1620  PHART 7.37  PCO2ART 41  PO2ART PENDING    Liver Enzymes Recent Labs  Lab 03/11/17 1201 03/15/17 0651 03/16/17 0407  AST 36  --   --   ALT 27  --   --   ALKPHOS 80  --   --   BILITOT 0.7  --   --   ALBUMIN 3.0* 2.2* 2.2*    Cardiac Enzymes Recent Labs  Lab 03/11/17 1201  TROPONINI 0.03*    Glucose Recent Labs  Lab 03/15/17 1305 03/15/17 1604 03/15/17 1919 03/15/17 2347 03/16/17 0326 03/16/17 0829  GLUCAP 180* 200* 179* 174* 178* 181*    Imaging No results found.   ASSESSMENT / PLAN:  PULMONARY A: Acute Respiratory Failure on vent support secondary to Aspiration Pneumonia, Tolerating SBT but is very weak with secretions P:   Continue present vent settings. FIO2 now  at 30% Continue lung protective strategy ventilation Continue on lung recruitment protocol Continue SBT. Will check with family regarding goals of care and extubation  CARDIOVASCULAR A: No active issues P:  Monitor rhythm and BP MAP goal >65 mmHg  DNR in event of cardiac arrest  RENAL A:   CKD AKI, nonoliguric -creatinine slowly improving  P:   IV free water D/C; continue with NGT free water, hopefully secretions will decrease with this intervention Monitor I/Os Correct electrolytes as indicated Nephrology following.   GASTROINTESTINAL A:   Abdominal distention- much improved Constipation/obstipation- resolved P:   SUP: IV famotidine Dulcolax suppository ordered 1/31 Continue on bowel regimen  HEMATOLOGIC A:   Critical Illness anemia P:  Transfuse PRBC for hemoglobin <  7  INFECTIOUS A:   Aspiration Pneumonia on vent support P:   Leukocytosis has reolved Continue ABX - Cefepime to course  ENDOCRINE A:   Type 2 DM with hyperglycemia since starting TF P:   Continue Target Glucose monitoring and coverage  NEUROLOGIC A:   Acute Metabolic Encephalopathy- improving off sedatives Hx of Advance Dementia P:   RASS goal: 1-2 Will try to avoid sedatives if possible    35 minutes in critical care time  Tora Kindred, DO  03/16/2017, 8:36 AM Patient ID: William Byrd, male   DOB: May 12, 1931, 84 y.o.   MRN: [id number]

## 2017-03-16 NOTE — Progress Notes (Signed)
[facility], [place] 03/16/17  Subjective:   Patient remains critically ill, intubated Ventilator assisted at present.  FiO2 30%, PEEP 5 He is able to follow simple commands   Objective:  Vital signs in last 24 hours:  Temp:  [99.3 F (37.4 C)-100 F (37.8 C)] 99.5 F (37.5 C) (02/04 0800) Pulse Rate:  [74-119] 81 (02/04 0800) Resp:  [12-32] 14 (02/04 0800) BP: (118-156)/(61-96) 139/66 (02/04 0800) SpO2:  [94 %-100 %] 98 % (02/04 0800) FiO2 (%):  [30 %] 30 % (02/04 0303)  Weight change:  Filed Weights   03/11/17 1225 03/11/17 1600  Weight: 79.8 kg (176 lb) 84.1 kg (185 lb 6.5 oz)    Intake/Output:    Intake/Output Summary (Last 24 hours) at 03/16/2017 1003 Last data filed at 03/16/2017 0800 Gross per 24 hour  Intake 793.33 ml  Output 1700 ml  Net -906.67 ml     Physical Exam: General:  Critically ill-appearing  HEENT  ET tube in place  Neck  no distended neck pain  Pulm/lungs  ventilator assisted, FiO2 30%  CVS/Heart  regular rhythm  Abdomen:    Distended, BS present  Extremities: ++ edema  Neurologic: Able to follow simple commands  Skin: No acute rashes   Foley in place       Basic Metabolic Panel:  Recent Labs  Lab 03/13/17 0501 03/13/17 2356 03/14/17 0348 03/15/17 0651 03/16/17 0407  NA 152* 145 146* 143 147*  K 4.1 4.1 3.9 3.8 3.8  CL 117* 117* 117* 113* 118*  CO2 21* 20* 19* 20* 21*  GLUCOSE 199* 283* 230* 201* 195*  BUN 72* 70* 65* 55* 51*  CREATININE 3.21* 3.11* 2.99* 2.65* 2.23*  CALCIUM 8.5* 8.0* 8.0* 8.5* 8.7*  MG 3.0* 2.9*  --   --   --   PHOS 3.2 2.8  --  3.0 2.8     CBC: Recent Labs  Lab 03/11/17 1201 03/12/17 0756 03/13/17 0501 03/14/17 0348 03/15/17 0651 03/16/17 0407  WBC 8.9 12.0* 10.4 8.7 6.5 7.3  NEUTROABS 7.1*  --   --   --   --   --   HGB 8.9* 7.5* 7.6* 7.2* 7.4* 7.2*  HCT 26.9* 23.5* 23.4* 21.5* 22.4* 21.3*  MCV 91.4 91.8 92.1 91.5 90.0 90.2  PLT 285 194 183 179 190 193     No  results found for: HEPBSAG, HEPBSAB, HEPBIGM    Microbiology:  Recent Results (from the past 240 hour(s))  Blood Culture (routine x 2)     Status: None   Collection Time: 03/11/17 12:01 PM  Result Value Ref Range Status   Specimen Description BLOOD LEFT WRIST  Final   Special Requests   Final    BOTTLES DRAWN AEROBIC AND ANAEROBIC Blood Culture adequate volume   Culture   Final    NO GROWTH 5 DAYS Performed at [facility], [address], [place], [place] [address]    Report Status 03/16/2017 FINAL  Final  Urine culture     Status: None   Collection Time: 03/11/17 12:36 PM  Result Value Ref Range Status   Specimen Description   Final    URINE, RANDOM Performed at [facility], [address], [place], [place] [address]    Special Requests   Final    NONE Performed at [facility], [address] [place], [place] [address]    Culture   Final    NO GROWTH Performed at [facility],  [address], [place], [place] [address]    Report Status 03/13/2017 FINAL  Final  Culture, respiratory (NON-Expectorated)     Status: None   Collection Time: 03/11/17  3:51 PM  Result Value Ref Range Status   Specimen Description   Final    TRACHEAL ASPIRATE Performed at [facility], [address], [place], [place] [address]    Special Requests   Final    NONE Performed at [facility], [address] [place], [place] [address]    Gram Stain   Final    FEW WBC PRESENT, PREDOMINANTLY PMN FEW YEAST Performed at [facility], [place] [address], [place], [place] [address]    Culture MODERATE CANDIDA ALBICANS  Final   Report Status 03/14/2017 FINAL  Final  MRSA PCR Screening     Status: None   Collection Time: 03/11/17  4:41 PM  Result Value Ref Range Status   MRSA by PCR NEGATIVE NEGATIVE Final    Comment:        The GeneXpert MRSA Assay (FDA approved for NASAL specimens only), is one component of a comprehensive MRSA  colonization surveillance program. It is not intended to diagnose MRSA infection nor to guide or monitor treatment for MRSA infections. Performed at [facility], [address] [place], [place] [address]     Coagulation Studies: No results for input(s): LABPROT, INR in the last 72 hours.  Urinalysis: No results for input(s): COLORURINE, LABSPEC, PHURINE, GLUCOSEU, HGBUR, BILIRUBINUR, KETONESUR, PROTEINUR, UROBILINOGEN, NITRITE, LEUKOCYTESUR in the last 72 hours.  Invalid input(s): APPERANCEUR    Imaging: US Venous Img Upper Uni Left  Result Date: 03/16/2017 CLINICAL DATA:  84 year old male with a history of swelling EXAM: LEFT UPPER EXTREMITY VENOUS DOPPLER ULTRASOUND TECHNIQUE: Gray-scale sonography with graded compression, as well as color Doppler and duplex ultrasound were performed to evaluate the upper extremity deep venous system from the level of the subclavian vein and including the jugular, axillary, basilic, radial, ulnar and upper cephalic vein. Spectral Doppler was utilized to evaluate flow at rest and with distal augmentation maneuvers. COMPARISON:  None. FINDINGS: Contralateral Subclavian Vein: Respiratory phasicity is normal and symmetric with the symptomatic side. No evidence of thrombus. Normal compressibility. Internal Jugular Vein: No evidence of thrombus. Normal compressibility, respiratory phasicity and response to augmentation. Subclavian Vein: No evidence of thrombus. Normal compressibility, respiratory phasicity and response to augmentation. Axillary Vein: No evidence of thrombus. Normal compressibility, respiratory phasicity and response to augmentation. Cephalic Vein: No evidence of thrombus. Normal compressibility, respiratory phasicity and response to augmentation. Basilic Vein: No evidence of thrombus. Normal compressibility, respiratory phasicity and response to augmentation. Brachial Veins: Paired brachial veins are imaged. One of 2 brachial veins  demonstrates occlusive thrombus. Radial Veins: No evidence of thrombus. Normal compressibility, respiratory phasicity and response to augmentation. Ulnar Veins: No evidence of thrombus. Normal compressibility, respiratory phasicity and response to augmentation. Other Findings:  None visualized. IMPRESSION: Sonographic survey of the right upper extremity is positive for occlusive DVT in 1 of 2 paired brachial veins. Electronically Signed   By: Corrie Mckusick D.O.   On: 03/16/2017 09:02   Dg Chest Port 1 View  Result Date: 03/16/2017 CLINICAL DATA:  Ventilator EXAM: PORTABLE CHEST 1 VIEW COMPARISON:  03/15/2017 FINDINGS: Endotracheal tube and NG tube are unchanged. Patchy bilateral lower lobe airspace opacities are noted, right greater than left, not significantly changed. Heart is borderline in size. No effusions or acute bony abnormality. IMPRESSION: Patchy bilateral lower lobe airspace  opacities, right greater than left concerning for pneumonia. No real change. Electronically Signed   By: Rolm Baptise M.D.   On: 03/16/2017 09:36   Dg Chest Port 1 View  Result Date: 03/15/2017 CLINICAL DATA:  Respiratory failure. EXAM: PORTABLE CHEST 1 VIEW COMPARISON:  03/13/2017 FINDINGS: Endotracheal tube terminates 2.5 cm above the carina. Cardiomediastinal silhouette is normal. Mediastinal contours appear intact. There is no evidence of pneumothorax. Slight worsening and patchy right lung alveolar and interstitial pulmonary opacities. Milder peribronchial airspace consolidation in the left lower lobe. Osseous structures are without acute abnormality. Soft tissues are grossly normal. IMPRESSION: Slight worsening in the patchy right alveolar and interstitial pulmonary infiltrates. More subtle peribronchial airspace disease in the left lower lobe. Electronically Signed   By: Fidela Salisbury M.D.   On: 03/15/2017 08:01     Medications:   . ceFEPime (MAXIPIME) IV Stopped (03/15/17 1125)  . famotidine (PEPCID) IV 20  mg (03/15/17 1815)  . feeding supplement (VITAL AF 1.2 CAL) 1,000 mL (03/16/17 0800)  . fentaNYL infusion INTRAVENOUS    . norepinephrine (LEVOPHED) Adult infusion Stopped (03/12/17 0701)  . sodium chloride     . chlorhexidine gluconate (MEDLINE KIT)  15 mL Mouth Rinse BID  . free water  200 mL Per Tube Q8H  . heparin  5,000 Units Subcutaneous Q8H  . insulin aspart  0-15 Units Subcutaneous Q4H  . mouth rinse  15 mL Mouth Rinse 10 times per day  . senna-docusate  1 tablet Per Tube BID  . sodium phosphate  1 enema Rectal Once   acetaminophen **OR** acetaminophen, albuterol, bisacodyl, fentaNYL (SUBLIMAZE) injection, [DISCONTINUED] ondansetron **OR** ondansetron (ZOFRAN) IV  Assessment/ Plan:  84 y.o. african [place]  male  with diabetes mellitus type II, hypertension, dementia, diabetic neuropathy, coronary artery disease, CVA  1.   Acute renal failure 2.  Chronic kidney disease stage IV, baseline creatinine 2.39, GFR 27 on November 14, 2016 3.  Generalized edema 4.  Hypernatremia 5.  Acute respiratory failure requiring mechanical ventilation  Patient's urine output is fair at 1650 cc.  He has a Foley catheter in place and has clear yellow urine in Foley bag.  Tube feeds at 30 cc/h.  Plan is to extubate later today.  S Creatinine is close to baseline  - Continue supportive care - Will follow - consider increasing free water to 200 mL q 4 hrs if extubation is deferred     LOS: [place] 2/4/201910:03 [place], [place]

## 2017-03-16 NOTE — Progress Notes (Signed)
Nutrition Follow-up  DOCUMENTATION CODES:   Not applicable  INTERVENTION:  Will discontinue tube feed order as patient is now extubated and OGT has been removed.  Will monitor for diet advancement and make nutrition recommendations as indicated. Will also monitor discussions regarding goals of care.  NUTRITION DIAGNOSIS:   Inadequate oral intake related to inability to eat as evidenced by NPO status.  Ongoing.  GOAL:   Provide needs based on ASPEN/SCCM guidelines  Had been met with TF regimen.  MONITOR:   Vent status, Labs, Weight trends, TF tolerance, I & O's  REASON FOR ASSESSMENT:   Ventilator    ASSESSMENT:   85 year old male with PMHx of advanced dementia, DM type 2, peripheral polyneuropathy, HTN, arthritis, CKD stage IV, hypercholesterolemia, glaucoma, hx DVT, CHF who presented from [facility] with unresponsiveness after presumed aspiration event and abdomen was hard and distended. Found to have acute hypoxemic respiratory failure requiring intubation 1/30, AKI on CKD IV, chronic anemia without acute blood loss.   -Per review of medication history tube feeds were stopped late evening of 2/1 in setting of abdominal distention. He was then started on tube feeds the next day with VAF 1.2 at 10 mL/hr and advanced slowly to 40 mL/hr.  No family members present at time of RD assessment. Patient was receiving Vital AF 1.2 at 30 mL/hr and tolerating well per RN. Following RD assessment patient was extubated. Per discussion in rounds no plans for re-intubation. OGT was removed.  Medications reviewed and include: Novolog 0-15 units Q4hrs, Senokot BID, cefepime, famotidine.  Labs reviewed: CBG 156-200, Sodium 147, Chloride 118,  CO2 21, BUN 51, Creatinine 2.23.  Discussed with RN and on rounds.  Diet Order:  No diet orders on file  EDUCATION NEEDS:   No education needs have been identified at this time  Skin:  Skin Assessment: Skin Integrity Issues:(MSAD to  buttocks)  Last BM:  03/15/2017 - smear type 5  Height:   Ht Readings from Last 1 Encounters:  03/11/17 5' 11" (1.803 m)    Weight:   Wt Readings from Last 1 Encounters:  03/11/17 185 lb 6.5 oz (84.1 kg)    Ideal Body Weight:  78.2 kg  BMI:  Body mass index is 25.86 kg/m.  Estimated Nutritional Needs:   Kcal:  1875-2190 (MSJ x 1.2-1.4)  Protein:  100-125 grams (1.2-1.5 grams/kg)  Fluid:  2 L/day (25 mL/kg IBW)   [place], MS, RD, LDN Office: [phone] Pager: [phone] After Hours/Weekend Pager: [phone]  

## 2017-03-16 NOTE — Progress Notes (Signed)
[employer] - [place] at [facility]   PATIENT NAME: Calin Fantroy    MR#:  [id number]  DATE OF BIRTH:  [birth year]  SUBJECTIVE:  CHIEF COMPLAINT:   Chief Complaint  Patient presents with  . Pneumonia   -Intubated and sedated. Admitted for possible aspiration pneumonia. Critically ill appearing.   Tapering sedation for now.  REVIEW OF SYSTEMS:  Review of Systems  Unable to perform ROS: Critical illness   Patient is alert but still intubated.  DRUG ALLERGIES:   Allergies  Allergen Reactions  . Fosinopril Other (See Comments)    Other reaction(s): Other (See Comments) Hyperkalemia Reaction: Hyperkalemia     VITALS:  Blood pressure (!) 146/72, pulse 90, temperature 99.3 F (37.4 C), resp. rate 16, height 5\' 11"  (1.803 m), weight 84.1 kg (185 lb 6.5 oz), SpO2 97 %.  PHYSICAL EXAMINATION:  Physical Exam  GENERAL:  84 y.o.-year-old critically ill appearing patient lying in the bed, Intubated and sedated EYES: Pupils equal, round, reactive to light and accommodation. No scleral icterus.Marland Kitchen  HEENT: Head atraumatic, normocephalic. Oropharynx and nasopharynx clear.  NECK:  Supple, no jugular venous distention. No thyroid enlargement, no tenderness. OT tube present LUNGS: scant breath sounds bilaterally, no wheezing, rales  or crepitation. No use of accessory muscles of respiration. Decreased bibasilar breath sounds CARDIOVASCULAR: S1, S2 normal. No rubs, or gallops. 3/6 systolic murmur present ABDOMEN: Firm and distended. Nontender. Bowel sounds present. No organomegaly or mass.  EXTREMITIES: No pedal edema, cyanosis, or clubbing.  NEUROLOGIC: Tapering off sedation, patient opens eyes to calling his name . Does not move limbs. PSYCHIATRIC: The patient is intubated and sedated SKIN: No obvious rash, lesion, or ulcer.    LABORATORY PANEL:   CBC Recent Labs  Lab 03/16/17 0407  WBC 7.3  HGB 7.2*  HCT 21.3*  PLT 193    ------------------------------------------------------------------------------------------------------------------  Chemistries  Recent Labs  Lab 03/11/17 1201  03/13/17 2356  03/16/17 0407  NA 148*   < > 145   < > 147*  K 5.3*   < > 4.1   < > 3.8  CL 111   < > 117*   < > 118*  CO2 23   < > 20*   < > 21*  GLUCOSE 127*   < > 283*   < > 195*  BUN 92*   < > 70*   < > 51*  CREATININE 4.97*   < > 3.11*   < > 2.23*  CALCIUM 9.1   < > 8.0*   < > 8.7*  MG  --    < > 2.9*  --   --   AST 36  --   --   --   --   ALT 27  --   --   --   --   ALKPHOS 80  --   --   --   --   BILITOT 0.7  --   --   --   --    < > = values in this interval not displayed.   ------------------------------------------------------------------------------------------------------------------  Cardiac Enzymes Recent Labs  Lab 03/11/17 1201  TROPONINI 0.03*   ------------------------------------------------------------------------------------------------------------------  RADIOLOGY:  US Venous Img Upper Uni Left  Result Date: 03/16/2017 CLINICAL DATA:  84 year old male with a history of swelling EXAM: LEFT UPPER EXTREMITY VENOUS DOPPLER ULTRASOUND TECHNIQUE: Gray-scale sonography with graded compression, as well as color Doppler and duplex ultrasound were performed to evaluate the upper extremity deep venous system from the  level of the subclavian vein and including the jugular, axillary, basilic, radial, ulnar and upper cephalic vein. Spectral Doppler was utilized to evaluate flow at rest and with distal augmentation maneuvers. COMPARISON:  None. FINDINGS: Contralateral Subclavian Vein: Respiratory phasicity is normal and symmetric with the symptomatic side. No evidence of thrombus. Normal compressibility. Internal Jugular Vein: No evidence of thrombus. Normal compressibility, respiratory phasicity and response to augmentation. Subclavian Vein: No evidence of thrombus. Normal compressibility, respiratory phasicity  and response to augmentation. Axillary Vein: No evidence of thrombus. Normal compressibility, respiratory phasicity and response to augmentation. Cephalic Vein: No evidence of thrombus. Normal compressibility, respiratory phasicity and response to augmentation. Basilic Vein: No evidence of thrombus. Normal compressibility, respiratory phasicity and response to augmentation. Brachial Veins: Paired brachial veins are imaged. One of 2 brachial veins demonstrates occlusive thrombus. Radial Veins: No evidence of thrombus. Normal compressibility, respiratory phasicity and response to augmentation. Ulnar Veins: No evidence of thrombus. Normal compressibility, respiratory phasicity and response to augmentation. Other Findings:  None visualized. IMPRESSION: Sonographic survey of the right upper extremity is positive for occlusive DVT in 1 of 2 paired brachial veins. Electronically Signed   By: Gilmer MorJaime  Wagner D.O.   On: 03/16/2017 09:02   Dg Chest Port 1 View  Result Date: 03/16/2017 CLINICAL DATA:  Ventilator EXAM: PORTABLE CHEST 1 VIEW COMPARISON:  03/15/2017 FINDINGS: Endotracheal tube and NG tube are unchanged. Patchy bilateral lower lobe airspace opacities are noted, right greater than left, not significantly changed. Heart is borderline in size. No effusions or acute bony abnormality. IMPRESSION: Patchy bilateral lower lobe airspace opacities, right greater than left concerning for pneumonia. No real change. Electronically Signed   By: Charlett NoseKevin  Dover M.D.   On: 03/16/2017 09:36   Dg Chest Port 1 View  Result Date: 03/15/2017 CLINICAL DATA:  Respiratory failure. EXAM: PORTABLE CHEST 1 VIEW COMPARISON:  03/13/2017 FINDINGS: Endotracheal tube terminates 2.5 cm above the carina. Cardiomediastinal silhouette is normal. Mediastinal contours appear intact. There is no evidence of pneumothorax. Slight worsening and patchy right lung alveolar and interstitial pulmonary opacities. Milder peribronchial airspace consolidation in  the left lower lobe. Osseous structures are without acute abnormality. Soft tissues are grossly normal. IMPRESSION: Slight worsening in the patchy right alveolar and interstitial pulmonary infiltrates. More subtle peribronchial airspace disease in the left lower lobe. Electronically Signed   By: Ted Mcalpineobrinka  Dimitrova M.D.   On: 03/15/2017 08:01    EKG:   Orders placed or performed during the hospital encounter of 03/11/17  . ED EKG 12-Lead  . ED EKG 12-Lead  . EKG 12-Lead  . EKG 12-Lead    ASSESSMENT AND PLAN:    84 year old male with multiple medical problems including advanced dementia who is bedbound at the nursing home at baseline, CKD, congestive heart failure, diabetes mellitus, hypertension, spinal stenosis and neuropathy presents to hospital secondary to unresponsive episode.  1.  Acute hypoxic respiratory failure-abdominal respirations on arrival.  Intubated for hypoxia and airway protection. -Continues on the ventilator.  Appreciate intensivist consult -On 40% FiO2 at this time.  Chest x-ray with bibasilar pneumonia.  Concern for aspiration. -Blood cultures are negative.  Continue cefepime at this time. -Vancomycin has been discontinued  2.  Acute renal failure on CKD stage IV-baseline creatinine of 2.3, now much worse secondary to sepsis and prerenal azotemia -Family agree with no escalation of care and definitely no hemodialysis. -stopped fluids now. -Monitor urine output.  Renal ultrasound, avoid nephrotoxins -Slightly improved creatinine  3.  Sepsis-secondary to healthcare acquired pneumonia.  On ventilator, antibiotics as described above -Elevated pro-calcitonin -On low-dose pressors- improved, off.  4. Hyponatremia-free water deficit.  On D5 1/2 normal saline  5.  Acute on chronic anemia-has anemia of chronic kidney disease-no active bleeding.  Hemoglobin dropped from baseline of 9 to 7.5.  Partly dilutional and also acute illness. -Monitor closely, transfuse if  hemoglobin is less than 7.  6.  DVT prophylaxis-on subcu heparin.  Hold if hemoglobin drop is significant  7. Nutrition- since abdomen is distended, currently has an NG tube for suction. Likely has ileus. Not on any tube feeds yet  Overall poor prognosis.  Intensivist had a family meeting with patient's family and they have agreed for DNR for now, no further escalation of care at this time.  Continue vent support for the next 1-2 days and further decisions based on patient change in clinical status.  extubation after discussing with family as per ICU team, very likely to fail to tolerate as per ICU physician.   All the records are reviewed and case discussed with Care Management/Social Workerr. Management plans discussed with the patient, family and they are in agreement.  CODE STATUS: DNR  TOTAL TIME TAKING CARE OF THIS PATIENT: 30 minutes.   POSSIBLE D/C IN 2-3 DAYS, DEPENDING ON CLINICAL CONDITION.   Altamese Dilling M.D on 03/16/2017 at 3:37 PM  Between 7am to 6pm - Pager - [phone]  After 6pm go to [email or web address] - Social research officer, government  [employer]  [facility]  Office  [phone]  CC: Primary care physician; Marisue Ivan, MD

## 2017-03-16 NOTE — Progress Notes (Signed)
Patient doing well post-extubation. Still has lots of secretions but has a good cough reflex. Currently on 3L [place]. Will order NTS, Chest PT and transfer patient out of the ICU Patient is a DNR/DNI  Jamae Tison [facility]. [facility] ANP-BC Pulmonary and Critical Care Medicine [facility] Pager [phone] or [phone]  NB: This document was prepared using Dragon voice recognition software and may include unintentional dictation errors.

## 2017-03-16 NOTE — Progress Notes (Signed)
Patient ID: William Byrd, male   DOB: 07/06/1931, 84 y.o.   MRN: [id number] Pulmonary/critical care  Family was present for group discussion. They agree that he is a DO NOT RESUSCITATE and that he and they would not want him to be reintubated. They understand that he is very weak and that he may have difficulty without the ventilator. Most important thing to them into our team at this point is making sure he is comfortable without air hunger and in no pain. We will proceed with extubation. As needed fentanyl and Ativan  Tora KindredJohn Roselinda Bahena, D.O.

## 2017-03-16 NOTE — Progress Notes (Signed)
Notified wife Lewayne BuntingFannie Mahnke of patient transfer to room 112A.

## 2017-03-16 NOTE — Progress Notes (Signed)
Pt transfer to 112A.

## 2017-03-17 DIAGNOSIS — J189 Pneumonia, unspecified organism: Secondary | ICD-10-CM

## 2017-03-17 DIAGNOSIS — Z515 Encounter for palliative care: Secondary | ICD-10-CM

## 2017-03-17 DIAGNOSIS — L8990 Pressure ulcer of unspecified site, unspecified stage: Secondary | ICD-10-CM

## 2017-03-17 DIAGNOSIS — R140 Abdominal distension (gaseous): Secondary | ICD-10-CM

## 2017-03-17 LAB — BLOOD GAS, ARTERIAL
Acid-base deficit: 1.5 mmol/L (ref 0.0–2.0)
Bicarbonate: 23.7 mmol/L (ref 20.0–28.0)
FIO2: 0.4
LHR: 16 {breaths}/min
O2 Saturation: 61.3 %
PEEP: 5 cmH2O
Patient temperature: 37
VT: 500 mL
pCO2 arterial: 41 mmHg (ref 32.0–48.0)
pH, Arterial: 7.37 (ref 7.350–7.450)

## 2017-03-17 LAB — GLUCOSE, CAPILLARY
GLUCOSE-CAPILLARY: 135 mg/dL — AB (ref 65–99)
GLUCOSE-CAPILLARY: 136 mg/dL — AB (ref 65–99)
GLUCOSE-CAPILLARY: 150 mg/dL — AB (ref 65–99)
Glucose-Capillary: 110 mg/dL — ABNORMAL HIGH (ref 65–99)
Glucose-Capillary: 131 mg/dL — ABNORMAL HIGH (ref 65–99)
Glucose-Capillary: 134 mg/dL — ABNORMAL HIGH (ref 65–99)
Glucose-Capillary: 135 mg/dL — ABNORMAL HIGH (ref 65–99)
Glucose-Capillary: 144 mg/dL — ABNORMAL HIGH (ref 65–99)

## 2017-03-17 MED ORDER — GLYCOPYRROLATE 0.2 MG/ML IJ SOLN
0.1000 mg | INTRAMUSCULAR | Status: DC | PRN
  Administered 2017-03-17: 16:00:00 0.1 mg via INTRAVENOUS
  Filled 2017-03-17 (×3): qty 0.5

## 2017-03-17 MED ORDER — LABETALOL HCL 5 MG/ML IV SOLN: 10.0000 mg | Freq: Four times a day (QID) | INTRAVENOUS | Status: DC | PRN

## 2017-03-17 MED ORDER — SODIUM CHLORIDE 0.9 % IV SOLN
INTRAVENOUS | Status: DC
  Administered 2017-03-17 – 2017-03-18 (×2): via INTRAVENOUS

## 2017-03-17 NOTE — Progress Notes (Signed)
[facility], [place] 03/17/17  Subjective:   Patient was extubated yesterday Now on 2-3 L of [place] Patient's wife is at bedside States that he has been lethargic all day  Objective:  Vital signs in last 24 hours:  Temp:  [98.9 F (37.2 C)-99.5 F (37.5 C)] 98.9 F (37.2 C) (02/05 0429) Pulse Rate:  [78-108] 108 (02/05 0429) Resp:  [14-18] 16 (02/05 0429) BP: (100-168)/(58-88) 168/88 (02/05 0429) SpO2:  [95 %-100 %] 95 % (02/05 0429)  Weight change:  Filed Weights   03/11/17 1225 03/11/17 1600  Weight: 79.8 kg (176 lb) 84.1 kg (185 lb 6.5 oz)    Intake/Output:    Intake/Output Summary (Last 24 hours) at 03/17/2017 1432 Last data filed at 03/17/2017 1334 Gross per 24 hour  Intake 50 ml  Output 1465 ml  Net -1415 ml     Physical Exam: General:  Critically ill-appearing  HEENT  moist oral mucous membranes  Neck  no distended neck pain  Pulm/lungs  coarse breath sounds at bases, nasal cannula oxygen  CVS/Heart  regular rhythm  Abdomen:    Distended, BS present  Extremities: ++ edema  Neurologic:  Lethargic but was able to respond to sound  Skin: No acute rashes   Foley in place       Basic Metabolic Panel:  Recent Labs  Lab 03/13/17 0501 03/13/17 2356 03/14/17 0348 03/15/17 0651 03/16/17 0407  NA 152* 145 146* 143 147*  K 4.1 4.1 3.9 3.8 3.8  CL 117* 117* 117* 113* 118*  CO2 21* 20* 19* 20* 21*  GLUCOSE 199* 283* 230* 201* 195*  BUN 72* 70* 65* 55* 51*  CREATININE 3.21* 3.11* 2.99* 2.65* 2.23*  CALCIUM 8.5* 8.0* 8.0* 8.5* 8.7*  MG 3.0* 2.9*  --   --   --   PHOS 3.2 2.8  --  3.0 2.8     CBC: Recent Labs  Lab 03/11/17 1201 03/12/17 0756 03/13/17 0501 03/14/17 0348 03/15/17 0651 03/16/17 0407  WBC 8.9 12.0* 10.4 8.7 6.5 7.3  NEUTROABS 7.1*  --   --   --   --   --   HGB 8.9* 7.5* 7.6* 7.2* 7.4* 7.2*  HCT 26.9* 23.5* 23.4* 21.5* 22.4* 21.3*  MCV 91.4 91.8 92.1 91.5 90.0 90.2  PLT 285 194 183 179 190 193     No  results found for: HEPBSAG, HEPBSAB, HEPBIGM    Microbiology:  Recent Results (from the past 240 hour(s))  Blood Culture (routine x 2)     Status: None   Collection Time: 03/11/17 12:01 PM  Result Value Ref Range Status   Specimen Description BLOOD LEFT WRIST  Final   Special Requests   Final    BOTTLES DRAWN AEROBIC AND ANAEROBIC Blood Culture adequate volume   Culture   Final    NO GROWTH 5 DAYS Performed at [facility], [address] [place], [place] [address]    Report Status 03/16/2017 FINAL  Final  Urine culture     Status: None   Collection Time: 03/11/17 12:36 PM  Result Value Ref Range Status   Specimen Description   Final    URINE, RANDOM Performed at [facility], [address] [place], [place] [address]    Special Requests   Final    NONE Performed at [facility], [address], [place],  [address]    Culture   Final    NO GROWTH Performed at [facility],  [address], [place], [place] [address]    Report Status 03/13/2017 FINAL  Final  Culture, respiratory (NON-Expectorated)     Status: None   Collection Time: 03/11/17  3:51 PM  Result Value Ref Range Status   Specimen Description   Final    TRACHEAL ASPIRATE Performed at [facility], [address], [place], [place] [address]    Special Requests   Final    NONE Performed at [facility], [address] [place], [place] [address]    Gram Stain   Final    FEW WBC PRESENT, PREDOMINANTLY PMN FEW YEAST Performed at [facility], [place] [address], [place], [place] [address]    Culture MODERATE CANDIDA ALBICANS  Final   Report Status 03/14/2017 FINAL  Final  MRSA PCR Screening     Status: None   Collection Time: 03/11/17  4:41 PM  Result Value Ref Range Status   MRSA by PCR NEGATIVE NEGATIVE Final    Comment:        The GeneXpert MRSA Assay (FDA approved for NASAL specimens only), is one component of a comprehensive MRSA  colonization surveillance program. It is not intended to diagnose MRSA infection nor to guide or monitor treatment for MRSA infections. Performed at [facility], [address] [place], [place] [address]     Coagulation Studies: No results for input(s): LABPROT, INR in the last 72 hours.  Urinalysis: No results for input(s): COLORURINE, LABSPEC, PHURINE, GLUCOSEU, HGBUR, BILIRUBINUR, KETONESUR, PROTEINUR, UROBILINOGEN, NITRITE, LEUKOCYTESUR in the last 72 hours.  Invalid input(s): APPERANCEUR    Imaging: US Venous Img Upper Uni Left  Result Date: 03/16/2017 CLINICAL DATA:  84 year old male with a history of swelling EXAM: LEFT UPPER EXTREMITY VENOUS DOPPLER ULTRASOUND TECHNIQUE: Gray-scale sonography with graded compression, as well as color Doppler and duplex ultrasound were performed to evaluate the upper extremity deep venous system from the level of the subclavian vein and including the jugular, axillary, basilic, radial, ulnar and upper cephalic vein. Spectral Doppler was utilized to evaluate flow at rest and with distal augmentation maneuvers. COMPARISON:  None. FINDINGS: Contralateral Subclavian Vein: Respiratory phasicity is normal and symmetric with the symptomatic side. No evidence of thrombus. Normal compressibility. Internal Jugular Vein: No evidence of thrombus. Normal compressibility, respiratory phasicity and response to augmentation. Subclavian Vein: No evidence of thrombus. Normal compressibility, respiratory phasicity and response to augmentation. Axillary Vein: No evidence of thrombus. Normal compressibility, respiratory phasicity and response to augmentation. Cephalic Vein: No evidence of thrombus. Normal compressibility, respiratory phasicity and response to augmentation. Basilic Vein: No evidence of thrombus. Normal compressibility, respiratory phasicity and response to augmentation. Brachial Veins: Paired brachial veins are imaged. One of 2 brachial veins  demonstrates occlusive thrombus. Radial Veins: No evidence of thrombus. Normal compressibility, respiratory phasicity and response to augmentation. Ulnar Veins: No evidence of thrombus. Normal compressibility, respiratory phasicity and response to augmentation. Other Findings:  None visualized. IMPRESSION: Sonographic survey of the right upper extremity is positive for occlusive DVT in 1 of 2 paired brachial veins. Electronically Signed   By: Corrie Mckusick D.O.   On: 03/16/2017 09:02   Dg Chest Port 1 View  Result Date: 03/16/2017 CLINICAL DATA:  Ventilator EXAM: PORTABLE CHEST 1 VIEW COMPARISON:  03/15/2017 FINDINGS: Endotracheal tube and NG tube are unchanged. Patchy bilateral lower lobe airspace opacities are noted, right greater than left, not significantly changed. Heart is borderline in size. No effusions or acute bony abnormality. IMPRESSION: Patchy bilateral lower lobe airspace  opacities, right greater than left concerning for pneumonia. No real change. Electronically Signed   By: Rolm Baptise M.D.   On: 03/16/2017 09:36     Medications:   . ceFEPime (MAXIPIME) IV Stopped (03/16/17 1136)  . famotidine (PEPCID) IV Stopped (03/16/17 1814)  . sodium chloride     . chlorhexidine gluconate (MEDLINE KIT)  15 mL Mouth Rinse BID  . heparin  5,000 Units Subcutaneous Q8H  . insulin aspart  0-15 Units Subcutaneous Q4H  . senna-docusate  1 tablet Per Tube BID  . sodium phosphate  1 enema Rectal Once   acetaminophen **OR** acetaminophen, albuterol, bisacodyl, LORazepam, morphine injection, [DISCONTINUED] ondansetron **OR** ondansetron (ZOFRAN) IV  Assessment/ Plan:  84 y.o. african [place]  male  with diabetes mellitus type II, hypertension, dementia, diabetic neuropathy, coronary artery disease, CVA  1.   Acute renal failure 2.  Chronic kidney disease stage IV, baseline creatinine 2.39, GFR 27 on November 14, 2016 3.  Generalized edema 4.  Hypernatremia 5.  Acute respiratory failure requiring  mechanical ventilation, extubated 2/4    - Continue supportive care - With hypernatremia, consider half normal saline for maintenance fluids - Check renal panel tomorrow     LOS: [place] 2/5/20192:32 PM  [place], [place]

## 2017-03-17 NOTE — Care Management Important Message (Signed)
Important Message  Patient Details  Name: William Byrd MRN: [id number] Date of Birth: 09/07/1931   Medicare Important Message Given:  Yes    [name], RN 03/17/2017, 7:20 AM

## 2017-03-17 NOTE — Progress Notes (Signed)
Patient with a large amount of tan phlegm in his mouth.  Patient suctioned and mouth care performed.  Orson Apeanielle Debbora Ang, RN, BSN

## 2017-03-17 NOTE — Progress Notes (Signed)
SLP Cancellation Note  Patient Details Name: William Byrd MRN: [id number] DOB: [birth year]   Cancelled treatment:       Reason Eval/Treat Not Completed: Patient not medically ready;Fatigue/lethargy limiting ability to participate(chart reviewed; consulted NSG then met w/ pt). Pt is currently lethargic and sleepy only opening eyes briefly to mod-max stim. NSG reported increased pharyngeal phlegm this morning that he was not managing independently requiring suctioning.  Pt appears at increased risk for aspiration of any oral intake at this time. Recommend continue NPO status w/ frequent oral care for hygiene and stimulation; aspiration precautions. ST services will f/u tomorrow w/ BSE if appropriate. NSG agreed.    Orinda Kenner, MS, CCC-SLP Watson,Katherine 03/17/2017, 11:14 AM

## 2017-03-17 NOTE — Progress Notes (Signed)
[facility] [place] NAME: William Byrd    MR#:  [id number]  DATE OF BIRTH:  05-13-1931  SUBJECTIVE:  CHIEF COMPLAINT:   Chief Complaint  Patient presents with  . Pneumonia   Status post extubation, still remains lethargic, opens eyes to stimuli but does not communicate much, moves limbs very slightly. Has a lot of secretions.  REVIEW OF SYSTEMS:  Review of Systems  Unable to perform ROS: Critical illness   Patient is lethargic.  DRUG ALLERGIES:   Allergies  Allergen Reactions  . Fosinopril Other (See Comments)    Other reaction(s): Other (See Comments) Hyperkalemia Reaction: Hyperkalemia     VITALS:  Blood pressure (!) 168/88, pulse (!) 108, temperature 98.9 F (37.2 C), temperature source Oral, resp. rate 16, height _0  (1.803 m), weight 84.1 kg (185 lb 6.5 oz), SpO2 95 %.  PHYSICAL EXAMINATION:  Physical Exam  GENERAL:  84 y.o.-year-old critically ill appearing patient lying in the bed, sick, lethargic EYES: Pupils equal, round, reactive to light and accommodation. No scleral icterus.Marland Kitchen  HEENT: Head atraumatic, normocephalic. Oropharynx and nasopharynx clear.  NECK:  Supple, no jugular venous distention. No thyroid enlargement, no tenderness.  LUNGS: scant breath sounds bilaterally, no wheezing, rales  or crepitation. No use of accessory muscles of respiration. Decreased bibasilar breath sounds CARDIOVASCULAR: S1, S2 normal. No rubs, or gallops. 3/6 systolic murmur present ABDOMEN: Firm and distended. Nontender. Bowel sounds present. No organomegaly or mass.  EXTREMITIES: No pedal edema, cyanosis, or clubbing.  NEUROLOGIC: off sedation, patient opens eyes to calling his name . Does not move limbs much. PSYCHIATRIC: The patient is lethargic. SKIN: No obvious rash, lesion, or ulcer.    LABORATORY PANEL:   CBC Recent Labs  Lab 03/16/17 0407  WBC 7.3  HGB 7.2*  HCT 21.3*  PLT 193    ------------------------------------------------------------------------------------------------------------------  Chemistries  Recent Labs  Lab 03/11/17 1201  03/13/17 2356  03/16/17 0407  NA 148*   < > 145   < > 147*  K 5.3*   < > 4.1   < > 3.8  CL 111   < > 117*   < > 118*  CO2 23   < > 20*   < > 21*  GLUCOSE 127*   < > 283*   < > 195*  BUN 92*   < > 70*   < > 51*  CREATININE 4.97*   < > 3.11*   < > 2.23*  CALCIUM 9.1   < > 8.0*   < > 8.7*  MG  --    < > 2.9*  --   --   AST 36  --   --   --   --   ALT 27  --   --   --   --   ALKPHOS 80  --   --   --   --   BILITOT 0.7  --   --   --   --    < > = values in this interval not displayed.   ------------------------------------------------------------------------------------------------------------------  Cardiac Enzymes Recent Labs  Lab 03/11/17 1201  TROPONINI 0.03*   ------------------------------------------------------------------------------------------------------------------  RADIOLOGY:  US Venous Img Upper Uni Left  Result Date: 03/16/2017 CLINICAL DATA:  84 year old male with a history of swelling EXAM: LEFT UPPER EXTREMITY VENOUS DOPPLER ULTRASOUND TECHNIQUE: Gray-scale sonography with graded compression, as well as color Doppler and duplex ultrasound were performed to evaluate the upper extremity deep venous  system from the level of the subclavian vein and including the jugular, axillary, basilic, radial, ulnar and upper cephalic vein. Spectral Doppler was utilized to evaluate flow at rest and with distal augmentation maneuvers. COMPARISON:  None. FINDINGS: Contralateral Subclavian Vein: Respiratory phasicity is normal and symmetric with the symptomatic side. No evidence of thrombus. Normal compressibility. Internal Jugular Vein: No evidence of thrombus. Normal compressibility, respiratory phasicity and response to augmentation. Subclavian Vein: No evidence of thrombus. Normal compressibility, respiratory phasicity  and response to augmentation. Axillary Vein: No evidence of thrombus. Normal compressibility, respiratory phasicity and response to augmentation. Cephalic Vein: No evidence of thrombus. Normal compressibility, respiratory phasicity and response to augmentation. Basilic Vein: No evidence of thrombus. Normal compressibility, respiratory phasicity and response to augmentation. Brachial Veins: Paired brachial veins are imaged. One of 2 brachial veins demonstrates occlusive thrombus. Radial Veins: No evidence of thrombus. Normal compressibility, respiratory phasicity and response to augmentation. Ulnar Veins: No evidence of thrombus. Normal compressibility, respiratory phasicity and response to augmentation. Other Findings:  None visualized. IMPRESSION: Sonographic survey of the right upper extremity is positive for occlusive DVT in 1 of 2 paired brachial veins. Electronically Signed   By: Corrie Mckusick D.O.   On: 03/16/2017 09:02   Dg Chest Port 1 View  Result Date: 03/16/2017 CLINICAL DATA:  Ventilator EXAM: PORTABLE CHEST 1 VIEW COMPARISON:  03/15/2017 FINDINGS: Endotracheal tube and NG tube are unchanged. Patchy bilateral lower lobe airspace opacities are noted, right greater than left, not significantly changed. Heart is borderline in size. No effusions or acute bony abnormality. IMPRESSION: Patchy bilateral lower lobe airspace opacities, right greater than left concerning for pneumonia. No real change. Electronically Signed   By: Rolm Baptise M.D.   On: 03/16/2017 09:36    EKG:   Orders placed or performed during the hospital encounter of 03/11/17  . ED EKG 12-Lead  . ED EKG 12-Lead  . EKG 12-Lead  . EKG 12-Lead    ASSESSMENT AND PLAN:    84 year old male with multiple medical problems including advanced dementia who is bedbound at the nursing home at baseline, CKD, congestive heart failure, diabetes mellitus, hypertension, spinal stenosis and neuropathy presents to hospital secondary to  unresponsive episode.  1.  Acute hypoxic respiratory failure-abdominal respirations on arrival.  Intubated for hypoxia and airway protection. -Continues on the ventilator.  Appreciate intensivist consult -On 40% FiO2 at this time.  Chest x-ray with bibasilar pneumonia.  Concern for aspiration. -Blood cultures are negative.  Continue cefepime at this time. -Vancomycin has been discontinued - Status post extubation, on nasal cannula oxygen now.  2.  Acute renal failure on CKD stage IV-baseline creatinine of 2.3, now much worse secondary to sepsis and prerenal azotemia -Family agree with no escalation of care and definitely no hemodialysis. -stopped fluids now. -Monitor urine output.  Renal ultrasound, avoid nephrotoxins -Slightly improved creatinine - Patient of poor oral intake and he is very likely to get dehydration and renal failure again.  3.  Sepsis-secondary to healthcare acquired pneumonia.  On ventilator, antibiotics as described above -Elevated pro-calcitonin -On low-dose pressors- improved, off.  4. Hyponatremia-free water deficit.  On D5 1/2 normal saline  5.  Acute on chronic anemia-has anemia of chronic kidney disease-no active bleeding.  Hemoglobin dropped from baseline of 9 to 7.5.  Partly dilutional and also acute illness. -Monitor closely, transfuse if hemoglobin is less than 7.  6.  DVT prophylaxis-on subcu heparin.  Hold if hemoglobin drop is significant  7. Nutrition- since abdomen is  distended, currently has an NG tube for suction. Likely has ileus. Not on any tube feeds  Now extubated, but in the past he also had significant aspiration and recurrent pneumonia issues so not a safe candidate to feed unless family agrees on comfort care. We'll give some IV fluids.  Overall poor prognosis.   I met with patient's wife and daughter in the room along with palliative care nurse and we discussed about his prognosis and possible options. They are leaning towards hospice  care but would like to watch him one more day as he is just extubated yesterday.  All the records are reviewed and case discussed with Care Management/Social Workerr. Management plans discussed with the patient, family and they are in agreement.  CODE STATUS: DNR  TOTAL TIME TAKING CARE OF THIS PATIENT: 30 minutes.   POSSIBLE D/C IN 2-3 DAYS, DEPENDING ON CLINICAL CONDITION.   Vaughan Basta M.D on 03/17/2017 at 3:29 PM  Between 7am to 6pm - Pager - [phone]  After 6pm go to [email or web address] - password [email or web address] [facility]  Office  [phone]  CC: Primary care physician; Dion Body, MD

## 2017-03-17 NOTE — Consult Note (Signed)
Consultation Note Date: 03/17/2017   Patient Name: William Byrd  DOB: 1931-10-01  MRN: [id number]  Age / Sex: 84 y.o., male  PCP: William Body, MD Referring Physician: Vaughan Byrd, *  Reason for Consultation: Establishing goals of care  HPI/Patient Profile:  William Byrd  is a 84 y.o. male with dementia.  The patient was sent from nursing home to the ED due to unresponsiveness.  Per EMR, the patient has been somnolent and non-responsive for the past 3 days.  He was noted to be febrile in the ED.  He was treated with nonrebreather mask but O2 saturation was still low.  Chest x-ray showed pneumonia.  The patient was found septic and hypotensive, treated with normal saline bolus and antibiotics in the ED.  He was intubated in the ED.     Clinical Assessment and Goals of Care: William Byrd is resting in bed. He opens his eyes to look at you, but is not verbal. His wife of 84 years and daughter are at bedside. They state he has had dementia and lived with his wife at home, until she had health problems and he was moved to a facility.   We discussed diagnosis, prognosis, GOC, EOL wishes disposition and options.  A detailed discussion was had today regarding advanced directives.  Concepts specific to code status, artifical feeding and hydration, continued IV antibiotics and rehospitalization was discussed.  The difference between an aggressive medical intervention path and a hospice comfort care path for this patient at this time was had.  Values and goals of care important to patient and family were attempted to be elicited.  His swallowing has been of concern for a while now. He has been treated several times at the facility for PNA per family. Prior to this admission, family states he was eating until last week. They noticed he was just holding food in his mouth and it was running out of his  mouth.    He was extubated yesterday, and family states they were unsure of how he would do. Family would like another day to see how he does. Plans to restart IVF for gentle hydration, robinul for secretions, and continue current level of care. Will reevaluate tomorrow for comfort care and hospice. Dr. Anselm Byrd present at bedside.       SUMMARY OF RECOMMENDATIONS    Recommend resuming gentle hydration Rubinol in place for secretions.  Continue current level of care with reevaluation tomorrow for comfort care and hospice.  Code Status/Advance Care Planning:  DNR    Symptom Management:   Robinol for excessive secretions. Morphine and Ativan in place per primary team.   Palliative Prophylaxis:   Oral Care  Additional Recommendations (Limitations, Scope, Preferences):  No feeding tube.    Prognosis:   Poor  Discharge Planning: To Be Determined      Primary Diagnoses: Present on Admission: . Septic shock ([place])   I have reviewed the medical record, interviewed the patient and family, and examined the patient. The following aspects are pertinent.  Past Medical History:  Diagnosis Date  . Arthritis   . Background retinopathy   . Cervicalgia   . Chronic kidney disease    follow up with a Dr.   . Chronic systolic CHF (congestive heart failure) ([place])   . Congenital heart failure ([place])   . Degeneration of lumbar or lumbosacral intervertebral disc   . Dementia   . Diabetes mellitus type 2, uncomplicated ([place]) 5397   on insulin since 2011, last eye exam with William Byrd 07/2013  . Diabetes mellitus without complication ([place])   . DVT (deep venous thrombosis) ([place])   . Essential hypertension    unspecified  . Generalized weakness   . Glaucoma   . Hypercholesteremia   . Hypertension   . Long-term insulin use ([place])   . Moderate dementia   . Peripheral polyneuropathy   . Peripheral vascular disease ([place])   . Pneumonia, unspecified organism 04/01/2014  . Pure  hypercholesterolemia   . Spinal stenosis   . Spinal stenosis, lumbar region without neurogenic claudication    Social History   Socioeconomic History  . Marital status: Married    Spouse name: None  . Number of children: 4  . Years of education: 13  . Highest education level: None  Social Needs  . Financial resource strain: None  . Food insecurity - worry: None  . Food insecurity - inability: None  . Transportation needs - medical: None  . Transportation needs - non-medical: None  Occupational History  . None  Tobacco Use  . Smoking status: Former Smoker    Packs/day: 0.50    Years: 46.00    Pack years: 23.00    Types: Cigarettes    Last attempt to quit: 06/11/1998    Years since quitting: 18.7  . Smokeless tobacco: Never Used  Substance and Sexual Activity  . Alcohol use: No  . Drug use: No  . Sexual activity: No    Birth control/protection: None  Other Topics Concern  . None  Social History Narrative   Admitted to [place] [facility] 04/08/2016   Married with 4 children   Former smoker   No alcohol use   Full Code   Family History  Problem Relation Age of Onset  . Diabetes Mother   . Liver cancer Father   . Liver disease Father   . Arthritis Sister    Scheduled Meds: . chlorhexidine gluconate (MEDLINE KIT)  15 mL Mouth Rinse BID  . heparin  5,000 Units Subcutaneous Q8H  . insulin aspart  0-15 Units Subcutaneous Q4H  . senna-docusate  1 tablet Per Tube BID  . sodium phosphate  1 enema Rectal Once   Continuous Infusions: . ceFEPime (MAXIPIME) IV Stopped (03/16/17 1136)  . famotidine (PEPCID) IV Stopped (03/16/17 1814)  . sodium chloride     PRN Meds:.acetaminophen **OR** acetaminophen, albuterol, bisacodyl, LORazepam, morphine injection, [DISCONTINUED] ondansetron **OR** ondansetron (ZOFRAN) IV Medications Prior to Admission:  Prior to Admission medications   Medication Sig Start Date End Date Taking? Authorizing Provider  acetaminophen (TYLENOL) 325 MG  tablet Take 650 mg by mouth every 6 (six) hours as needed for mild pain, moderate pain, fever or headache.   Yes [provider]  albuterol (PROVENTIL HFA;VENTOLIN HFA) 108 (90 Base) MCG/ACT inhaler Inhale 2 puffs into the lungs every 4 (four) hours as needed for wheezing or shortness of breath.   Yes [provider]  amLODipine (NORVASC) 10 MG tablet Take 1 tablet (10 mg total) by mouth daily. 11/15/15  Yes William Basta, MD  benzonatate (TESSALON) 100 MG capsule Take 200 mg by mouth every 8 (eight) hours as needed for cough.   Yes [provider]  bisacodyl (DULCOLAX) 5 MG EC tablet Take 1 tablet (5 mg total) by mouth daily as needed for moderate constipation. 11/14/15  Yes William Basta, MD  cefTRIAXone (ROCEPHIN) IVPB Inject 2 g into the vein once. 03/10/17  Yes [provider]  cholecalciferol (VITAMIN D) 1000 UNITS tablet Take 1,000 Units by mouth daily.   Yes [provider]  docusate sodium (STOOL SOFTENER) 100 MG capsule Take 100 mg by mouth daily as needed for mild constipation or moderate constipation.    Yes [provider]  donepezil (ARICEPT) 10 MG tablet Take 10 mg by mouth at bedtime.  07/05/14  Yes [provider]  DULoxetine (CYMBALTA) 20 MG capsule Take 20 mg by mouth daily.   Yes [provider]  finasteride (PROSCAR) 5 MG tablet Take 5 mg by mouth daily. *staff : do not handle without gloves* 07/19/14  Yes [provider]  furosemide (LASIX) 20 MG tablet Take 2 tablets (40 mg) by mouth daily each morning. Take an additional 1 tablet (20 mg) by mouth  6 hours later.   Yes [provider]  glucagon (GLUCAGON EMERGENCY) 1 MG injection Inject 1 mg into the vein once as needed.   Yes [provider]  GLUCERNA Barrie Folk) LIQD Take 1 Can by mouth daily.    Yes [provider]  HYDROcodone-acetaminophen (NORCO/VICODIN) 5-325 MG tablet Take 1 tablet by mouth every 6 (six)  hours as needed for moderate pain. 11/14/15  Yes William Basta, MD  Insulin Glargine (TOUJEO SOLOSTAR) 300 UNIT/ML SOPN Inject 60 Units into the skin every morning. 8 am    Yes [provider]  insulin regular (NOVOLIN R,HUMULIN R) 100 units/mL injection Inject 3-12 Units into the skin 4 (four) times daily -  before meals and at bedtime. If BS < 60 call NP/PA, If BS is 175 to 250 give 3 units, 251 to 325 give 6 units, 326 to 450, give 10 units, if BS > 450 give 12 units and call NP/PA, check bs ac&hs, if < 175 document 0 units    Yes [provider]  ipratropium-albuterol (DUONEB) 0.5-2.5 (3) MG/3ML SOLN Take 3 mLs by nebulization as needed.    Yes [provider]  memantine (NAMENDA XR) 28 MG CP24 24 hr capsule Take 28 mg by mouth daily.    Yes [provider]  metoprolol tartrate (LOPRESSOR) 25 MG tablet Take 1 tablet (25 mg total) by mouth 2 (two) times daily. 09/08/14  Yes Wieting, Richard, MD  OXYGEN Inhale 2 L into the lungs as needed. Check o2 sat prior to placing on patient and at least every 4 hours after applying   Yes [provider]  pantoprazole (PROTONIX) 20 MG tablet Take 20 mg by mouth daily.   Yes [provider]  pravastatin (PRAVACHOL) 10 MG tablet Take 10 mg by mouth daily.   Yes [provider]  pregabalin (LYRICA) 75 MG capsule Take 1 capsule (75 mg total) by mouth 2 (two) times daily. 10/20/16  Yes Toni Arthurs, NP  Probiotic Product (RISA-BID PROBIOTIC) TABS Take 1 tablet by mouth 2 (two) times daily.   Yes [provider]  rivaroxaban (XARELTO) 20 MG TABS tablet Take 1 tablet by mouth daily.  07/18/14  Yes [provider]  sacubitril-valsartan (ENTRESTO) 49-51 MG Take 1 tablet by  mouth 2 (two) times daily.   Yes [provider]  senna (SENOKOT) 8.6 MG TABS tablet Take 1 tablet (8.6 mg total) by mouth daily. 11/14/15  Yes William Basta, MD  tamsulosin (FLOMAX) 0.4 MG CAPS  capsule Take 0.4 mg by mouth at bedtime.   Yes [provider]  timolol (TIMOPTIC) 0.5 % ophthalmic solution Place 1 drop into both eyes 2 (two) times daily.    Yes [provider]  Wound Dressings (TRIAD HYDROPHILIC WOUND DRESSI) PSTE Apply cream topically to macerated areas of buttocks daily and prn   Yes [provider]   Allergies  Allergen Reactions  . Fosinopril Other (See Comments)    Other reaction(s): Other (See Comments) Hyperkalemia Reaction: Hyperkalemia    Review of Systems  Unable to perform ROS   Physical Exam  Constitutional: No distress.  Pulmonary/Chest: Effort normal.  Neurological:  Opens eyes intermittently, but non-verbal.   Skin: Skin is warm and dry.    Vital Signs: BP (!) 168/88 (BP Location: Left Arm)   Pulse (!) 108   Temp 98.9 F (37.2 C) (Oral)   Resp 16   Ht _0  (1.803 m)   Wt 84.1 kg (185 lb 6.5 oz)   SpO2 95%   BMI 25.86 kg/m  Pain Assessment: PAINAD POSS *See Group Information*: 2-Acceptable,Slightly drowsy, easily aroused     SpO2: SpO2: 95 % O2 Device:SpO2: 95 % O2 Flow Rate: .O2 Flow Rate (L/min): 2 L/min  IO: Intake/output summary:   Intake/Output Summary (Last 24 hours) at 03/17/2017 1414 Last data filed at 03/17/2017 1334 Gross per 24 hour  Intake 50 ml  Output 1465 ml  Net -1415 ml    LBM: Last BM Date: 03/15/17 Baseline Weight: Weight: 79.8 kg (176 lb) Most recent weight: Weight: 84.1 kg (185 lb 6.5 oz)     Palliative Assessment/Data: 10%   Flowsheet Rows     Most Recent Value  Intake Tab  Referral Department  Hospitalist  Unit at Time of Referral  ICU  Date Notified  03/11/17  Palliative Care Type  Not seen  Reason Not Seen  Consult cancelled  Reason for referral  Clarify Goals of Care  Date of Admission  03/11/17  # of days IP prior to Palliative referral  0  Clinical Assessment  Psychosocial & Spiritual Assessment  Palliative Care Outcomes      Time In: 1:20 Time Out:  2:30 Time Total: 70 min Greater than 50%  of this time was spent counseling and coordinating care related to the above assessment and plan.  Signed by: Asencion Gowda, NP   Please contact Palliative Medicine Team phone at [phone] for questions and concerns.  For individual provider: See Shea Evans

## 2017-03-18 DIAGNOSIS — R609 Edema, unspecified: Secondary | ICD-10-CM

## 2017-03-18 DIAGNOSIS — A419 Sepsis, unspecified organism: Principal | ICD-10-CM

## 2017-03-18 DIAGNOSIS — R6521 Severe sepsis with septic shock: Secondary | ICD-10-CM

## 2017-03-18 LAB — RENAL FUNCTION PANEL
Albumin: 2.4 g/dL — ABNORMAL LOW (ref 3.5–5.0)
Anion gap: 11 (ref 5–15)
BUN: 47 mg/dL — ABNORMAL HIGH (ref 6–20)
CHLORIDE: 123 mmol/L — AB (ref 101–111)
CO2: 19 mmol/L — ABNORMAL LOW (ref 22–32)
CREATININE: 2.06 mg/dL — AB (ref 0.61–1.24)
Calcium: 8.9 mg/dL (ref 8.9–10.3)
GFR, EST AFRICAN AMERICAN: 33 mL/min — AB (ref >60)
GFR, EST NON AFRICAN AMERICAN: 28 mL/min — AB (ref >60)
Glucose, Bld: 122 mg/dL — ABNORMAL HIGH (ref 65–99)
PHOSPHORUS: 2.8 mg/dL (ref 2.5–4.6)
Potassium: 4.3 mmol/L (ref 3.5–5.1)
Sodium: 153 mmol/L — ABNORMAL HIGH (ref 135–145)

## 2017-03-18 LAB — GLUCOSE, CAPILLARY
GLUCOSE-CAPILLARY: 105 mg/dL — AB (ref 65–99)
GLUCOSE-CAPILLARY: 143 mg/dL — AB (ref 65–99)
Glucose-Capillary: 166 mg/dL — ABNORMAL HIGH (ref 65–99)

## 2017-03-18 MED ORDER — MORPHINE SULFATE (CONCENTRATE) 10 MG/0.5ML PO SOLN: 10.0000 mg | ORAL | 0 refills | Status: DC | PRN

## 2017-03-18 MED ORDER — DEXTROSE 5 % IV SOLN
INTRAVENOUS | Status: DC
  Administered 2017-03-18: 09:00:00 via INTRAVENOUS

## 2017-03-18 NOTE — Plan of Care (Signed)
Pt now on comfort care.  No signs of pain nor discomfort. Continue foley for end of life care.

## 2017-03-18 NOTE — Plan of Care (Signed)
  Education: Knowledge of General Education information will improve 03/18/2017 0350 - Not Progressing by Geri SeminoleJackson, Mishayla Sliwinski A, RN   Health Behavior/Discharge Planning: Ability to manage health-related needs will improve 03/18/2017 0350 - Not Progressing by Geri SeminoleJackson, Tylyn Derwin A, RN   Clinical Measurements: Ability to maintain clinical measurements within normal limits will improve 03/18/2017 0350 - Progressing by Geri SeminoleJackson, Kennedy Bohanon A, RN Will remain free from infection 03/18/2017 0350 - Progressing by Geri SeminoleJackson, Richardo Popoff A, RN Diagnostic test results will improve 03/18/2017 0350 - Progressing by Geri SeminoleJackson, Mattie Nordell A, RN Respiratory complications will improve 03/18/2017 0350 - Progressing by Geri SeminoleJackson, Suzzane Quilter A, RN Cardiovascular complication will be avoided 03/18/2017 0350 - Not Progressing by Geri SeminoleJackson, Lynetta Tomczak A, RN   Activity: Risk for activity intolerance will decrease 03/18/2017 0350 - Not Progressing by Geri SeminoleJackson, Saiya Crist A, RN   Nutrition: Adequate nutrition will be maintained 03/18/2017 0350 - Not Progressing by Geri SeminoleJackson, Tabor Denham A, RN   Coping: Level of anxiety will decrease 03/18/2017 0350 - Progressing by Geri SeminoleJackson, Michaeljohn Biss A, RN   Elimination: Will not experience complications related to bowel motility 03/18/2017 0350 - Progressing by Geri SeminoleJackson, Inez Rosato A, RN Will not experience complications related to urinary retention 03/18/2017 0350 - Progressing by Geri SeminoleJackson, Saleh Ulbrich A, RN   Pain Managment: General experience of comfort will improve 03/18/2017 0350 - Progressing by Geri SeminoleJackson, Askari Kinley A, RN   Safety: Ability to remain free from injury will improve 03/18/2017 0350 - Progressing by Geri SeminoleJackson, Nayel Purdy A, RN   Skin Integrity: Risk for impaired skin integrity will decrease 03/18/2017 0350 - Progressing by Geri SeminoleJackson, Jizel Cheeks A, RN   Activity: Ability to tolerate increased activity will improve 03/18/2017 0350 - Not Progressing by Geri SeminoleJackson, Arihant Pennings A, RN

## 2017-03-18 NOTE — Progress Notes (Signed)
[place] [facility] [place] NAME: William Byrd    MR#:  [id number]  DATE OF BIRTH:  August 02, 1931  SUBJECTIVE:  CHIEF COMPLAINT:   Chief Complaint  Patient presents with  . Pneumonia   Status post extubation, still remains lethargic, opens eyes to stimuli but does not communicate much, moves limbs very slightly. Has a lot of secretions.  REVIEW OF SYSTEMS:  Review of Systems  Unable to perform ROS: Critical illness   Patient is lethargic.  DRUG ALLERGIES:   Allergies  Allergen Reactions  . Fosinopril Other (See Comments)    Other reaction(s): Other (See Comments) Hyperkalemia Reaction: Hyperkalemia     VITALS:  Blood pressure (!) 146/60, pulse 86, temperature 98.8 F (37.1 C), temperature source Oral, resp. rate 16, height 5' 11"  (1.803 m), weight 84.1 kg (185 lb 6.5 oz), SpO2 94 %.  PHYSICAL EXAMINATION:  Physical Exam  GENERAL:  84 y.o.-year-old critically ill appearing patient lying in the bed, sick, lethargic EYES: Pupils equal, round, reactive to light and accommodation. No scleral icterus.Marland Kitchen  HEENT: Head atraumatic, normocephalic. Oropharynx and nasopharynx clear.  NECK:  Supple, no jugular venous distention. No thyroid enlargement, no tenderness.  LUNGS: scant breath sounds bilaterally, no wheezing, rales  or crepitation. No use of accessory muscles of respiration. Decreased bibasilar breath sounds CARDIOVASCULAR: S1, S2 normal. No rubs, or gallops. 3/6 systolic murmur present ABDOMEN: Firm and distended. Nontender. Bowel sounds present. No organomegaly or mass.  EXTREMITIES: No pedal edema, cyanosis, or clubbing.  NEUROLOGIC: off sedation, patient opens eyes to calling his name . Does not move limbs much. PSYCHIATRIC: The patient is lethargic. SKIN: No obvious rash, lesion, or ulcer.    LABORATORY PANEL:   CBC Recent Labs  Lab 03/16/17 0407  WBC 7.3  HGB 7.2*  HCT 21.3*  PLT 193    ------------------------------------------------------------------------------------------------------------------  Chemistries  Recent Labs  Lab 03/13/17 2356  03/18/17 0435  NA 145   < > 153*  K 4.1   < > 4.3  CL 117*   < > 123*  CO2 20*   < > 19*  GLUCOSE 283*   < > 122*  BUN 70*   < > 47*  CREATININE 3.11*   < > 2.06*  CALCIUM 8.0*   < > 8.9  MG 2.9*  --   --    < > = values in this interval not displayed.   ------------------------------------------------------------------------------------------------------------------  Cardiac Enzymes No results for input(s): TROPONINI in the last 168 hours. ------------------------------------------------------------------------------------------------------------------  RADIOLOGY:  No results found.  EKG:   Orders placed or performed during the hospital encounter of 03/11/17  . ED EKG 12-Lead  . ED EKG 12-Lead  . EKG 12-Lead  . EKG 12-Lead    ASSESSMENT AND PLAN:    84 year old male with multiple medical problems including advanced dementia who is bedbound at the nursing home at baseline, CKD, congestive heart failure, diabetes mellitus, hypertension, spinal stenosis and neuropathy presents to hospital secondary to unresponsive episode.  1.  Acute hypoxic respiratory failure-abdominal respirations on arrival.  Intubated for hypoxia and airway protection. -Continues on the ventilator.  Appreciate intensivist consult -On 40% FiO2 at this time.  Chest x-ray with bibasilar pneumonia.  Concern for aspiration. -Blood cultures are negative.  Continue cefepime at this time. -Vancomycin has been discontinued - Status post extubation, on nasal cannula oxygen now.  2.  Acute renal failure on CKD stage IV-baseline creatinine of 2.3, now much worse secondary to  sepsis and prerenal azotemia -Family agree with no escalation of care and definitely no hemodialysis. -stopped fluids now. -Monitor urine output.  Renal ultrasound, avoid  nephrotoxins -Slightly improved creatinine - Patient of poor oral intake and he is very likely to get dehydration and renal failure again.  3.  Sepsis-secondary to healthcare acquired pneumonia.  On ventilator, antibiotics as described above -Elevated pro-calcitonin -On low-dose pressors- improved, off.  4. Hyponatremia-free water deficit.  On D5 1/2 normal saline   Now have hypernatremia- started on D5w now, monitor.  5.  Acute on chronic anemia-has anemia of chronic kidney disease-no active bleeding.  Hemoglobin dropped from baseline of 9 to 7.5.  Partly dilutional and also acute illness. -Monitor closely, transfuse if hemoglobin is less than 7.  6.  DVT prophylaxis-on subcu heparin.  Hold if hemoglobin drop is significant  7. Nutrition- since abdomen is distended, currently has an NG tube for suction. Likely has ileus. Not on any tube feeds  Now extubated, but in the past he also had significant aspiration and recurrent pneumonia issues so not a safe candidate to feed unless family agrees on comfort care. We'll give some IV fluids.  Overall poor prognosis.   I met with patient's wife and daughter in the room along with palliative care nurse and we discussed about his prognosis and possible options. They are leaning towards hospice care but would like to watch him one more day as he is just extubated yesterday. Awaited meeting with palliative care.  All the records are reviewed and case discussed with Care Management/Social Workerr. Management plans discussed with the patient, family and they are in agreement.  CODE STATUS: DNR  TOTAL TIME TAKING CARE OF THIS PATIENT: 30 minutes.   POSSIBLE D/C IN 2-3 DAYS, DEPENDING ON CLINICAL CONDITION.   Vaughan Basta M.D on 03/18/2017 at 1:30 PM  Between 7am to 6pm - Pager - [phone]  After 6pm go to [email or web address] - password [email or web address] [facility]  Office  [phone]  CC: Primary care physician;  Dion Body, MD

## 2017-03-18 NOTE — Discharge Summary (Signed)
[place] [place] NAME: William Byrd    MR#:  [id number]  DATE OF BIRTH:  1931/10/16  DATE OF ADMISSION:  03/11/2017 ADMITTING PHYSICIAN: Demetrios Loll, MD  DATE OF DISCHARGE: 03/18/2017   PRIMARY CARE PHYSICIAN: Dion Body, MD    ADMISSION DIAGNOSIS:  HCAP (healthcare-associated pneumonia) [J18.9] Sepsis, due to unspecified organism ([place]) [A41.9]  DISCHARGE DIAGNOSIS:  Active Problems:   Septic shock ([place])   Pressure injury of skin   SECONDARY DIAGNOSIS:   Past Medical History:  Diagnosis Date  . Arthritis   . Background retinopathy   . Cervicalgia   . Chronic kidney disease    follow up with a Dr.   . Chronic systolic CHF (congestive heart failure) ([place])   . Congenital heart failure ([place])   . Degeneration of lumbar or lumbosacral intervertebral disc   . Dementia   . Diabetes mellitus type 2, uncomplicated ([place]) 5681   on insulin since 2011, last eye exam with Dr. Wallace Going 07/2013  . Diabetes mellitus without complication ([place])   . DVT (deep venous thrombosis) ([place])   . Essential hypertension    unspecified  . Generalized weakness   . Glaucoma   . Hypercholesteremia   . Hypertension   . Long-term insulin use ([place])   . Moderate dementia   . Peripheral polyneuropathy   . Peripheral vascular disease ([place])   . Pneumonia, unspecified organism 04/01/2014  . Pure hypercholesterolemia   . Spinal stenosis   . Spinal stenosis, lumbar region without neurogenic claudication     HOSPITAL COURSE:   84 year old male with multiple medical problems including advanced dementia who is bedbound at the nursing home at baseline, CKD, congestive heart failure, diabetes mellitus, hypertension, spinal stenosis and neuropathy presents to hospital secondary to unresponsive episode.  1.  Acute hypoxic respiratory failure-abdominal respirations on arrival.  Intubated for hypoxia and airway protection. -Continues on the  ventilator.  Appreciate intensivist consult -On 40% FiO2 at this time.  Chest x-ray with bibasilar pneumonia.  Concern for aspiration. -Blood cultures are negative.  Continue cefepime at this time. -Vancomycin has been discontinued - Status post extubation, on nasal cannula oxygen now.  2.  Acute renal failure on CKD stage IV-baseline creatinine of 2.3, now much worse secondary to sepsis and prerenal azotemia -Family agree with no escalation of care and definitely no hemodialysis. -stopped fluids now. -Monitor urine output.  Renal ultrasound, avoid nephrotoxins -Slightly improved creatinine - Patient of poor oral intake and he is very likely to get dehydration and renal failure again.  3.  Sepsis-secondary to healthcare acquired pneumonia.  On ventilator, antibiotics as described above -Elevated pro-calcitonin -On low-dose pressors- improved, off.  4. Hyponatremia-free water deficit.  On D5 1/2 normal saline   Now have hypernatremia- started on D5w now, monitor.  5.  Acute on chronic anemia-has anemia of chronic kidney disease-no active bleeding.  Hemoglobin dropped from baseline of 9 to 7.5.  Partly dilutional and also acute illness. -Monitor closely, transfuse if hemoglobin is less than 7.  6.  DVT prophylaxis-on subcu heparin.  Hold if hemoglobin drop is significant  7. Nutrition- since abdomen is distended, currently has an NG tube for suction. Likely has ileus. Not on any tube feeds  Now extubated, but in the past he also had significant aspiration and recurrent pneumonia issues so not a safe candidate to feed unless family agrees on comfort care. We'll give some IV fluids.  Overall poor prognosis.   I  met with patient's wife and daughter in the room along with palliative care nurse and we discussed about his prognosis and possible options. They are leaning towards hospice care but would like to watch him one more day as he is just extubated yesterday. Awaited meeting with  palliative care.  Family agreed on [facility] at [facility]. D/c today.  DISCHARGE CONDITIONS:  Fair.  CONSULTS OBTAINED:  Treatment Team:  Lavonia Dana, MD Wilhelmina Mcardle, MD  DRUG ALLERGIES:   Allergies  Allergen Reactions  . Fosinopril Other (See Comments)    Other reaction(s): Other (See Comments) Hyperkalemia Reaction: Hyperkalemia     DISCHARGE MEDICATIONS:   Allergies as of 03/18/2017      Reactions   Fosinopril Other (See Comments)   Other reaction(s): Other (See Comments) Hyperkalemia Reaction: Hyperkalemia       Medication List    STOP taking these medications   albuterol 108 (90 Base) MCG/ACT inhaler Commonly known as:  PROVENTIL HFA;VENTOLIN HFA   amLODipine 10 MG tablet Commonly known as:  NORVASC   benzonatate 100 MG capsule Commonly known as:  TESSALON   cefTRIAXone IVPB Commonly known as:  ROCEPHIN   cholecalciferol 1000 units tablet Commonly known as:  VITAMIN D   donepezil 10 MG tablet Commonly known as:  ARICEPT   DULoxetine 20 MG capsule Commonly known as:  CYMBALTA   ENTRESTO 49-51 MG Generic drug:  sacubitril-valsartan   finasteride 5 MG tablet Commonly known as:  PROSCAR   furosemide 20 MG tablet Commonly known as:  LASIX   GLUCAGON EMERGENCY 1 MG injection Generic drug:  glucagon   GLUCERNA Liqd   HYDROcodone-acetaminophen 5-325 MG tablet Commonly known as:  NORCO/VICODIN   insulin regular 100 units/mL injection Commonly known as:  NOVOLIN R,HUMULIN R   ipratropium-albuterol 0.5-2.5 (3) MG/3ML Soln Commonly known as:  DUONEB   metoprolol tartrate 25 MG tablet Commonly known as:  LOPRESSOR   NAMENDA XR 28 MG Cp24 24 hr capsule Generic drug:  memantine   pantoprazole 20 MG tablet Commonly known as:  PROTONIX   pravastatin 10 MG tablet Commonly known as:  PRAVACHOL   pregabalin 75 MG capsule Commonly known as:  LYRICA   RISA-BID PROBIOTIC Tabs   senna 8.6 MG Tabs tablet Commonly known as:  SENOKOT    STOOL SOFTENER 100 MG capsule Generic drug:  docusate sodium   tamsulosin 0.4 MG Caps capsule Commonly known as:  FLOMAX   timolol 0.5 % ophthalmic solution Commonly known as:  TIMOPTIC   TOUJEO SOLOSTAR 300 UNIT/ML Sopn Generic drug:  Insulin Glargine   Triad Hydrophilic Wound Dressi Pste   XARELTO 20 MG Tabs tablet Generic drug:  rivaroxaban     TAKE these medications   acetaminophen 325 MG tablet Commonly known as:  TYLENOL Take 650 mg by mouth every 6 (six) hours as needed for mild pain, moderate pain, fever or headache.   bisacodyl 5 MG EC tablet Commonly known as:  DULCOLAX Take 1 tablet (5 mg total) by mouth daily as needed for moderate constipation.   morphine CONCENTRATE 10 MG/0.5ML Soln concentrated solution Take 0.5 mLs (10 mg total) by mouth every 2 (two) hours as needed for severe pain or anxiety.   OXYGEN Inhale 2 L into the lungs as needed. Check o2 sat prior to placing on patient and at least every 4 hours after applying        DISCHARGE INSTRUCTIONS:    Comfort measures at [facility].  If you experience worsening of  your admission symptoms, develop shortness of breath, life threatening emergency, suicidal or homicidal thoughts you must seek medical attention immediately by calling 911 or calling your MD immediately  if symptoms less severe.  You Must read complete instructions/literature along with all the possible adverse reactions/side effects for all the Medicines you take and that have been prescribed to you. Take any new Medicines after you have completely understood and accept all the possible adverse reactions/side effects.   Please note  You were cared for by a hospitalist during your hospital stay. If you have any questions about your discharge medications or the care you received while you were in the hospital after you are discharged, you can call the unit and asked to speak with the hospitalist on call if the hospitalist that took care of you is  not available. Once you are discharged, your primary care physician will handle any further medical issues. Please note that NO REFILLS for any discharge medications will be authorized once you are discharged, as it is imperative that you return to your primary care physician (or establish a relationship with a primary care physician if you do not have one) for your aftercare needs so that they can reassess your need for medications and monitor your lab values.    Today   CHIEF COMPLAINT:   Chief Complaint  Patient presents with  . Pneumonia    HISTORY OF PRESENT ILLNESS:  William Byrd  is a 84 y.o. male with a known history of multiple medical planes as below.  The patient was sent from nursing home to the ED due to unresponsiveness.  Per ED physician, the patient has been somnolent and non-responsive for the past 3 days.  He has fever 101 in the ED.  He was treated with nonrebreather mask but O2 saturation was still low.  Chest x-ray showed pneumonia.  The patient was found septic and hypotension, treated with normal saline bolus and antibiotics in the ED.  He was intubated in the ED.  The patient has history of multiple times of pneumonia recently, treated with antibiotics.      VITAL SIGNS:  Blood pressure (!) 146/60, pulse 86, temperature 98.8 F (37.1 C), temperature source Oral, resp. rate 16, height 5' 11"  (1.803 m), weight 84.1 kg (185 lb 6.5 oz), SpO2 94 %.  I/O:    Intake/Output Summary (Last 24 hours) at 03/18/2017 1643 Last data filed at 03/18/2017 0515 Gross per 24 hour  Intake 922.5 ml  Output 1700 ml  Net -777.5 ml    PHYSICAL EXAMINATION:   GENERAL:  84 y.o.-year-old critically ill appearing patient lying in the bed, sick, lethargic EYES: Pupils equal, round, reactive to light and accommodation. No scleral icterus.Marland Kitchen  HEENT: Head atraumatic, normocephalic. Oropharynx and nasopharynx clear.  NECK:  Supple, no jugular venous distention. No thyroid enlargement, no  tenderness.  LUNGS: scant breath sounds bilaterally, no wheezing, rales  or crepitation. No use of accessory muscles of respiration. Decreased bibasilar breath sounds CARDIOVASCULAR: S1, S2 normal. No rubs, or gallops. 3/6 systolic murmur present ABDOMEN: Firm and distended. Nontender. Bowel sounds present. No organomegaly or mass.  EXTREMITIES: No pedal edema, cyanosis, or clubbing.  NEUROLOGIC: off sedation, patient opens eyes to calling his name . Does not move limbs much. PSYCHIATRIC: The patient is lethargic. SKIN: No obvious rash, lesion, or ulcer.     DATA REVIEW:   CBC Recent Labs  Lab 03/16/17 0407  WBC 7.3  HGB 7.2*  HCT 21.3*  PLT 193  Chemistries  Recent Labs  Lab 03/13/17 2356  03/18/17 0435  NA 145   < > 153*  K 4.1   < > 4.3  CL 117*   < > 123*  CO2 20*   < > 19*  GLUCOSE 283*   < > 122*  BUN 70*   < > 47*  CREATININE 3.11*   < > 2.06*  CALCIUM 8.0*   < > 8.9  MG 2.9*  --   --    < > = values in this interval not displayed.    Cardiac Enzymes No results for input(s): TROPONINI in the last 168 hours.  Microbiology Results  Results for orders placed or performed during the hospital encounter of 03/11/17  Blood Culture (routine x 2)     Status: None   Collection Time: 03/11/17 12:01 PM  Result Value Ref Range Status   Specimen Description BLOOD LEFT WRIST  Final   Special Requests   Final    BOTTLES DRAWN AEROBIC AND ANAEROBIC Blood Culture adequate volume   Culture   Final    NO GROWTH 5 DAYS Performed at [facility], [address], [place], [place] [address]    Report Status 03/16/2017 FINAL  Final  Urine culture     Status: None   Collection Time: 03/11/17 12:36 PM  Result Value Ref Range Status   Specimen Description   Final    URINE, RANDOM Performed at [facility], [address], [place], [place] [address]    Special Requests   Final    NONE Performed at [facility], [address]  [place], [place] [address]    Culture   Final    NO GROWTH Performed at [facility], [place] [address], [place], [place] [address]    Report Status 03/13/2017 FINAL  Final  Culture, respiratory (NON-Expectorated)     Status: None   Collection Time: 03/11/17  3:51 PM  Result Value Ref Range Status   Specimen Description   Final    TRACHEAL ASPIRATE Performed at [facility], [address], [place], [place] [address]    Special Requests   Final    NONE Performed at [facility], [address] [place], [place] [address]    Gram Stain   Final    FEW WBC PRESENT, PREDOMINANTLY PMN FEW YEAST Performed at [facility], [place] [address] [place], [place] [address]    Culture MODERATE CANDIDA ALBICANS  Final   Report Status 03/14/2017 FINAL  Final  MRSA PCR Screening     Status: None   Collection Time: 03/11/17  4:41 PM  Result Value Ref Range Status   MRSA by PCR NEGATIVE NEGATIVE Final    Comment:        The GeneXpert MRSA Assay (FDA approved for NASAL specimens only), is one component of a comprehensive MRSA colonization surveillance program. It is not intended to diagnose MRSA infection nor to guide or monitor treatment for MRSA infections. Performed at [facility], [address], [place],  [address]     RADIOLOGY:  No results found.  EKG:   Orders placed or performed during the hospital encounter of 03/11/17  . ED EKG 12-Lead  . ED EKG 12-Lead  . EKG 12-Lead  . EKG 12-Lead      Management plans discussed with the patient, family and they are in agreement.  CODE STATUS:     Code Status Orders  (From  admission, onward)        Start     Ordered   03/11/17 1736  Do not attempt resuscitation (DNR)  Continuous    Question Answer Comment  In the event of cardiac or respiratory ARREST Do not call a "code blue"   In the event of cardiac or respiratory ARREST Do not perform Intubation, CPR, defibrillation or ACLS    In the event of cardiac or respiratory ARREST Use medication by any route, position, wound care, and other measures to relive pain and suffering. May use oxygen, suction and manual treatment of airway obstruction as needed for comfort.      03/11/17 1736    Code Status History    Date Active Date Inactive Code Status Order ID Comments User Context   03/11/2017 15:21 03/11/2017 17:36 Full Code [id number]  Demetrios Loll, MD Inpatient   04/05/2016 22:05 04/08/2016 19:51 Full Code [id number]  Lance Coon, MD Inpatient   11/10/2015 00:25 11/15/2015 18:25 Full Code [id number]  Harvie Bridge, DO Inpatient   10/17/2015 20:18 10/19/2015 15:09 Full Code [id number]  Henreitta Leber, MD Inpatient   07/15/2015 08:30 07/18/2015 21:00 Full Code [id number]  Dustin Flock, MD ED   12/28/2014 21:50 12/31/2014 16:38 Full Code [id number]  Vaughan Basta, MD Inpatient   09/05/2014 04:09 09/08/2014 18:11 Full Code [id number]  Juluis Mire, MD ED    Advance Directive Documentation     Most Recent Value  Type of Advance Directive  Healthcare Power of Attorney  Pre-existing out of facility DNR order (yellow form or pink MOST form)  No data  "MOST" Form in Place?  No data      TOTAL TIME TAKING CARE OF THIS PATIENT: 35 minutes.    Vaughan Basta M.D on 03/18/2017 at 4:43 PM  Between 7am to 6pm - Pager - [phone]  After 6pm go to [email or web address] - password [email or web address] [facility]  Office  [phone]  CC: Primary care physician; Dion Body, MD   Note: This dictation was prepared with Dragon dictation along with smaller phrase technology. Any transcriptional errors that result from this process are unintentional.

## 2017-03-18 NOTE — Progress Notes (Signed)
[facility], [place] 03/18/17  Subjective:   Patient opened eyes to sound and able to follow simple commands, such as to open his mouth and stick out his tongue. He fatigues easily and is still unable to speak.   Patient was extubated 03/16/17. Now on 2-3 L of [place]    Objective:  Vital signs in last 24 hours:  Temp:  [98.6 F (37 C)-98.8 F (37.1 C)] 98.8 F (37.1 C) (02/06 0430) Pulse Rate:  [82-86] 86 (02/06 0430) Resp:  [16] 16 (02/06 0430) BP: (146-158)/(60-65) 146/60 (02/06 0430) SpO2:  [94 %-99 %] 94 % (02/06 0430)  Weight change:  Filed Weights   03/11/17 1225 03/11/17 1600  Weight: 79.8 kg (176 lb) 84.1 kg (185 lb 6.5 oz)    Intake/Output:    Intake/Output Summary (Last 24 hours) at 03/18/2017 1116 Last data filed at 03/18/2017 0515 Gross per 24 hour  Intake 1072.5 ml  Output 2500 ml  Net -1427.5 ml     Physical Exam: General:  Critically ill-appearing. Lying in bed  HEENT  dry oral mucous membranes  Neck  no distended neck veins  Pulm/lungs  coarse breath sounds at bases, nasal cannula oxygen  CVS/Heart  regular rhythm  Abdomen:    Distended, BS present  Extremities: B/l lower extremity edema. General edema noted.  Neurologic:  Lethargic but still able to respond to sound  Skin: No acute rashes   Foley in place       Basic Metabolic Panel:  Recent Labs  Lab 03/13/17 0501 03/13/17 2356 03/14/17 0348 03/15/17 0651 03/16/17 0407 03/18/17 0435  NA 152* 145 146* 143 147* 153*  K 4.1 4.1 3.9 3.8 3.8 4.3  CL 117* 117* 117* 113* 118* 123*  CO2 21* 20* 19* 20* 21* 19*  GLUCOSE 199* 283* 230* 201* 195* 122*  BUN 72* 70* 65* 55* 51* 47*  CREATININE 3.21* 3.11* 2.99* 2.65* 2.23* 2.06*  CALCIUM 8.5* 8.0* 8.0* 8.5* 8.7* 8.9  MG 3.0* 2.9*  --   --   --   --   PHOS 3.2 2.8  --  3.0 2.8 2.8     CBC: Recent Labs  Lab 03/11/17 1201 03/12/17 0756 03/13/17 0501 03/14/17 0348 03/15/17 0651 03/16/17 0407  WBC 8.9 12.0* 10.4  8.7 6.5 7.3  NEUTROABS 7.1*  --   --   --   --   --   HGB 8.9* 7.5* 7.6* 7.2* 7.4* 7.2*  HCT 26.9* 23.5* 23.4* 21.5* 22.4* 21.3*  MCV 91.4 91.8 92.1 91.5 90.0 90.2  PLT 285 194 183 179 190 193     No results found for: HEPBSAG, HEPBSAB, HEPBIGM    Microbiology:  Recent Results (from the past 240 hour(s))  Blood Culture (routine x 2)     Status: None   Collection Time: 03/11/17 12:01 PM  Result Value Ref Range Status   Specimen Description BLOOD LEFT WRIST  Final   Special Requests   Final    BOTTLES DRAWN AEROBIC AND ANAEROBIC Blood Culture adequate volume   Culture   Final    NO GROWTH 5 DAYS Performed at [facility], [address], [place], [place] [address]    Report Status 03/16/2017 FINAL  Final  Urine culture     Status: None   Collection Time: 03/11/17 12:36 PM  Result Value Ref Range Status   Specimen Description   Final    URINE, RANDOM Performed at [facility], [address] [place],  [place] [address]    Special Requests   Final    NONE Performed at [facility], [address] [place], [place] [address]    Culture   Final    NO GROWTH Performed at [facility], [place] [address], [place], [place] [address]    Report Status 03/13/2017 FINAL  Final  Culture, respiratory (NON-Expectorated)     Status: None   Collection Time: 03/11/17  3:51 PM  Result Value Ref Range Status   Specimen Description   Final    TRACHEAL ASPIRATE Performed at [facility], [address] [place], [place] [address]    Special Requests   Final    NONE Performed at [facility], [address] [place], [place] [address]    Gram Stain   Final    FEW WBC PRESENT, PREDOMINANTLY PMN FEW YEAST Performed at [facility], [place] [address], [place], [place] [address]    Culture MODERATE CANDIDA ALBICANS  Final   Report Status 03/14/2017 FINAL  Final  MRSA PCR Screening     Status: None   Collection Time: 03/11/17   4:41 PM  Result Value Ref Range Status   MRSA by PCR NEGATIVE NEGATIVE Final    Comment:        The GeneXpert MRSA Assay (FDA approved for NASAL specimens only), is one component of a comprehensive MRSA colonization surveillance program. It is not intended to diagnose MRSA infection nor to guide or monitor treatment for MRSA infections. Performed at [facility], [address] [place], [place] [address]     Coagulation Studies: No results for input(s): LABPROT, INR in the last 72 hours.  Urinalysis: No results for input(s): COLORURINE, LABSPEC, PHURINE, GLUCOSEU, HGBUR, BILIRUBINUR, KETONESUR, PROTEINUR, UROBILINOGEN, NITRITE, LEUKOCYTESUR in the last 72 hours.  Invalid input(s): APPERANCEUR    Imaging: No results found.   Medications:   . ceFEPime (MAXIPIME) IV Stopped (03/17/17 1651)  . dextrose 100 mL/hr at 03/18/17 0857  . famotidine (PEPCID) IV Stopped (03/17/17 2015)  . sodium chloride     . chlorhexidine gluconate (MEDLINE KIT)  15 mL Mouth Rinse BID  . heparin  5,000 Units Subcutaneous Q8H  . insulin aspart  0-15 Units Subcutaneous Q4H  . senna-docusate  1 tablet Per Tube BID  . sodium phosphate  1 enema Rectal Once   acetaminophen **OR** acetaminophen, albuterol, bisacodyl, glycopyrrolate, labetalol, LORazepam, morphine injection, [DISCONTINUED] ondansetron **OR** ondansetron (ZOFRAN) IV  Assessment/ Plan:  84 y.o. african [place]  male  with diabetes mellitus type II, hypertension, dementia, diabetic neuropathy, coronary artery disease, CVA.   1.   Acute renal failure - Continue to monitor urine output and serum creatinine  2.  Chronic kidney disease stage IV, baseline creatinine 2.39, GFR 27 on November 14, 2016 - Cr 2.23 (2/5) --> 2.06 (2/6)  3.  Generalized edema - No diuretics at present d/t renal failure - Edema slowly improving and remains mostly apparent in b/l upper extremities  4.  Hypernatremia - Na:147 (2/5) --> 153  (2/6) -  Continue D5 at 174m/hr  - Continue to monitor sodium with BMP  5.  Acute respiratory failure requiring mechanical ventilation, extubated 03/16/17. - Currently on [place]      LOS: 7[place]2/6/201911:16 AM  [facility] [place], [place]

## 2017-03-18 NOTE — Progress Notes (Signed)
New referral for [facility] services at [place] received from [place] following a Palliative Medicine consult .  Patient is an 84 year old man with a known history of dementia, CKD stage IV, CHF, DM II, PVD and HTN admitted to [facility] on 1/30 for evaluation of unresponsiveness. In the ED he was found to have pneumonia and decreased oxygen saturations, requiring intubation. He was extubated on 2/4. Palliative Medicine was consulted for goals of care and symptom management and have met with family. Patient remains somnolent and is receiving IV Robinul for secretion management. Plan is for discharge back to [facility] today via EMS. Signed DNR in place in discharge packet. No family at bedside. Patient information faxed to referral. Flo Shanks RN, BSN, [facility] [place], hospital Liaison [phone]

## 2017-03-18 NOTE — Discharge Instructions (Addendum)
Hospice care at SNF.

## 2017-03-18 NOTE — Clinical Social Work Note (Signed)
Clinical Social Work Assessment  Patient Details  Name: William Byrd MRN: [id number] Date of Birth: 06/20/1931  Date of referral:  03/18/17               Reason for consult:  Facility Placement                Permission sought to share information with:  Facility Medical sales representativeContact Representative, Family Supports Permission granted to share information::  Yes, Verbal Permission Granted  Name::     [name] AreaStephens,[name] A Spouse [phone] or Lea,Nannie Relative   [phone]   Agency::  SNF admissions  Relationship::     Contact Information:     Housing/Transportation Living arrangements for the past 2 months:  Skilled Building surveyorursing Facility Source of Information:  Spouse, Medical Team Patient Interpreter Needed:  None Criminal Activity/Legal Involvement Pertinent to Current Situation/Hospitalization:  No - Comment as needed Significant Relationships:  Adult Children, Spouse Lives with:  Facility Resident Do you feel safe going back to the place where you live?  Yes Need for family participation in patient care:  Yes (Comment)  Care giving concerns:  Patient's family does not have any concerns about returning back to [facility].   Social Worker assessment / plan:  Patient is an 84 year old male who is alert and oriented x1.  Patient is a long [facility] at [facility], patient has been there for several months.  Patient's family did not express any concerns about going to SNF.  CSW explained process of coordinating with SNF to help patient transfer back to SNF.  Patient's family have decided they would like hospice to follow him at SNF, patient's wife is in agreement to having [facility].  Patient's wife did not have any other questions or concerns.   Employment status:  Retired Database administratornsurance information:  Managed Medicare PT Recommendations:  Not assessed at this time Information / Referral to community resources:     Patient/Family's Response to  care:  Patient's family in agreement to returning back to SNF.  Patient/Family's Understanding of and Emotional Response to Diagnosis, Current Treatment, and Prognosis:  Patient's family is aware of poor prognosis, and are in agreement [facility]  Emotional Assessment Appearance:  Appears stated age Attitude/Demeanor/Rapport:    Affect (typically observed):  Appropriate, Calm Orientation:  Oriented to Self Alcohol / Substance use:  Not Applicable Psych involvement (Current and /or in the community):  No (Comment)  Discharge Needs  Concerns to be addressed:  Care Coordination Readmission within the last 30 days:  No Current discharge risk:  None Barriers to Discharge:  No Barriers Identified   Darleene Cleavernterhaus, Devory Mckinzie R, LCSWA 03/18/2017, 5:32 PM

## 2017-03-18 NOTE — NC FL2 (Signed)
[place] LEVEL OF CARE SCREENING TOOL     IDENTIFICATION  Patient Name: William Byrd Birthdate: September 24, 1931 Sex: male Admission Date (Current Location): 03/11/2017  [place] and [place] Number:  Engineering geologist and Address:  [facility], [address] [place], [place] [address]      Provider Number: [id number]  Attending Physician Name and Address:  Vaughan Basta, *  Relative Name and Phone Number:  Akai, Dollard Spouse [phone] or Lea,Nannie Relative   [phone]     Current Level of Care: Hospital Recommended Level of Care: [place] Prior Approval Number:    Date Approved/Denied:   PASRR Number: [id number]  Discharge Plan: SNF    Current Diagnoses: Patient Active Problem List   Diagnosis Date Noted  . Pressure injury of skin 03/17/2017  . Septic shock ([place]) 03/11/2017  . Dementia in other diseases classified elsewhere without behavioral disturbance 06/20/2016  . Other intervertebral disc degeneration, lumbar region 06/20/2016  . Type 2 diabetes mellitus with unspecified diabetic retinopathy without macular edema ([place]) 06/20/2016  . Type 2 diabetes mellitus with other diabetic ophthalmic complication ([place]) 44/31/5400  . Glaucoma 06/20/2016  . Type 2 diabetes mellitus with diabetic chronic kidney disease ([place]) 06/20/2016  . Type 2 diabetes mellitus with diabetic polyneuropathy ([place]) 06/20/2016  . 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 Hypertensive heart disease with heart failure ([place]) 06/20/2016  . Major depression, single episode 06/20/2016  . Chronic systolic (congestive) heart failure ([place]) 06/20/2016  . Benign prostatic hyperplasia without lower urinary tract symptoms 06/20/2016  . Pure hypercholesterolemia, unspecified 06/20/2016  . Gastroesophageal reflux disease without esophagitis 06/20/2016  . Dysphagia, oral phase  06/20/2016  . Dermatitis associated with moisture 06/20/2016  . CVA (cerebral infarction) 07/15/2015  . HTN (hypertension) 09/05/2014  . Alzheimer's disease 09/05/2014  . Chronic kidney disease, stage III (moderate) (HCC) 09/05/2014    Orientation RESPIRATION BLADDER Height & Weight     Self  O2(3L) Indwelling catheter Weight: 185 lb 6.5 oz (84.1 kg) Height:  5' 11"  (180.3 cm)  BEHAVIORAL SYMPTOMS/MOOD NEUROLOGICAL BOWEL NUTRITION STATUS      Incontinent Feeding tube  AMBULATORY STATUS COMMUNICATION OF NEEDS Skin   Total Care Verbally PU Stage and Appropriate Care   PU Stage 2 Dressing: (Every 3 days)                   Personal Care Assistance Level of Assistance  Bathing, Feeding, Dressing, Total care Bathing Assistance: Maximum assistance Feeding assistance: Maximum assistance Dressing Assistance: Maximum assistance Total Care Assistance: Maximum assistance   Functional Limitations Info  Sight, Speech, Hearing Sight Info: Adequate Hearing Info: Adequate Speech Info: Adequate    SPECIAL CARE FACTORS FREQUENCY                       Contractures Contractures Info: Not present    Additional Factors Info  Code Status, Allergies Code Status Info: DNR Allergies Info: Fosinopril           Current Medications (03/18/2017):  This is the current hospital active medication list Current Facility-Administered Medications  Medication Dose Route Frequency Provider Last Rate Last Dose  . acetaminophen (TYLENOL) tablet 650 mg  650 mg Per Tube Q6H PRN Wilhelmina Mcardle, MD   650 mg at 03/11/17 2346   Or  . acetaminophen (TYLENOL) suppository 650 mg  650 mg Rectal Q6H PRN Wilhelmina Mcardle, MD      .  albuterol (PROVENTIL) (2.5 MG/3ML) 0.083% nebulizer solution 2.5 mg  2.5 mg Nebulization Q2H PRN Demetrios Loll, MD      . chlorhexidine gluconate (MEDLINE KIT) (PERIDEX) 0.12 % solution 15 mL  15 mL Mouth Rinse BID Wilhelmina Mcardle, MD   15 mL at 03/18/17 0855  . glycopyrrolate  (ROBINUL) injection 0.1 mg  0.1 mg Intravenous Q4H PRN Asencion Gowda, NP   0.1 mg at 03/17/17 1612  . LORazepam (ATIVAN) injection 1 mg  1 mg Intravenous Q4H PRN Conforti, John, DO      . morphine 2 MG/ML injection 2-4 mg  2-4 mg Intravenous Q3H PRN Dorene Sorrow S, NP      . ondansetron (ZOFRAN) injection 4 mg  4 mg Intravenous Q6H PRN Demetrios Loll, MD         Discharge Medications: Please see discharge summary for a list of discharge medications.  Relevant Imaging Results:  Relevant Lab Results:   Additional Information ss: [id number]  Ross Ludwig, LCSWA

## 2017-03-18 NOTE — Progress Notes (Signed)
MEDICATION RELATED CONSULT NOTE  Pharmacy Consult for electrolyte/glucose/constipation monitoring   Assessment: 84 yo M who presented to the hospital secondary to unresponsive episode at nursing home.  Plan:  1. Electrolytes WNL. Will recheck electrolytes in 48 hours unless otherwise ordered by MD.   2. Continue Novolog 0-15 units sliding scale q4h  3. Bowel movement documented 2/3.  Continue senna/docusate 1 tab BID and  Dulcolax 10 mg suppository daily as needed for constipation. Will continue to assess appropriateness of bowel regimen.  Pharmacy will continue to monitor and adjust per consult.    Allergies  Allergen Reactions  . Fosinopril Other (See Comments)    Other reaction(s): Other (See Comments) Hyperkalemia Reaction: Hyperkalemia     Patient Measurements: Height: 5\' 11"  (180.3 cm) Weight: 185 lb 6.5 oz (84.1 kg) IBW/kg (Calculated) : 75.3  Vital Signs: Temp: 98.8 F (37.1 C) (02/06 0430) Temp Source: Oral (02/06 0430) BP: 146/60 (02/06 0430) Pulse Rate: 86 (02/06 0430) Intake/Output from previous day: 02/05 0701 - 02/06 0700 In: 1072.5 [I.V.:872.5; IV Piggyback:200] Out: 2500 [Urine:2500] Intake/Output from this shift: No intake/output data recorded.  Labs: Recent Labs    03/16/17 0407 03/18/17 0435  WBC 7.3  --   HGB 7.2*  --   HCT 21.3*  --   PLT 193  --   CREATININE 2.23* 2.06*  PHOS 2.8 2.8  ALBUMIN 2.2* 2.4*   Estimated Creatinine Clearance: 28.9 mL/min (A) (by C-G formula based on SCr of 2.06 mg/dL (H)).  Medical History: Past Medical History:  Diagnosis Date  . Arthritis   . Background retinopathy   . Cervicalgia   . Chronic kidney disease    follow up with a Dr.   . Chronic systolic CHF (congestive heart failure) (HCC)   . Congenital heart failure (HCC)   . Degeneration of lumbar or lumbosacral intervertebral disc   . Dementia   . Diabetes mellitus type 2, uncomplicated (HCC) 1996   on insulin since 2011, last eye exam with  Dr. Inez Pilgrim 07/2013  . Diabetes mellitus without complication (HCC)   . DVT (deep venous thrombosis) (HCC)   . Essential hypertension    unspecified  . Generalized weakness   . Glaucoma   . Hypercholesteremia   . Hypertension   . Long-term insulin use (HCC)   . Moderate dementia   . Peripheral polyneuropathy   . Peripheral vascular disease (HCC)   . Pneumonia, unspecified organism 04/01/2014  . Pure hypercholesterolemia   . Spinal stenosis   . Spinal stenosis, lumbar region without neurogenic claudication     Medications:  Medications Prior to Admission  Medication Sig Dispense Refill Last Dose  . acetaminophen (TYLENOL) 325 MG tablet Take 650 mg by mouth every 6 (six) hours as needed for mild pain, moderate pain, fever or headache.   PRN at PRN  . albuterol (PROVENTIL HFA;VENTOLIN HFA) 108 (90 Base) MCG/ACT inhaler Inhale 2 puffs into the lungs every 4 (four) hours as needed for wheezing or shortness of breath.   PRN at PRN  . amLODipine (NORVASC) 10 MG tablet Take 1 tablet (10 mg total) by mouth daily. 30 tablet 0 03/10/2017 at 0900  . benzonatate (TESSALON) 100 MG capsule Take 200 mg by mouth every 8 (eight) hours as needed for cough.   PRN at PRN  . bisacodyl (DULCOLAX) 5 MG EC tablet Take 1 tablet (5 mg total) by mouth daily as needed for moderate constipation. 30 tablet 0 PRN at PRN  . cefTRIAXone (ROCEPHIN) IVPB Inject 2  g into the vein once.   03/10/2017 at 1330  . cholecalciferol (VITAMIN D) 1000 UNITS tablet Take 1,000 Units by mouth daily.   03/10/2017 at 0900  . docusate sodium (STOOL SOFTENER) 100 MG capsule Take 100 mg by mouth daily as needed for mild constipation or moderate constipation.    PRN at PRN  . donepezil (ARICEPT) 10 MG tablet Take 10 mg by mouth at bedtime.    03/09/2017 at 2300  . DULoxetine (CYMBALTA) 20 MG capsule Take 20 mg by mouth daily.   03/10/2017 at 0900  . finasteride (PROSCAR) 5 MG tablet Take 5 mg by mouth daily. *staff : do not handle without  gloves*   03/10/2017 at 0900  . furosemide (LASIX) 20 MG tablet Take 2 tablets (40 mg) by mouth daily each morning. Take an additional 1 tablet (20 mg) by mouth  6 hours later.   03/10/2017 at 1730  . glucagon (GLUCAGON EMERGENCY) 1 MG injection Inject 1 mg into the vein once as needed.   PRN at PRN  . GLUCERNA (GLUCERNA) LIQD Take 1 Can by mouth daily.    UTD at UTD  . HYDROcodone-acetaminophen (NORCO/VICODIN) 5-325 MG tablet Take 1 tablet by mouth every 6 (six) hours as needed for moderate pain. 20 tablet 0 PRN at PRN  . Insulin Glargine (TOUJEO SOLOSTAR) 300 UNIT/ML SOPN Inject 60 Units into the skin every morning. 8 am    03/10/2017 at 0900  . insulin regular (NOVOLIN R,HUMULIN R) 100 units/mL injection Inject 3-12 Units into the skin 4 (four) times daily -  before meals and at bedtime. If BS < 60 call NP/PA, If BS is 175 to 250 give 3 units, 251 to 325 give 6 units, 326 to 450, give 10 units, if BS > 450 give 12 units and call NP/PA, check bs ac&hs, if < 175 document 0 units    03/10/2017 at 2015  . ipratropium-albuterol (DUONEB) 0.5-2.5 (3) MG/3ML SOLN Take 3 mLs by nebulization as needed.    PRN at PRN  . memantine (NAMENDA XR) 28 MG CP24 24 hr capsule Take 28 mg by mouth daily.    03/10/2017 at 0900  . metoprolol tartrate (LOPRESSOR) 25 MG tablet Take 1 tablet (25 mg total) by mouth 2 (two) times daily.   03/10/2017 at 0900  . OXYGEN Inhale 2 L into the lungs as needed. Check o2 sat prior to placing on patient and at least every 4 hours after applying   UTD at UTD  . pantoprazole (PROTONIX) 20 MG tablet Take 20 mg by mouth daily.   03/10/2017 at 0530  . pravastatin (PRAVACHOL) 10 MG tablet Take 10 mg by mouth daily.   03/08/2017 at 1700  . pregabalin (LYRICA) 75 MG capsule Take 1 capsule (75 mg total) by mouth 2 (two) times daily. 60 capsule 4 03/10/2017 at 0900  . Probiotic Product (RISA-BID PROBIOTIC) TABS Take 1 tablet by mouth 2 (two) times daily.   UTD at UTD  . rivaroxaban (XARELTO) 20 MG TABS  tablet Take 1 tablet by mouth daily.    03/10/2017 at 1730  . sacubitril-valsartan (ENTRESTO) 49-51 MG Take 1 tablet by mouth 2 (two) times daily.   03/10/2017 at 0900  . senna (SENOKOT) 8.6 MG TABS tablet Take 1 tablet (8.6 mg total) by mouth daily. 100 each 0 03/10/2017 at 0900  . tamsulosin (FLOMAX) 0.4 MG CAPS capsule Take 0.4 mg by mouth at bedtime.   03/09/2017 at 2300  . timolol (TIMOPTIC) 0.5 %  ophthalmic solution Place 1 drop into both eyes 2 (two) times daily.    03/10/2017 at 2015  . Wound Dressings (TRIAD HYDROPHILIC WOUND DRESSI) PSTE Apply cream topically to macerated areas of buttocks daily and prn   UTD at UTD    Carola Frost, RPH 03/18/2017,7:18 AM

## 2017-03-18 NOTE — Clinical Social Work Note (Addendum)
Patient's family have decided they would like to have [facility] with hospice following him.  CSW spoke to patient's wife and [facility]  CSW [facility] nurse liasion [facility] [address] [facility]  [facility].  Patient and family agreeable to plans will transport via ems RN to call report to [phone].  Windell MouldingEric Kasandra Fehr, MSW, Theresia MajorsLCSWA [phone]

## 2017-03-18 NOTE — Progress Notes (Addendum)
Daily Progress Note   Patient Name: William Byrd       Date: 03/18/2017 DOB: January 15, 1932  Age: 84 y.o. MRN#: [id number] Attending Physician: Vaughan Basta, * Primary Care Physician: Dion Body, MD Admit Date: 03/11/2017  Reason for Consultation/Follow-up: Establishing goals of care and Terminal Care  Subjective: William Byrd is resting in bed. Wife and daughter at bedside. He opens his eyes a couple of times to the ceiling and smiles. He said "what" one time when his wife called his name. He has excessive oral secretions.   Family states that prior to this hospitalization, he was forgetful and sometimes did not recognize his daughter. He was nonambulatory, using a wheelchair. He has been incontinent and using depends. Discussed concerns for his quality of life prior to hospitalization and in the future.    Family would like for William Byrd to be made comfort care and returned to his facility.   MOST form in chart.   Length of Stay: 7  Current Medications: Scheduled Meds:  . chlorhexidine gluconate (MEDLINE KIT)  15 mL Mouth Rinse BID    Continuous Infusions:   PRN Meds: acetaminophen **OR** acetaminophen, albuterol, glycopyrrolate, LORazepam, morphine injection, [DISCONTINUED] ondansetron **OR** ondansetron (ZOFRAN) IV  Physical Exam  Constitutional: No distress.  Pulmonary/Chest: Effort normal.  Neurological:  Opens eyes intermittently.   Skin: Skin is warm and dry.            Vital Signs: BP (!) 146/60 (BP Location: Left Arm)   Pulse 86   Temp 98.8 F (37.1 C) (Oral)   Resp 16   Ht 5' 11"  (1.803 m)   Wt 84.1 kg (185 lb 6.5 oz)   SpO2 94%   BMI 25.86 kg/m  SpO2: SpO2: 94 % O2 Device: O2 Device: Nasal Cannula O2 Flow Rate: O2 Flow Rate (L/min): 2  L/min  Intake/output summary:   Intake/Output Summary (Last 24 hours) at 03/18/2017 1507 Last data filed at 03/18/2017 0515 Gross per 24 hour  Intake 1072.5 ml  Output 1700 ml  Net -627.5 ml   LBM: Last BM Date: 03/15/17 Baseline Weight: Weight: 79.8 kg (176 lb) Most recent weight: Weight: 84.1 kg (185 lb 6.5 oz)       Palliative Assessment/Data: 10%    Flowsheet Rows     Most Recent Value  Intake  Tab  Referral Department  Hospitalist  Unit at Time of Referral  Med/Surg Unit  Palliative Care Primary Diagnosis  Sepsis/Infectious Disease  Date Notified  03/17/17  Palliative Care Type  New Palliative care  Reason Not Seen  Consult cancelled  Reason for referral  Clarify Goals of Care  Date of Admission  03/11/17  Date first seen by Palliative Care  03/17/17  # of days Palliative referral response time  0 Day(s)  # of days IP prior to Palliative referral  6  Clinical Assessment  Psychosocial & Spiritual Assessment  Palliative Care Outcomes      Patient Active Problem List   Diagnosis Date Noted  . Pressure injury of skin 03/17/2017  . Septic shock ([place]) 03/11/2017  . Dementia in other diseases classified elsewhere without behavioral disturbance 06/20/2016  . Other intervertebral disc degeneration, lumbar region 06/20/2016  . Type 2 diabetes mellitus with unspecified diabetic retinopathy without macular edema ([place]) 06/20/2016  . Type 2 diabetes mellitus with other diabetic ophthalmic complication ([place]) 76/28/3151  . Glaucoma 06/20/2016  . Type 2 diabetes mellitus with diabetic chronic kidney disease ([place]) 06/20/2016  . Type 2 diabetes mellitus with diabetic polyneuropathy ([place]) 06/20/2016  . 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 Hypertensive heart disease with heart failure ([place]) 06/20/2016  . Major depression, single episode 06/20/2016  . Chronic systolic (congestive) heart failure ([place]) 06/20/2016  .  Benign prostatic hyperplasia without lower urinary tract symptoms 06/20/2016  . Pure hypercholesterolemia, unspecified 06/20/2016  . Gastroesophageal reflux disease without esophagitis 06/20/2016  . Dysphagia, oral phase 06/20/2016  . Dermatitis associated with moisture 06/20/2016  . CVA (cerebral infarction) 07/15/2015  . HTN (hypertension) 09/05/2014  . Alzheimer's disease 09/05/2014  . Chronic kidney disease, stage III (moderate) ([facility]) 09/05/2014    Palliative Care Assessment & Plan   Patient Profile: William Byrd a85 y.o.malewith dementia. The patient was sent from nursing home to the ED due to unresponsiveness. Per EMR, the patient has been somnolent and non-responsive for the past 3 days. He was noted to be febrile in the ED. He was treated with nonrebreather mask but O2 saturation was still low.Chest x-ray showed pneumonia. The patient was found septic and hypotensive, treated with normal saline bolus and antibiotics in the ED. He was intubated in the ED   Assessment/ Recommendations/Plan:  Increased oral secretions. Non-verbal.  Return to LTC facility with hospice.  MOST form in chart.   Morphine for pain and dyspnea Ativan for anxiety Rubinol for secrections  Goals of Care and Additional Recommendations:  Limitations on Scope of Treatment: Full Comfort Care  Code Status:    Code Status Orders  (From admission, onward)        Start     Ordered   03/11/17 1736  Do not attempt resuscitation (DNR)  Continuous    Question Answer Comment  In the event of cardiac or respiratory ARREST Do not call a "code blue"   In the event of cardiac or respiratory ARREST Do not perform Intubation, CPR, defibrillation or ACLS   In the event of cardiac or respiratory ARREST Use medication by any route, position, wound care, and other measures to relive pain and suffering. May use oxygen, suction and manual treatment of airway obstruction as needed for comfort.       03/11/17 1736    Code Status History    Date Active Date Inactive Code Status Order ID Comments User Context   03/11/2017  15:21 03/11/2017 17:36 Full Code [id number]  Demetrios Loll, MD Inpatient   04/05/2016 22:05 04/08/2016 19:51 Full Code [id number]  Lance Coon, MD Inpatient   11/10/2015 00:25 11/15/2015 18:25 Full Code [id number]  Harvie Bridge, DO Inpatient   10/17/2015 20:18 10/19/2015 15:09 Full Code [id number]  Henreitta Leber, MD Inpatient   07/15/2015 08:30 07/18/2015 21:00 Full Code [id number]  Dustin Flock, MD ED   12/28/2014 21:50 12/31/2014 16:38 Full Code [id number]  Vaughan Basta, MD Inpatient   09/05/2014 04:09 09/08/2014 18:11 Full Code [id number]  Juluis Mire, MD ED    Advance Directive Documentation     Most Recent Value  Type of Advance Directive  Healthcare Power of Attorney  Pre-existing out of facility DNR order (yellow form or pink MOST form)  No data  "MOST" Form in Place?  No data       Prognosis:   < 2 weeks Excessive oral secretions. No oral intake.   Discharge Planning:  Facility with hospice.   Care plan was discussed with Anselm Jungling, SW, RN  Thank you for allowing the Palliative Medicine Team to assist in the care of this patient.   Time In: 2:05 Time Out: 3:15 Total Time 70 min Prolonged Time Billed  Yes       Greater than 50%  of this time was spent counseling and coordinating care related to the above assessment and plan.  Asencion Gowda, NP  Please contact Palliative Medicine Team phone at [phone] for questions and concerns.

## 2017-03-19 ENCOUNTER — Encounter: Payer: Self-pay | Admitting: Gerontology

## 2017-03-19 ENCOUNTER — Other Ambulatory Visit: Payer: Self-pay

## 2017-03-19 ENCOUNTER — Non-Acute Institutional Stay (SKILLED_NURSING_FACILITY): Payer: Medicare HMO | Admitting: Gerontology

## 2017-03-19 DIAGNOSIS — J9620 Acute and chronic respiratory failure, unspecified whether with hypoxia or hypercapnia: Secondary | ICD-10-CM | POA: Diagnosis not present

## 2017-03-19 DIAGNOSIS — Z515 Encounter for palliative care: Secondary | ICD-10-CM

## 2017-03-19 MED ORDER — MORPHINE SULFATE (CONCENTRATE) 20 MG/ML PO SOLN: 10.0000 mg | ORAL | 0 refills | Status: AC | PRN

## 2017-03-19 NOTE — Progress Notes (Signed)
Location:   [facility] Room Number: [id number] Place of Service:  SNF [phone]) Provider:  Lorenso QuarryShannon Tighe Gitto, NP-C  [name], [name], MD  Patient Care Team: [name], [name], MD as PCP - General (Family Medicine)  Extended Emergency Contact Information Primary Emergency Contact: [name] AreaStephens,[name] A Address: [phone]          [place], [place] [id number] [place] Home Phone: [phone] Relation: Spouse Secondary Emergency Contact: Christoper FabianLea,Nannie  [place] Mobile Phone: [phone] Relation: Relative  Code Status:  FULL Goals of care: Advanced Directive information Advanced Directives 03/15/2017  Does Patient Have a Medical Advance Directive? Yes  Type of Estate agentAdvance Directive Healthcare Power of Com[place]Attorney;Living will  Does patient want to make changes to medical advance directive? No - Patient declined  Copy of Healthcare Power of Attorney in Chart? Yes  Would patient like information on creating a medical advance directive? -     Chief Complaint  Patient presents with  . Acute Visit    Recheck Respiratory Distress    HPI:  Pt is a 84 y.o. male seen today for an acute visit for respiratory failure. Nurse asked me to assess pt as he appeared to be in respiratory distress. Pt was pale, diaphoretic, febrile. He had tachypnea with respirations ~32/min. Coarse, copious pulmonary secretions audible. Tachycardia. O2 4 L [place] in place. Nursing notified wife. Wife is now agreeable to Hospice services. (Order was submitted to Hospice yesterday- pt is scheduled to be opened to services today.) Pt is also now a DNR. Multiple interventions initiated more management of secretions and dyspnea. Two doses of Roxanol and Ativan given for the dyspnea and anxiety. Duonebs x 2, Scopolamine patch placed, Atropine drops placed under the tongue x2, Robinul SQ injection given x 1 for secretions. O2 4L [place] in place. Pt obtained comfort and passed away peacefully at  1043 am. No audible apical pulse and no respirations for > 1 minute. Death pronounced by me. Copious amounts of thick secretions came out of mouth. Pt cleaned up. Wife arrived at bedside 5 minutes later. Hospice was unable to open to services but still provided comfort and support for the wife through the [place], Psychologist, clinicalsocial worker and Hospice nurse. Other family arrived shortly after and were grieving appropriately.   Please note pt with limited verbal ability at onset of assessment. Unable to obtain complete ROS. Some ROS info obtained from staff and documentation.     Past Medical History:  Diagnosis Date  . Arthritis   . Background retinopathy   . Cervicalgia   . Chronic kidney disease    follow up with a Dr.   . Chronic systolic CHF (congestive heart failure) (HCC)   . Congenital heart failure (HCC)   . Degeneration of lumbar or lumbosacral intervertebral disc   . Dementia   . Diabetes mellitus type 2, uncomplicated (HCC) 1996   on insulin since 2011, last eye exam with Dr. Inez PilgrimBrasington 07/2013  . Diabetes mellitus without complication (HCC)   . DVT (deep venous thrombosis) (HCC)   . Essential hypertension    unspecified  . Generalized weakness   . Glaucoma   . Hypercholesteremia   . Hypertension   . Long-term insulin use (HCC)   . Moderate dementia   . Peripheral polyneuropathy   . Peripheral vascular disease (HCC)   . Pneumonia, unspecified organism 04/01/2014  . Pure hypercholesterolemia   . Spinal stenosis   . Spinal stenosis, lumbar region without neurogenic claudication  Past Surgical History:  Procedure Laterality Date  . ANGIOPLASTY / STENTING FEMORAL  1999  . ANTERIOR FUSION CERVICAL SPINE  07/2009  . c-spine fusion N/A   . COLONOSCOPY  05/23/2004   Dr. Maryruth Bun, normal, PH colon polpys    Allergies  Allergen Reactions  . Fosinopril Other (See Comments)    Other reaction(s): Other (See Comments) Hyperkalemia Reaction: Hyperkalemia     Allergies as of  04-15-2017      Reactions   Fosinopril Other (See Comments)   Other reaction(s): Other (See Comments) Hyperkalemia Reaction: Hyperkalemia       Medication List        Accurate as of 04/15/2017  2:35 PM. Always use your most recent med list.          acetaminophen 325 MG tablet Commonly known as:  TYLENOL Take 650 mg by mouth every 6 (six) hours as needed.   acetaminophen 650 MG suppository Commonly known as:  TYLENOL Place 650 mg rectally every 4 (four) hours as needed.   atropine 1 % ophthalmic solution Place 2 drops under the tongue every 30 (thirty) minutes as needed.   bisacodyl 5 MG EC tablet Commonly known as:  DULCOLAX Take 1 tablet (5 mg total) by mouth daily as needed for moderate constipation.   LORazepam 0.5 MG tablet Commonly known as:  ATIVAN Take 0.5 mg by mouth every 4 (four) hours as needed.   morphine 20 MG/ML concentrated solution Commonly known as:  ROXANOL Take 0.5 mLs (10 mg total) by mouth every hour as needed for severe pain or anxiety.   OXYGEN Inhale 2 L into the lungs as needed. Check o2 sat prior to placing on patient and at least every 4 hours after applying Notify D if oxygen sats drop below baseline while receiving oxygen or no improvement in dyspnea.   OXYGEN Inhale 2-4 L into the lungs as needed. Titrate for comfort   scopolamine 1 MG/3DAYS Commonly known as:  TRANSDERM-SCOP Place 1 patch onto the skin every 3 (three) days. Place behind the ear for secretions       Review of Systems  Unable to perform ROS: Acuity of condition  Constitutional: Positive for diaphoresis and fever.  Respiratory: Positive for apnea, shortness of breath and wheezing.   Skin: Positive for color change.    Immunization History  Administered Date(s) Administered  . Influenza Split 12/04/2014  . Influenza,inj,Quad PF,6+ Mos 11/11/2015  . Influenza-Unspecified 11/17/2011, 12/04/2014, 10/30/2016  . PPD Test 02/25/2014, 03/11/2014, 04/08/2016  .  Pneumococcal Polysaccharide-23 02/11/2008   Pertinent  Health Maintenance Due  Topic Date Due  . FOOT EXAM  07/16/1941  . OPHTHALMOLOGY EXAM  07/16/1941  . URINE MICROALBUMIN  07/16/1941  . PNA vac Low Risk Adult (2 of 2 - PCV13) 02/10/2009  . HEMOGLOBIN A1C  08/31/2017  . INFLUENZA VACCINE  Completed   No flowsheet data found. Functional Status Survey:    Vitals:   2017-04-15 1050  BP: (!) 161/65  Pulse: 72  Resp: (!) 22  Temp: 98.5 F (36.9 C)  TempSrc: Oral  SpO2: 93%  Weight: 178 lb (80.7 kg)  Height: 5\' 6"  (1.676 m)   Body mass index is 28.73 kg/m. Physical Exam  Constitutional: He appears well-developed and well-nourished. He appears listless. He appears toxic. He appears distressed. Nasal cannula in place.  Cardiovascular: An irregular rhythm present. Tachycardia present.  Pulses:      Dorsalis pedis pulses are 0 on the right side, and 0 on the left  side.  B- feet cold, mottling  Pulmonary/Chest: Accessory muscle usage present. Tachypnea noted. He is in respiratory distress. He has decreased breath sounds in the right lower field and the left lower field. He has rhonchi in the right upper field, the right middle field, the left upper field and the left middle field. He has rales in the right upper field, the right middle field, the left upper field and the left middle field.  Abdominal: Soft. Normal appearance. Bowel sounds are absent.  Neurological: He appears listless. He exhibits abnormal muscle tone. Coordination abnormal.  Skin: He is diaphoretic. There is cyanosis.  B- feet cold, mottling    Labs reviewed: Recent Labs    03/03/17 0455  03/13/17 0501 03/13/17 2356  03/15/17 0651 03/16/17 0407 03/18/17 0435  NA 140   < > 152* 145   < > 143 147* 153*  K 4.4   < > 4.1 4.1   < > 3.8 3.8 4.3  CL 105   < > 117* 117*   < > 113* 118* 123*  CO2 26   < > 21* 20*   < > 20* 21* 19*  GLUCOSE 169*   < > 199* 283*   < > 201* 195* 122*  BUN 81*   < > 72* 70*   < >  55* 51* 47*  CREATININE 3.32*   < > 3.21* 3.11*   < > 2.65* 2.23* 2.06*  CALCIUM 8.7*   < > 8.5* 8.0*   < > 8.5* 8.7* 8.9  MG 3.1*  --  3.0* 2.9*  --   --   --   --   PHOS  --    < > 3.2 2.8  --  3.0 2.8 2.8   < > = values in this interval not displayed.   Recent Labs    11/14/16 1236 03/03/17 0455 03/11/17 1201 03/15/17 0651 03/16/17 0407 03/18/17 0435  AST 21 16 36  --   --   --   ALT 17 13* 27  --   --   --   ALKPHOS 76 67 80  --   --   --   BILITOT 0.7 0.7 0.7  --   --   --   PROT 7.6 7.1 8.5*  --   --   --   ALBUMIN 3.2* 2.9* 3.0* 2.2* 2.2* 2.4*   Recent Labs    11/14/16 1236 03/03/17 0455 03/11/17 1201  03/14/17 0348 03/15/17 0651 03/16/17 0407  WBC 9.6 4.1 8.9   < > 8.7 6.5 7.3  NEUTROABS 7.8* 2.4 7.1*  --   --   --   --   HGB 9.4* 8.2* 8.9*   < > 7.2* 7.4* 7.2*  HCT 27.0* 25.1* 26.9*   < > 21.5* 22.4* 21.3*  MCV 94.9 92.4 91.4   < > 91.5 90.0 90.2  PLT 225 237 285   < > 179 190 193   < > = values in this interval not displayed.   Lab Results  Component Value Date   TSH 3.348 03/03/2017   Lab Results  Component Value Date   HGBA1C 8.5 (H) 03/03/2017   Lab Results  Component Value Date   CHOL 199 03/03/2017   HDL 26 (L) 03/03/2017   LDLCALC 137 (H) 03/03/2017   TRIG 323 (H) 03/16/2017   CHOLHDL 7.7 03/03/2017    Significant Diagnostic Results in last 30 days:  Dg Abd 1 View  Result Date: 03/14/2017 CLINICAL DATA:  Abdominal distension. EXAM: ABDOMEN - 1 VIEW COMPARISON:  Radiographs of March 12, 2017. FINDINGS: The bowel gas pattern is normal. Large amount of stool is noted in the rectum. Distal tip of nasogastric tube is seen projected over distal stomach. No abnormal calcifications are noted. IMPRESSION: No evidence of bowel obstruction or ileus. Large amount of stool is noted in the rectum. Electronically Signed   By: Lupita Raider, M.D.   On: 03/14/2017 02:11   Dg Abd 1 View  Result Date: 03/12/2017 CLINICAL DATA:  Abdominal distension EXAM:  ABDOMEN - 1 VIEW COMPARISON:  03/11/2017 FINDINGS: Nasogastric tube with the tip projecting over the stomach. There is a moderate amount of stool throughout the colon. There is no bowel dilatation to suggest obstruction. There is no evidence of pneumoperitoneum, portal venous gas or pneumatosis. There are calcifications scattered throughout the spleen likely reflecting sequela prior granulomatous disease. There are no pathologic calcifications along the expected course of the ureters. There is right lower lobe airspace disease and to a lesser extent left lower lobe airspace disease concerning for pneumonia versus aspiration pneumonia. The osseous structures are unremarkable. IMPRESSION: 1. Nasogastric tube with the tip projecting over the stomach. No evidence of obstruction. 2. Moderate amount of stool throughout the colon. 3. Bilateral lower lobe airspace disease, right greater than left concerning for pneumonia versus aspiration pneumonia. Electronically Signed   By: Elige Ko   On: 03/12/2017 10:32   Dg Abdomen 1 View  Result Date: 03/11/2017 CLINICAL DATA:  status post OG tube placement. EXAM: ABDOMEN - 1 VIEW COMPARISON:  11/15/2015 FINDINGS: The enteric tube tip is within the projection of the distal stomach. Side port below GE junction. No dilated loops of small bowel identified. IMPRESSION: 1. Tip of enteric tube in the projection of the distal stomach. Electronically Signed   By: Signa Kell M.D.   On: 03/11/2017 12:59   US Renal  Result Date: 03/12/2017 CLINICAL DATA:  Acute kidney failure EXAM: RENAL / URINARY TRACT ULTRASOUND COMPLETE COMPARISON:  None. FINDINGS: Right Kidney: Length: 9.9 cm. Echogenicity within normal limits. Small echogenic foci in the right kidney likely reflecting small nephrolithiasis with the largest measuring 9 mm. No mass or hydronephrosis visualized. Left Kidney: Length: 10.7 cm. Echogenicity within normal limits. Small echogenic focus in the left kidney likely  reflecting small nephrolithiasis measuring 7 mm. No mass or hydronephrosis visualized. Bladder: Appears normal for degree of bladder distention. IMPRESSION: 1. No obstructive uropathy. 2. Bilateral nonobstructing nephrolithiasis. Electronically Signed   By: Elige Ko   On: 03/12/2017 14:52   US Venous Img Upper Uni Left  Result Date: 03/16/2017 CLINICAL DATA:  84 year old male with a history of swelling EXAM: LEFT UPPER EXTREMITY VENOUS DOPPLER ULTRASOUND TECHNIQUE: Gray-scale sonography with graded compression, as well as color Doppler and duplex ultrasound were performed to evaluate the upper extremity deep venous system from the level of the subclavian vein and including the jugular, axillary, basilic, radial, ulnar and upper cephalic vein. Spectral Doppler was utilized to evaluate flow at rest and with distal augmentation maneuvers. COMPARISON:  None. FINDINGS: Contralateral Subclavian Vein: Respiratory phasicity is normal and symmetric with the symptomatic side. No evidence of thrombus. Normal compressibility. Internal Jugular Vein: No evidence of thrombus. Normal compressibility, respiratory phasicity and response to augmentation. Subclavian Vein: No evidence of thrombus. Normal compressibility, respiratory phasicity and response to augmentation. Axillary Vein: No evidence of thrombus. Normal compressibility, respiratory phasicity and response to augmentation. Cephalic Vein: No evidence of thrombus. Normal compressibility, respiratory  phasicity and response to augmentation. Basilic Vein: No evidence of thrombus. Normal compressibility, respiratory phasicity and response to augmentation. Brachial Veins: Paired brachial veins are imaged. One of 2 brachial veins demonstrates occlusive thrombus. Radial Veins: No evidence of thrombus. Normal compressibility, respiratory phasicity and response to augmentation. Ulnar Veins: No evidence of thrombus. Normal compressibility, respiratory phasicity and response to  augmentation. Other Findings:  None visualized. IMPRESSION: Sonographic survey of the right upper extremity is positive for occlusive DVT in 1 of 2 paired brachial veins. Electronically Signed   By: Gilmer Mor D.O.   On: 03/16/2017 09:02   Dg Chest Port 1 View  Result Date: 03/16/2017 CLINICAL DATA:  Ventilator EXAM: PORTABLE CHEST 1 VIEW COMPARISON:  03/15/2017 FINDINGS: Endotracheal tube and NG tube are unchanged. Patchy bilateral lower lobe airspace opacities are noted, right greater than left, not significantly changed. Heart is borderline in size. No effusions or acute bony abnormality. IMPRESSION: Patchy bilateral lower lobe airspace opacities, right greater than left concerning for pneumonia. No real change. Electronically Signed   By: Charlett Nose M.D.   On: 03/16/2017 09:36   Dg Chest Port 1 View  Result Date: 03/15/2017 CLINICAL DATA:  Respiratory failure. EXAM: PORTABLE CHEST 1 VIEW COMPARISON:  03/13/2017 FINDINGS: Endotracheal tube terminates 2.5 cm above the carina. Cardiomediastinal silhouette is normal. Mediastinal contours appear intact. There is no evidence of pneumothorax. Slight worsening and patchy right lung alveolar and interstitial pulmonary opacities. Milder peribronchial airspace consolidation in the left lower lobe. Osseous structures are without acute abnormality. Soft tissues are grossly normal. IMPRESSION: Slight worsening in the patchy right alveolar and interstitial pulmonary infiltrates. More subtle peribronchial airspace disease in the left lower lobe. Electronically Signed   By: Ted Mcalpine M.D.   On: 03/15/2017 08:01   Dg Chest Port 1 View  Result Date: 03/13/2017 CLINICAL DATA:  Followup ventilator support EXAM: PORTABLE CHEST 1 VIEW COMPARISON:  03/12/2017 FINDINGS: Endotracheal tube tip is 4 cm above the carina. Nasogastric tube enters the abdomen. Persistent bilateral lower lobe pneumonia, not significantly changed. One could question slightly worsened  volume loss in the left lower lobe. No visible effusion. Multiple granulomas in the spleen again seen. IMPRESSION: Tubes well positioned. Persistent bilateral lower lobe pneumonia, possibly with more volume loss in the left lower lobe. Electronically Signed   By: Paulina Fusi M.D.   On: 03/13/2017 07:36   Dg Chest Port 1 View  Result Date: 03/12/2017 CLINICAL DATA:  Respiratory distress EXAM: PORTABLE CHEST 1 VIEW COMPARISON:  Yesterday FINDINGS: Endotracheal tube tip just below the clavicular heads. An orogastric tube reaches the stomach. Indistinct bilateral lung opacities greatest at the bases, progressed in density and extent. Normal heart size for technique. No edema, effusion, or pneumothorax. Artifact from EKG leads. Granulomatous calcifications in the spleen. IMPRESSION: 1. Progressive bilateral pneumonia. 2. Unremarkable positioning of endotracheal and orogastric tubes. Electronically Signed   By: Marnee Spring M.D.   On: 03/12/2017 08:17   Dg Chest Portable 1 View  Result Date: 03/11/2017 CLINICAL DATA:  Status post intubation.  OG tube placement EXAM: PORTABLE CHEST 1 VIEW COMPARISON:  11/14/2016 FINDINGS: ET tube tip is above the carina. An enteric tube is not visualized. Normal heart size. No pleural effusion or edema. Bilateral lower lobe airspace opacities are new from previous exam. IMPRESSION: 1. ET tube tip is in satisfactory position above the carina. 2. Nonvisualization of enteric tube. 3. Bilateral lower lobe airspace opacities. Electronically Signed   By: Veronda Prude.D.  On: 03/11/2017 12:56    Assessment/Plan Acute on chronic respiratory failure, unspecified whether with hypoxia or hypercapnia (HCC)  Encounter for dying care  Change Morphine 20 mg/mL- 0.5 mL po Q 1 hour prn- pain, anxiety, dyspnea  Robinul 0.2 mcg/1 mL- Give 0.5 mL SQ X 1 now (10:15 am) for severe secretions  Scopolamine patch 1.5 mg Q 3 days for secretions  Atropine 1% ophthalmic drops- 2 drops  SL Q 30 minutes prn- secretions  Lorazepam 0.5 mg po Q 4 hours prn- anxiety, agitation  O2 2-5 L/min [place]- titrate for comfort  Nurse may pronounce death  2022/07/04 release body to funeral home of choice  Family/ staff Communication:   Total Time: 95 minutes  Documentation: 5 minutes  Face to Face: 80 minutes  Family/Phone: 10 minutes   Labs/tests ordered:    Medication list reviewed and assessed for continued appropriateness.  Brynda Rim, NP-C [facility] [phone] [address], [place] [address] Cell Phone (Mon-Fri 8am-5pm):  [phone] On Call:  [phone] & follow prompts after 5pm & weekends Office Phone:  [phone] Office Fax:  [phone]

## 2017-03-19 NOTE — Telephone Encounter (Signed)
Rx sent to [facility] phone : [phone] , fax : [phone]  

## 2017-03-19 NOTE — Progress Notes (Signed)
This encounter was created in error - please disregard.

## 2017-04-10 DEATH — deceased

## 2018-09-27 IMAGING — DX DG CHEST 1V
1 series · 2 of 2 positions shown · non-contrast
Comparison: Single-view of the chest 04/05/2016 and PA and lateral
chest 11/13/2015.

CLINICAL DATA: Hyperglycemia and hypotension today. Vomiting today.

EXAM:
CHEST 1 VIEW

[Series 1: chest ap · 0.14mm/px · 2 of 2 slices shown]
[im 1/2]
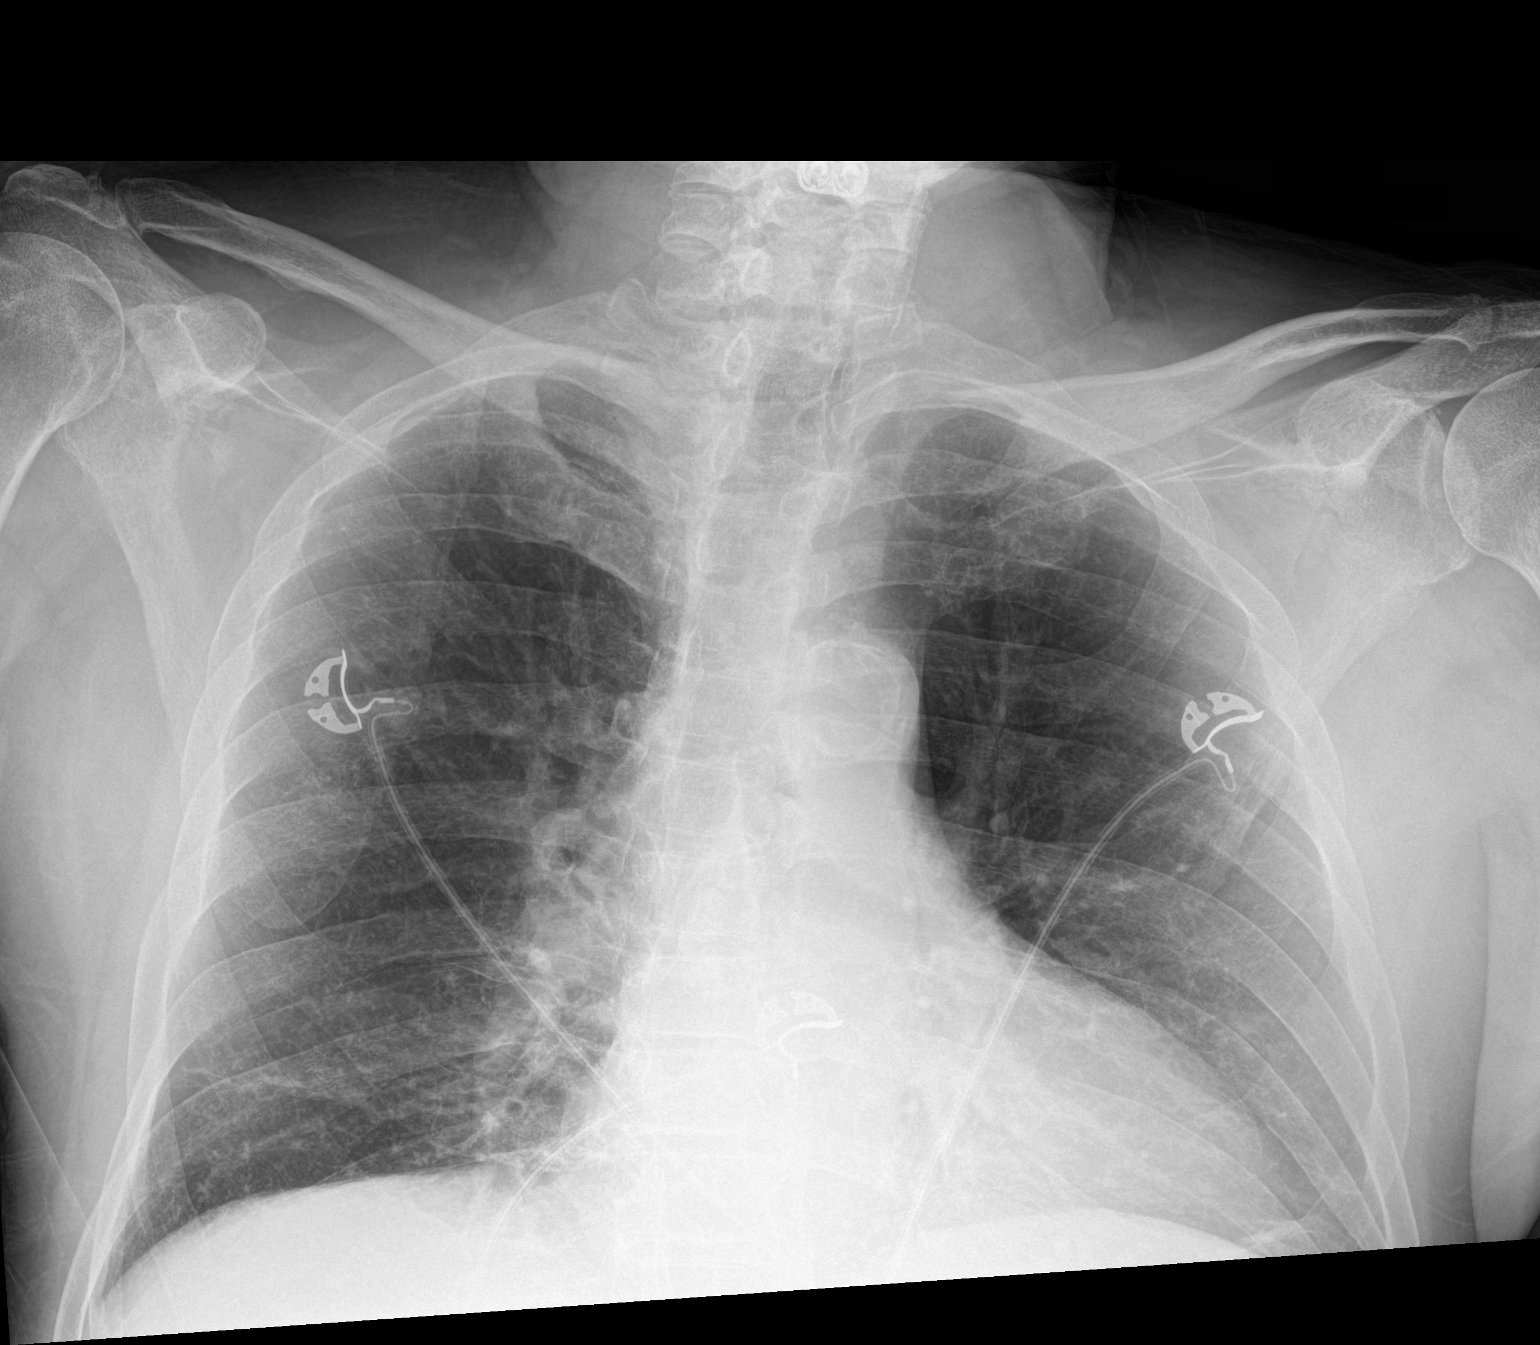
[im 2/2]
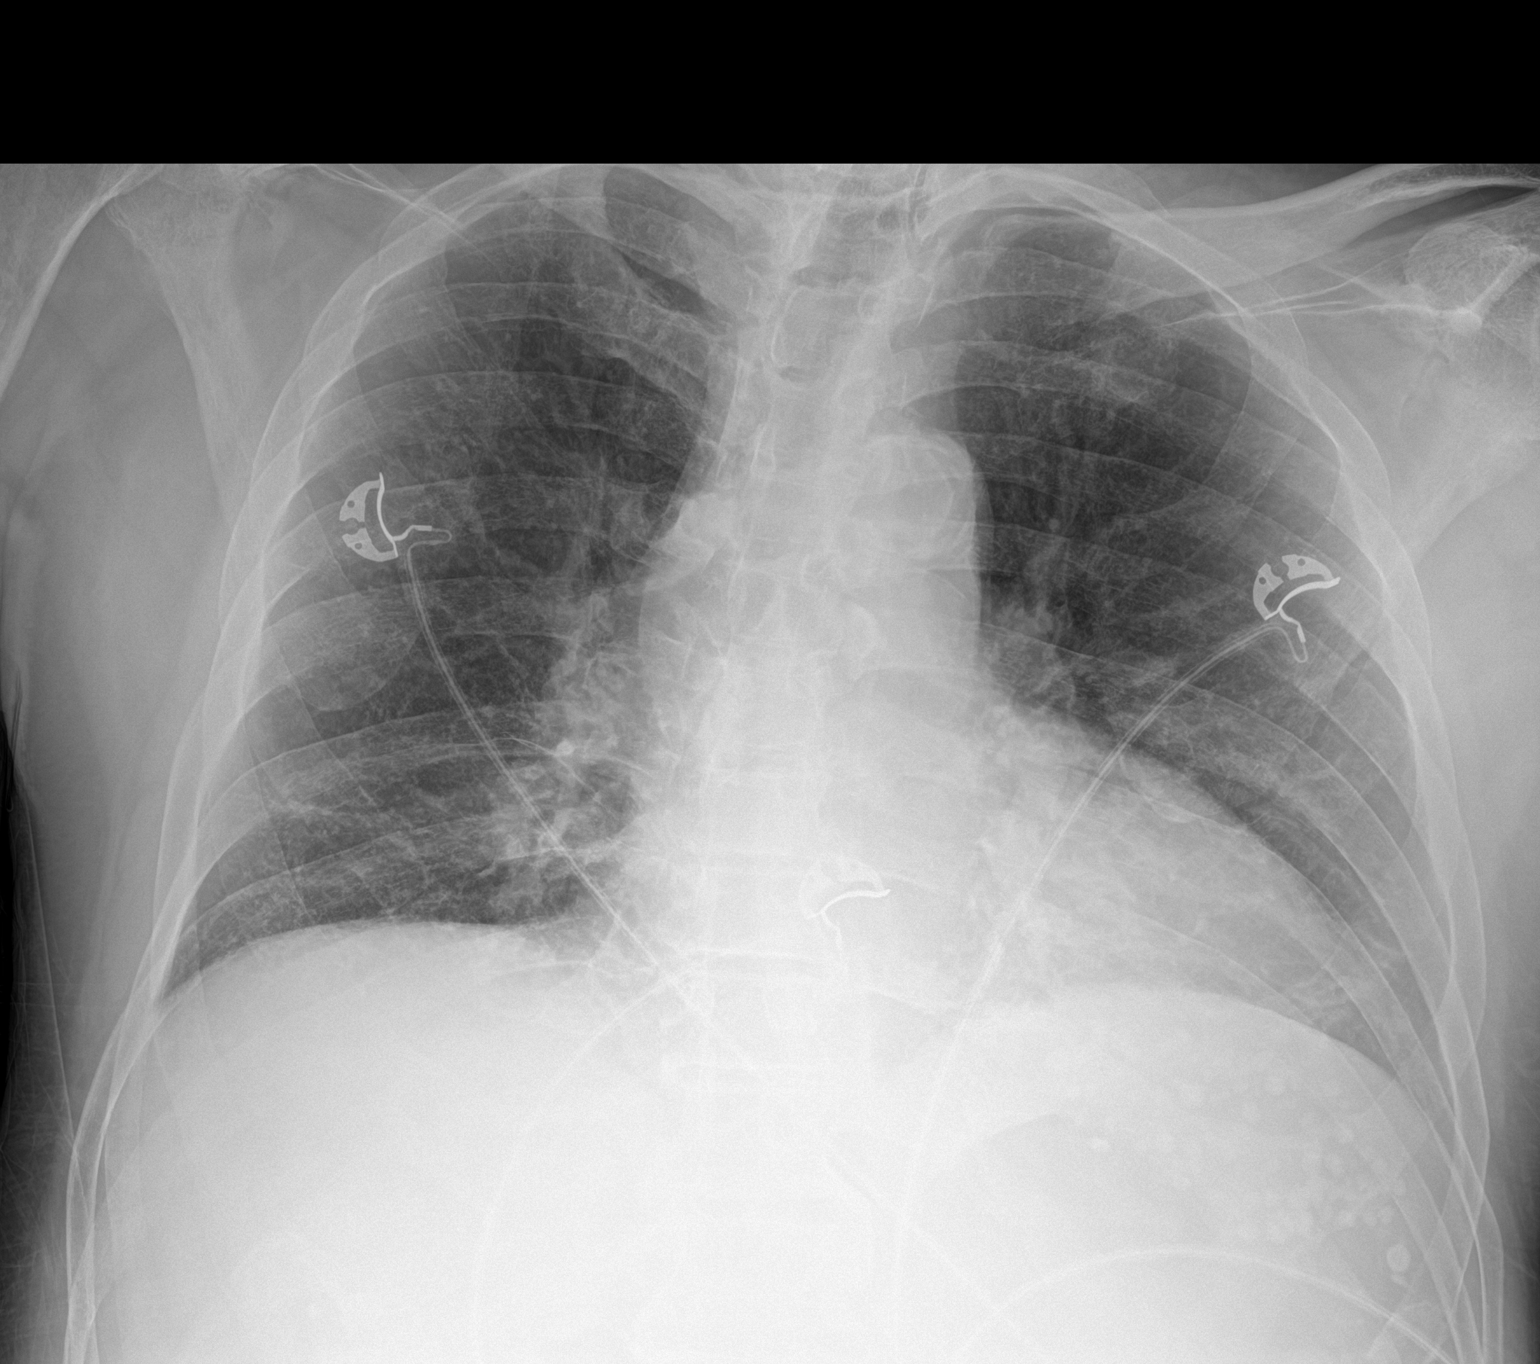

[2 of 2 positions shown; findings below may reference images not displayed]

FINDINGS: The lungs are clear. Heart size is upper normal. No pneumothorax or
pleural fluid. Aortic atherosclerosis is noted.
IMPRESSION: No acute disease.

## 2019-02-07 IMAGING — DX DG CHEST 1V PORT
1 series · 1 of 1 positions shown · non-contrast
Comparison: 11/14/2016

CLINICAL DATA: Status post intubation.  OG tube placement

EXAM:
PORTABLE CHEST 1 VIEW

[chest ap]
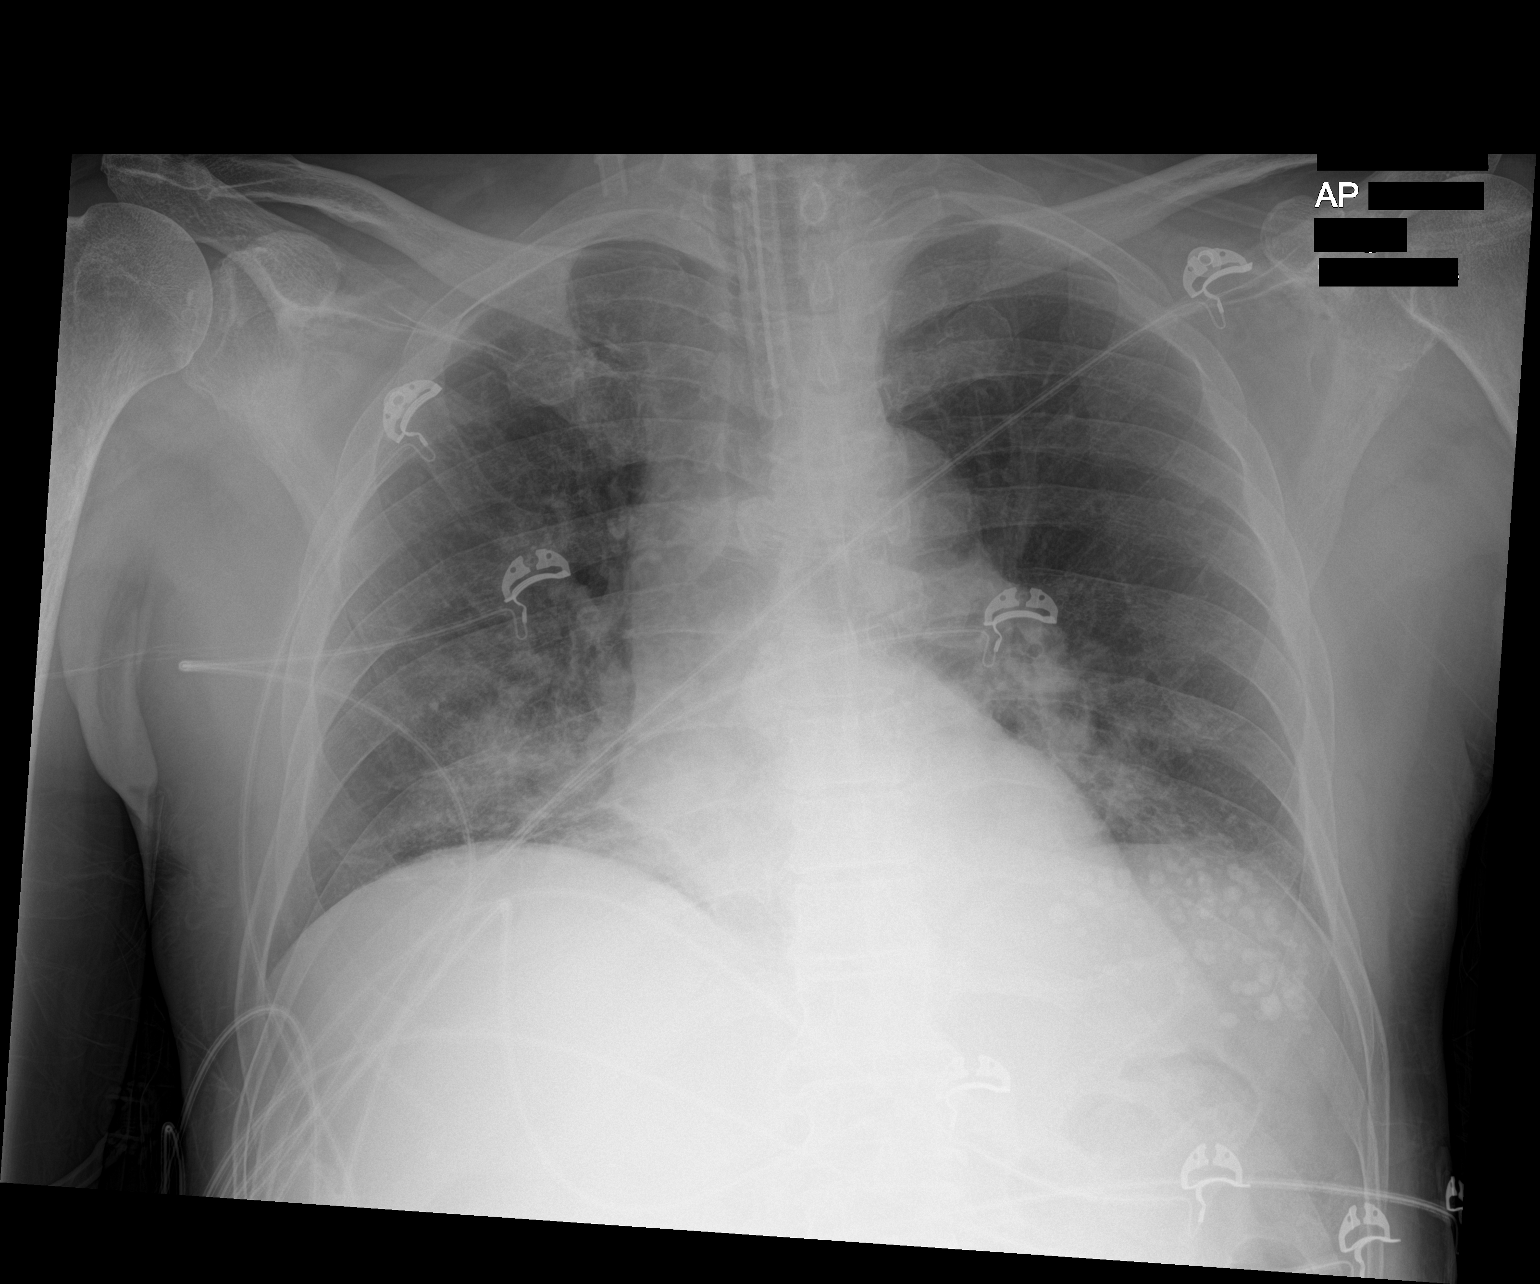

[1 of 1 positions shown; findings below may reference images not displayed]

FINDINGS: ET tube tip is above the carina. An enteric tube is not visualized.
Normal heart size. No pleural effusion or edema. Bilateral lower
lobe airspace opacities are new from previous exam.
IMPRESSION: 1. ET tube tip is in satisfactory position above the carina.
2. Nonvisualization of enteric tube.
3. Bilateral lower lobe airspace opacities.

## 2019-02-09 IMAGING — DX DG ABDOMEN 1V
1 series · 1 of 1 positions shown · non-contrast
Comparison: Radiographs March 12, 2017.

CLINICAL DATA: Abdominal distension.

EXAM:
ABDOMEN - 1 VIEW

[abdomen kub]
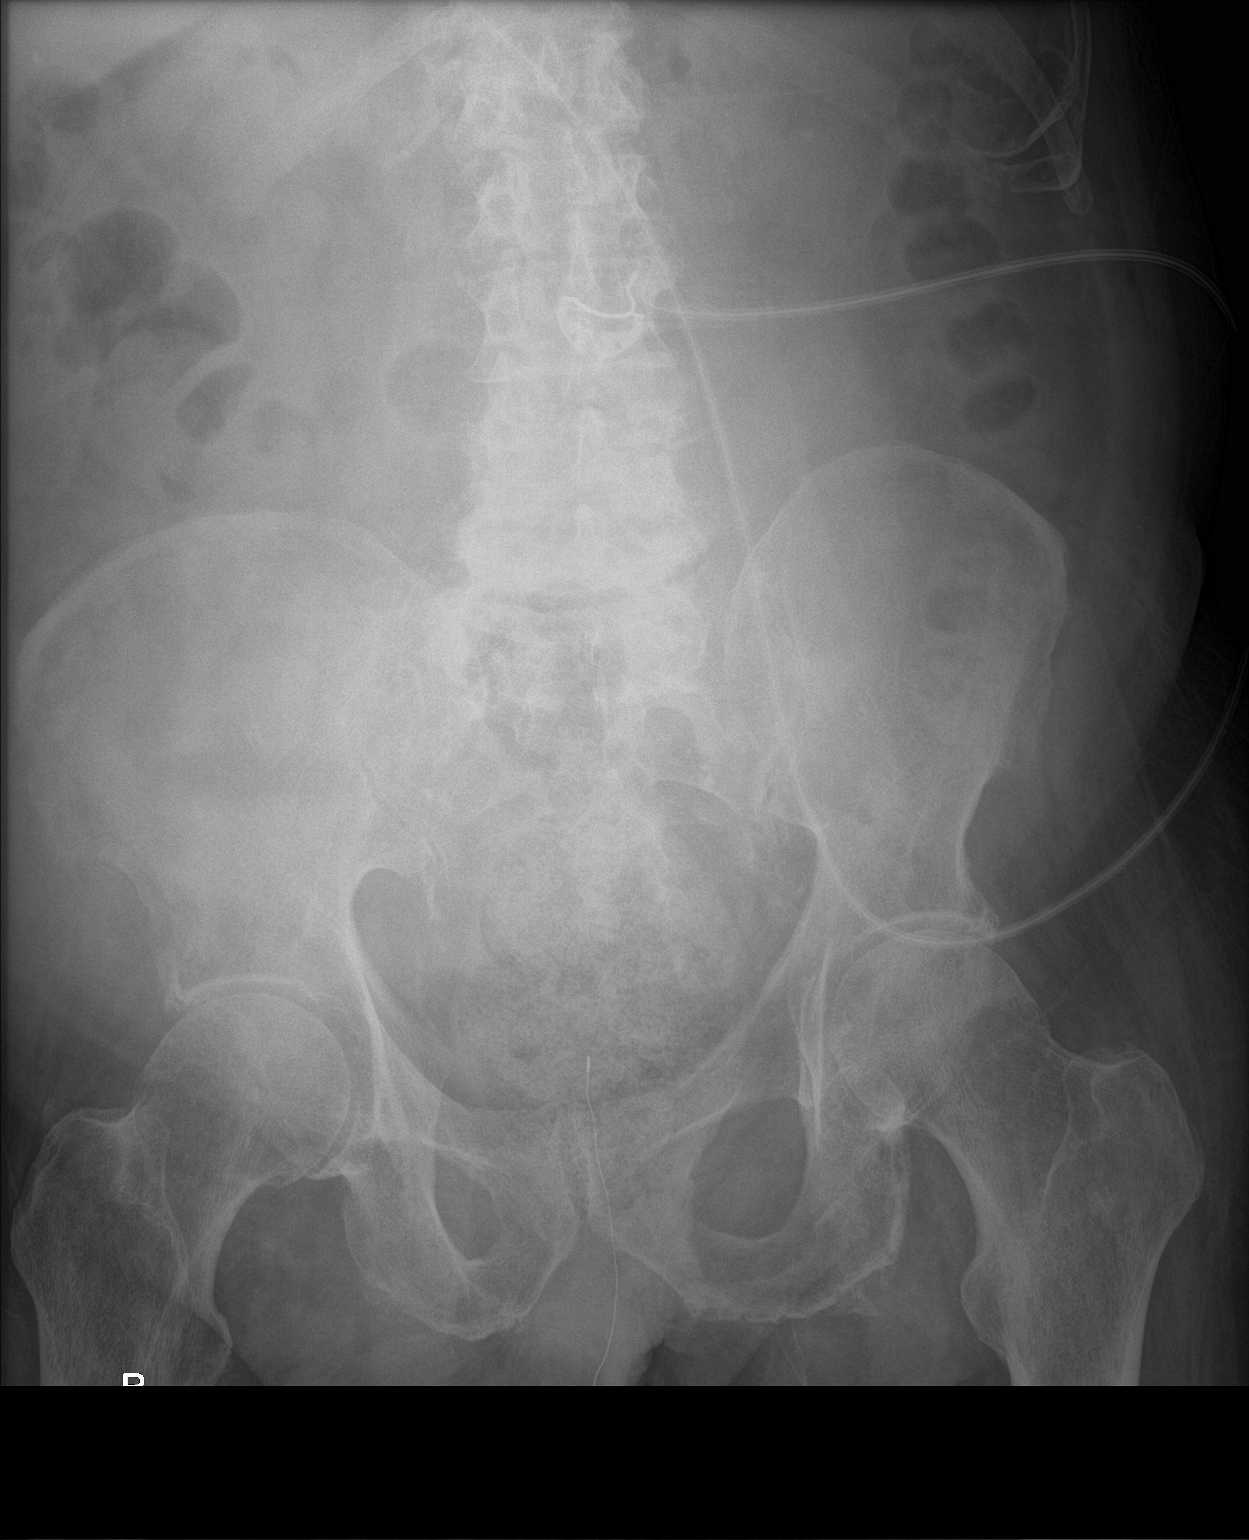

[1 of 1 positions shown; findings below may reference images not displayed]

FINDINGS: The bowel gas pattern is normal. Large amount of stool is noted in
the rectum. Distal tip of nasogastric tube is seen projected over
distal stomach. No abnormal calcifications are noted.
IMPRESSION: No evidence of bowel obstruction or ileus. Large amount of stool is
noted in the rectum.

## 2019-02-11 IMAGING — DX DG CHEST 1V PORT
1 series · 1 of 1 positions shown · non-contrast
Comparison: 03/13/2017

CLINICAL DATA: Respiratory failure.

EXAM:
PORTABLE CHEST 1 VIEW

[chest ap]
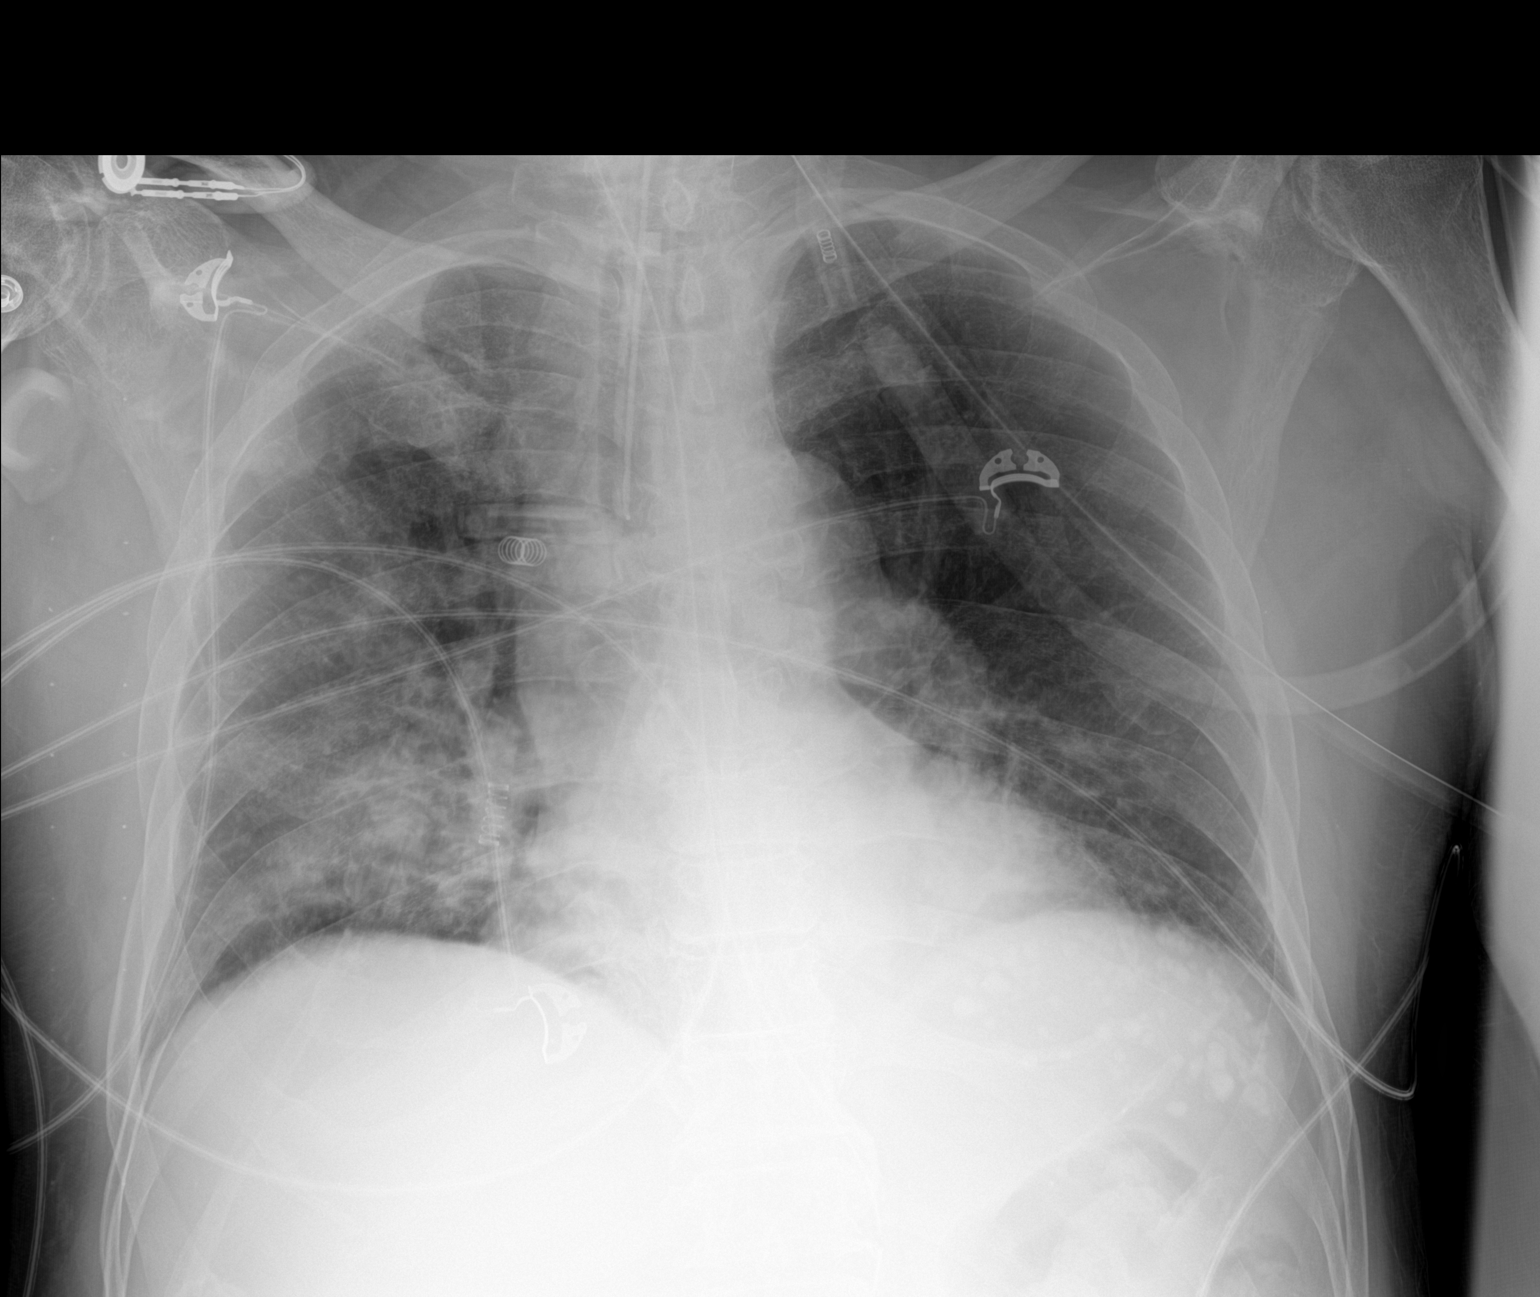

[1 of 1 positions shown; findings below may reference images not displayed]

FINDINGS: Endotracheal tube terminates 2.5 cm above the carina.

Cardiomediastinal silhouette is normal. Mediastinal contours appear
intact.

There is no evidence of pneumothorax. Slight worsening and patchy
right lung alveolar and interstitial pulmonary opacities. Milder
peribronchial airspace consolidation in the left lower lobe.

Osseous structures are without acute abnormality. Soft tissues are
grossly normal.
IMPRESSION: Slight worsening in the patchy right alveolar and interstitial
pulmonary infiltrates.

More subtle peribronchial airspace disease in the left lower lobe.

## 2019-02-12 IMAGING — DX DG CHEST 1V PORT
1 series · 1 of 1 positions shown · non-contrast
Comparison: 03/15/2017

CLINICAL DATA: Ventilator

EXAM:
PORTABLE CHEST 1 VIEW

[chest ap]
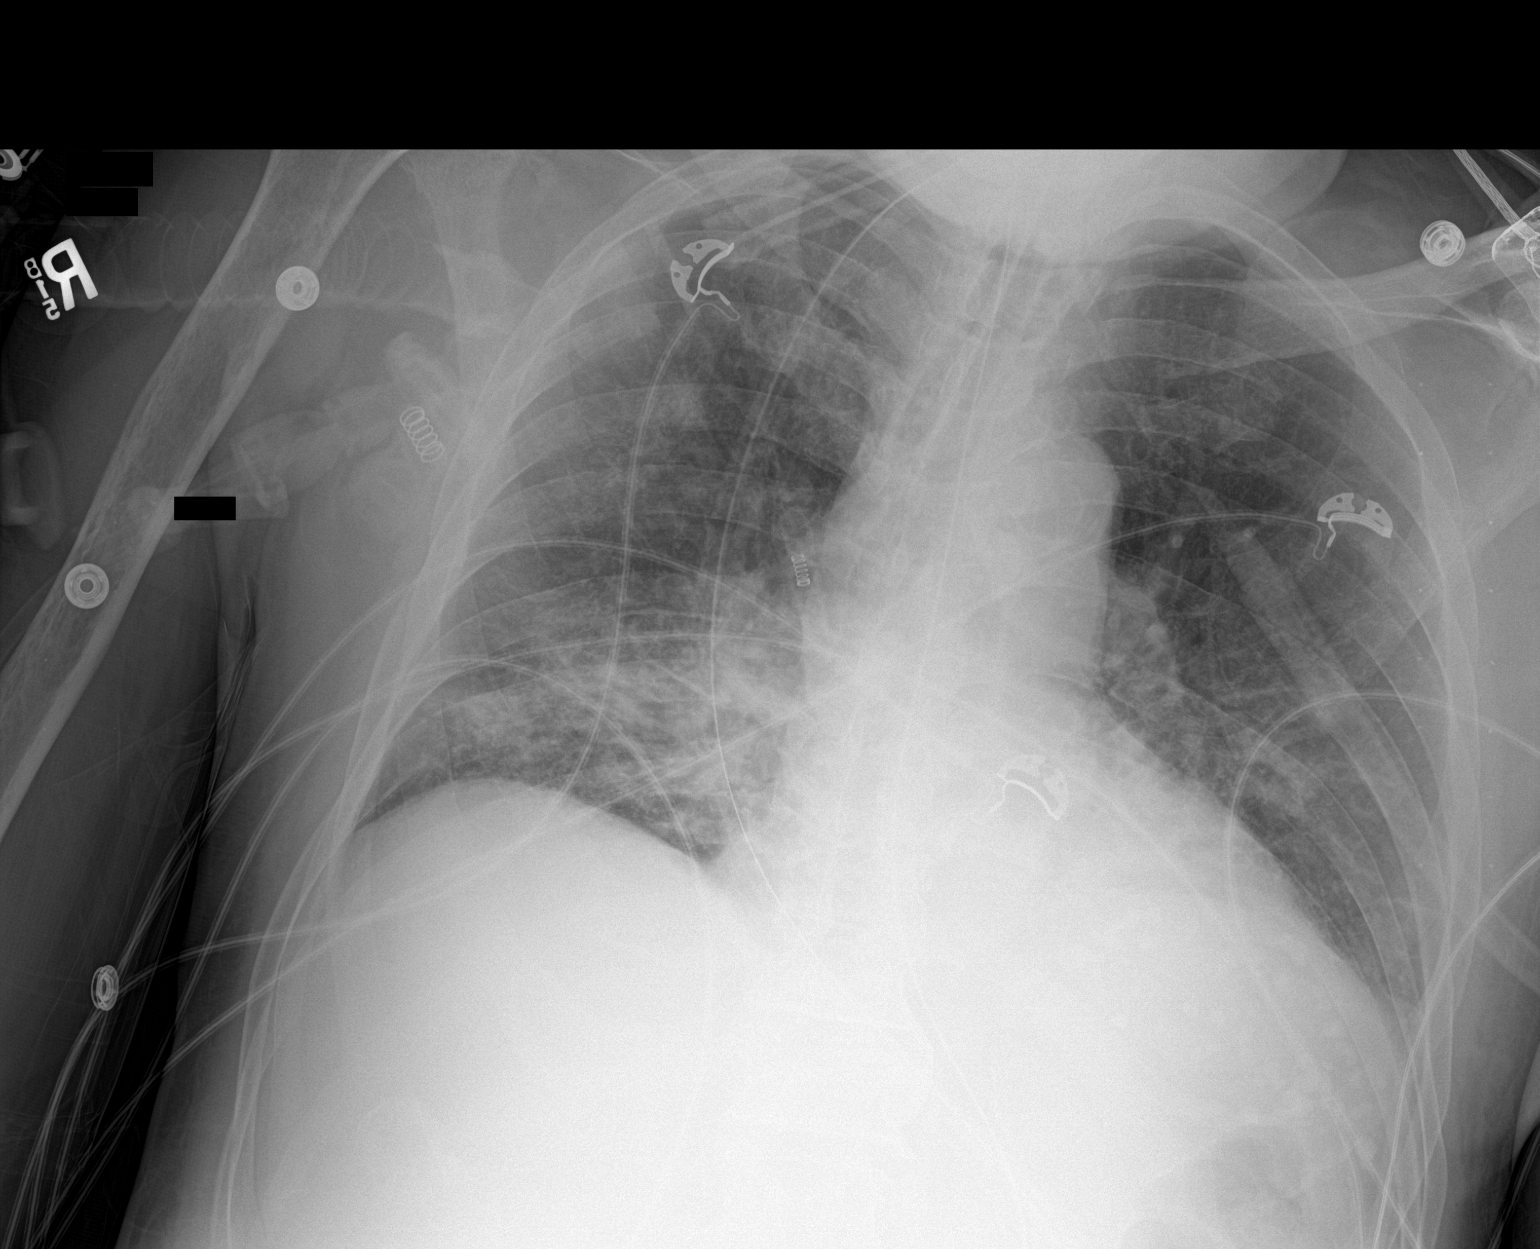

[1 of 1 positions shown; findings below may reference images not displayed]

FINDINGS: Endotracheal tube and NG tube are unchanged. Patchy bilateral lower
lobe airspace opacities are noted, right greater than left, not
significantly changed. Heart is borderline in size. No effusions or
acute bony abnormality.
IMPRESSION: Patchy bilateral lower lobe airspace opacities, right greater than
left concerning for pneumonia. No real change.
